# Patient Record
Sex: Female | Born: 1955
Health system: Southern US, Community
[De-identification: ages and names within clinical notes are randomized; demographics above are authoritative.]

## PROBLEM LIST (undated history)

## (undated) DIAGNOSIS — J40 Bronchitis, not specified as acute or chronic: Secondary | ICD-10-CM

## (undated) DIAGNOSIS — M1990 Unspecified osteoarthritis, unspecified site: Secondary | ICD-10-CM

## (undated) DIAGNOSIS — I10 Essential (primary) hypertension: Secondary | ICD-10-CM

## (undated) DIAGNOSIS — G4730 Sleep apnea, unspecified: Secondary | ICD-10-CM

## (undated) DIAGNOSIS — T7840XA Allergy, unspecified, initial encounter: Secondary | ICD-10-CM

## (undated) DIAGNOSIS — J45909 Unspecified asthma, uncomplicated: Secondary | ICD-10-CM

## (undated) DIAGNOSIS — F419 Anxiety disorder, unspecified: Secondary | ICD-10-CM

## (undated) DIAGNOSIS — K219 Gastro-esophageal reflux disease without esophagitis: Secondary | ICD-10-CM

## (undated) DIAGNOSIS — D649 Anemia, unspecified: Secondary | ICD-10-CM

## (undated) HISTORY — DX: Anemia, unspecified: D64.9

## (undated) HISTORY — PX: ABDOMINAL HYSTERECTOMY: SHX81

## (undated) HISTORY — PX: OTHER SURGICAL HISTORY: SHX169

## (undated) HISTORY — PX: BREAST SURGERY: SHX581

## (undated) HISTORY — PX: DILATION AND CURETTAGE OF UTERUS: SHX78

## (undated) HISTORY — PX: COLONOSCOPY: SHX174

## (undated) HISTORY — DX: Allergy, unspecified, initial encounter: T78.40XA

## (undated) HISTORY — PX: IUD REMOVAL: SHX5392

## (undated) HISTORY — PX: TONSILLECTOMY: SUR1361

## (undated) HISTORY — PX: CHOLECYSTECTOMY: SHX55

---

## 1996-02-26 HISTORY — PX: GASTRIC BYPASS: SHX52

## 2016-05-30 ENCOUNTER — Encounter (INDEPENDENT_AMBULATORY_CARE_PROVIDER_SITE_OTHER): Payer: Self-pay | Admitting: Physician Assistant

## 2016-05-30 ENCOUNTER — Ambulatory Visit (INDEPENDENT_AMBULATORY_CARE_PROVIDER_SITE_OTHER): Payer: Medicare Other | Admitting: Physician Assistant

## 2016-05-30 VITALS — BP 134/83 | HR 80 | Temp 98.0°F | Ht 60.24 in | Wt 194.2 lb

## 2016-05-30 DIAGNOSIS — F4310 Post-traumatic stress disorder, unspecified: Secondary | ICD-10-CM | POA: Insufficient documentation

## 2016-05-30 DIAGNOSIS — I10 Essential (primary) hypertension: Secondary | ICD-10-CM

## 2016-05-30 DIAGNOSIS — J3501 Chronic tonsillitis: Secondary | ICD-10-CM | POA: Insufficient documentation

## 2016-05-30 DIAGNOSIS — G8929 Other chronic pain: Secondary | ICD-10-CM

## 2016-05-30 DIAGNOSIS — Z9089 Acquired absence of other organs: Secondary | ICD-10-CM

## 2016-05-30 DIAGNOSIS — M542 Cervicalgia: Secondary | ICD-10-CM

## 2016-05-30 DIAGNOSIS — D563 Thalassemia minor: Secondary | ICD-10-CM | POA: Insufficient documentation

## 2016-05-30 DIAGNOSIS — G5600 Carpal tunnel syndrome, unspecified upper limb: Secondary | ICD-10-CM | POA: Insufficient documentation

## 2016-05-30 DIAGNOSIS — M25562 Pain in left knee: Secondary | ICD-10-CM | POA: Diagnosis not present

## 2016-05-30 DIAGNOSIS — E0500 Thyrotoxicosis with diffuse goiter without thyrotoxic crisis or storm: Secondary | ICD-10-CM

## 2016-05-30 DIAGNOSIS — R7303 Prediabetes: Secondary | ICD-10-CM | POA: Insufficient documentation

## 2016-05-30 DIAGNOSIS — M1712 Unilateral primary osteoarthritis, left knee: Secondary | ICD-10-CM | POA: Insufficient documentation

## 2016-05-30 DIAGNOSIS — M7582 Other shoulder lesions, left shoulder: Secondary | ICD-10-CM | POA: Insufficient documentation

## 2016-05-30 DIAGNOSIS — B351 Tinea unguium: Secondary | ICD-10-CM | POA: Insufficient documentation

## 2016-05-30 HISTORY — DX: Acquired absence of other organs: Z90.89

## 2016-05-30 HISTORY — DX: Thyrotoxicosis with diffuse goiter without thyrotoxic crisis or storm: E05.00

## 2016-05-30 MED ORDER — LAMOTRIGINE 25 MG PO TABS: 25.0000 mg | ORAL_TABLET | Freq: Every day | ORAL | 1 refills | Status: DC

## 2016-05-30 MED ORDER — GABAPENTIN 300 MG PO CAPS: 300.0000 mg | ORAL_CAPSULE | Freq: Three times a day (TID) | ORAL | 3 refills | Status: DC

## 2016-05-30 MED ORDER — LOSARTAN POTASSIUM-HCTZ 100-25 MG PO TABS: 1.0000 | ORAL_TABLET | Freq: Every day | ORAL | 1 refills | Status: DC

## 2016-05-30 MED ORDER — PROCHLORPERAZINE MALEATE 10 MG PO TABS: 10.0000 mg | ORAL_TABLET | Freq: Four times a day (QID) | ORAL | 3 refills | Status: DC | PRN

## 2016-05-30 NOTE — Progress Notes (Signed)
New patient presents with hypertension concerns and knee pain=3 Pt states she needs a shot for her neck pain, she can not take it anymore  Pt has taken her blood pressure medication today

## 2016-05-30 NOTE — Progress Notes (Signed)
Subjective:  Patient ID: Cheryl Prince, female    DOB: 09-30-1955  Age: 61 y.o. MRN: [id number]  CC: cervicalgia and medication refill.  HPI Cheryl Prince is a 61 y.o. female with presents with cervicalgia. She is new to our practice and complains of neck pain, left knee pain, and needs refill of medication for PTSD and HTN. In regards to neck pain, feels dull ache, sensitive to the touch, radiates to left and right shoulder. Feels as though something is "peeling" the back of her neck. Did physical therapy for the last 3.5 years with water aerobics which helps relieve her pain drastically. Tylenol helps, takes PRN. Has taken for the last 5 days. Previous imaging showed the following:  XR C spine 2014 mild straightneing of the normal cewrvical ordosisis may reflect muscle spasm, no fracture seen.  XR L spine 2014 DDD and hypertrophic degenerative changes. Spondylolisthesis at several levels   MRI 05/2013 Reversal of the normal cervical lordosis, mild dgernative changes, disc bulge c6-c7 with annular tear, disc bulge or herniation C4-C5; incompletely imaged.  In regards to PTSD, says her previous psychiatrist has prescribed many different medications but she has only responded to Lamictal. Would like a refill of Lamictal and her HTN medications. Denies CP, SOB, HA, abdominal pain, f/c/n/v, rash, GI/GU sxs, suicidal ideation/intent.   *Review of her previous notes shows Tdap 01/26/2014 Zoster 10/07/2014 Colonoscopy 08/2012 normal.   Review of Systems  Constitutional: Negative for chills, fever and malaise/fatigue.  Eyes: Negative for blurred vision.  Respiratory: Negative for shortness of breath.   Cardiovascular: Negative for chest pain and palpitations.  Gastrointestinal: Negative for abdominal pain and nausea.  Genitourinary: Negative for dysuria and hematuria.  Musculoskeletal: Positive for back pain, joint pain and neck pain. Negative for myalgias.  Skin: Negative for rash.   Neurological: Negative for tingling and headaches.  Psychiatric/Behavioral: Negative for depression. The patient is not nervous/anxious.        PTSD    Objective:  BP 134/83 (BP Location: Right Arm, Patient Position: Sitting, Cuff Size: Normal)   Pulse 80   Temp 98 F (36.7 C) (Oral)   Ht 5' 0.24" (1.53 m)   Wt 194 lb 3.2 oz (88.1 kg)   SpO2 100%   BMI 37.63 kg/m   BP/Weight 05/30/2016  Systolic BP 134  Diastolic BP 83  Wt. (Lbs) 194.2  BMI 37.63      Physical Exam  Constitutional: She is oriented to person, place, and time.  Well developed, overweight, NAD, polite  HENT:  Head: Normocephalic and atraumatic.  Eyes: No scleral icterus.  Neck: Normal range of motion. Neck supple. No thyromegaly present.  Cardiovascular: Normal rate, regular rhythm and normal heart sounds.   Pulmonary/Chest: Effort normal and breath sounds normal.  Musculoskeletal: She exhibits no edema.  Mild bony hypertrophic changes of the left knee. Left knee with full aROM.  Neurological: She is alert and oriented to person, place, and time. No cranial nerve deficit. Coordination normal.  Skin: Skin is warm and dry. No rash noted. No erythema. No pallor.  Psychiatric: She has a normal mood and affect. Her behavior is normal. Thought content normal.  Vitals reviewed.    Assessment & Plan:   1. Cervicalgia - Ambulatory referral to Orthopedic Surgery - gabapentin (NEURONTIN) 300 MG capsule; Take 1 capsule (300 mg total) by mouth 3 (three) times daily.  Dispense: 90 capsule; Refill: 3  2. Chronic pain of left knee - Ambulatory referral to Orthopedics  3. Hypertension, unspecified type -  losartan-hydrochlorothiazide (HYZAAR) 100-25 MG tablet; Take 1 tablet by mouth daily.  Dispense: 90 tablet; Refill: 1  4. PTSD (post-traumatic stress disorder) - prochlorperazine (COMPAZINE) 10 MG tablet; Take 1 tablet (10 mg total) by mouth every 6 (six) hours as needed for nausea or vomiting.  Dispense: 30  tablet; Refill: 3 - lamoTRIgine (LAMICTAL) 25 MG tablet; Take 1 tablet (25 mg total) by mouth daily.  Dispense: 90 tablet; Refill: 1 - Ambulatory referral to Psychiatry   Meds ordered this encounter  Medications  . prochlorperazine (COMPAZINE) 10 MG tablet    Sig: Take 1 tablet (10 mg total) by mouth every 6 (six) hours as needed for nausea or vomiting.    Dispense:  30 tablet    Refill:  3    Order Specific Question:   Supervising Provider    Answer:   Quentin Angst [id number]  . losartan-hydrochlorothiazide (HYZAAR) 100-25 MG tablet    Sig: Take 1 tablet by mouth daily.    Dispense:  90 tablet    Refill:  1    Order Specific Question:   Supervising Provider    Answer:   Quentin Angst [id number]  . lamoTRIgine (LAMICTAL) 25 MG tablet    Sig: Take 1 tablet (25 mg total) by mouth daily.    Dispense:  90 tablet    Refill:  1    Order Specific Question:   Supervising Provider    Answer:   Quentin Angst [id number]  . gabapentin (NEURONTIN) 300 MG capsule    Sig: Take 1 capsule (300 mg total) by mouth 3 (three) times daily.    Dispense:  90 capsule    Refill:  3    Order Specific Question:   Supervising Provider    Answer:   Quentin Angst [[id number]]    Follow-up: Return in about 4 weeks (around 06/27/2016) for full physical.   Loletta Specter PA

## 2016-05-30 NOTE — Patient Instructions (Signed)
Osteoarthritis  Osteoarthritis is a type of arthritis that affects tissue that covers the ends of bones in joints (cartilage). Cartilage acts as a cushion between the bones and helps them move smoothly. Osteoarthritis results when cartilage in the joints gets worn down. Osteoarthritis is sometimes called "wear and tear" arthritis.  Osteoarthritis is the most common form of arthritis. It often occurs in older people. It is a condition that gets worse over time (a progressive condition). Joints that are most often affected by this condition are in:  · Fingers.  · Toes.  · Hips.  · Knees.  · Spine, including neck and lower back.    What are the causes?  This condition is caused by age-related wearing down of cartilage that covers the ends of bones.  What increases the risk?  The following factors may make you more likely to develop this condition:  · Older age.  · Being overweight or obese.  · Overuse of joints, such as in athletes.  · Past injury of a joint.  · Past surgery on a joint.  · Family history of osteoarthritis.    What are the signs or symptoms?  The main symptoms of this condition are pain, swelling, and stiffness in the joint. The joint may lose its shape over time. Small pieces of bone or cartilage may break off and float inside of the joint, which may cause more pain and damage to the joint. Small deposits of bone (osteophytes) may grow on the edges of the joint. Other symptoms may include:  · A grating or scraping feeling inside the joint when you move it.  · Popping or creaking sounds when you move.    Symptoms may affect one or more joints. Osteoarthritis in a major joint, such as your knee or hip, can make it painful to walk or exercise. If you have osteoarthritis in your hands, you might not be able to grip items, twist your hand, or control small movements of your hands and fingers (fine motor skills).  How is this diagnosed?  This condition may be diagnosed based on:  · Your medical history.  · A  physical exam.  · Your symptoms.  · X-rays of the affected joint(s).  · Blood tests to rule out other types of arthritis.    How is this treated?  There is no cure for this condition, but treatment can help to control pain and improve joint function. Treatment plans may include:  · A prescribed exercise program that allows for rest and joint relief. You may work with a physical therapist.  · A weight control plan.  · Pain relief techniques, such as:  ? Applying heat and cold to the joint.  ? Electric pulses delivered to nerve endings under the skin (transcutaneous electrical nerve stimulation, or TENS).  ? Massage.  ? Certain nutritional supplements.  · NSAIDs or prescription medicines to help relieve pain.  · Medicine to help relieve pain and inflammation (corticosteroids). This can be given by mouth (orally) or as an injection.  · Assistive devices, such as a brace, wrap, splint, specialized glove, or cane.  · Surgery, such as:  ? An osteotomy. This is done to reposition the bones and relieve pain or to remove loose pieces of bone and cartilage.  ? Joint replacement surgery. You may need this surgery if you have very bad (advanced) osteoarthritis.    Follow these instructions at home:  Activity   · Rest your affected joints as directed by your   health care provider.  · Do not drive or use heavy machinery while taking prescription pain medicine.  · Exercise as directed. Your health care provider or physical therapist may recommend specific types of exercise, such as:  ? Strengthening exercises. These are done to strengthen the muscles that support joints that are affected by arthritis. They can be performed with weights or with exercise bands to add resistance.  ? Aerobic activities. These are exercises, such as brisk walking or water aerobics, that get your heart pumping.  ? Range-of-motion activities. These keep your joints easy to move.  ? Balance and agility exercises.  Managing pain, stiffness, and swelling    · If directed, apply heat to the affected area as often as told by your health care provider. Use the heat source that your health care provider recommends, such as a moist heat pack or a heating pad.  ? If you have a removable assistive device, remove it as told by your health care provider.  ? Place a towel between your skin and the heat source. If your health care provider tells you to keep the assistive device on while you apply heat, place a towel between the assistive device and the heat source.  ? Leave the heat on for 20-30 minutes.  ? Remove the heat if your skin turns bright red. This is especially important if you are unable to feel pain, heat, or cold. You may have a greater risk of getting burned.  · If directed, put ice on the affected joint:  ? If you have a removable assistive device, remove it as told by your health care provider.  ? Put ice in a plastic bag.  ? Place a towel between your skin and the bag. If your health care provider tells you to keep the assistive device on during icing, place a towel between the assistive device and the bag.  ? Leave the ice on for 20 minutes, 2-3 times a day.  General instructions   · Take over-the-counter and prescription medicines only as told by your health care provider.  · Maintain a healthy weight. Follow instructions from your health care provider for weight control. These may include dietary restrictions.  · Do not use any products that contain nicotine or tobacco, such as cigarettes and e-cigarettes. These can delay bone healing. If you need help quitting, ask your health care provider.  · Use assistive devices as directed by your health care provider.  · Keep all follow-up visits as told by your health care provider. This is important.  Where to find more information:  · National Institute of Arthritis and Musculoskeletal and Skin Diseases: www.niams.nih.gov  · National Institute on Aging: www.nia.nih.gov  · American College of Rheumatology:  www.rheumatology.org  Contact a health care provider if:  · Your skin turns red.  · You develop a rash.  · You have pain that gets worse.  · You have a fever along with joint or muscle aches.  Get help right away if:  · You lose a lot of weight.  · You suddenly lose your appetite.  · You have night sweats.  Summary  · Osteoarthritis is a type of arthritis that affects tissue covering the ends of bones in joints (cartilage).  · This condition is caused by age-related wearing down of cartilage that covers the ends of bones.  · The main symptom of this condition is pain, swelling, and stiffness in the joint.  · There is no cure for this   condition, but treatment can help to control pain and improve joint function.  This information is not intended to replace advice given to you by your health care provider. Make sure you discuss any questions you have with your health care provider.  Document Released: 02/11/2005 Document Revised: 10/16/2015 Document Reviewed: 10/16/2015  Elsevier Interactive Patient Education © 2017 [employer]

## 2016-06-07 ENCOUNTER — Ambulatory Visit (INDEPENDENT_AMBULATORY_CARE_PROVIDER_SITE_OTHER): Payer: Medicare Other | Admitting: Orthopedic Surgery

## 2016-06-07 ENCOUNTER — Encounter (INDEPENDENT_AMBULATORY_CARE_PROVIDER_SITE_OTHER): Payer: Self-pay | Admitting: Orthopedic Surgery

## 2016-06-07 DIAGNOSIS — M25512 Pain in left shoulder: Secondary | ICD-10-CM | POA: Diagnosis not present

## 2016-06-07 DIAGNOSIS — M1712 Unilateral primary osteoarthritis, left knee: Secondary | ICD-10-CM

## 2016-06-07 DIAGNOSIS — G8929 Other chronic pain: Secondary | ICD-10-CM

## 2016-06-07 DIAGNOSIS — M542 Cervicalgia: Secondary | ICD-10-CM | POA: Diagnosis not present
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
## 2016-06-18 ENCOUNTER — Telehealth (INDEPENDENT_AMBULATORY_CARE_PROVIDER_SITE_OTHER): Payer: Self-pay | Admitting: Physician Assistant

## 2016-06-18 NOTE — Telephone Encounter (Signed)
I have filled out the paperwork for the handicap placard. Please call patient to pick up form.

## 2016-06-18 NOTE — Telephone Encounter (Signed)
FWD to PCP. Tempestt S Roberts, CMA  

## 2016-06-18 NOTE — Telephone Encounter (Signed)
Patient dropped off DMV form to be filled out. Please call patient with any question or when ready

## 2016-06-19 NOTE — Telephone Encounter (Signed)
Patient informed. Adaleena Mooers S Hulda Reddix, CMA  

## 2016-06-24 ENCOUNTER — Encounter (INDEPENDENT_AMBULATORY_CARE_PROVIDER_SITE_OTHER): Payer: Medicare Other | Admitting: Physical Medicine and Rehabilitation

## 2016-06-26 ENCOUNTER — Ambulatory Visit (INDEPENDENT_AMBULATORY_CARE_PROVIDER_SITE_OTHER): Payer: Medicare Other

## 2016-06-26 ENCOUNTER — Ambulatory Visit (INDEPENDENT_AMBULATORY_CARE_PROVIDER_SITE_OTHER): Payer: Medicare Other | Admitting: Physical Medicine and Rehabilitation

## 2016-06-26 ENCOUNTER — Encounter (INDEPENDENT_AMBULATORY_CARE_PROVIDER_SITE_OTHER): Payer: Self-pay | Admitting: Physical Medicine and Rehabilitation

## 2016-06-26 VITALS — BP 119/78 | HR 72

## 2016-06-26 DIAGNOSIS — M5412 Radiculopathy, cervical region: Secondary | ICD-10-CM

## 2016-06-26 MED ORDER — LIDOCAINE HCL (PF) 1 % IJ SOLN
2.0000 mL | Freq: Once | INTRAMUSCULAR | Status: AC
  Administered 2016-06-26: 2 mL

## 2016-06-26 MED ORDER — IIOPAMIDOL (ISOVUE-250) INJECTION 51%
3.0000 mL | Freq: Once | INTRAVENOUS | Status: AC
  Administered 2016-06-26: 3 mL
  Filled 2016-06-26: qty 50

## 2016-06-26 MED ORDER — METHYLPREDNISOLONE ACETATE 80 MG/ML IJ SUSP
80.0000 mg | Freq: Once | INTRAMUSCULAR | Status: AC
  Administered 2016-06-26: 80 mg

## 2016-06-26 NOTE — Progress Notes (Signed)
Cheryl Prince - 60 y.o. female MRN [id number]  Date of birth: 07/01/1955  Office Visit Note: Visit Date: 06/26/2016 PCP: Loletta Specter, PA-C Referred by: Loletta Specter, PA-C  Subjective: Chief Complaint  Patient presents with  . Neck - Pain   HPI: Cheryl Prince is a very pleasant 60 year old female with multiple medical problems and multiple orthopedic complaints who is seeing Dr. August Saucer mainly for her knees but is been having some neck and shoulder pain bilaterally. This is a chronic problem for her. She had an MRI from 2015 which is reviewed below. She is status post bilateral carpal tunnel release. Electrodiagnostic studies did not reveal overt radiculopathy of the time.    ROS Otherwise per HPI.  Assessment & Plan: Visit Diagnoses:  1. Cervical radiculopathy     Plan: Findings:  Left C7-T1 intralaminar epidural steroid injection. The patient had mild anterior chest pain at about the midpoint of putting in the injectate. We did slow the injectate at that point and she did extremely well after that.    Meds & Orders:  Meds ordered this encounter  Medications  . lidocaine (PF) (XYLOCAINE) 1 % injection 2 mL  . iopamidol (ISOVUE-250) 51 % injection 3 mL  . methylPREDNISolone acetate (DEPO-MEDROL) injection 80 mg    Orders Placed This Encounter  Procedures  . XR C-ARM NO REPORT  . Epidural Steroid injection    Follow-up: Return if symptoms worsen or fail to improve, for Dr. August Saucer.   Procedures: No procedures performed  Cervical Epidural Steroid Injection - Interlaminar Approach with Fluoroscopic Guidance  Patient: Cheryl Prince      Date of Birth: May 26, 1955 MRN: [id number] PCP: Loletta Specter, PA-C      Visit Date: 06/26/2016   Universal Protocol:    Date/Time: 06/27/1810:59 PM  Consent Given By: the patient  Position: PRONE  Additional Comments: Vital signs were monitored before and after the procedure. Patient was prepped and draped in the  usual sterile fashion. The correct patient, procedure, and site was verified.   Injection Procedure Details:  Procedure Site One Meds Administered: No orders of the defined types were placed in this encounter.    Laterality: Left  Location/Site:  C7-T1  Needle size: 20 G  Needle type: Touhy  Needle Placement: Paramedian epidural space  Findings:  -Contrast Used: 1 mL iohexol 180 mg iodine/mL   -Comments: Excellent flow of contrast into the epidural space.  Procedure Details: Using a paramedian approach from the side mentioned above, the region overlying the inferior lamina was localized under fluoroscopic visualization and the soft tissues overlying this structure were infiltrated with 4 ml. of 1% Lidocaine without Epinephrine. A # 20 gauge, Tuohy needle was inserted into the epidural space using a paramedian approach.  The epidural space was localized using loss of resistance along with lateral and contralateral oblique bi-planar fluoroscopic views.  After negative aspirate for air, blood, and CSF, a 2 ml. volume of Isovue-250 was injected into the epidural space and the flow of contrast was observed. Radiographs were obtained for documentation purposes.   The injectate was administered into the level noted above.  Additional Comments:  The patient tolerated the procedure well Dressing: Band-Aid    Post-procedure details: Patient was observed during the procedure. Post-procedure instructions were reviewed.  Patient left the clinic in stable condition.      Clinical History: Cervical spine MRI dated 06/03/2013 completed in [place] By report this shows reversal of normal lordosis. It C2-3 there is right-sided  facet joint hypertrophy and arthropathy. At C 4-5 there is more left-sided facet joint hypertrophy. At C6-7 there is disc annular tear but no focal stenosis or nerve compression.  She reports that she has never smoked. She has never used smokeless tobacco. No  results for input(s): HGBA1C, LABURIC in the last 8760 hours.  Objective:  VS:  HT:    WT:   BMI:     BP:119/78  HR:72bpm  TEMP: ( )  RESP:99 % Physical Exam  Musculoskeletal:  Patient has painful range of motion with extension rotation with a negative Spurling's test bilaterally and negative Hoffmann's test bilaterally. She has good distal strength in the hands bilaterally.    Ortho Exam Imaging: Xr C-arm No Report  Result Date: 06/26/2016 Please see Notes or Procedures tab for imaging impression.   Past Medical/Family/Surgical/Social History: Medications & Allergies reviewed per EMR Patient Active Problem List   Diagnosis Date Noted  . PTSD (post-traumatic stress disorder) 05/30/2016  . Alpha thalassemia trait 05/30/2016  . Hypertension 05/30/2016  . Rotator cuff tendonitis, left 05/30/2016  . Osteoarthritis of left knee 05/30/2016  . Carpal tunnel syndrome 05/30/2016  . Prediabetes 05/30/2016  . Onychomycosis 05/30/2016  . S/P tonsillectomy 05/30/2016  . Goiter, toxic diffuse 05/30/2016  . Chronic tonsillitis 05/30/2016   No past medical history on file. No family history on file. No past surgical history on file. Social History   Occupational History  . Not on file.   Social History Main Topics  . Smoking status: Never Smoker  . Smokeless tobacco: Never Used  . Alcohol use Not on file  . Drug use: Unknown  . Sexual activity: Not on file

## 2016-06-26 NOTE — Progress Notes (Deleted)
Neck and left shoulder pain for several years. Constant pain. Spasms. Numbness in fingers. Wakes with pain. Feels like head is too heavy for her neck.

## 2016-06-26 NOTE — Procedures (Signed)
Cervical Epidural Steroid Injection - Interlaminar Approach with Fluoroscopic Guidance  Patient: Cheryl Prince      Date of Birth: 07-May-1955 MRN: [id number] PCP: Loletta Specter, PA-C      Visit Date: 06/26/2016   Universal Protocol:    Date/Time: 06/27/1810:59 PM  Consent Given By: the patient  Position: PRONE  Additional Comments: Vital signs were monitored before and after the procedure. Patient was prepped and draped in the usual sterile fashion. The correct patient, procedure, and site was verified.   Injection Procedure Details:  Procedure Site One Meds Administered: No orders of the defined types were placed in this encounter.    Laterality: Left  Location/Site:  C7-T1  Needle size: 20 G  Needle type: Touhy  Needle Placement: Paramedian epidural space  Findings:  -Contrast Used: 1 mL iohexol 180 mg iodine/mL   -Comments: Excellent flow of contrast into the epidural space.  Procedure Details: Using a paramedian approach from the side mentioned above, the region overlying the inferior lamina was localized under fluoroscopic visualization and the soft tissues overlying this structure were infiltrated with 4 ml. of 1% Lidocaine without Epinephrine. A # 20 gauge, Tuohy needle was inserted into the epidural space using a paramedian approach.  The epidural space was localized using loss of resistance along with lateral and contralateral oblique bi-planar fluoroscopic views.  After negative aspirate for air, blood, and CSF, a 2 ml. volume of Isovue-250 was injected into the epidural space and the flow of contrast was observed. Radiographs were obtained for documentation purposes.   The injectate was administered into the level noted above.  Additional Comments:  The patient tolerated the procedure well Dressing: Band-Aid    Post-procedure details: Patient was observed during the procedure. Post-procedure instructions were reviewed.  Patient left the clinic  in stable condition.

## 2016-06-26 NOTE — Patient Instructions (Signed)

## 2016-07-05 ENCOUNTER — Telehealth (INDEPENDENT_AMBULATORY_CARE_PROVIDER_SITE_OTHER): Payer: Self-pay | Admitting: Physician Assistant
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
## 2016-07-08 NOTE — Telephone Encounter (Signed)
Left message for patient with details that letter will not be given. Maryjean Mornempestt S Roberts, CMA

## 2016-07-08 NOTE — Telephone Encounter (Signed)
FWD to PCP. Cheryl Prince S Cheryl Prince, CMA  
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
## 2016-07-18 ENCOUNTER — Ambulatory Visit (HOSPITAL_COMMUNITY): Payer: Medicare Other | Admitting: Psychiatry

## 2016-08-05 ENCOUNTER — Ambulatory Visit (HOSPITAL_COMMUNITY): Payer: Medicare Other | Admitting: Psychiatry

## 2016-10-01 ENCOUNTER — Encounter (INDEPENDENT_AMBULATORY_CARE_PROVIDER_SITE_OTHER): Payer: Self-pay

## 2016-10-01 ENCOUNTER — Ambulatory Visit (INDEPENDENT_AMBULATORY_CARE_PROVIDER_SITE_OTHER): Payer: Medicare Other | Admitting: Psychiatry

## 2016-10-01 ENCOUNTER — Encounter (HOSPITAL_COMMUNITY): Payer: Self-pay | Admitting: Psychiatry

## 2016-10-01 VITALS — BP 137/76 | HR 72 | Ht 61.75 in | Wt 203.3 lb

## 2016-10-01 DIAGNOSIS — F4310 Post-traumatic stress disorder, unspecified: Secondary | ICD-10-CM

## 2016-10-01 DIAGNOSIS — F1010 Alcohol abuse, uncomplicated: Secondary | ICD-10-CM | POA: Diagnosis not present

## 2016-10-01 DIAGNOSIS — F319 Bipolar disorder, unspecified: Secondary | ICD-10-CM | POA: Diagnosis not present

## 2016-10-01 DIAGNOSIS — F1910 Other psychoactive substance abuse, uncomplicated: Secondary | ICD-10-CM

## 2016-10-01 DIAGNOSIS — Z818 Family history of other mental and behavioral disorders: Secondary | ICD-10-CM | POA: Diagnosis not present

## 2016-10-01 DIAGNOSIS — G4700 Insomnia, unspecified: Secondary | ICD-10-CM | POA: Diagnosis not present

## 2016-10-01 MED ORDER — HYDROXYZINE PAMOATE 25 MG PO CAPS: 25.0000 mg | ORAL_CAPSULE | Freq: Every evening | ORAL | 0 refills | Status: DC | PRN

## 2016-10-01 MED ORDER — NALTREXONE HCL 50 MG PO TABS: 50.0000 mg | ORAL_TABLET | ORAL | 0 refills | Status: DC

## 2016-10-01 MED ORDER — LAMOTRIGINE 25 MG PO TABS: ORAL_TABLET | ORAL | 0 refills | Status: DC
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
## 2016-10-09 ENCOUNTER — Ambulatory Visit (HOSPITAL_COMMUNITY)
Admission: EM | Admit: 2016-10-09 | Discharge: 2016-10-09 | Disposition: A | Discharge status: Home / Self Care | Payer: Medicare Other | Attending: Family Medicine | Admitting: Family Medicine

## 2016-10-09 ENCOUNTER — Encounter (HOSPITAL_COMMUNITY): Payer: Self-pay | Admitting: Emergency Medicine

## 2016-10-09 DIAGNOSIS — J0100 Acute maxillary sinusitis, unspecified: Secondary | ICD-10-CM

## 2016-10-09 DIAGNOSIS — R05 Cough: Secondary | ICD-10-CM | POA: Diagnosis not present

## 2016-10-09 HISTORY — DX: Bronchitis, not specified as acute or chronic: J40

## 2016-10-09 MED ORDER — FLUTICASONE PROPIONATE 50 MCG/ACT NA SUSP: 1.0000 | Freq: Every day | NASAL | 5 refills | Status: DC

## 2016-10-09 MED ORDER — DOXYCYCLINE HYCLATE 100 MG PO TABS: 100.0000 mg | ORAL_TABLET | Freq: Two times a day (BID) | ORAL | 0 refills | Status: DC

## 2016-10-09 NOTE — ED Provider Notes (Signed)
[facility]    CSN: [id number] Arrival date & time: 10/09/16  1049     History   Chief Complaint Chief Complaint  Patient presents with  . Cough    HPI Cheryl Prince is a 61 y.o. female.   HPI   URI  Has been sick for about 1 week. Started out with congestion and maxillary sinus tenderness. Had sore throat about 4 days. Patient now has cough and congestion, noted some phelgm. Patient states that her tmax was 101 on Saturday.  Patient was feeling pretty good yesterday. However feels like she had cough and congestion start last night. Patient states that right ear was hurting at one point earlier last week, now improved  Medications tried: Albuterol, cold medicine, tylenol, chest vicks, humdifier  Sick contacts: None  No hx of asthma, COPD, smoked till 1986   ROS see HPI Smoking Status noted    Past Medical History:  Diagnosis Date  . Bronchitis     Patient Active Problem List   Diagnosis Date Noted  . PTSD (post-traumatic stress disorder) 05/30/2016  . Alpha thalassemia trait 05/30/2016  . Hypertension 05/30/2016  . Rotator cuff tendonitis, left 05/30/2016  . Osteoarthritis of left knee 05/30/2016  . Carpal tunnel syndrome 05/30/2016  . Prediabetes 05/30/2016  . Onychomycosis 05/30/2016  . S/P tonsillectomy 05/30/2016  . Goiter, toxic diffuse 05/30/2016  . Chronic tonsillitis 05/30/2016    Past Surgical History:  Procedure Laterality Date  . ABDOMINAL HYSTERECTOMY    . BREAST SURGERY    . CHOLECYSTECTOMY      OB History    No data available       Home Medications    Prior to Admission medications   Medication Sig Start Date End Date Taking? Authorizing Provider  doxycycline (VIBRA-TABS) 100 MG tablet Take 1 tablet (100 mg total) by mouth 2 (two) times daily. 10/09/16   Saniya Tranchina, Antionette PolesAsiyah Zahra, MD  FLOVENT HFA 110 MCG/ACT inhaler INL 2 PUFFS PO BID 07/16/16   [provider]  fluticasone (FLONASE) 50 MCG/ACT nasal spray Place 1  spray into both nostrils daily. 1 spray in each nostril every day 10/09/16   Berton BonMikell, Shantasia Hunnell Zahra, MD  gabapentin (NEURONTIN) 300 MG capsule Take 1 capsule (300 mg total) by mouth 3 (three) times daily. 05/30/16   Loletta SpecterGomez, Roger David, PA-C  hydrOXYzine (VISTARIL) 25 MG capsule Take 1 capsule (25 mg total) by mouth at bedtime and may repeat dose one time if needed. 10/01/16   Arfeen, Phillips GroutSyed T, MD  lamoTRIgine (LAMICTAL) 25 MG tablet Take 2 tab daily for 2 weeks and than 3 tab daily 10/01/16   Arfeen, Phillips GroutSyed T, MD  losartan-hydrochlorothiazide (HYZAAR) 100-25 MG tablet Take 1 tablet by mouth daily. 05/30/16   Loletta SpecterGomez, Roger David, PA-C  naltrexone (DEPADE) 50 MG tablet Take 1 tablet (50 mg total) by mouth every other day. 10/01/16   Arfeen, Phillips GroutSyed T, MD  prochlorperazine (COMPAZINE) 10 MG tablet Take 1 tablet (10 mg total) by mouth every 6 (six) hours as needed for nausea or vomiting. 05/30/16   Loletta SpecterGomez, Roger David, PA-C    Family History No family history on file.  Social History Social History  Substance Use Topics  . Smoking status: Never Smoker  . Smokeless tobacco: Never Used  . Alcohol use Yes     Allergies   Morphine and related; Penicillins; Grapeseed extract [nutritional supplements]; and Sulfites   Review of Systems Review of Systems  Constitutional: Positive for chills and fever.  HENT:  Positive for congestion, ear pain, sinus pain and sore throat.   Eyes: Negative for discharge and itching.  Respiratory: Positive for cough. Negative for shortness of breath and wheezing.   Cardiovascular: Negative for chest pain and palpitations.  Gastrointestinal: Negative for abdominal pain.     Physical Exam Triage Vital Signs ED Triage Vitals  Enc Vitals Group     BP 10/09/16 1106 129/84     Pulse Rate 10/09/16 1106 62     Resp 10/09/16 1106 16     Temp 10/09/16 1106 98.7 F (37.1 C)     Temp Source 10/09/16 1106 Oral     SpO2 10/09/16 1106 99 %     Weight 10/09/16 1104 200 lb (90.7 kg)      Height 10/09/16 1104 5\' 1"  (1.549 m)     Head Circumference --      Peak Flow --      Pain Score 10/09/16 1104 3     Pain Loc --      Pain Edu? --      Excl. in GC? --    No data found.   Updated Vital Signs BP 129/84   Pulse 62   Temp 98.7 F (37.1 C) (Oral)   Resp 16   Ht 5\' 1"  (1.549 m)   Wt 200 lb (90.7 kg)   SpO2 99%   BMI 37.79 kg/m    Physical Exam  Constitutional: She is oriented to person, place, and time. She appears well-developed and well-nourished.  HENT:  Nasal congestion, maxillary tenderness on right side, postnasal drip, positive for right-sided lymphadenopathy,  Eyes: Pupils are equal, round, and reactive to light. Conjunctivae are normal.  Neck: Normal range of motion. Neck supple.  Cardiovascular: Normal rate, regular rhythm and normal heart sounds.   Pulmonary/Chest: Effort normal and breath sounds normal.  Abdominal: Soft. Bowel sounds are normal.  Musculoskeletal: Normal range of motion.  Neurological: She is alert and oriented to person, place, and time.  Skin: Skin is warm and dry.  Psychiatric: She has a normal mood and affect.     UC Treatments / Results  Labs (all labs ordered are listed, but only abnormal results are displayed) Labs Reviewed - No data to display  EKG  EKG Interpretation None       Radiology No results found.  Procedures Procedures (including critical care time)  Medications Ordered in UC Medications - No data to display   Initial Impression / Assessment and Plan / UC Course  I have reviewed the triage vital signs and the nursing notes.  Pertinent labs & imaging results that were available during my care of the patient were reviewed by me and considered in my medical decision making (see chart for details).   Acute sinusitis with history of MAXIMUM TEMPERATURE fever of 101, 2 weeks of symptoms, recently worsening. Will treat for bacterial sinusitis with doxycycline, as allergic to penicillins,. We'll also  provide Flonase for symptomatically treated  Final Clinical Impressions(s) / UC Diagnoses   Final diagnoses:  Acute maxillary sinusitis, recurrence not specified    New Prescriptions New Prescriptions   DOXYCYCLINE (VIBRA-TABS) 100 MG TABLET    Take 1 tablet (100 mg total) by mouth 2 (two) times daily.   FLUTICASONE (FLONASE) 50 MCG/ACT NASAL SPRAY    Place 1 spray into both nostrils daily. 1 spray in each nostril every day      Berton Bon, MD 10/09/16 1135

## 2016-10-09 NOTE — ED Triage Notes (Signed)
PT reports cough for 3 weeks that is now productive of dark yellow mucus. History of bronchitis. Headache for 4 days. Diarrhea for 2 days.

## 2016-10-09 NOTE — Discharge Instructions (Signed)
Please take doxycycline twice daily for acute sinusitis. Please also use Flonase to help with the congestion. Follow up as needed with PCP if you continue to have symptoms

## 2016-10-16 ENCOUNTER — Ambulatory Visit (INDEPENDENT_AMBULATORY_CARE_PROVIDER_SITE_OTHER): Payer: Medicare Other | Admitting: Orthopedic Surgery

## 2016-10-19 ENCOUNTER — Ambulatory Visit (INDEPENDENT_AMBULATORY_CARE_PROVIDER_SITE_OTHER): Payer: Medicare Other

## 2016-10-19 ENCOUNTER — Ambulatory Visit (HOSPITAL_COMMUNITY)
Admission: EM | Admit: 2016-10-19 | Discharge: 2016-10-19 | Disposition: A | Discharge status: Home / Self Care | Payer: Medicare Other | Attending: Family Medicine | Admitting: Family Medicine

## 2016-10-19 ENCOUNTER — Encounter (HOSPITAL_COMMUNITY): Payer: Self-pay | Admitting: Family Medicine

## 2016-10-19 DIAGNOSIS — S63502A Unspecified sprain of left wrist, initial encounter: Secondary | ICD-10-CM

## 2016-10-19 DIAGNOSIS — S5000XA Contusion of unspecified elbow, initial encounter: Secondary | ICD-10-CM

## 2016-10-19 DIAGNOSIS — S50312A Abrasion of left elbow, initial encounter: Secondary | ICD-10-CM | POA: Diagnosis not present

## 2016-10-19 MED ORDER — IBUPROFEN 800 MG PO TABS: 800.0000 mg | ORAL_TABLET | Freq: Three times a day (TID) | ORAL | 0 refills | Status: DC

## 2016-10-19 NOTE — ED Triage Notes (Signed)
Pt here for fall and injury to left arm.

## 2016-10-19 NOTE — ED Provider Notes (Signed)
  [facility]   [id number] 10/19/16 Arrival Time: 1733  ASSESSMENT & PLAN:  1. Abrasion of left elbow, initial encounter   2. Contusion of elbow, unspecified laterality, initial encounter   3. Sprain of left wrist, initial encounter     Meds ordered this encounter  Medications  . ibuprofen (ADVIL,MOTRIN) 800 MG tablet    Sig: Take 1 tablet (800 mg total) by mouth 3 (three) times daily.    Dispense:  21 tablet    Refill:  0    Order Specific Question:   Supervising Provider    Answer:   Mardella Layman [[id number]]    Reviewed expectations re: course of current medical issues. Questions answered. Outlined signs and symptoms indicating need for more acute intervention. Patient verbalized understanding. After Visit Summary given.   SUBJECTIVE:  Cheryl Prince is a 61 y.o. female who presents with complaint of falling and injuring left wrist and left elbow.  She has an abrasion.  ROS: As per HPI.   OBJECTIVE:  Vitals:   10/19/16 1750  BP: 119/68  Pulse: 90  Resp: 18  Temp: 98.3 F (36.8 C)  SpO2: 100%     General appearance: alert; no distress Eyes: PERRLA; EOMI; conjunctiva normal HENT: normocephalic; atraumatic; Lungs: clear to auscultation bilaterally Heart: regular rate and rhythm Extremities: Left elbow with abrasion and left hand with swelling and tenderness.  Left wrist w/o swelling but tender. Skin: warm and dry Neurologic: normal gait; normal symmetric reflexes Psychological: alert and cooperative; normal mood and affect  Past Medical History:  Diagnosis Date  . Bronchitis      has a past medical history of Bronchitis.  No results found for this or any previous visit.  Labs Reviewed - No data to display  Imaging: Dg Hand Complete Left  Result Date: 10/19/2016 CLINICAL DATA:  Fall wall playing basketball with left hand pain, initial encounter EXAM: LEFT HAND - COMPLETE 3+ VIEW COMPARISON:  None. FINDINGS: Considerable degenerative  changes are noted at the first [facility] joint. No acute fracture is noted. Dystrophic bony formation is noted about the joint. No acute fracture is seen. Scattered degenerative changes are noted throughout the interphalangeal joints. No gross soft tissue abnormality is noted. IMPRESSION: Degenerative change without acute abnormality. Electronically Signed   By: Alcide Clever M.D.   On: 10/19/2016 18:37    Allergies  Allergen Reactions  . Morphine And Related Swelling  . Penicillins Swelling  . Grapeseed Extract [Nutritional Supplements]   . Sulfites Rash    History reviewed. No pertinent family history. Past Surgical History:  Procedure Laterality Date  . ABDOMINAL HYSTERECTOMY    . BREAST SURGERY    . [address]           Deatra Canter, [place] 10/19/16 [place]

## 2016-10-30 ENCOUNTER — Encounter (INDEPENDENT_AMBULATORY_CARE_PROVIDER_SITE_OTHER): Payer: Self-pay | Admitting: Orthopedic Surgery

## 2016-10-30 ENCOUNTER — Ambulatory Visit (INDEPENDENT_AMBULATORY_CARE_PROVIDER_SITE_OTHER): Payer: Self-pay

## 2016-10-30 ENCOUNTER — Ambulatory Visit (INDEPENDENT_AMBULATORY_CARE_PROVIDER_SITE_OTHER): Payer: Medicare Other | Admitting: Orthopedic Surgery

## 2016-10-30 DIAGNOSIS — M25532 Pain in left wrist: Secondary | ICD-10-CM | POA: Diagnosis not present

## 2016-10-30 NOTE — Progress Notes (Signed)
Office Visit Note   Patient: Cheryl Prince           Date of Birth: [birth year]           MRN: [id number] Visit Date: 10/30/2016 Requested by: Loletta SpecterGomez, Roger David, PA-C [address] [place], [place] [id number] PCP: Denny LevyGomez, Roger David, PA-C  Subjective: Chief Complaint  Patient presents with  . Left Knee - Follow-up  . Neck - Follow-up    HPI: Cheryl Prince is a 61 year old patient with known left knee arthritis.  She states her knee is still very painful.  She reports swelling but no weakness or giving way.  It will wake her from sleep at night.  She does take Tylenol and ibuprofen as needed.  She did have C-spine injections with Dr. Kristeen MansKnutson which helped.  On August 25 she fell onto her left wrist.  She states the pronation and supination are painful.  She is going [place] to the [place] on [place] and would like for her knee to feel better before then.              ROS: All systems reviewed are negative as they relate to the chief complaint within the history of present illness.  Patient denies  fevers or chills.   Assessment & Plan: Visit Diagnoses:  1. Pain in left wrist     Plan: Impression is left knee pain with end-stage knee arthritis.  She does have a history of gel injections which have not helped.  Having she may benefit from a cortisone injection before she goes on her back, cruise.  We will set her up for a scheduled injection in late September.  In regards to the left wrist she does not have a wrist fracture or scaphoid fracture.  She does have significant CMC arthritis.  Her wait that out for now.  I'll see her back in September for clinical recheck and injection into the knee  Follow-Up Instructions: No Follow-up on file.   Orders:  Orders Placed This Encounter  Procedures  . XR Wrist Complete Left   No orders of the defined types were placed in this encounter.     Procedures: No procedures performed   Clinical Data: No additional  findings.  Objective: Vital Signs: There were no vitals taken for this visit.  Physical Exam:   Constitutional: Patient appears well-developed HEENT:  Head: Normocephalic Eyes:EOM are normal Neck: Normal range of motion Cardiovascular: Normal rate Pulmonary/chest: Effort normal Neurologic: Patient is alert Skin: Skin is warm Psychiatric: Patient has normal mood and affect    Ortho Exam: Orthopedic exam demonstrates left knee with no effusion medial joints line tenderness palpable pedal pulses stable collateral and cruciate ligaments intact extensor mechanism no other masses lymph adenopathy or skin changes noted in the knee region.  The wrist demonstrates good flexion and extension but definitely has tenderness around the [facility] joint with positive grind test.  Finger flexors and extensors are intact.  Wrist flexion-extension nontender or painful.  No subluxation of the EDC tendon or tenderness at the DRUJ noted with pronation supination.  Specialty Comments:  No specialty comments available.  Imaging: Xr Wrist Complete Left  Result Date: 10/30/2016 AP lateral scaphoid view left wrist reviewed.  Severe CMC arthritis is present.  No scaphoid fracture or wrist fracture visualized.  Mild degenerative changes present within the carpometacarpal articulation.    PMFS History: Patient Active Problem List   Diagnosis Date Noted  . PTSD (post-traumatic stress disorder) 05/30/2016  .  Alpha thalassemia trait 05/30/2016  . Hypertension 05/30/2016  . Rotator cuff tendonitis, left 05/30/2016  . Osteoarthritis of left knee 05/30/2016  . Carpal tunnel syndrome 05/30/2016  . Prediabetes 05/30/2016  . Onychomycosis 05/30/2016  . S/P tonsillectomy 05/30/2016  . Goiter, toxic diffuse 05/30/2016  . Chronic tonsillitis 05/30/2016   Past Medical History:  Diagnosis Date  . Bronchitis     No family history on file.  Past Surgical History:  Procedure Laterality Date  . ABDOMINAL HYSTERECTOMY     . BREAST SURGERY    . CHOLECYSTECTOMY     Social History   Occupational History  . Not on file.   Social History Main Topics  . Smoking status: Never Smoker  . Smokeless tobacco: Never Used  . Alcohol use Yes  . Drug use: Unknown  . Sexual activity: Not on file

## 2016-11-07 ENCOUNTER — Ambulatory Visit (HOSPITAL_COMMUNITY): Payer: Medicare Other | Admitting: Psychiatry

## 2016-11-12 ENCOUNTER — Telehealth (INDEPENDENT_AMBULATORY_CARE_PROVIDER_SITE_OTHER): Payer: Self-pay | Admitting: Physician Assistant

## 2016-11-12 NOTE — Telephone Encounter (Signed)
Pt came to the office to request a referral to a colonoscopy since her last one was more than 10 years also she want  A referral for mammogram as well, please follow up

## 2016-12-11 ENCOUNTER — Ambulatory Visit (INDEPENDENT_AMBULATORY_CARE_PROVIDER_SITE_OTHER): Payer: Medicare Other | Admitting: Physician Assistant

## 2016-12-11 ENCOUNTER — Encounter (INDEPENDENT_AMBULATORY_CARE_PROVIDER_SITE_OTHER): Payer: Self-pay | Admitting: Physician Assistant

## 2016-12-11 VITALS — BP 115/80 | HR 71 | Temp 97.9°F | Ht 61.42 in | Wt 199.0 lb

## 2016-12-11 DIAGNOSIS — Z1239 Encounter for other screening for malignant neoplasm of breast: Secondary | ICD-10-CM

## 2016-12-11 DIAGNOSIS — Z0000 Encounter for general adult medical examination without abnormal findings: Secondary | ICD-10-CM | POA: Diagnosis not present

## 2016-12-11 DIAGNOSIS — Z1231 Encounter for screening mammogram for malignant neoplasm of breast: Secondary | ICD-10-CM

## 2016-12-11 DIAGNOSIS — Z1211 Encounter for screening for malignant neoplasm of colon: Secondary | ICD-10-CM | POA: Diagnosis not present

## 2016-12-11 NOTE — Patient Instructions (Signed)
Mammogram A mammogram is an X-ray of the breasts that is done to check for abnormal changes. This procedure can screen for and detect any changes that may suggest breast cancer. A mammogram can also identify other changes and variations in the breast, such as:  Inflammation of the breast tissue (mastitis).  An infected area that contains a collection of pus (abscess).  A fluid-filled sac (cyst).  Fibrocystic changes. This is when breast tissue becomes denser, which can make the tissue feel rope-like or uneven under the skin.  Tumors that are not cancerous (benign).  Tell a health care provider about:  Any allergies you have.  If you have breast implants.  If you have had previous breast disease, biopsy, or surgery.  If you are breastfeeding.  Any possibility that you could be pregnant, if this applies.  If you are younger than age 25.  If you have a family history of breast cancer. What are the risks? Generally, this is a safe procedure. However, problems may occur, including:  Exposure to radiation. Radiation levels are very low with this test.  The results being misinterpreted.  The need for further tests.  The inability of the mammogram to detect certain cancers.  What happens before the procedure?  Schedule your test about 1-2 weeks after your menstrual period. This is usually when your breasts are the least tender.  If you have had a mammogram done at a different facility in the past, get the mammogram X-rays or have them sent to your current exam facility in order to compare them.  Wash your breasts and under your arms the day of the test.  Do not wear deodorants, perfumes, lotions, or powders anywhere on your body on the day of the test.  Remove any jewelry from your neck.  Wear clothes that you can change into and out of easily. What happens during the procedure?  You will undress from the waist up and put on a gown.  You will stand in front of the  X-ray machine.  Each breast will be placed between two plastic or glass plates. The plates will compress your breast for a few seconds. Try to stay as relaxed as possible during the procedure. This does not cause any harm to your breasts and any discomfort you feel will be very brief.  X-rays will be taken from different angles of each breast. The procedure may vary among health care providers and hospitals. What happens after the procedure?  The mammogram will be examined by a specialist (radiologist).  You may need to repeat certain parts of the test, depending on the quality of the images. This is commonly done if the radiologist needs a better view of the breast tissue.  Ask when your test results will be ready. Make sure you get your test results.  You may resume your normal activities. This information is not intended to replace advice given to you by your health care provider. Make sure you discuss any questions you have with your health care provider. Document Released: 02/09/2000 Document Revised: 07/17/2015 Document Reviewed: 04/22/2014 Elsevier Interactive Patient Education  2017 Elsevier Inc.  

## 2016-12-11 NOTE — Progress Notes (Signed)
Subjective:  Patient ID: Cheryl Prince, female    DOB: Mar 11, 1955  Age: 61 y.o. MRN: [id number]  CC: annual exam  HPI Cheryl Prince is a 61 y.o. female with a medical history of partial hysterectomy presents for an annual physical exam. Says she does not have a cervix and was told by her previous provider that she would not need to have PAP smears done. She reports being up to date on TDaP, Hep C screening, and HIV screening which were all done at an outside clinic. Declines Flu vaccine. Reports feeling well and exercises at the [facility] regularly. Does not endorse any other symptoms or complaints besides occasional back ache.        Outpatient Medications Prior to Visit  Medication Sig Dispense Refill  . FLOVENT HFA 110 MCG/ACT inhaler INL 2 PUFFS PO BID  1  . gabapentin (NEURONTIN) 300 MG capsule Take 1 capsule (300 mg total) by mouth 3 (three) times daily. 90 capsule 3  . ibuprofen (ADVIL,MOTRIN) 800 MG tablet Take 1 tablet (800 mg total) by mouth 3 (three) times daily. 21 tablet 0  . lamoTRIgine (LAMICTAL) 25 MG tablet Take 2 tab daily for 2 weeks and than 3 tab daily 90 tablet 0  . losartan-hydrochlorothiazide (HYZAAR) 100-25 MG tablet Take 1 tablet by mouth daily. 90 tablet 1  . doxycycline (VIBRA-TABS) 100 MG tablet Take 1 tablet (100 mg total) by mouth 2 (two) times daily. (Patient not taking: Reported on 12/11/2016) 20 tablet 0  . fluticasone (FLONASE) 50 MCG/ACT nasal spray Place 1 spray into both nostrils daily. 1 spray in each nostril every day (Patient not taking: Reported on 12/11/2016) 16 g 5  . hydrOXYzine (VISTARIL) 25 MG capsule Take 1 capsule (25 mg total) by mouth at bedtime and may repeat dose one time if needed. (Patient not taking: Reported on 12/11/2016) 30 capsule 0  . naltrexone (DEPADE) 50 MG tablet Take 1 tablet (50 mg total) by mouth every other day. (Patient not taking: Reported on 12/11/2016) 30 tablet 0  . prochlorperazine (COMPAZINE) 10 MG tablet Take 1  tablet (10 mg total) by mouth every 6 (six) hours as needed for nausea or vomiting. (Patient not taking: Reported on 12/11/2016) 30 tablet 3   No facility-administered medications prior to visit.      ROS Review of Systems  Constitutional: Negative for chills, fever and malaise/fatigue.  Eyes: Negative for blurred vision.  Respiratory: Negative for shortness of breath.   Cardiovascular: Negative for chest pain and palpitations.  Gastrointestinal: Negative for abdominal pain and nausea.  Genitourinary: Negative for dysuria and hematuria.  Musculoskeletal: Positive for back pain (occasional). Negative for joint pain and myalgias.  Skin: Negative for rash.  Neurological: Negative for tingling and headaches.  Psychiatric/Behavioral: Negative for depression. The patient is not nervous/anxious.     Objective:  BP 115/80 (BP Location: Right Arm, Patient Position: Sitting, Cuff Size: Normal)   Pulse 71   Temp 97.9 F (36.6 C) (Oral)   Ht 5' 1.42" (1.56 m)   Wt 199 lb (90.3 kg)   SpO2 99%   BMI 37.09 kg/m   BP/Weight 12/11/2016 10/19/2016 10/09/2016  Systolic BP 115 119 129  Diastolic BP 80 68 84  Wt. (Lbs) 199 - 200  BMI 37.09 - 37.79  Some encounter information is confidential and restricted. Go to Review Flowsheets activity to see all data.      Physical Exam  Constitutional: She is oriented to person, place, and time.  Well developed, well  nourished, NAD, polite  HENT:  Head: Normocephalic and atraumatic.  Eyes: No scleral icterus.  Neck: Normal range of motion. Neck supple. No thyromegaly present.  Cardiovascular: Normal rate, regular rhythm and normal heart sounds.   Pulmonary/Chest: Effort normal and breath sounds normal. No respiratory distress. She has no wheezes. She has no rales.  Abdominal: Soft. Bowel sounds are normal. She exhibits no distension. There is no tenderness. There is no rebound and no guarding.  Musculoskeletal: She exhibits no edema.  Full aROM of  Back, UEs, and LEs.  Lymphadenopathy:    She has no cervical adenopathy.  Neurological: She is alert and oriented to person, place, and time. She has normal reflexes. No cranial nerve deficit. Coordination normal.  Skin: Skin is warm and dry. No rash noted. No erythema. No pallor.  Psychiatric: She has a normal mood and affect. Her behavior is normal. Thought content normal.  Vitals reviewed.    Assessment & Plan:   1. Annual physical exam - CBC with Differential - Comprehensive metabolic panel - Lipid Panel - TSH  2. Screen for colon cancer - Fecal occult blood, imunochemical  3. Screening for breast cancer - MS digital screening mammogram bilateral    Follow-up: Return if symptoms worsen or fail to improve.   Loletta Specteroger David Haadi Santellan PA

## 2016-12-12 ENCOUNTER — Telehealth: Payer: Self-pay

## 2016-12-12 LAB — CBC WITH DIFFERENTIAL/PLATELET
BASOS: 2 %
Basophils Absolute: 0.1 10*3/uL (ref 0.0–0.2)
EOS (ABSOLUTE): 0 10*3/uL (ref 0.0–0.4)
EOS: 1 %
HEMATOCRIT: 34.8 % (ref 34.0–46.6)
HEMOGLOBIN: 10.8 g/dL — AB (ref 11.1–15.9)
IMMATURE GRANS (ABS): 0 10*3/uL (ref 0.0–0.1)
IMMATURE GRANULOCYTES: 0 %
Lymphocytes Absolute: 1.4 10*3/uL (ref 0.7–3.1)
Lymphs: 46 %
MCH: 23.6 pg — ABNORMAL LOW (ref 26.6–33.0)
MCHC: 31 g/dL — ABNORMAL LOW (ref 31.5–35.7)
MCV: 76 fL — ABNORMAL LOW (ref 79–97)
MONOS ABS: 0.2 10*3/uL (ref 0.1–0.9)
Monocytes: 8 %
Neutrophils Absolute: 1.2 10*3/uL — ABNORMAL LOW (ref 1.4–7.0)
Neutrophils: 43 %
Platelets: 349 10*3/uL (ref 150–379)
RBC: 4.58 x10E6/uL (ref 3.77–5.28)
RDW: 16.4 % — AB (ref 12.3–15.4)
WBC: 2.9 10*3/uL — ABNORMAL LOW (ref 3.4–10.8)

## 2016-12-12 LAB — COMPREHENSIVE METABOLIC PANEL
ALT: 28 IU/L (ref 0–32)
AST: 31 IU/L (ref 0–40)
Albumin/Globulin Ratio: 1.2 (ref 1.2–2.2)
Albumin: 3.9 g/dL (ref 3.6–4.8)
Alkaline Phosphatase: 124 IU/L — ABNORMAL HIGH (ref 39–117)
BUN/Creatinine Ratio: 11 — ABNORMAL LOW (ref 12–28)
BUN: 12 mg/dL (ref 8–27)
Bilirubin Total: 0.7 mg/dL (ref 0.0–1.2)
CALCIUM: 9.2 mg/dL (ref 8.7–10.3)
CO2: 25 mmol/L (ref 20–29)
CREATININE: 1.06 mg/dL — AB (ref 0.57–1.00)
Chloride: 104 mmol/L (ref 96–106)
GFR, EST AFRICAN AMERICAN: 65 mL/min/{1.73_m2} (ref >59)
GFR, EST NON AFRICAN AMERICAN: 56 mL/min/{1.73_m2} — AB (ref >59)
GLOBULIN, TOTAL: 3.3 g/dL (ref 1.5–4.5)
Glucose: 84 mg/dL (ref 65–99)
Potassium: 3.2 mmol/L — ABNORMAL LOW (ref 3.5–5.2)
SODIUM: 141 mmol/L (ref 134–144)
TOTAL PROTEIN: 7.2 g/dL (ref 6.0–8.5)

## 2016-12-12 LAB — TSH: TSH: 1.21 u[IU]/mL (ref 0.450–4.500)

## 2016-12-12 LAB — LIPID PANEL
CHOL/HDL RATIO: 2.6 ratio (ref 0.0–4.4)
Cholesterol, Total: 142 mg/dL (ref 100–199)
HDL: 55 mg/dL (ref >39)
LDL Calculated: 70 mg/dL (ref 0–99)
TRIGLYCERIDES: 87 mg/dL (ref 0–149)
VLDL Cholesterol Cal: 17 mg/dL (ref 5–40)

## 2016-12-12 NOTE — Telephone Encounter (Signed)
Left message for patient informing of normal thyroid and cholesterol, mild anemia, to get OTC gentle Iron for anemia and mildly impaired kidney that we will retest at next appointment. Maryjean Mornempestt S Roberts, CMA

## 2016-12-12 NOTE — Telephone Encounter (Signed)
-----   Message from Loletta Specteroger David Gomez, PA-C sent at 12/12/2016 12:30 PM EDT ----- Thyroid and cholesterol negative. Has mild anemia. Please use OTC Gentle Iron pills. Mild impairment with the kidneys. Will retest at next appointment.

## 2016-12-17 ENCOUNTER — Encounter (HOSPITAL_COMMUNITY): Payer: Self-pay | Admitting: Psychiatry

## 2016-12-17 ENCOUNTER — Ambulatory Visit (INDEPENDENT_AMBULATORY_CARE_PROVIDER_SITE_OTHER): Payer: Medicare Other | Admitting: Psychiatry

## 2016-12-17 DIAGNOSIS — F319 Bipolar disorder, unspecified: Secondary | ICD-10-CM

## 2016-12-17 DIAGNOSIS — M549 Dorsalgia, unspecified: Secondary | ICD-10-CM | POA: Diagnosis not present

## 2016-12-17 DIAGNOSIS — R4586 Emotional lability: Secondary | ICD-10-CM | POA: Diagnosis not present

## 2016-12-17 DIAGNOSIS — F39 Unspecified mood [affective] disorder: Secondary | ICD-10-CM | POA: Diagnosis not present

## 2016-12-17 DIAGNOSIS — M2550 Pain in unspecified joint: Secondary | ICD-10-CM | POA: Diagnosis not present

## 2016-12-17 DIAGNOSIS — F1010 Alcohol abuse, uncomplicated: Secondary | ICD-10-CM

## 2016-12-17 DIAGNOSIS — R454 Irritability and anger: Secondary | ICD-10-CM

## 2016-12-17 DIAGNOSIS — R443 Hallucinations, unspecified: Secondary | ICD-10-CM

## 2016-12-17 MED ORDER — LAMOTRIGINE 25 MG PO TABS: ORAL_TABLET | ORAL | 2 refills | Status: DC
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
## 2016-12-23 ENCOUNTER — Telehealth (INDEPENDENT_AMBULATORY_CARE_PROVIDER_SITE_OTHER): Payer: Self-pay

## 2016-12-23 LAB — FECAL OCCULT BLOOD, IMMUNOCHEMICAL: Fecal Occult Bld: NEGATIVE

## 2016-12-23 NOTE — Telephone Encounter (Signed)
-----   Message from Loletta Specteroger David Gomez, PA-C sent at 12/23/2016  1:36 PM EDT ----- Negative fecal occult blood. Repeat next year.

## 2016-12-23 NOTE — Telephone Encounter (Signed)
Patient aware of negative fecal occult blood. Maryjean Mornempestt S Roberts, CMA

## 2017-01-27 ENCOUNTER — Other Ambulatory Visit (HOSPITAL_COMMUNITY): Payer: Self-pay

## 2017-01-27 DIAGNOSIS — F319 Bipolar disorder, unspecified: Secondary | ICD-10-CM

## 2017-01-27 MED ORDER — LAMOTRIGINE 25 MG PO TABS: ORAL_TABLET | ORAL | 0 refills | Status: DC

## 2017-01-31 ENCOUNTER — Other Ambulatory Visit (INDEPENDENT_AMBULATORY_CARE_PROVIDER_SITE_OTHER): Payer: Self-pay | Admitting: Physician Assistant

## 2017-01-31 MED ORDER — HYDROCHLOROTHIAZIDE 25 MG PO TABS: 25.0000 mg | ORAL_TABLET | Freq: Every day | ORAL | 3 refills | Status: DC

## 2017-01-31 MED ORDER — LOSARTAN POTASSIUM 100 MG PO TABS: 100.0000 mg | ORAL_TABLET | Freq: Every day | ORAL | 3 refills | Status: DC

## 2017-03-17 ENCOUNTER — Other Ambulatory Visit (HOSPITAL_COMMUNITY): Payer: Self-pay | Admitting: Psychiatry

## 2017-03-17 DIAGNOSIS — F319 Bipolar disorder, unspecified: Secondary | ICD-10-CM

## 2017-03-20 ENCOUNTER — Encounter (HOSPITAL_COMMUNITY): Payer: Self-pay | Admitting: Psychiatry

## 2017-03-20 ENCOUNTER — Ambulatory Visit (INDEPENDENT_AMBULATORY_CARE_PROVIDER_SITE_OTHER): Payer: Medicare Other | Admitting: Psychiatry

## 2017-03-20 DIAGNOSIS — F1099 Alcohol use, unspecified with unspecified alcohol-induced disorder: Secondary | ICD-10-CM | POA: Diagnosis not present

## 2017-03-20 DIAGNOSIS — F319 Bipolar disorder, unspecified: Secondary | ICD-10-CM

## 2017-03-20 DIAGNOSIS — Z62811 Personal history of psychological abuse in childhood: Secondary | ICD-10-CM | POA: Diagnosis not present

## 2017-03-20 MED ORDER — LAMOTRIGINE 25 MG PO TABS: ORAL_TABLET | ORAL | 0 refills | Status: DC
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
## 2017-04-09 ENCOUNTER — Emergency Department (HOSPITAL_COMMUNITY): Payer: Medicare Other

## 2017-04-09 ENCOUNTER — Ambulatory Visit (INDEPENDENT_AMBULATORY_CARE_PROVIDER_SITE_OTHER): Payer: Medicare Other | Admitting: Physician Assistant

## 2017-04-09 ENCOUNTER — Encounter (INDEPENDENT_AMBULATORY_CARE_PROVIDER_SITE_OTHER): Payer: Self-pay | Admitting: Physician Assistant

## 2017-04-09 ENCOUNTER — Encounter (HOSPITAL_COMMUNITY): Payer: Self-pay

## 2017-04-09 ENCOUNTER — Other Ambulatory Visit: Payer: Self-pay

## 2017-04-09 ENCOUNTER — Emergency Department (HOSPITAL_COMMUNITY)
Admission: EM | Admit: 2017-04-09 | Discharge: 2017-04-09 | Disposition: A | Discharge status: Home / Self Care | Payer: Medicare Other | Attending: Emergency Medicine | Admitting: Emergency Medicine

## 2017-04-09 VITALS — BP 124/76 | HR 73 | Temp 98.5°F | Resp 18 | Ht 63.0 in | Wt 206.0 lb

## 2017-04-09 DIAGNOSIS — J0100 Acute maxillary sinusitis, unspecified: Secondary | ICD-10-CM

## 2017-04-09 DIAGNOSIS — R0981 Nasal congestion: Secondary | ICD-10-CM | POA: Diagnosis not present

## 2017-04-09 DIAGNOSIS — J3489 Other specified disorders of nose and nasal sinuses: Secondary | ICD-10-CM | POA: Insufficient documentation

## 2017-04-09 DIAGNOSIS — H532 Diplopia: Secondary | ICD-10-CM | POA: Diagnosis not present

## 2017-04-09 DIAGNOSIS — I10 Essential (primary) hypertension: Secondary | ICD-10-CM | POA: Diagnosis not present

## 2017-04-09 DIAGNOSIS — Z880 Allergy status to penicillin: Secondary | ICD-10-CM | POA: Diagnosis not present

## 2017-04-09 DIAGNOSIS — Z79899 Other long term (current) drug therapy: Secondary | ICD-10-CM | POA: Diagnosis not present

## 2017-04-09 DIAGNOSIS — Z885 Allergy status to narcotic agent status: Secondary | ICD-10-CM | POA: Insufficient documentation

## 2017-04-09 DIAGNOSIS — H538 Other visual disturbances: Secondary | ICD-10-CM | POA: Diagnosis present

## 2017-04-09 DIAGNOSIS — J018 Other acute sinusitis: Secondary | ICD-10-CM

## 2017-04-09 MED ORDER — DOXYCYCLINE HYCLATE 100 MG PO CAPS: 100.0000 mg | ORAL_CAPSULE | Freq: Two times a day (BID) | ORAL | 0 refills | Status: DC

## 2017-04-09 NOTE — ED Provider Notes (Addendum)
[facility] EMERGENCY DEPARTMENT Provider Note   CSN: [id number] Arrival date & time: 04/09/17  1130     History   Chief Complaint Chief Complaint  Patient presents with  . Blurred Vision    HPI Cheryl Prince is a 62 y.o. female.  Patient c/o sinus congestion and sinus pain for the past few days, stating she wants treatment for sinus infection.  Saw pcp for same who wanted pt to get CT head as pt had also noted some transient blurry vision. Pt currently denies eye pain, double vision or change in vision. No change in speech. No numbness/weakness, or change in normal functional ability. No cough or sob. No fever.    The history is provided by the patient.    Past Medical History:  Diagnosis Date  . Bronchitis     Patient Active Problem List   Diagnosis Date Noted  . PTSD (post-traumatic stress disorder) 05/30/2016  . Alpha thalassemia trait 05/30/2016  . Hypertension 05/30/2016  . Rotator cuff tendonitis, left 05/30/2016  . Osteoarthritis of left knee 05/30/2016  . Carpal tunnel syndrome 05/30/2016  . Prediabetes 05/30/2016  . Onychomycosis 05/30/2016  . S/P tonsillectomy 05/30/2016  . Goiter, toxic diffuse 05/30/2016  . Chronic tonsillitis 05/30/2016    Past Surgical History:  Procedure Laterality Date  . ABDOMINAL HYSTERECTOMY    . BREAST SURGERY    . CHOLECYSTECTOMY      OB History    No data available       Home Medications    Prior to Admission medications   Medication Sig Start Date End Date Taking? Authorizing Provider  FLOVENT HFA 110 MCG/ACT inhaler INL 2 PUFFS PO BID 07/16/16   [provider]  gabapentin (NEURONTIN) 300 MG capsule Take 1 capsule (300 mg total) by mouth 3 (three) times daily. 05/30/16   Loletta Specter, PA-C  hydrochlorothiazide (HYDRODIURIL) 25 MG tablet Take 1 tablet (25 mg total) by mouth daily. Take on tablet in the morning. 01/31/17   Loletta Specter, PA-C  ibuprofen (ADVIL,MOTRIN) 800 MG  tablet Take 1 tablet (800 mg total) by mouth 3 (three) times daily. 10/19/16   Deatra Canter, FNP  lamoTRIgine (LAMICTAL) 25 MG tablet Take 2 tab daily 03/20/17   Arfeen, Phillips Grout, MD  losartan (COZAAR) 100 MG tablet Take 1 tablet (100 mg total) by mouth daily. 01/31/17   Loletta Specter, PA-C    Family History No family history on file.  Social History Social History   Tobacco Use  . Smoking status: Never Smoker  . Smokeless tobacco: Never Used  Substance Use Topics  . Alcohol use: Yes  . Drug use: No     Allergies   Morphine and related; Penicillins; Grapeseed extract [nutritional supplements]; and Sulfites   Review of Systems Review of Systems  Constitutional: Negative for fever.  HENT: Positive for congestion, rhinorrhea and sinus pain.   Eyes: Negative for pain and redness.  Respiratory: Negative for cough and shortness of breath.   Cardiovascular: Negative for chest pain.  Gastrointestinal: Negative for abdominal pain.  Genitourinary: Negative for flank pain.  Musculoskeletal: Negative for neck pain and neck stiffness.  Skin: Negative for rash.  Neurological: Negative for weakness and numbness.  Hematological: Does not bruise/bleed easily.  Psychiatric/Behavioral: Negative for confusion.     Physical Exam Updated Vital Signs BP (!) 143/105   Pulse 72   Temp 98.2 F (36.8 C)   Resp 18   Wt 93.4 kg (206 lb)  SpO2 100%   BMI 36.49 kg/m   Physical Exam  Constitutional: She is oriented to person, place, and time. She appears well-developed and well-nourished. No distress.  HENT:  Head: Atraumatic.  No sinus or temporal tenderness.   Eyes: Conjunctivae and EOM are normal. Pupils are equal, round, and reactive to light. No scleral icterus.  Neck: Neck supple. No tracheal deviation present.  No bruits.   Cardiovascular: Normal rate, regular rhythm, normal heart sounds and intact distal pulses.  Pulmonary/Chest: Effort normal and breath sounds normal. No  respiratory distress.  Abdominal: Normal appearance. She exhibits no distension. There is no tenderness.  Musculoskeletal: She exhibits no edema.  Neurological: She is alert and oriented to person, place, and time.  Speech clear/fluent. No facial droop.   Skin: Skin is warm and dry. No rash noted. She is not diaphoretic.  Psychiatric: She has a normal mood and affect.  Nursing note and vitals reviewed.    ED Treatments / Results  Labs (all labs ordered are listed, but only abnormal results are displayed) Labs Reviewed - No data to display  EKG  EKG Interpretation None       Radiology Ct Head Wo Contrast  Result Date: 04/09/2017 CLINICAL DATA:  Visual loss. EXAM: CT HEAD WITHOUT CONTRAST TECHNIQUE: Contiguous axial images were obtained from the base of the skull through the vertex without intravenous contrast. COMPARISON:  None. FINDINGS: Brain: No acute intracranial abnormality. Specifically, no hemorrhage, hydrocephalus, mass lesion, acute infarction, or significant intracranial injury. Vascular: No hyperdense vessel or unexpected calcification. Skull: No acute calvarial abnormality. Sinuses/Orbits: Visualized paranasal sinuses and mastoids clear. Orbital soft tissues unremarkable. Other: None IMPRESSION: No acute intracranial abnormality. Electronically Signed   By: Charlett NoseKevin  Dover M.D.   On: 04/09/2017 13:37    Procedures Procedures (including critical care time)  Medications Ordered in ED Medications - No data to display   Initial Impression / Assessment and Plan / ED Course  I have reviewed the triage vital signs and the nursing notes.  Pertinent labs & imaging results that were available during my care of the patient were reviewed by me and considered in my medical decision making (see chart for details).  Pts pcp had requested ct . Ct ordered.   Pt with sinus congestion/sinus pain, c/w sinusitis.   rx for home.  Reviewed nursing notes and prior charts for additional  history.   Reviewed CT - neg acute.   Patient appears stable for d/c.     Final Clinical Impressions(s) / ED Diagnoses   Final diagnoses:  None    ED Discharge Orders    None           Cathren LaineSteinl, [place] Wachter, MD 04/09/17 1347

## 2017-04-09 NOTE — Progress Notes (Signed)
Subjective:  Patient ID: Cheryl Prince, female    DOB: 01-26-1956  Age: 62 y.o. MRN: [id number]  CC: sinusitis  HPI  Cheryl Prince is a 62 y.o. female with a medical history of partial hysterectomy and cervicalgia presents with sinus pressure x 1 month. Says she was exposed to someone with sinusitis. Has taken OTC products with some relief and thought her illness had almost resolved. However, pt reports having expelled a large bloody mass through the nose and is especially concerned with the double vision she is experiencing. Says that she sees "two of you", making reference to me. Says she has had blurry vision with sinusitis before but not double vision. Feels pressure in the eye bilaterally and pressure L > R in the maxillary sinus. Does not endorse any other symptoms.       Outpatient Medications Prior to Visit  Medication Sig Dispense Refill  . FLOVENT HFA 110 MCG/ACT inhaler INL 2 PUFFS PO BID  1  . gabapentin (NEURONTIN) 300 MG capsule Take 1 capsule (300 mg total) by mouth 3 (three) times daily. 90 capsule 3  . hydrochlorothiazide (HYDRODIURIL) 25 MG tablet Take 1 tablet (25 mg total) by mouth daily. Take on tablet in the morning. 90 tablet 3  . ibuprofen (ADVIL,MOTRIN) 800 MG tablet Take 1 tablet (800 mg total) by mouth 3 (three) times daily. 21 tablet 0  . lamoTRIgine (LAMICTAL) 25 MG tablet Take 2 tab daily 180 tablet 0  . losartan (COZAAR) 100 MG tablet Take 1 tablet (100 mg total) by mouth daily. 90 tablet 3   No facility-administered medications prior to visit.      ROS Review of Systems  Constitutional: Negative for chills, fever and malaise/fatigue.  HENT: Positive for congestion.   Eyes: Positive for blurred vision, double vision and pain.  Respiratory: Negative for shortness of breath.   Cardiovascular: Negative for chest pain and palpitations.  Gastrointestinal: Negative for abdominal pain and nausea.  Genitourinary: Negative for dysuria and hematuria.   Musculoskeletal: Negative for joint pain and myalgias.  Skin: Negative for rash.  Neurological: Negative for tingling and headaches.  Psychiatric/Behavioral: Negative for depression. The patient is not nervous/anxious.     Objective:  BP 124/76 (BP Location: Left Arm, Patient Position: Sitting, Cuff Size: Large)   Pulse 73   Temp 98.5 F (36.9 C) (Oral)   Resp 18   Ht 5\' 3"  (1.6 m)   Wt 206 lb (93.4 kg)   SpO2 99%   BMI 36.49 kg/m   BP/Weight 04/09/2017 12/11/2016 10/19/2016  Systolic BP 124 115 119  Diastolic BP 76 80 68  Wt. (Lbs) 206 199 -  BMI 36.49 37.09 -  Some encounter information is confidential and restricted. Go to Review Flowsheets activity to see all data.      Physical Exam  Constitutional: She is oriented to person, place, and time.  Well developed, well nourished, NAD, polite  HENT:  Head: Normocephalic and atraumatic.  Maxillary sinus tenderness L > R. No frontal sinus tenderness  Eyes: Conjunctivae and EOM are normal. Pupils are equal, round, and reactive to light. Right eye exhibits no discharge. Left eye exhibits no discharge. No scleral icterus.  Papilledema of left eye noted  Neck: Normal range of motion. Neck supple. No thyromegaly present.  Cardiovascular: Normal rate, regular rhythm and normal heart sounds.  No murmur heard. Pulmonary/Chest: Effort normal and breath sounds normal. No respiratory distress. She has no wheezes.  Abdominal: Bowel sounds are normal.  Musculoskeletal: She  exhibits no edema.  Lymphadenopathy:    She has no cervical adenopathy.  Neurological: She is alert and oriented to person, place, and time. No cranial nerve deficit. Coordination normal.  Skin: Skin is warm and dry. No rash noted. No erythema. No pallor.  Psychiatric: She has a normal mood and affect. Her behavior is normal. Thought content normal.  Vitals reviewed.    Assessment & Plan:    1. Diplopia - Pt strongly advised to go to ED for emergent CT brain  evaluation  2. Chronic maxillary sinusitis - Will await ED evaluation before treatment.    Follow-up:   Loletta Specteroger David Gomez PA

## 2017-04-09 NOTE — ED Triage Notes (Signed)
Pt presents to the ed with complaints of feeling like she has a sinus infection for the past month with off and on blurry vision and pain in her left eye. Pt is alert and oriented. No focal neuro deficits. Dr. Denton Lanksteinl at bedside

## 2017-04-09 NOTE — Discharge Instructions (Signed)
It was our pleasure to provide your ER care today - we hope that you feel better.  Take antibiotic as prescribed.  Try claritin-d or zyrtec-d as need for sinus pain/congestion.   Follow up with primary care doctor in 1 week if symptoms fail to improve/resolve.  If recurrent eye symptoms, follow up closely with eye specialist- see referral.   Return to ER if worse, new symptoms, new or severe pain, change in vision, other concern.

## 2017-04-09 NOTE — Patient Instructions (Signed)
I am strongly recommending you go to the Emergency Department for evaluation of your double vision. Double vision may have a serious cause that can lead to blindness, disability, or death.   Diplopia Diplopia is the condition of having double vision or seeing two of a single object. There are many causes of diplopia. Some are not dangerous and can be easily corrected. Diplopia may also be a symptom of a serious medical problem. There are two types of diplopia.  Monocular diplopia. This is double vision that affects only one eye. Monocular diplopia is often caused by a clouding of the lens in your eye (cataract) or by disruptions in the way that your eye focuses light.  Binocular diplopia. This is double vision that affects both eyes. However, when you shut one eye, the double vision will go away. Binocular diplopia may be more serious. It can be caused by: ? Problems with the nerves or muscles that are responsible for eye movement. ? Neurologic diseases. ? Thyroid problems. ? Tumors. ? An infection near your eyes. ? A stroke.  You may need to see a health care provider who specializes in eye conditions (ophthalmologist) or a nerve specialist (neurologist) to find the cause. Follow these instructions at home:  Tell your health care provider about any changes in your vision.  Do not drive or operate heavy machinery if diplopia interferes with your vision.  Keep all follow-up visits as directed by your health care provider. This is important. Contact a health care provider if:  Your diplopia gets worse.  You develop any other symptoms along with your diplopia, such as: ? Weakness. ? Numbness. ? Headache. ? Eye pain. ? Clumsiness. ? Nausea. ? Drooping eyelids. ? Abnormal movement of one of your eyes. Get help right away if:  You have sudden vision loss.  You suddenly get a very bad headache.  You have sudden weakness or numbness.  You suddenly lose the ability to speak,  understand speech, or both. This information is not intended to replace advice given to you by your health care provider. Make sure you discuss any questions you have with your health care provider. Document Released: 12/14/2003 Document Revised: 07/20/2015 Document Reviewed: 01/05/2014 Elsevier Interactive Patient Education  [employer]2018 [employer]vier Inc[employer]

## 2017-05-12 ENCOUNTER — Telehealth (INDEPENDENT_AMBULATORY_CARE_PROVIDER_SITE_OTHER): Payer: Self-pay | Admitting: Physician Assistant

## 2017-05-12 NOTE — Telephone Encounter (Signed)
Patient called she switch pharmacies and she is confused with the medications that she is taking losartan and hydrochlorothiazide  She will be here tomorrow with  Sherian Reinmaure for her  appointment

## 2017-05-13 ENCOUNTER — Telehealth (INDEPENDENT_AMBULATORY_CARE_PROVIDER_SITE_OTHER): Payer: Self-pay | Admitting: Physician Assistant

## 2017-05-13 ENCOUNTER — Other Ambulatory Visit (INDEPENDENT_AMBULATORY_CARE_PROVIDER_SITE_OTHER): Payer: Self-pay | Admitting: Physician Assistant

## 2017-05-13 DIAGNOSIS — M542 Cervicalgia: Secondary | ICD-10-CM

## 2017-05-13 MED ORDER — GABAPENTIN 300 MG PO CAPS: 300.0000 mg | ORAL_CAPSULE | Freq: Three times a day (TID) | ORAL | 3 refills | Status: DC

## 2017-05-13 NOTE — Telephone Encounter (Signed)
Sent!

## 2017-05-13 NOTE — Telephone Encounter (Signed)
Patient wants her medication  Gabapentin 300 mg  To be refer [facility]   Thank You

## 2017-05-13 NOTE — Telephone Encounter (Signed)
FWD to PCP. Aveon Colquhoun S Joia Doyle, CMA  

## 2017-05-25 ENCOUNTER — Ambulatory Visit (HOSPITAL_COMMUNITY)
Admission: EM | Admit: 2017-05-25 | Discharge: 2017-05-25 | Disposition: A | Discharge status: Home / Self Care | Payer: Medicare Other | Attending: Urgent Care | Admitting: Urgent Care

## 2017-05-25 ENCOUNTER — Encounter (HOSPITAL_COMMUNITY): Payer: Self-pay | Admitting: Family Medicine

## 2017-05-25 DIAGNOSIS — T7840XA Allergy, unspecified, initial encounter: Secondary | ICD-10-CM

## 2017-05-25 DIAGNOSIS — R05 Cough: Secondary | ICD-10-CM | POA: Diagnosis not present

## 2017-05-25 DIAGNOSIS — J9801 Acute bronchospasm: Secondary | ICD-10-CM

## 2017-05-25 DIAGNOSIS — R059 Cough, unspecified: Secondary | ICD-10-CM

## 2017-05-25 DIAGNOSIS — J40 Bronchitis, not specified as acute or chronic: Secondary | ICD-10-CM | POA: Diagnosis not present

## 2017-05-25 DIAGNOSIS — Z9109 Other allergy status, other than to drugs and biological substances: Secondary | ICD-10-CM

## 2017-05-25 HISTORY — DX: Essential (primary) hypertension: I10

## 2017-05-25 MED ORDER — PREDNISONE 20 MG PO TABS: ORAL_TABLET | ORAL | 0 refills | Status: DC

## 2017-05-25 MED ORDER — MONTELUKAST SODIUM 10 MG PO TABS: 10.0000 mg | ORAL_TABLET | Freq: Every day | ORAL | 0 refills | Status: DC

## 2017-05-25 MED ORDER — CETIRIZINE HCL 10 MG PO TABS: 10.0000 mg | ORAL_TABLET | Freq: Every day | ORAL | 1 refills | Status: DC

## 2017-05-25 NOTE — Discharge Instructions (Signed)
Hydrate well with at least 2 liters (1 gallon) of water daily.  °

## 2017-05-25 NOTE — ED Provider Notes (Addendum)
  MRN: [id number] DOB: [birth year]  Subjective:   Cheryl Prince is a 62 y.o. female presenting for 1 month history of intermittent chest congestion, wheezing, productive cough, sore throat worse in the past 2 weeks. Cough elicits chest pain. Has had subjective fever. Has tried APAP, Coricidin. Denies sinus congestion, sinus pain, ear pain, n/v, abdominal pain. Denies smoking cigarettes. Has a history of bronchitis, was told that she may have asthma. Patient took doxycycline in 03/2017 for sinus infection. Uses albuterol twice daily.   No current facility-administered medications for this encounter.   Current Outpatient Medications:  .  doxycycline (VIBRAMYCIN) 100 MG capsule, Take 1 capsule (100 mg total) by mouth 2 (two) times daily., Disp: 14 capsule, Rfl: 0 .  FLOVENT HFA 110 MCG/ACT inhaler, INL 2 PUFFS PO BID, Disp: , Rfl: 1 .  gabapentin (NEURONTIN) 300 MG capsule, Take 1 capsule (300 mg total) by mouth 3 (three) times daily., Disp: 90 capsule, Rfl: 3 .  hydrochlorothiazide (HYDRODIURIL) 25 MG tablet, Take 1 tablet (25 mg total) by mouth daily. Take on tablet in the morning., Disp: 90 tablet, Rfl: 3 .  ibuprofen (ADVIL,MOTRIN) 800 MG tablet, Take 1 tablet (800 mg total) by mouth 3 (three) times daily., Disp: 21 tablet, Rfl: 0 .  lamoTRIgine (LAMICTAL) 25 MG tablet, Take 2 tab daily, Disp: 180 tablet, Rfl: 0 .  losartan (COZAAR) 100 MG tablet, Take 1 tablet (100 mg total) by mouth daily., Disp: 90 tablet, Rfl: 3   Allergies  Allergen Reactions  . Morphine And Related Swelling  . Penicillins Swelling  . Grapeseed Extract [Nutritional Supplements]   . Sulfites Rash    Past Medical History:  Diagnosis Date  . Bronchitis   . Hypertension      Past Surgical History:  Procedure Laterality Date  . ABDOMINAL HYSTERECTOMY    . BREAST SURGERY    . CHOLECYSTECTOMY      Objective:   Vitals: BP 118/62   Pulse 84   Temp 98.5 F (36.9 C)   Resp 18   SpO2 100%   Physical Exam   Constitutional: She is oriented to person, place, and time. She appears well-developed and well-nourished.  HENT:  Right Ear: Tympanic membrane normal.  Left Ear: Tympanic membrane normal.  Oropharynx with significant post-nasal drainage.  Eyes: Right eye exhibits no discharge. Left eye exhibits no discharge. No scleral icterus.  Cardiovascular: Normal rate, regular rhythm and intact distal pulses. Exam reveals no gallop and no friction rub.  No murmur heard. Pulmonary/Chest: No respiratory distress. She has no wheezes. She has no rales.  Neurological: She is alert and oriented to person, place, and time.  Skin: Skin is warm and dry.  Psychiatric: She has a normal mood and affect.    Assessment and Plan :   Cough  Bronchospasm  Bronchitis  Environmental allergies  Will start patient on Singulair, Zyrtec. Recommended patient maintain her Flovent and albuterol prn. Start short steroid course. Counseled patient on potential for adverse effects with medications prescribed today, patient verbalized understanding. Return-to-clinic precautions discussed, patient verbalized understanding.   Wallis BambergMani, Alphons Burgert, [place] JerseyPA-C 05/25/17 1757

## 2017-05-25 NOTE — ED Triage Notes (Addendum)
Pt here for congestion, wheezing, coughing and sore throat x 2 weeks. Taking tylenol and coricidin.

## 2017-06-18 ENCOUNTER — Other Ambulatory Visit: Payer: Self-pay

## 2017-06-18 ENCOUNTER — Ambulatory Visit (INDEPENDENT_AMBULATORY_CARE_PROVIDER_SITE_OTHER): Payer: Medicare Other | Admitting: Physician Assistant

## 2017-06-18 ENCOUNTER — Encounter (HOSPITAL_COMMUNITY): Payer: Self-pay | Admitting: Psychiatry

## 2017-06-18 ENCOUNTER — Encounter (INDEPENDENT_AMBULATORY_CARE_PROVIDER_SITE_OTHER): Payer: Self-pay | Admitting: Physician Assistant

## 2017-06-18 ENCOUNTER — Ambulatory Visit (INDEPENDENT_AMBULATORY_CARE_PROVIDER_SITE_OTHER): Payer: Medicare Other | Admitting: Psychiatry

## 2017-06-18 VITALS — BP 137/87 | HR 80 | Temp 98.3°F | Ht 63.0 in | Wt 211.0 lb

## 2017-06-18 DIAGNOSIS — I10 Essential (primary) hypertension: Secondary | ICD-10-CM

## 2017-06-18 DIAGNOSIS — F1010 Alcohol abuse, uncomplicated: Secondary | ICD-10-CM | POA: Diagnosis not present

## 2017-06-18 DIAGNOSIS — Z111 Encounter for screening for respiratory tuberculosis: Secondary | ICD-10-CM | POA: Diagnosis not present

## 2017-06-18 DIAGNOSIS — F319 Bipolar disorder, unspecified: Secondary | ICD-10-CM

## 2017-06-18 MED ORDER — LAMOTRIGINE 25 MG PO TABS: ORAL_TABLET | ORAL | 0 refills | Status: DC
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
## 2017-06-18 NOTE — Patient Instructions (Addendum)
Tuberculin Skin Test Why am I having this test? Tuberculosis (TB) is a bacterial infection caused by Mycobacterium tuberculosis. Most people who are exposed to these bacteria have a strong enough defense (immune) system to prevent the bacteria from causing TB and developing symptoms. Their bodies prevent the germs from being active and making them sick (latent TB infection). However, if you have TB germs in your body and your immune system is weak, you can develop a TB infection. This can cause symptoms such as:  Night sweats.  Fever.  Weakness.  Weight loss.  A latent TB infection can also become active later in life if your immune system becomes weakened or compromised. You may have this test if your health care provider suspects that you have TB. You may also have this test to screen for TB if you are at risk for getting the disease. Those at increased risk include:  People who inject illegal drugs or share needles.  People with HIV or other diseases that affect immunity.  Health care workers.  People who live in high-risk communities, such as homeless shelters, nursing homes, and correctional facilities.  People who have been in contact with someone with TB.  People from countries where TB is more common.  If you are in a high-risk group, your health care provider may wish to screen for TB more often. This can help prevent the spread of the disease. Sometimes TB screening is required when starting a new job, such as becoming a Public house manager or a Pharmacist, hospital. Colleges or universities may require it of new students. What is being tested? A tuberculin skin test is the main test used to check for exposure to the bacteria that can cause TB. The test checks for antibodies to the bacteria. Antibodies are proteins that your body produces to protect you from germs and other things that can make you sick. Your health care provider will inject a solution known as PPD (purified protein  derivative) under the first layer of skin on your arm. This causes a blister-like bubble to form at the site. Your health care provider will then examine the site after a number of hours have passed to see if a reaction has occurred. How do I prepare for this test? There is no preparation required for this test. What do the results mean? Your test results will be reported as either negative or positive. If the tuberculin skin test produces a negative result, it is likely that you do not have TB and have not been exposed to the TB bacteria. If you or your health care provider suspects exposure, however, you may want to repeat the test a few weeks later. A blood test may also be used to check for TB. This is because you will not react to the tuberculin skin test until several weeks after exposure to TB bacteria. If you test positive to the tuberculin skin test, it is likely that you have been exposed to TB bacteria. The test does not distinguish between an active and a latent TB infection. A false-positive result can occur. A false-positive result for TB bacteria is incorrect because it indicates a condition or finding is present when it is not. Talk to your health care provider to discuss your results, treatment options, and if necessary, the need for more tests. It is your responsibility to obtain your test results. Ask the lab or department performing the test when and how you will get your results. Talk with your health care  provider if you have any questions about your results. Talk with your health care provider to discuss your results, treatment options, and if necessary, the need for more tests. Talk with your health care provider if you have any questions about your results. This information is not intended to replace advice given to you by your health care provider. Make sure you discuss any questions you have with your health care provider. Document Released: 11/21/2004 Document Revised:  10/15/2015 Document Reviewed: 06/07/2013 Elsevier Interactive Patient Education  2018 [employer]     DASH Eating Plan DASH stands for "Dietary Approaches to Stop Hypertension." The DASH eating plan is a healthy eating plan that has been shown to reduce high blood pressure (hypertension). It may also reduce your risk for type 2 diabetes, heart disease, and stroke. The DASH eating plan may also help with weight loss. What are tips for following this plan? General guidelines  Avoid eating more than 2,300 mg (milligrams) of salt (sodium) a day. If you have hypertension, you may need to reduce your sodium intake to 1,500 mg a day.  Limit alcohol intake to no more than 1 drink a day for nonpregnant women and 2 drinks a day for men. One drink equals 12 oz of beer, 5 oz of wine, or 1 oz of hard liquor.  Work with your health care provider to maintain a healthy body weight or to lose weight. Ask what an ideal weight is for you.  Get at least 30 minutes of exercise that causes your heart to beat faster (aerobic exercise) most days of the week. Activities may include walking, swimming, or biking.  Work with your health care provider or diet and nutrition specialist (dietitian) to adjust your eating plan to your individual calorie needs. Reading food labels  Check food labels for the amount of sodium per serving. Choose foods with less than 5 percent of the Daily Value of sodium. Generally, foods with less than 300 mg of sodium per serving fit into this eating plan.  To find whole grains, look for the word "whole" as the first word in the ingredient list. Shopping  Buy products labeled as "low-sodium" or "no salt added."  Buy fresh foods. Avoid canned foods and premade or frozen meals. Cooking  Avoid adding salt when cooking. Use salt-free seasonings or herbs instead of table salt or sea salt. Check with your health care provider or pharmacist before using salt substitutes.  Do not fry  foods. Cook foods using healthy methods such as baking, boiling, grilling, and broiling instead.  Cook with heart-healthy oils, such as olive, canola, soybean, or sunflower oil. Meal planning   Eat a balanced diet that includes: ? 5 or more servings of fruits and vegetables each day. At each meal, try to fill half of your plate with fruits and vegetables. ? Up to 6-8 servings of whole grains each day. ? Less than 6 oz of lean meat, poultry, or fish each day. A 3-oz serving of meat is about the same size as a deck of cards. One egg equals 1 oz. ? 2 servings of low-fat dairy each day. ? A serving of nuts, seeds, or beans 5 times each week. ? Heart-healthy fats. Healthy fats called Omega-3 fatty acids are found in foods such as flaxseeds and coldwater fish, like sardines, salmon, and mackerel.  Limit how much you eat of the following: ? Canned or prepackaged foods. ? Food that is high in trans fat, such as fried foods. ? Food  that is high in saturated fat, such as fatty meat. ? Sweets, desserts, sugary drinks, and other foods with added sugar. ? Full-fat dairy products.  Do not salt foods before eating.  Try to eat at least 2 vegetarian meals each week.  Eat more home-cooked food and less restaurant, buffet, and fast food.  When eating at a restaurant, ask that your food be prepared with less salt or no salt, if possible. What foods are recommended? The items listed may not be a complete list. Talk with your dietitian about what dietary choices are best for you. Grains Whole-grain or whole-wheat bread. Whole-grain or whole-wheat pasta. Brown rice. Orpah Cobb. Bulgur. Whole-grain and low-sodium cereals. Pita bread. Low-fat, low-sodium crackers. Whole-wheat flour tortillas. Vegetables Fresh or frozen vegetables (raw, steamed, roasted, or grilled). Low-sodium or reduced-sodium tomato and vegetable juice. Low-sodium or reduced-sodium tomato sauce and tomato paste. Low-sodium or  reduced-sodium canned vegetables. Fruits All fresh, dried, or frozen fruit. Canned fruit in natural juice (without added sugar). Meat and other protein foods Skinless chicken or [place]. Ground chicken or [place]. Pork with fat trimmed off. Fish and seafood. Egg whites. Dried beans, peas, or lentils. Unsalted nuts, nut butters, and seeds. Unsalted canned beans. Lean cuts of beef with fat trimmed off. Low-sodium, lean deli meat. Dairy Low-fat (1%) or fat-free (skim) milk. Fat-free, low-fat, or reduced-fat cheeses. Nonfat, low-sodium ricotta or cottage cheese. Low-fat or nonfat yogurt. Low-fat, low-sodium cheese. Fats and oils Soft margarine without trans fats. Vegetable oil. Low-fat, reduced-fat, or light mayonnaise and salad dressings (reduced-sodium). Canola, safflower, olive, soybean, and sunflower oils. Avocado. Seasoning and other foods Herbs. Spices. Seasoning mixes without salt. Unsalted popcorn and pretzels. Fat-free sweets. What foods are not recommended? The items listed may not be a complete list. Talk with your dietitian about what dietary choices are best for you. Grains Baked goods made with fat, such as croissants, muffins, or some breads. Dry pasta or rice meal packs. Vegetables Creamed or fried vegetables. Vegetables in a cheese sauce. Regular canned vegetables (not low-sodium or reduced-sodium). Regular canned tomato sauce and paste (not low-sodium or reduced-sodium). Regular tomato and vegetable juice (not low-sodium or reduced-sodium). Rosita Fire. Olives. Fruits Canned fruit in a light or heavy syrup. Fried fruit. Fruit in cream or butter sauce. Meat and other protein foods Fatty cuts of meat. Ribs. Fried meat. Tomasa Blase. Sausage. Bologna and other processed lunch meats. Salami. Fatback. Hotdogs. Bratwurst. Salted nuts and seeds. Canned beans with added salt. Canned or smoked fish. Whole eggs or egg yolks. Chicken or [place] with skin. Dairy Whole or 2% milk, cream, and half-and-half.  Whole or full-fat cream cheese. Whole-fat or sweetened yogurt. Full-fat cheese. Nondairy creamers. Whipped toppings. Processed cheese and cheese spreads. Fats and oils Butter. Stick margarine. Lard. Shortening. Ghee. Bacon fat. Tropical oils, such as coconut, palm kernel, or palm oil. Seasoning and other foods Salted popcorn and pretzels. Onion salt, garlic salt, seasoned salt, table salt, and sea salt. Worcestershire sauce. Tartar sauce. Barbecue sauce. Teriyaki sauce. Soy sauce, including reduced-sodium. Steak sauce. Canned and packaged gravies. Fish sauce. Oyster sauce. Cocktail sauce. Horseradish that you find on the shelf. Ketchup. Mustard. Meat flavorings and tenderizers. Bouillon cubes. Hot sauce and Tabasco sauce. Premade or packaged marinades. Premade or packaged taco seasonings. Relishes. Regular salad dressings. Where to find more information:  National Heart, Lung, and Blood Institute: PopSteam.is  American Heart Association: www.heart.org Summary  The DASH eating plan is a healthy eating plan that has been shown to reduce high blood pressure (  hypertension). It may also reduce your risk for type 2 diabetes, heart disease, and stroke.  With the DASH eating plan, you should limit salt (sodium) intake to 2,300 mg a day. If you have hypertension, you may need to reduce your sodium intake to 1,500 mg a day.  When on the DASH eating plan, aim to eat more fresh fruits and vegetables, whole grains, lean proteins, low-fat dairy, and heart-healthy fats.  Work with your health care provider or diet and nutrition specialist (dietitian) to adjust your eating plan to your individual calorie needs. This information is not intended to replace advice given to you by your health care provider. Make sure you discuss any questions you have with your health care provider. Document Released: 01/31/2011 Document Revised: 02/05/2016 Document Reviewed: 02/05/2016 Elsevier Interactive Patient Education   [employer].

## 2017-06-18 NOTE — Progress Notes (Signed)
Subjective:  Patient ID: Cheryl Prince, female    DOB: 02/15/1956  Age: 62 y.o. 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 Has never had a positive TST.    BP is noted to be higher than previous visit. Patient takes medications as directed and thinks stress from her family over the past four days is contributing to increased blood pressure. Pt began to exercise at the [facility] yesterday. Does not endorse CP, palpitations, SOB, HA, abdominal pain, LE swelling, rash, or GI/GU sxs.    Outpatient Medications Prior to Visit  Medication Sig Dispense Refill  . cetirizine (ZYRTEC ALLERGY) 10 MG tablet Take 1 tablet (10 mg total) by mouth daily. 90 tablet 1  . FLOVENT HFA 110 MCG/ACT inhaler INL 2 PUFFS PO BID  1  . gabapentin (NEURONTIN) 300 MG capsule Take 1 capsule (300 mg total) by mouth 3 (three) times daily. 90 capsule 3  . hydrochlorothiazide (HYDRODIURIL) 25 MG tablet Take 1 tablet (25 mg total) by mouth daily. Take on tablet in the morning. 90 tablet 3  . lamoTRIgine (LAMICTAL) 25 MG tablet Take 2 tab daily 180 tablet 0  . losartan (COZAAR) 100 MG tablet Take 1 tablet (100 mg total) by mouth daily. 90 tablet 3  . montelukast (SINGULAIR) 10 MG tablet Take 1 tablet (10 mg total) by mouth at bedtime. 90 tablet 0  . ibuprofen (ADVIL,MOTRIN) 800 MG tablet Take 1 tablet (800 mg total) by mouth 3 (three) times daily. 21 tablet 0  . predniSONE (DELTASONE) 20 MG tablet Take 2 tablets daily with breakfast. 10 tablet 0   No facility-administered medications prior to visit.      ROS Review of Systems  Constitutional: Negative for chills, fever and malaise/fatigue.  Eyes: Negative for blurred vision.  Respiratory: Negative for shortness of breath.    Cardiovascular: Negative for chest pain and palpitations.  Gastrointestinal: Negative for abdominal pain and nausea.  Genitourinary: Negative for dysuria and hematuria.  Musculoskeletal: Negative for joint pain and myalgias.  Skin: Negative for rash.  Neurological: Negative for tingling and headaches.  Psychiatric/Behavioral: Negative for depression. The patient is not nervous/anxious.     Objective:  BP 137/87 (BP Location: Left Arm, Patient Position: Sitting, Cuff Size: Normal)   Pulse 80   Temp 98.3 F (36.8 C) (Oral)   Ht 5\' 3"  (1.6 m)   Wt 211 lb (95.7 kg)   SpO2 99%   BMI 37.38 kg/m   BP/Weight 06/18/2017 05/25/2017 04/09/2017  Systolic BP 137 118 143  Diastolic BP 87 62 105  Wt. (Lbs) 211 - 206  BMI 37.38 - 36.49  Some encounter information is confidential and restricted. Go to Review Flowsheets activity to see all data.      Physical Exam  Constitutional: She is oriented to person, place, and time.  Well developed, obese, NAD, polite  HENT:  Head: Normocephalic and atraumatic.  Cardiovascular: Normal rate, regular rhythm and normal heart sounds.  Pulmonary/Chest: Effort normal and breath sounds normal.  Musculoskeletal: She exhibits no edema.  Neurological: She is alert and oriented to person, place, and time.  Skin: Skin is warm and dry. No rash noted. No erythema. No pallor.  Psychiatric: She has a normal mood and affect. Her behavior is normal. Thought content normal.  Vitals reviewed.  Assessment & Plan:    1. Screening for tuberculosis - PPD  2. Hypertension, unspecified type - Worse than at previous visit (see vitals section) Will retake BP at her PPD reading in two days.    Follow-up: PRN  Loletta Specteroger David Antonin Meininger PA

## 2017-09-16 ENCOUNTER — Other Ambulatory Visit (HOSPITAL_COMMUNITY): Payer: Self-pay | Admitting: Psychiatry

## 2017-09-16 DIAGNOSIS — F319 Bipolar disorder, unspecified: Secondary | ICD-10-CM

## 2017-09-17 ENCOUNTER — Telehealth (INDEPENDENT_AMBULATORY_CARE_PROVIDER_SITE_OTHER): Payer: Self-pay | Admitting: Physician Assistant

## 2017-09-17 NOTE — Telephone Encounter (Signed)
Pt needs an evaluation before further refills.

## 2017-09-17 NOTE — Telephone Encounter (Signed)
Patient called requesting a medication for   prochlorperazine (COMPAZINE) 10 MG tablet   Patient states she still has stomach problems and was taking the medication 3 times daily and that it would help her. Patient also stated that she has 3 pills left.  Please advice   Thank You Cheryl Prince

## 2017-09-18 NOTE — Telephone Encounter (Signed)
Left message informing patient of need for evaluation before refill. Call office to schedule appointment. Maryjean Mornempestt S Nachman Sundt, CMA

## 2017-09-22 ENCOUNTER — Ambulatory Visit (HOSPITAL_COMMUNITY): Payer: Self-pay | Admitting: Psychiatry

## 2017-11-17 ENCOUNTER — Encounter (INDEPENDENT_AMBULATORY_CARE_PROVIDER_SITE_OTHER): Payer: Self-pay

## 2017-11-17 ENCOUNTER — Ambulatory Visit (INDEPENDENT_AMBULATORY_CARE_PROVIDER_SITE_OTHER): Payer: Medicare Other | Admitting: Psychiatry

## 2017-11-17 ENCOUNTER — Other Ambulatory Visit: Payer: Self-pay

## 2017-11-17 ENCOUNTER — Encounter (HOSPITAL_COMMUNITY): Payer: Self-pay | Admitting: Psychiatry

## 2017-11-17 DIAGNOSIS — F419 Anxiety disorder, unspecified: Secondary | ICD-10-CM | POA: Diagnosis not present

## 2017-11-17 DIAGNOSIS — F1099 Alcohol use, unspecified with unspecified alcohol-induced disorder: Secondary | ICD-10-CM

## 2017-11-17 DIAGNOSIS — F319 Bipolar disorder, unspecified: Secondary | ICD-10-CM

## 2017-11-17 MED ORDER — LAMOTRIGINE 25 MG PO TABS: ORAL_TABLET | ORAL | 0 refills | Status: DC
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
## 2017-12-04 ENCOUNTER — Other Ambulatory Visit (INDEPENDENT_AMBULATORY_CARE_PROVIDER_SITE_OTHER): Payer: Self-pay | Admitting: Physician Assistant

## 2017-12-04 NOTE — Telephone Encounter (Signed)
FWD to PCP. Tempestt S Roberts, CMA  

## 2017-12-30 ENCOUNTER — Ambulatory Visit (INDEPENDENT_AMBULATORY_CARE_PROVIDER_SITE_OTHER): Payer: Medicare Other | Admitting: Physician Assistant

## 2017-12-30 ENCOUNTER — Other Ambulatory Visit: Payer: Self-pay

## 2017-12-30 ENCOUNTER — Encounter (INDEPENDENT_AMBULATORY_CARE_PROVIDER_SITE_OTHER): Payer: Self-pay | Admitting: Physician Assistant

## 2017-12-30 VITALS — BP 145/87 | HR 92 | Temp 98.2°F | Ht 63.0 in | Wt 213.4 lb

## 2017-12-30 DIAGNOSIS — M545 Low back pain, unspecified: Secondary | ICD-10-CM

## 2017-12-30 DIAGNOSIS — R112 Nausea with vomiting, unspecified: Secondary | ICD-10-CM

## 2017-12-30 DIAGNOSIS — I10 Essential (primary) hypertension: Secondary | ICD-10-CM

## 2017-12-30 DIAGNOSIS — R300 Dysuria: Secondary | ICD-10-CM

## 2017-12-30 DIAGNOSIS — R1013 Epigastric pain: Secondary | ICD-10-CM

## 2017-12-30 DIAGNOSIS — H532 Diplopia: Secondary | ICD-10-CM

## 2017-12-30 DIAGNOSIS — Z760 Encounter for issue of repeat prescription: Secondary | ICD-10-CM

## 2017-12-30 DIAGNOSIS — G8929 Other chronic pain: Secondary | ICD-10-CM

## 2017-12-30 DIAGNOSIS — R8290 Unspecified abnormal findings in urine: Secondary | ICD-10-CM

## 2017-12-30 LAB — POCT URINALYSIS DIPSTICK
BILIRUBIN UA: NEGATIVE
GLUCOSE UA: NEGATIVE
Ketones, UA: NEGATIVE
Leukocytes, UA: NEGATIVE
Nitrite, UA: NEGATIVE
Protein, UA: POSITIVE — AB
RBC UA: NEGATIVE
SPEC GRAV UA: 1.02 (ref 1.010–1.025)
Urobilinogen, UA: 2 E.U./dL — AB
pH, UA: 6.5 (ref 5.0–8.0)

## 2017-12-30 MED ORDER — GABAPENTIN 300 MG PO CAPS
300.0000 mg | ORAL_CAPSULE | Freq: Three times a day (TID) | ORAL | 3 refills | Status: DC
Start: 2017-12-30 — End: 2018-07-06

## 2017-12-30 MED ORDER — LOSARTAN POTASSIUM 100 MG PO TABS: 100.0000 mg | ORAL_TABLET | Freq: Every day | ORAL | 1 refills | Status: DC

## 2017-12-30 MED ORDER — NAPROXEN 500 MG PO TABS: 500.0000 mg | ORAL_TABLET | Freq: Two times a day (BID) | ORAL | 0 refills | Status: DC

## 2017-12-30 MED ORDER — HYDROCHLOROTHIAZIDE 25 MG PO TABS: ORAL_TABLET | ORAL | 1 refills | Status: DC

## 2017-12-30 MED ORDER — PROCHLORPERAZINE MALEATE 5 MG PO TABS
5.0000 mg | ORAL_TABLET | Freq: Four times a day (QID) | ORAL | 0 refills | Status: DC | PRN
Start: 2017-12-30 — End: 2018-05-13

## 2017-12-30 MED ORDER — CYCLOBENZAPRINE HCL 10 MG PO TABS
10.0000 mg | ORAL_TABLET | Freq: Three times a day (TID) | ORAL | 0 refills | Status: DC | PRN
Start: 2017-12-30 — End: 2018-05-13

## 2017-12-30 NOTE — Patient Instructions (Signed)
Nausea, Adult Feeling sick to your stomach (nausea) means that your stomach is upset or you feel like you have to throw up (vomit). Feeling sick to your stomach is usually not serious, but it may be an early sign of a more serious medical problem. As you feel sicker to your stomach, it can lead to throwing up (vomiting). If you throw up, or if you are not able to drink enough fluids, there is a risk of dehydration. Dehydration can make you feel tired and thirsty, have a dry mouth, and pee (urinate) less often. Older adults and people who have other diseases or a weak defense (immune) system have a higher risk of dehydration. The main goal of treating this condition is to:  Limit how often you feel sick to your stomach.  Prevent throwing up and dehydration.  Follow these instructions at home: Follow instructions from your doctor about how to care for yourself at home. Eating and drinking Follow these recommendations as told by your doctor:  Take an oral rehydration solution (ORS). This is a drink that is sold at pharmacies and stores.  Drink clear fluids in small amounts as you are able, such as: ? Water. ? Ice chips. ? Fruit juice that has water added (diluted fruit juice). ? Low-calorie sports drinks.  Eat bland, easy to digest foods in small amounts as you are able, such as: ? Bananas. ? Applesauce. ? Rice. ? Lean meats. ? Toast. ? Crackers.  Avoid drinking fluids that contain a lot of sugar or caffeine.  Avoid alcohol.  Avoid spicy or fatty foods.  General instructions  Drink enough fluid to keep your pee (urine) clear or pale yellow.  Wash your hands often. If you cannot use soap and water, use hand sanitizer.  Make sure that all people in your household wash their hands well and often.  Rest at home while you get better.  Take over-the-counter and prescription medicines only as told by your doctor.  Breathe slowly and deeply when you feel sick to your  stomach.  Watch your condition for any changes.  Keep all follow-up visits as told by your doctor. This is important. Contact a doctor if:  You have a headache.  You have new symptoms.  You feel sicker to your stomach.  You have a fever.  You feel light-headed or dizzy.  You throw up.  You are not able to keep fluids down. Get help right away if:  You have pain in your chest, neck, arm, or jaw.  You feel very weak or you pass out (faint).  You have throw up that is bright red or looks like coffee grounds.  You have bloody or black poop (stools), or poop that looks like tar.  You have a very bad headache, a stiff neck, or both.  You have very bad pain, cramping, or bloating in your belly.  You have a rash.  You have trouble breathing or you are breathing very quickly.  Your heart is beating very quickly.  Your skin feels cold and clammy.  You feel confused.  You have pain while peeing.  You have signs of dehydration, such as: ? Dark pee, or very little or no pee. ? Cracked lips. ? Dry mouth. ? Sunken eyes. ? Sleepiness. ? Weakness. These symptoms may be an emergency. Do not wait to see if the symptoms will go away. Get medical help right away. Call your local emergency services (911 in the [place] Do not drive yourself to   the hospital. This information is not intended to replace advice given to you by your health care provider. Make sure you discuss any questions you have with your health care provider. Document Released: 01/31/2011 Document Revised: 07/20/2015 Document Reviewed: 10/18/2014 Elsevier Interactive Patient Education  2018 Elsevier Inc.  

## 2017-12-30 NOTE — Progress Notes (Signed)
Subjective:  Patient ID: Cheryl Prince, female    DOB: Sep 24, 1955  Age: 62 y.o. MRN: [id number]  CC: HTN  HPI    Cheryl Prince a 62 y.o.femalewith a medical history of partial hysterectomy, paresthesia, cervicalgia, alcohol abuse, and bipolar 1 disorder presents to f/u on HTN. Last BP 137/87 mmHg five months ago. Prescribed HCTZ 25 mg and Losartan 100 mg which she is reportedly taking as directed. BP 145/87 today.     Patient complains of malaise and diplopia since this morning. Had a similar episode two years ago that self resolved. Does not want to go to ED. Says she is seeing better already and will likely resolve by tomorrow morning.     Right lower back pain since two weeks ago. Attributed to prolonged standing at work. Would like a note written stating she is allowed to sit at work.      Request refill for compazine for stomach upset. Has been having occasional nausea since her reported Roux-en-Y procedure done some time ago.      Review of notes shows Dr. Lolly Prince has diagnosed Bipolar 1 disorder. Pt states she does not have Bipolar 1 disorder and Dr. Lolly Prince is stupid for having diagnosed her with Bipolar 1 disorder. Taking Lamotrigine 25 mg, two tabs BID.      Pt states she is drinking approximately a pint of liquor four times per week. Says she has begun to party again and is sexually active again.       Outpatient Medications Prior to Visit  Medication Sig Dispense Refill  . FLOVENT HFA 110 MCG/ACT inhaler INL 2 PUFFS PO BID  1  . gabapentin (NEURONTIN) 300 MG capsule Take 1 capsule (300 mg total) by mouth 3 (three) times daily. 90 capsule 3  . hydrochlorothiazide (HYDRODIURIL) 25 MG tablet TAKE 1 TABLET BY MOUTH  DAILY IN THE MORNING. 30 tablet 0  . lamoTRIgine (LAMICTAL) 25 MG tablet TAKE 2 TABLETS BY MOUTH  DAILY 180 tablet 0  . losartan (COZAAR) 100 MG tablet Take 1 tablet (100 mg total) by mouth daily. 90 tablet 3  . cetirizine (ZYRTEC ALLERGY) 10 MG tablet Take 1  tablet (10 mg total) by mouth daily. 90 tablet 1  . montelukast (SINGULAIR) 10 MG tablet Take 1 tablet (10 mg total) by mouth at bedtime. 90 tablet 0   No facility-administered medications prior to visit.      ROS Review of Systems  Constitutional: Positive for malaise/fatigue. Negative for chills and fever.  Eyes: Positive for double vision. Negative for blurred vision.  Respiratory: Negative for shortness of breath.   Cardiovascular: Negative for chest pain and palpitations.  Gastrointestinal: Positive for nausea (occasionally). Negative for abdominal pain.  Genitourinary: Negative for dysuria and hematuria.  Musculoskeletal: Positive for back pain. Negative for joint pain and myalgias.  Skin: Negative for rash.  Neurological: Negative for tingling and headaches.  Psychiatric/Behavioral: Negative for depression. The patient is not nervous/anxious.     Objective:  BP (!) 145/87 (BP Location: Right Arm, Patient Position: Sitting, Cuff Size: Normal)   Pulse 92   Temp 98.2 F (36.8 C) (Oral)   Ht 5\' 3"  (1.6 m)   Wt 213 lb 6.4 oz (96.8 kg)   SpO2 98%   BMI 37.80 kg/m   BP/Weight 12/30/2017 06/18/2017 05/25/2017  Systolic BP 145 137 118  Diastolic BP 87 87 62  Wt. (Lbs) 213.4 211 -  BMI 37.8 37.38 -  Some encounter information is confidential and restricted. Go to  Review Flowsheets activity to see all data.      Physical Exam  Constitutional: She is oriented to person, place, and time.  Well developed, obese, NAD, polite  HENT:  Head: Normocephalic and atraumatic.  Eyes: Pupils are equal, round, and reactive to light. Conjunctivae and EOM are normal. No scleral icterus.  Neck: Normal range of motion. Neck supple. No thyromegaly present.  Cardiovascular: Normal rate, regular rhythm and normal heart sounds.  Pulmonary/Chest: Effort normal and breath sounds normal.  Abdominal: Soft. Bowel sounds are normal. There is no tenderness.  Musculoskeletal: She exhibits no edema.   Neurological: She is alert and oriented to person, place, and time.  Skin: Skin is warm and dry. No rash noted. No erythema. No pallor.  Psychiatric: She has a normal mood and affect. Her behavior is normal. Thought content normal.  Vitals reviewed.    Assessment & Plan:   1. Hypertension, unspecified type - Refill losartan (COZAAR) 100 MG tablet; Take 1 tablet (100 mg total) by mouth daily.  Dispense: 90 tablet; Refill: 1 - Refill hydrochlorothiazide (HYDRODIURIL) 25 MG tablet; TAKE 1 TABLET BY MOUTH  DAILY IN THE MORNING.  Dispense: 90 tablet; Refill: 1 - Comprehensive metabolic panel; Future - Lipid panel; Future  2. Acute right-sided low back pain without sciatica - DG Lumbar Spine Complete; Future - Refill cyclobenzaprine (FLEXERIL) 10 MG tablet; Take 1 tablet (10 mg total) by mouth 3 (three) times daily as needed for muscle spasms.  Dispense: 30 tablet; Refill: 0 - Refill naproxen (NAPROSYN) 500 MG tablet; Take 1 tablet (500 mg total) by mouth 2 (two) times daily with a meal.  Dispense: 30 tablet; Refill: 0  3. Abdominal pain, chronic, epigastric - Lipase; Future  4. Dysuria - Urinalysis Dipstick with urobilinogen of 2.0. No sign of blood or infection.  5. Abnormal urinalysis - CBC with Differential; Future - Hepatitis panel, acute; Future - Lactate Dehydrogenase; Future  6. Nausea and vomiting, intractability of vomiting not specified, unspecified vomiting type - prochlorperazine (COMPAZINE) 5 MG tablet; Take 1 tablet (5 mg total) by mouth every 6 (six) hours as needed for nausea or vomiting.  Dispense: 30 tablet; Refill: 0  7. Diplopia - Pt refuses to go to ED for evaluation. - Taking Lamotrigine 25 mg, two tabs BID which can cause visual disturbance in up to 5% of patients.   8. Medication refill - gabapentin (NEURONTIN) 300 MG capsule; Take 1 capsule (300 mg total) by mouth 3 (three) times daily.  Dispense: 90 capsule; Refill: 3  * Cheryl Prince usually presents  with a multitude of vague symptoms which are difficult to manage in the timeframe given for an encounter. Difficult to assess what she truly feels or would like evaluated. She seems to be an uncontrolled Bipolar pt and I have asked for her to follow up with her psychiatrist. Unfortunately, she thinks her psychiatrist is "stupid" which leads me to believe patient has poor insight. Also seems to have some intellectual challenges, e.g. "What is nausea".  Pt asked for a note to be written to allow her to sit at work. I wrote temporary restrictions recommending for her to be allowed to sit. I gave the note to pt and she said she sits anyways at work. Total time for this encounter is greater than one hour.      Meds ordered this encounter  Medications  . losartan (COZAAR) 100 MG tablet    Sig: Take 1 tablet (100 mg total) by mouth daily.  Dispense:  90 tablet    Refill:  1    Order Specific Question:   Supervising Provider    Answer:   Hoy Register [[id number]]  . hydrochlorothiazide (HYDRODIURIL) 25 MG tablet    Sig: TAKE 1 TABLET BY MOUTH  DAILY IN THE MORNING.    Dispense:  90 tablet    Refill:  1    Order Specific Question:   Supervising Provider    Answer:   Hoy Register [[id number]]  . gabapentin (NEURONTIN) 300 MG capsule    Sig: Take 1 capsule (300 mg total) by mouth 3 (three) times daily.    Dispense:  90 capsule    Refill:  3    Order Specific Question:   Supervising Provider    Answer:   Hoy Register [[id number]]  . prochlorperazine (COMPAZINE) 5 MG tablet    Sig: Take 1 tablet (5 mg total) by mouth every 6 (six) hours as needed for nausea or vomiting.    Dispense:  30 tablet    Refill:  0    Order Specific Question:   Supervising Provider    Answer:   Hoy Register [[id number]]  . cyclobenzaprine (FLEXERIL) 10 MG tablet    Sig: Take 1 tablet (10 mg total) by mouth 3 (three) times daily as needed for muscle spasms.    Dispense:  30 tablet    Refill:  0    Order Specific Question:    Supervising Provider    Answer:   Hoy Register [[id number]]  . naproxen (NAPROSYN) 500 MG tablet    Sig: Take 1 tablet (500 mg total) by mouth 2 (two) times daily with a meal.    Dispense:  30 tablet    Refill:  0    Order Specific Question:   Supervising Provider    Answer:   Hoy Register [[id number]]    Follow-up: Return in about 8 weeks (around 02/24/2018).   Loletta Specter PA

## 2017-12-31 ENCOUNTER — Ambulatory Visit (HOSPITAL_COMMUNITY)
Admission: RE | Admit: 2017-12-31 | Discharge: 2017-12-31 | Disposition: A | Discharge status: Home / Self Care | Payer: Medicare Other | Source: Ambulatory Visit | Attending: Physician Assistant | Admitting: Physician Assistant

## 2017-12-31 ENCOUNTER — Telehealth (INDEPENDENT_AMBULATORY_CARE_PROVIDER_SITE_OTHER): Payer: Self-pay

## 2017-12-31 ENCOUNTER — Other Ambulatory Visit (INDEPENDENT_AMBULATORY_CARE_PROVIDER_SITE_OTHER): Payer: Medicare Other

## 2017-12-31 ENCOUNTER — Other Ambulatory Visit (INDEPENDENT_AMBULATORY_CARE_PROVIDER_SITE_OTHER): Payer: Self-pay | Admitting: Physician Assistant

## 2017-12-31 DIAGNOSIS — R1013 Epigastric pain: Secondary | ICD-10-CM

## 2017-12-31 DIAGNOSIS — M5137 Other intervertebral disc degeneration, lumbosacral region: Secondary | ICD-10-CM | POA: Insufficient documentation

## 2017-12-31 DIAGNOSIS — I10 Essential (primary) hypertension: Secondary | ICD-10-CM

## 2017-12-31 DIAGNOSIS — R8290 Unspecified abnormal findings in urine: Secondary | ICD-10-CM

## 2017-12-31 DIAGNOSIS — M545 Low back pain, unspecified: Secondary | ICD-10-CM

## 2017-12-31 DIAGNOSIS — G8929 Other chronic pain: Secondary | ICD-10-CM

## 2017-12-31 DIAGNOSIS — M5441 Lumbago with sciatica, right side: Secondary | ICD-10-CM | POA: Insufficient documentation

## 2017-12-31 DIAGNOSIS — M4310 Spondylolisthesis, site unspecified: Secondary | ICD-10-CM

## 2017-12-31 NOTE — Telephone Encounter (Signed)
Patient is aware that XR revealed advanced appearing degenerative disease from L5-S1. Referral placed to ortho for treatment. Maryjean Morn, CMA

## 2017-12-31 NOTE — Telephone Encounter (Signed)
-----   Message from Loletta Specter, PA-C sent at 12/31/2017  2:54 PM EST ----- Advanced appearing degenerative disease L5-S1. I have referred her to orthopedics for treatment.

## 2017-12-31 NOTE — Progress Notes (Signed)
Labs collected by onsite [facility] phlebotomist. Devonte Migues S Jaycee Mckellips, CMA  

## 2018-01-01 ENCOUNTER — Telehealth (INDEPENDENT_AMBULATORY_CARE_PROVIDER_SITE_OTHER): Payer: Self-pay

## 2018-01-01 LAB — COMPREHENSIVE METABOLIC PANEL
ALBUMIN: 3.9 g/dL (ref 3.6–4.8)
ALK PHOS: 110 IU/L (ref 39–117)
ALT: 15 IU/L (ref 0–32)
AST: 23 IU/L (ref 0–40)
Albumin/Globulin Ratio: 1.2 (ref 1.2–2.2)
BILIRUBIN TOTAL: 0.6 mg/dL (ref 0.0–1.2)
BUN / CREAT RATIO: 24 (ref 12–28)
BUN: 17 mg/dL (ref 8–27)
CHLORIDE: 100 mmol/L (ref 96–106)
CO2: 25 mmol/L (ref 20–29)
CREATININE: 0.72 mg/dL (ref 0.57–1.00)
Calcium: 8.9 mg/dL (ref 8.7–10.3)
GFR calc Af Amer: 102 mL/min/{1.73_m2} (ref >59)
GFR calc non Af Amer: 89 mL/min/{1.73_m2} (ref >59)
GLUCOSE: 96 mg/dL (ref 65–99)
Globulin, Total: 3.3 g/dL (ref 1.5–4.5)
Potassium: 3.4 mmol/L — ABNORMAL LOW (ref 3.5–5.2)
Sodium: 138 mmol/L (ref 134–144)
Total Protein: 7.2 g/dL (ref 6.0–8.5)

## 2018-01-01 LAB — LIPID PANEL
Chol/HDL Ratio: 2.2 ratio (ref 0.0–4.4)
Cholesterol, Total: 158 mg/dL (ref 100–199)
HDL: 71 mg/dL (ref >39)
LDL Calculated: 66 mg/dL (ref 0–99)
TRIGLYCERIDES: 103 mg/dL (ref 0–149)
VLDL Cholesterol Cal: 21 mg/dL (ref 5–40)

## 2018-01-01 LAB — CBC WITH DIFFERENTIAL/PLATELET
BASOS ABS: 0 10*3/uL (ref 0.0–0.2)
Basos: 1 %
EOS (ABSOLUTE): 0 10*3/uL (ref 0.0–0.4)
EOS: 1 %
HEMATOCRIT: 34.2 % (ref 34.0–46.6)
HEMOGLOBIN: 10.6 g/dL — AB (ref 11.1–15.9)
IMMATURE GRANS (ABS): 0 10*3/uL (ref 0.0–0.1)
IMMATURE GRANULOCYTES: 0 %
LYMPHS ABS: 1.4 10*3/uL (ref 0.7–3.1)
LYMPHS: 38 %
MCH: 24.1 pg — ABNORMAL LOW (ref 26.6–33.0)
MCHC: 31 g/dL — AB (ref 31.5–35.7)
MCV: 78 fL — ABNORMAL LOW (ref 79–97)
Monocytes Absolute: 0.3 10*3/uL (ref 0.1–0.9)
Monocytes: 8 %
NEUTROS PCT: 52 %
Neutrophils Absolute: 1.9 10*3/uL (ref 1.4–7.0)
Platelets: 254 10*3/uL (ref 150–450)
RBC: 4.4 x10E6/uL (ref 3.77–5.28)
RDW: 15.6 % — ABNORMAL HIGH (ref 12.3–15.4)
WBC: 3.6 10*3/uL (ref 3.4–10.8)

## 2018-01-01 LAB — HEPATITIS PANEL, ACUTE
HEP A IGM: NEGATIVE
HEP B S AG: NEGATIVE
Hep B C IgM: NEGATIVE

## 2018-01-01 LAB — LACTATE DEHYDROGENASE: LDH: 170 IU/L (ref 119–226)

## 2018-01-01 LAB — LIPASE: Lipase: 23 U/L (ref 14–72)

## 2018-01-01 NOTE — Telephone Encounter (Signed)
-----   Message from Loletta Specter, PA-C sent at 01/01/2018  1:02 PM EST ----- Good news labs do not support hemolysis or liver disease. Keep BP controlled and we will monitor over time.

## 2018-01-01 NOTE — Telephone Encounter (Signed)
Patient is aware that labs do not support hemolysis or liver disease. Keep blood pressure controlled and we will monitor overtime. Maryjean Morn, CMA

## 2018-01-14 ENCOUNTER — Ambulatory Visit (INDEPENDENT_AMBULATORY_CARE_PROVIDER_SITE_OTHER): Payer: Medicare Other | Admitting: Orthopedic Surgery

## 2018-01-28 ENCOUNTER — Encounter (INDEPENDENT_AMBULATORY_CARE_PROVIDER_SITE_OTHER): Payer: Self-pay | Admitting: Orthopedic Surgery

## 2018-01-28 ENCOUNTER — Ambulatory Visit (INDEPENDENT_AMBULATORY_CARE_PROVIDER_SITE_OTHER): Payer: Medicare Other | Admitting: Orthopedic Surgery

## 2018-01-28 DIAGNOSIS — M545 Low back pain, unspecified: Secondary | ICD-10-CM

## 2018-01-28 NOTE — Progress Notes (Signed)
Office Visit Note   Patient: Cheryl Prince           Date of Birth: 02-14-1956           MRN: [id number] Visit Date: 01/28/2018 Requested by: Loletta Specter, PA-C [address] [place], [place] [address] PCP: Denny Levy  Subjective: Chief Complaint  Patient presents with  . Lower Back - Pain    HPI: [name] is a patient with low back pain.  She had radiographs done 12/31/2017 which are reviewed.  They show significant L5-S1 degenerative disc disease and grade 1 spondylolisthesis.  She has had pain for years.  Lifting some boxes recently made it worse.  Gabapentin helps.  Pain level is 6-7 out of 10.  Denies any leg symptoms but it is worse on the left side compared to the right.  Denies any groin symptoms.  She works part-time as a Engineer, maintenance.  She has a known history of left knee arthritis which is also better at night.              ROS: All systems reviewed are negative as they relate to the chief complaint within the history of present illness.  Patient denies  fevers or chills.   Assessment & Plan: Visit Diagnoses:  1. Low back pain, unspecified back pain laterality, unspecified chronicity, unspecified whether sciatica present     Plan: Impression is left knee pain with known history of arthritis.  She also has low back pain of long duration with fairly significant radiographic findings.  I will start her in physical therapy and also get open MRI of lumbar spine as a preamble to potentially doing epidural steroid injections.  Follow-up with me as needed.  I like to see her back after that MRI scan.  Follow-Up Instructions: Return for after MRI.   Orders:  Orders Placed This Encounter  Procedures  . MR Lumbar Spine w/o contrast   No orders of the defined types were placed in this encounter.     Procedures: No procedures performed   Clinical Data: No additional findings.  Objective: Vital Signs: There were no vitals taken for this  visit.  Physical Exam:   Constitutional: Patient appears well-developed HEENT:  Head: Normocephalic Eyes:EOM are normal Neck: Normal range of motion Cardiovascular: Normal rate Pulmonary/chest: Effort normal Neurologic: Patient is alert Skin: Skin is warm Psychiatric: Patient has normal mood and affect    Ortho Exam: Ortho exam demonstrates good ankle dorsiflexion plantarflexion strength with a slight flexion contracture in the left knee.  Pedal pulses palpable.  Collateral crucial ligaments are stable.  No groin pain with internal X rotation of the leg.  No other masses lymphadenopathy or skin changes noted in the leg region.  Reflexes symmetric 1+ out of 4 bilateral patella and Achilles.  No other nerve root tension signs on the left and right-hand side.  Some pain with forward and lateral bending is noted.  Specialty Comments:  No specialty comments available.  Imaging: No results found.   PMFS History: Patient Active Problem List   Diagnosis Date Noted  . PTSD (post-traumatic stress disorder) 05/30/2016  . Alpha thalassemia trait 05/30/2016  . Hypertension 05/30/2016  . Rotator cuff tendonitis, left 05/30/2016  . Osteoarthritis of left knee 05/30/2016  . Carpal tunnel syndrome 05/30/2016  . Prediabetes 05/30/2016  . Onychomycosis 05/30/2016  . S/P tonsillectomy 05/30/2016  . Goiter, toxic diffuse 05/30/2016  . Chronic tonsillitis 05/30/2016   Past Medical History:  Diagnosis Date  .  Bronchitis   . Hypertension     History reviewed. No pertinent family history.  Past Surgical History:  Procedure Laterality Date  . ABDOMINAL HYSTERECTOMY    . BREAST SURGERY    . CHOLECYSTECTOMY     Social History   Occupational History  . Not on file  Tobacco Use  . Smoking status: Never Smoker  . Smokeless tobacco: Never Used  Substance and Sexual Activity  . Alcohol use: Yes  . Drug use: No  . Sexual activity: Not on file

## 2018-02-17 ENCOUNTER — Other Ambulatory Visit (INDEPENDENT_AMBULATORY_CARE_PROVIDER_SITE_OTHER): Payer: Self-pay | Admitting: Physician Assistant

## 2018-02-17 DIAGNOSIS — I10 Essential (primary) hypertension: Secondary | ICD-10-CM

## 2018-02-19 NOTE — Telephone Encounter (Signed)
FWD to PCP. Tempestt S Roberts, CMA  

## 2018-03-09 ENCOUNTER — Ambulatory Visit (INDEPENDENT_AMBULATORY_CARE_PROVIDER_SITE_OTHER): Payer: Medicare Other | Admitting: Psychiatry

## 2018-03-09 ENCOUNTER — Telehealth (INDEPENDENT_AMBULATORY_CARE_PROVIDER_SITE_OTHER): Payer: Self-pay

## 2018-03-09 ENCOUNTER — Encounter (HOSPITAL_COMMUNITY): Payer: Self-pay | Admitting: Psychiatry

## 2018-03-09 VITALS — BP 130/76 | HR 70 | Ht 64.0 in | Wt 213.0 lb

## 2018-03-09 DIAGNOSIS — F1010 Alcohol abuse, uncomplicated: Secondary | ICD-10-CM | POA: Diagnosis not present

## 2018-03-09 DIAGNOSIS — M545 Low back pain, unspecified: Secondary | ICD-10-CM

## 2018-03-09 DIAGNOSIS — F4310 Post-traumatic stress disorder, unspecified: Secondary | ICD-10-CM | POA: Diagnosis not present

## 2018-03-09 DIAGNOSIS — F319 Bipolar disorder, unspecified: Secondary | ICD-10-CM

## 2018-03-09 MED ORDER — LAMOTRIGINE 25 MG PO TABS: ORAL_TABLET | ORAL | 0 refills | Status: DC
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
## 2018-03-09 NOTE — Telephone Encounter (Signed)
Patient PT orders?   I did not see orders in chart

## 2018-03-09 NOTE — Addendum Note (Signed)
Addended by: Cherre HugerMAY, Henli Hey E on: 03/09/2018 05:24 PM   Modules accepted: Orders

## 2018-03-09 NOTE — Telephone Encounter (Signed)
Faxed

## 2018-03-09 NOTE — Telephone Encounter (Signed)
Okay for physical therapy for back for hamstring stretching core strengthening 1-2 times a week for 6 weeks thanks

## 2018-03-09 NOTE — Telephone Encounter (Signed)
Regan with [facility] called stating that patient has lost her Rx for PT.  Would like a PT Rx faxed to [phone].  Please advise.  Thank you.

## 2018-03-10 ENCOUNTER — Telehealth (INDEPENDENT_AMBULATORY_CARE_PROVIDER_SITE_OTHER): Payer: Self-pay | Admitting: Orthopedic Surgery

## 2018-03-10 NOTE — Telephone Encounter (Signed)
Patient call needing to know the phone number and place she is having her (PT)  The number to contact patient is [phone]

## 2018-03-10 NOTE — Telephone Encounter (Signed)
Can you please call her and tell her to call the [facility] on [address]  [phone].  They will be able to see the order in the system, and they can schedule her.  Thanks!

## 2018-03-11 ENCOUNTER — Ambulatory Visit: Payer: Medicare Other | Admitting: Physical Therapy

## 2018-03-14 ENCOUNTER — Ambulatory Visit
Admission: RE | Admit: 2018-03-14 | Discharge: 2018-03-14 | Disposition: A | Discharge status: Home / Self Care | Payer: Medicare Other | Source: Ambulatory Visit | Attending: Orthopedic Surgery | Admitting: Orthopedic Surgery

## 2018-03-14 DIAGNOSIS — M545 Low back pain, unspecified: Secondary | ICD-10-CM

## 2018-03-15 ENCOUNTER — Encounter (HOSPITAL_COMMUNITY): Payer: Self-pay | Admitting: Emergency Medicine

## 2018-03-15 ENCOUNTER — Other Ambulatory Visit: Payer: Self-pay

## 2018-03-15 ENCOUNTER — Ambulatory Visit (HOSPITAL_COMMUNITY)
Admission: EM | Admit: 2018-03-15 | Discharge: 2018-03-15 | Disposition: A | Discharge status: Home / Self Care | Payer: Medicare Other | Attending: Internal Medicine | Admitting: Internal Medicine

## 2018-03-15 DIAGNOSIS — J0100 Acute maxillary sinusitis, unspecified: Secondary | ICD-10-CM | POA: Diagnosis not present

## 2018-03-15 MED ORDER — DOXYCYCLINE HYCLATE 100 MG PO CAPS: 100.0000 mg | ORAL_CAPSULE | Freq: Two times a day (BID) | ORAL | 0 refills | Status: DC

## 2018-03-15 NOTE — Discharge Instructions (Signed)
Rest and push fluids Use OTC flonase and/or zyrtec as needed for symptomatic relief Doxycycline prescribed.  Take as directed and to completion Continue with OTC ibuprofen/tylenol as needed for pain Follow up with PCP if symptoms persists Return or go to the ED if you have any new or worsening symptoms such as fever, chills, worsening sinus pain/pressure, cough, sore throat, chest pain, shortness of breath, abdominal pain, changes in bowel or bladder habits, etc..[name]

## 2018-03-15 NOTE — ED Triage Notes (Signed)
Patient complains of diarrhea.  Patient has had sinus congestion, left ear discomfort and fever.  Patient thought she was getting better with home remedies.  Now getting worse.  Chest congestion, nose stuffy, and voice sounding different

## 2018-03-15 NOTE — ED Provider Notes (Signed)
[facility]   [id number] 03/15/18 Arrival Time: 1002   CC: URI symptoms   SUBJECTIVE: History from: patient.  Cheryl Prince is a 63 y.o. female who presents with nasal congestion, sinus pain/ pressure, PND, and cough x 3 weeks.  Denies known sick exposure or precipitating event.  Has tried OTC medications without relief.  Symptoms are made worse at night.  Reports previous symptoms in the past.   Denies fever, chills, fatigue,  sore throat, SOB, wheezing, chest pain, nausea, changes in bowel or bladder habits.    Received flu shot this year: no.  ROS: As per HPI.  Past Medical History:  Diagnosis Date  . Bronchitis   . Hypertension    Past Surgical History:  Procedure Laterality Date  . ABDOMINAL HYSTERECTOMY    . BREAST SURGERY    . CHOLECYSTECTOMY     Allergies  Allergen Reactions  . Morphine And Related Swelling  . Penicillins Swelling  . Pine Shortness Of Breath  . Grapeseed Extract [Nutritional Supplements]   . Sulfites Rash   No current facility-administered medications on file prior to encounter.    Current Outpatient Medications on File Prior to Encounter  Medication Sig Dispense Refill  . cetirizine (ZYRTEC ALLERGY) 10 MG tablet Take 1 tablet (10 mg total) by mouth daily. 90 tablet 1  . cyclobenzaprine (FLEXERIL) 10 MG tablet Take 1 tablet (10 mg total) by mouth 3 (three) times daily as needed for muscle spasms. 30 tablet 0  . gabapentin (NEURONTIN) 300 MG capsule Take 1 capsule (300 mg total) by mouth 3 (three) times daily. 90 capsule 3  . hydrochlorothiazide (HYDRODIURIL) 25 MG tablet TAKE 1 TABLET BY MOUTH  DAILY IN THE MORNING. 90 tablet 1  . lamoTRIgine (LAMICTAL) 25 MG tablet TAKE 2 TABLETS BY MOUTH  DAILY 180 tablet 0  . losartan (COZAAR) 100 MG tablet Take 1 tablet (100 mg total) by mouth daily. 90 tablet 1  . NON FORMULARY     . prochlorperazine (COMPAZINE) 5 MG tablet Take 1 tablet (5 mg total) by mouth every 6 (six) hours as needed for  nausea or vomiting. 30 tablet 0  . FLOVENT HFA 110 MCG/ACT inhaler INL 2 PUFFS PO BID  1   Social History   Socioeconomic History  . Marital status: Single    Spouse name: Not on file  . Number of children: Not on file  . Years of education: Not on file  . Highest education level: Not on file  Occupational History  . Not on file  Social Needs  . Financial resource strain: Not on file  . Food insecurity:    Worry: Not on file    Inability: Not on file  . Transportation needs:    Medical: Not on file    Non-medical: Not on file  Tobacco Use  . Smoking status: Former Games developer  . Smokeless tobacco: Never Used  . Tobacco comment: quit 1986  Substance and Sexual Activity  . Alcohol use: Yes  . Drug use: No  . Sexual activity: Not on file  Lifestyle  . Physical activity:    Days per week: Not on file    Minutes per session: Not on file  . Stress: Not on file  Relationships  . Social connections:    Talks on phone: Not on file    Gets together: Not on file    Attends religious service: Not on file    Active member of club or organization: Not on file  Attends meetings of clubs or organizations: Not on file    Relationship status: Not on file  . Intimate partner violence:    Fear of current or ex partner: Not on file    Emotionally abused: Not on file    Physically abused: Not on file    Forced sexual activity: Not on file  Other Topics Concern  . Not on file  Social History Narrative  . Not on file   Family History  Problem Relation Age of Onset  . Hypertension Father   . Diabetes Father     OBJECTIVE:  Vitals:   03/15/18 1030  BP: 121/77  Pulse: 92  Resp: 18  Temp: 97.9 F (36.6 C)  TempSrc: Oral  SpO2: 99%     General appearance: alert; appears mildly fatigued, but nontoxic; speaking in full sentences and tolerating own secretions HEENT: NCAT; Ears: EACs clear, TMs pearly gray; Eyes: PERRL.  EOM grossly intact. Sinuses: TTP over RT maxillary sinus;  Nose: nares patent without rhinorrhea, turbinates erythematous and swollen, Throat: oropharynx clear, tonsils non erythematous or enlarged, uvula midline  Neck: supple without LAD; left sided pre-auricular LAD Lungs: unlabored respirations, symmetrical air entry; cough: mild; no respiratory distress; CTAB Heart: regular rate and rhythm.  Radial pulses 2+ symmetrical bilaterally Skin: warm and dry Psychological: alert and cooperative; normal mood and affect  ASSESSMENT & PLAN:  1. Acute non-recurrent maxillary sinusitis     Meds ordered this encounter  Medications  . doxycycline (VIBRAMYCIN) 100 MG capsule    Sig: Take 1 capsule (100 mg total) by mouth 2 (two) times daily.    Dispense:  20 capsule    Refill:  0    Order Specific Question:   Supervising Provider    Answer:   Eustace Moore [[id number]]   Rest and push fluids Use OTC flonase and/or zyrtec as needed for symptomatic relief Doxycycline prescribed.  Take as directed and to completion Continue with OTC ibuprofen/tylenol as needed for pain Follow up with PCP if symptoms persists Return or go to the ED if you have any new or worsening symptoms such as fever, chills, worsening sinus pain/pressure, cough, sore throat, chest pain, shortness of breath, abdominal pain, changes in bowel or bladder habits, etc...   Reviewed expectations re: course of current medical issues. Questions answered. Outlined signs and symptoms indicating need for more acute intervention. Patient verbalized understanding. After Visit Summary given.         Rennis Harding, PA-C 03/15/18 1139

## 2018-03-16 NOTE — Progress Notes (Signed)
Please call patient with results. Thankspls order cta abdomen pelvis thx for renal cyst

## 2018-03-17 ENCOUNTER — Other Ambulatory Visit (INDEPENDENT_AMBULATORY_CARE_PROVIDER_SITE_OTHER): Payer: Self-pay | Admitting: Radiology

## 2018-03-17 DIAGNOSIS — N2889 Other specified disorders of kidney and ureter: Secondary | ICD-10-CM

## 2018-03-18 ENCOUNTER — Ambulatory Visit: Payer: Medicare Other | Admitting: Physical Therapy

## 2018-03-20 ENCOUNTER — Ambulatory Visit (INDEPENDENT_AMBULATORY_CARE_PROVIDER_SITE_OTHER): Payer: Medicare Other | Admitting: Orthopedic Surgery

## 2018-03-20 NOTE — Progress Notes (Signed)
I have tried calling pt numerous times and vm not set up to inform her of the test results and to advise that provider is wanting to do a CT abdomen.

## 2018-03-24 ENCOUNTER — Ambulatory Visit
Admission: RE | Admit: 2018-03-24 | Discharge: 2018-03-24 | Disposition: A | Discharge status: Home / Self Care | Payer: Medicare Other | Source: Ambulatory Visit | Attending: Orthopedic Surgery | Admitting: Orthopedic Surgery

## 2018-03-24 DIAGNOSIS — N2889 Other specified disorders of kidney and ureter: Secondary | ICD-10-CM

## 2018-03-24 MED ORDER — IOPAMIDOL (ISOVUE-370) INJECTION 76%
75.0000 mL | Freq: Once | INTRAVENOUS | Status: AC | PRN
  Administered 2018-03-24: 75 mL via INTRAVENOUS

## 2018-05-08 ENCOUNTER — Ambulatory Visit (INDEPENDENT_AMBULATORY_CARE_PROVIDER_SITE_OTHER): Payer: Self-pay | Admitting: Primary Care

## 2018-05-12 ENCOUNTER — Ambulatory Visit (INDEPENDENT_AMBULATORY_CARE_PROVIDER_SITE_OTHER): Payer: Medicare Other | Admitting: Primary Care

## 2018-05-12 ENCOUNTER — Other Ambulatory Visit: Payer: Self-pay

## 2018-05-12 ENCOUNTER — Encounter (INDEPENDENT_AMBULATORY_CARE_PROVIDER_SITE_OTHER): Payer: Self-pay | Admitting: Primary Care

## 2018-05-12 VITALS — BP 145/91 | HR 77 | Temp 98.5°F | Resp 16 | Wt 219.0 lb

## 2018-05-12 DIAGNOSIS — R7303 Prediabetes: Secondary | ICD-10-CM

## 2018-05-12 DIAGNOSIS — R0602 Shortness of breath: Secondary | ICD-10-CM

## 2018-05-12 DIAGNOSIS — I10 Essential (primary) hypertension: Secondary | ICD-10-CM

## 2018-05-12 DIAGNOSIS — F4310 Post-traumatic stress disorder, unspecified: Secondary | ICD-10-CM

## 2018-05-12 DIAGNOSIS — M545 Low back pain, unspecified: Secondary | ICD-10-CM

## 2018-05-12 DIAGNOSIS — R112 Nausea with vomiting, unspecified: Secondary | ICD-10-CM

## 2018-05-12 NOTE — Progress Notes (Signed)
Needs RF:  Flexeril flovent Compazine  Taking blood pressure at home Has not taken Losartan- 3 mos.  SBP- 120's DBP: 80

## 2018-05-13 MED ORDER — CYCLOBENZAPRINE HCL 10 MG PO TABS: 10.0000 mg | ORAL_TABLET | Freq: Three times a day (TID) | ORAL | 0 refills | Status: DC | PRN

## 2018-05-13 MED ORDER — FLOVENT HFA 110 MCG/ACT IN AERO: 2.0000 | INHALATION_SPRAY | Freq: Two times a day (BID) | RESPIRATORY_TRACT | 1 refills | Status: DC | PRN

## 2018-05-13 MED ORDER — PROCHLORPERAZINE MALEATE 5 MG PO TABS: 5.0000 mg | ORAL_TABLET | Freq: Four times a day (QID) | ORAL | 0 refills | Status: DC | PRN

## 2018-05-27 ENCOUNTER — Encounter: Payer: Self-pay | Admitting: Primary Care

## 2018-05-27 ENCOUNTER — Other Ambulatory Visit: Payer: Self-pay

## 2018-05-27 ENCOUNTER — Ambulatory Visit: Payer: Medicare Other | Attending: Primary Care | Admitting: Primary Care

## 2018-05-27 ENCOUNTER — Ambulatory Visit (INDEPENDENT_AMBULATORY_CARE_PROVIDER_SITE_OTHER): Payer: Self-pay

## 2018-05-27 VITALS — BP 145/97 | HR 74 | Temp 99.1°F | Resp 16 | Ht 64.0 in | Wt 223.0 lb

## 2018-05-27 DIAGNOSIS — Z23 Encounter for immunization: Secondary | ICD-10-CM | POA: Diagnosis not present

## 2018-05-27 DIAGNOSIS — F1099 Alcohol use, unspecified with unspecified alcohol-induced disorder: Secondary | ICD-10-CM | POA: Insufficient documentation

## 2018-05-27 DIAGNOSIS — I10 Essential (primary) hypertension: Secondary | ICD-10-CM

## 2018-05-27 DIAGNOSIS — M1712 Unilateral primary osteoarthritis, left knee: Secondary | ICD-10-CM

## 2018-05-27 DIAGNOSIS — Z8659 Personal history of other mental and behavioral disorders: Secondary | ICD-10-CM

## 2018-05-27 DIAGNOSIS — Z1239 Encounter for other screening for malignant neoplasm of breast: Secondary | ICD-10-CM

## 2018-05-27 MED ORDER — AMLODIPINE-VALSARTAN-HCTZ 10-160-12.5 MG PO TABS: 30.0000 mg | ORAL_TABLET | Freq: Every morning | ORAL | 3 refills | Status: DC

## 2018-05-27 NOTE — Patient Instructions (Signed)

## 2018-05-27 NOTE — Progress Notes (Signed)
Established Patient Office Visit  Subjective:  Patient ID: Cheryl Prince, female    DOB: 12-Dec-1955  Age: 63 y.o. MRN: [id number]  CC:  Chief Complaint  Patient presents with  . Blood Pressure Check    HPI Cheryl Prince presents for Bp check remains elevated she states she was upset her father is in [place]. Unable to physically check on him. She demonstrates high anxiety we  discussed the need for to follow up with Dr. Lolly Mustache psychiatrist.  Past Medical History:  Diagnosis Date  . Bronchitis   . Hypertension     Past Surgical History:  Procedure Laterality Date  . ABDOMINAL HYSTERECTOMY    . BREAST SURGERY    . CHOLECYSTECTOMY      Family History  Problem Relation Age of Onset  . Hypertension Father   . Diabetes Father     Social History   Socioeconomic History  . Marital status: Single    Spouse name: Not on file  . Number of children: Not on file  . Years of education: Not on file  . Highest education level: Not on file  Occupational History  . Not on file  Social Needs  . Financial resource strain: Not on file  . Food insecurity:    Worry: Not on file    Inability: Not on file  . Transportation needs:    Medical: Not on file    Non-medical: Not on file  Tobacco Use  . Smoking status: Former Games developer  . Smokeless tobacco: Never Used  . Tobacco comment: quit 1986  Substance and Sexual Activity  . Alcohol use: Yes  . Drug use: No  . Sexual activity: Not on file  Lifestyle  . Physical activity:    Days per week: Not on file    Minutes per session: Not on file  . Stress: Not on file  Relationships  . Social connections:    Talks on phone: Not on file    Gets together: Not on file    Attends religious service: Not on file    Active member of club or organization: Not on file    Attends meetings of clubs or organizations: Not on file    Relationship status: Not on file  . Intimate partner violence:    Fear of current or ex partner: Not on file     Emotionally abused: Not on file    Physically abused: Not on file    Forced sexual activity: Not on file  Other Topics Concern  . Not on file  Social History Narrative  . Not on file    Outpatient Medications Prior to Visit  Medication Sig Dispense Refill  . cetirizine (ZYRTEC ALLERGY) 10 MG tablet Take 1 tablet (10 mg total) by mouth daily. 90 tablet 1  . cyclobenzaprine (FLEXERIL) 10 MG tablet Take 1 tablet (10 mg total) by mouth 3 (three) times daily as needed for muscle spasms. 30 tablet 0  . FLOVENT HFA 110 MCG/ACT inhaler Inhale 2 puffs into the lungs 2 (two) times daily as needed for up to 30 days. 1 Inhaler 1  . gabapentin (NEURONTIN) 300 MG capsule Take 1 capsule (300 mg total) by mouth 3 (three) times daily. 90 capsule 3  . lamoTRIgine (LAMICTAL) 25 MG tablet TAKE 2 TABLETS BY MOUTH  DAILY 180 tablet 0  . NON FORMULARY     . prochlorperazine (COMPAZINE) 5 MG tablet Take 1 tablet (5 mg total) by mouth every 6 (six) hours as needed for  nausea or vomiting. 30 tablet 0  . hydrochlorothiazide (HYDRODIURIL) 25 MG tablet TAKE 1 TABLET BY MOUTH  DAILY IN THE MORNING. 90 tablet 1  . losartan (COZAAR) 100 MG tablet Take 1 tablet (100 mg total) by mouth daily. 90 tablet 1   No facility-administered medications prior to visit.     Allergies  Allergen Reactions  . Morphine And Related Swelling  . Penicillins Swelling  . Pine Shortness Of Breath  . Grapeseed Extract [Nutritional Supplements]   . Sulfites Rash    ROS Review of Systems  Constitutional: Positive for fatigue.  HENT: Negative.   Eyes: Negative.   Respiratory: Positive for shortness of breath.   Cardiovascular: Negative.   Gastrointestinal: Negative.   Endocrine: Negative.   Genitourinary: Negative.   Musculoskeletal: Positive for back pain and joint swelling.  Skin: Negative.   Allergic/Immunologic: Negative.   Neurological: Positive for headaches.  Hematological: Negative.   Psychiatric/Behavioral: Positive  for agitation and sleep disturbance. The patient is nervous/anxious.       Objective:    Physical Exam  Constitutional: She is oriented to person, place, and time. She appears well-developed and well-nourished.  HENT:  Head: Normocephalic and atraumatic.  Cardiovascular: Normal rate and regular rhythm.  Pulmonary/Chest: Effort normal and breath sounds normal.  Abdominal: Bowel sounds are normal.  Musculoskeletal:     Comments: Bilateral arthritis left knee needs a TKR  Neurological: She is alert and oriented to person, place, and time.  Psychiatric:  Increase anxiety with COVID 19 unable to ck on her father. Her disable daughter is driving her crazy.    BP (!) 145/97 (BP Location: Right Arm, Patient Position: Sitting, Cuff Size: Normal)   Pulse 74   Temp 99.1 F (37.3 C) (Oral)   Resp 16   Ht  (1.626 m)   Wt 223 lb (101.2 kg)   SpO2 98%   BMI 38.28 kg/m  Wt Readings from Last 3 Encounters:  05/27/18 223 lb (101.2 kg)  05/12/18 219 lb (99.3 kg)  12/30/17 213 lb 6.4 oz (96.8 kg)     Health Maintenance Due  Topic Date Due  . MAMMOGRAM  03/25/2018    There are no preventive care reminders to display for this patient.  Lab Results  Component Value Date   TSH 1.210 12/11/2016   Lab Results  Component Value Date   WBC 3.6 12/31/2017   HGB 10.6 (L) 12/31/2017   HCT 34.2 12/31/2017   MCV 78 (L) 12/31/2017   PLT 254 12/31/2017   Lab Results  Component Value Date   NA 138 12/31/2017   K 3.4 (L) 12/31/2017   CO2 25 12/31/2017   GLUCOSE 96 12/31/2017   BUN 17 12/31/2017   CREATININE 0.72 12/31/2017   BILITOT 0.6 12/31/2017   ALKPHOS 110 12/31/2017   AST 23 12/31/2017   ALT 15 12/31/2017   PROT 7.2 12/31/2017   ALBUMIN 3.9 12/31/2017   CALCIUM 8.9 12/31/2017   Lab Results  Component Value Date   CHOL 158 12/31/2017   Lab Results  Component Value Date   HDL 71 12/31/2017   Lab Results  Component Value Date   LDLCALC 66 12/31/2017   Lab Results   Component Value Date   TRIG 103 12/31/2017   Lab Results  Component Value Date   CHOLHDL 2.2 12/31/2017   No results found for: HGBA1C    Assessment & Plan:   Problem List Items Addressed This Visit    Hypertension   Relevant  Medications   amLODIPine-Valsartan-HCTZ 10-160-12.5 MG TABS   Osteoarthritis of left knee - Primary   Alcohol use, unspecified with unspecified alcohol-induced disorder (HCC)      Meds ordered this encounter  Medications  . amLODIPine-Valsartan-HCTZ 10-160-12.5 MG TABS    Sig: Take 30 mg by mouth every morning for 30 days.    Dispense:  30 tablet    Refill:  3    Follow-up: No follow-ups on file.    Grayce Sessions, NP

## 2018-05-29 ENCOUNTER — Other Ambulatory Visit: Payer: Self-pay | Admitting: Pharmacist

## 2018-05-29 MED ORDER — AMLODIPINE-VALSARTAN-HCTZ 10-160-12.5 MG PO TABS: ORAL_TABLET | ORAL | 3 refills | Status: DC

## 2018-06-08 ENCOUNTER — Ambulatory Visit (INDEPENDENT_AMBULATORY_CARE_PROVIDER_SITE_OTHER): Payer: Medicare Other | Admitting: Psychiatry

## 2018-06-08 ENCOUNTER — Other Ambulatory Visit: Payer: Self-pay

## 2018-06-08 DIAGNOSIS — F4310 Post-traumatic stress disorder, unspecified: Secondary | ICD-10-CM | POA: Diagnosis not present

## 2018-06-08 DIAGNOSIS — F319 Bipolar disorder, unspecified: Secondary | ICD-10-CM

## 2018-06-08 MED ORDER — LAMOTRIGINE 25 MG PO TABS: ORAL_TABLET | ORAL | 0 refills | Status: DC
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
## 2018-06-30 ENCOUNTER — Other Ambulatory Visit: Payer: Self-pay

## 2018-06-30 ENCOUNTER — Ambulatory Visit: Payer: Medicare Other | Attending: Primary Care

## 2018-06-30 DIAGNOSIS — I10 Essential (primary) hypertension: Secondary | ICD-10-CM

## 2018-06-30 DIAGNOSIS — F1099 Alcohol use, unspecified with unspecified alcohol-induced disorder: Secondary | ICD-10-CM

## 2018-07-01 LAB — COMPREHENSIVE METABOLIC PANEL
ALT: 18 IU/L (ref 0–32)
AST: 25 IU/L (ref 0–40)
Albumin/Globulin Ratio: 1.3 (ref 1.2–2.2)
Albumin: 4 g/dL (ref 3.8–4.8)
Alkaline Phosphatase: 143 IU/L — ABNORMAL HIGH (ref 39–117)
BUN/Creatinine Ratio: 22 (ref 12–28)
BUN: 17 mg/dL (ref 8–27)
Bilirubin Total: 0.4 mg/dL (ref 0.0–1.2)
CO2: 22 mmol/L (ref 20–29)
Calcium: 8.9 mg/dL (ref 8.7–10.3)
Chloride: 102 mmol/L (ref 96–106)
Creatinine, Ser: 0.78 mg/dL (ref 0.57–1.00)
GFR calc Af Amer: 93 mL/min/{1.73_m2} (ref >59)
GFR calc non Af Amer: 81 mL/min/{1.73_m2} (ref >59)
Globulin, Total: 3.2 g/dL (ref 1.5–4.5)
Glucose: 90 mg/dL (ref 65–99)
Potassium: 3.7 mmol/L (ref 3.5–5.2)
Sodium: 137 mmol/L (ref 134–144)
Total Protein: 7.2 g/dL (ref 6.0–8.5)

## 2018-07-01 LAB — CBC WITH DIFFERENTIAL/PLATELET
Basophils Absolute: 0 10*3/uL (ref 0.0–0.2)
Basos: 1 %
EOS (ABSOLUTE): 0.1 10*3/uL (ref 0.0–0.4)
Eos: 1 %
Hematocrit: 33.4 % — ABNORMAL LOW (ref 34.0–46.6)
Hemoglobin: 10.7 g/dL — ABNORMAL LOW (ref 11.1–15.9)
Immature Grans (Abs): 0 10*3/uL (ref 0.0–0.1)
Immature Granulocytes: 0 %
Lymphocytes Absolute: 1.7 10*3/uL (ref 0.7–3.1)
Lymphs: 38 %
MCH: 24 pg — ABNORMAL LOW (ref 26.6–33.0)
MCHC: 32 g/dL (ref 31.5–35.7)
MCV: 75 fL — ABNORMAL LOW (ref 79–97)
Monocytes Absolute: 0.4 10*3/uL (ref 0.1–0.9)
Monocytes: 9 %
Neutrophils Absolute: 2.4 10*3/uL (ref 1.4–7.0)
Neutrophils: 51 %
Platelets: 232 10*3/uL (ref 150–450)
RBC: 4.45 x10E6/uL (ref 3.77–5.28)
RDW: 15.8 % — ABNORMAL HIGH (ref 11.7–15.4)
WBC: 4.6 10*3/uL (ref 3.4–10.8)

## 2018-07-01 LAB — LIPID PANEL
Chol/HDL Ratio: 2.2 ratio (ref 0.0–4.4)
Cholesterol, Total: 146 mg/dL (ref 100–199)
HDL: 67 mg/dL (ref >39)
LDL Calculated: 65 mg/dL (ref 0–99)
Triglycerides: 70 mg/dL (ref 0–149)
VLDL Cholesterol Cal: 14 mg/dL (ref 5–40)

## 2018-07-02 ENCOUNTER — Other Ambulatory Visit: Payer: Self-pay | Admitting: Primary Care

## 2018-07-02 ENCOUNTER — Telehealth: Payer: Self-pay | Admitting: *Deleted

## 2018-07-02 MED ORDER — SENNOSIDES-DOCUSATE SODIUM 8.6-50 MG PO TABS: 1.0000 | ORAL_TABLET | Freq: Every evening | ORAL | 3 refills | Status: AC | PRN

## 2018-07-02 MED ORDER — FERROUS SULFATE 325 (65 FE) MG PO TABS: 325.0000 mg | ORAL_TABLET | Freq: Every day | ORAL | 3 refills | Status: DC

## 2018-07-02 NOTE — Telephone Encounter (Signed)
-----   Message from Grayce Sessions, NP sent at 07/02/2018  1:07 PM EDT ----- Iron def anemia sent in meds and constipation if needed

## 2018-07-02 NOTE — Telephone Encounter (Signed)
Medical Assistant left message on patient's home and cell voicemail. Voicemail states to give a call back to [place] with [facility] at [phone]. Patient is aware of anemia being present and iron supplement being sent in to the pharmacy. Patient is also aware of constipation medication being sent in case this is a concern after taking the supplement daily

## 2018-07-06 ENCOUNTER — Other Ambulatory Visit: Payer: Self-pay

## 2018-07-06 ENCOUNTER — Encounter: Payer: Self-pay | Admitting: Primary Care

## 2018-07-06 ENCOUNTER — Ambulatory Visit: Payer: Medicare Other | Attending: Primary Care | Admitting: Primary Care

## 2018-07-06 VITALS — BP 128/79 | HR 79 | Temp 98.5°F | Ht 64.0 in | Wt 228.4 lb

## 2018-07-06 DIAGNOSIS — I10 Essential (primary) hypertension: Secondary | ICD-10-CM | POA: Diagnosis not present

## 2018-07-06 DIAGNOSIS — Z760 Encounter for issue of repeat prescription: Secondary | ICD-10-CM

## 2018-07-06 DIAGNOSIS — M545 Low back pain, unspecified: Secondary | ICD-10-CM

## 2018-07-06 DIAGNOSIS — R0602 Shortness of breath: Secondary | ICD-10-CM

## 2018-07-06 DIAGNOSIS — R112 Nausea with vomiting, unspecified: Secondary | ICD-10-CM

## 2018-07-06 MED ORDER — CYCLOBENZAPRINE HCL 10 MG PO TABS: 10.0000 mg | ORAL_TABLET | Freq: Three times a day (TID) | ORAL | 1 refills | Status: DC | PRN

## 2018-07-06 MED ORDER — FAMOTIDINE 20 MG PO TABS: 20.0000 mg | ORAL_TABLET | Freq: Two times a day (BID) | ORAL | 3 refills | Status: DC

## 2018-07-06 MED ORDER — PROCHLORPERAZINE MALEATE 5 MG PO TABS: 5.0000 mg | ORAL_TABLET | Freq: Four times a day (QID) | ORAL | 0 refills | Status: DC | PRN

## 2018-07-06 MED ORDER — GABAPENTIN 300 MG PO CAPS: 300.0000 mg | ORAL_CAPSULE | Freq: Three times a day (TID) | ORAL | 3 refills | Status: DC

## 2018-07-06 MED ORDER — ALBUTEROL SULFATE HFA 108 (90 BASE) MCG/ACT IN AERS: 2.0000 | INHALATION_SPRAY | Freq: Four times a day (QID) | RESPIRATORY_TRACT | 0 refills | Status: DC | PRN

## 2018-07-06 MED ORDER — FLOVENT HFA 110 MCG/ACT IN AERO: 2.0000 | INHALATION_SPRAY | Freq: Two times a day (BID) | RESPIRATORY_TRACT | 3 refills | Status: DC | PRN

## 2018-07-06 MED ORDER — HYDROXYZINE HCL 10 MG PO TABS: 10.0000 mg | ORAL_TABLET | Freq: Every day | ORAL | 3 refills | Status: DC | PRN

## 2018-07-06 NOTE — Progress Notes (Signed)
Established Patient Office Visit  Subjective:  Patient ID: Cheryl Prince, female    DOB: Jul 03, 1955  Age: 63 y.o. MRN: [id number]  CC: Concern with weight gain  HPI Cheryl Prince presents for HTN f/u.  She admits to increased anxiety due to pandemic coronavirus.  She also trying to exercise by using her treadmill.  She is very excited that her daughter has her own apartment and moving out this week. Patient informs me that she had fallen and her wrist remains swollen and painful. She has purchased a wrist brace . She can move her fingers and move her wrist.   Past Medical History:  Diagnosis Date  . Bronchitis   . Hypertension     Past Surgical History:  Procedure Laterality Date  . ABDOMINAL HYSTERECTOMY    . BREAST SURGERY    . CHOLECYSTECTOMY      Family History  Problem Relation Age of Onset  . Hypertension Father   . Diabetes Father     Social History   Socioeconomic History  . Marital status: Single    Spouse name: Not on file  . Number of children: Not on file  . Years of education: Not on file  . Highest education level: Not on file  Occupational History  . Not on file  Social Needs  . Financial resource strain: Not on file  . Food insecurity:    Worry: Not on file    Inability: Not on file  . Transportation needs:    Medical: Not on file    Non-medical: Not on file  Tobacco Use  . Smoking status: Former Games developermoker  . Smokeless tobacco: Never Used  . Tobacco comment: quit 1986  Substance and Sexual Activity  . Alcohol use: Yes  . Drug use: No  . Sexual activity: Not on file  Lifestyle  . Physical activity:    Days per week: Not on file    Minutes per session: Not on file  . Stress: Not on file  Relationships  . Social connections:    Talks on phone: Not on file    Gets together: Not on file    Attends religious service: Not on file    Active member of club or organization: Not on file    Attends meetings of clubs or organizations: Not on  file    Relationship status: Not on file  . Intimate partner violence:    Fear of current or ex partner: Not on file    Emotionally abused: Not on file    Physically abused: Not on file    Forced sexual activity: Not on file  Other Topics Concern  . Not on file  Social History Narrative  . Not on file    Outpatient Medications Prior to Visit  Medication Sig Dispense Refill  . amLODIPine-Valsartan-HCTZ 10-160-12.5 MG TABS Take 1 tablet by mouth daily. 30 tablet 3  . cetirizine (ZYRTEC ALLERGY) 10 MG tablet Take 1 tablet (10 mg total) by mouth daily. 90 tablet 1  . cyclobenzaprine (FLEXERIL) 10 MG tablet Take 1 tablet (10 mg total) by mouth 3 (three) times daily as needed for muscle spasms. 30 tablet 0  . ferrous sulfate 325 (65 FE) MG tablet Take 1 tablet (325 mg total) by mouth daily with breakfast for 30 days. 30 tablet 3  . FLOVENT HFA 110 MCG/ACT inhaler Inhale 2 puffs into the lungs 2 (two) times daily as needed for up to 30 days. 1 Inhaler 1  . gabapentin (NEURONTIN)  300 MG capsule Take 1 capsule (300 mg total) by mouth 3 (three) times daily. 90 capsule 3  . lamoTRIgine (LAMICTAL) 25 MG tablet TAKE 2 TABLETS BY MOUTH  DAILY 180 tablet 0  . NON FORMULARY     . prochlorperazine (COMPAZINE) 5 MG tablet Take 1 tablet (5 mg total) by mouth every 6 (six) hours as needed for nausea or vomiting. 30 tablet 0  . senna-docusate (SENOKOT-S) 8.6-50 MG tablet Take 1 tablet by mouth at bedtime as needed for up to 30 days for mild constipation. 30 tablet 3   No facility-administered medications prior to visit.     Allergies  Allergen Reactions  . Morphine And Related Swelling  . Penicillins Swelling  . Pine Shortness Of Breath  . Grapeseed Extract [Nutritional Supplements]   . Sulfites Rash    ROS Review of Systems  Constitutional: Positive for activity change.       Started back working  HENT: Negative.   Eyes: Negative.   Respiratory: Positive for shortness of breath.    Cardiovascular: Negative.   Gastrointestinal: Positive for abdominal distention.  Endocrine: Positive for polyphagia.  Genitourinary: Negative.   Musculoskeletal: Positive for joint swelling.  Skin: Negative.   Allergic/Immunologic: Negative.   Neurological: Negative.   Hematological: Negative.   Psychiatric/Behavioral: Positive for agitation. The patient is nervous/anxious.       Objective:    Physical Exam  Constitutional: She is oriented to person, place, and time. She appears well-developed and well-nourished.  HENT:  Head: Normocephalic and atraumatic.  Eyes: EOM are normal.  Neck: Normal range of motion. Neck supple.  Pulmonary/Chest: Breath sounds normal.  Abdominal: Bowel sounds are normal. She exhibits distension.  Musculoskeletal: Normal range of motion.  Neurological: She is oriented to person, place, and time.  Skin: Skin is dry.  Psychiatric: She has a normal mood and affect.    There were no vitals taken for this visit. Wt Readings from Last 3 Encounters:  05/27/18 223 lb (101.2 kg)  05/12/18 219 lb (99.3 kg)  12/30/17 213 lb 6.4 oz (96.8 kg)     Health Maintenance Due  Topic Date Due  . MAMMOGRAM  03/25/2018    There are no preventive care reminders to display for this patient.  Lab Results  Component Value Date   TSH 1.210 12/11/2016   Lab Results  Component Value Date   WBC 4.6 06/30/2018   HGB 10.7 (L) 06/30/2018   HCT 33.4 (L) 06/30/2018   MCV 75 (L) 06/30/2018   PLT 232 06/30/2018   Lab Results  Component Value Date   NA 137 06/30/2018   K 3.7 06/30/2018   CO2 22 06/30/2018   GLUCOSE 90 06/30/2018   BUN 17 06/30/2018   CREATININE 0.78 06/30/2018   BILITOT 0.4 06/30/2018   ALKPHOS 143 (H) 06/30/2018   AST 25 06/30/2018   ALT 18 06/30/2018   PROT 7.2 06/30/2018   ALBUMIN 4.0 06/30/2018   CALCIUM 8.9 06/30/2018   Lab Results  Component Value Date   CHOL 146 06/30/2018   Lab Results  Component Value Date   HDL 67  06/30/2018   Lab Results  Component Value Date   LDLCALC 65 06/30/2018   Lab Results  Component Value Date   TRIG 70 06/30/2018   Lab Results  Component Value Date   CHOLHDL 2.2 06/30/2018   No results found for: HGBA1C    Assessment & Plan:   Problem List Items Addressed This Visit    None  Diagnoses and all orders for this visit:  Essential hypertension  Acute right-sided low back pain without sciatica -     cyclobenzaprine (FLEXERIL) 10 MG tablet; Take 1 tablet (10 mg total) by mouth 3 (three) times daily as needed for muscle spasms.  Shortness of breath -     FLOVENT HFA 110 MCG/ACT inhaler; Inhale 2 puffs into the lungs 2 (two) times daily as needed for up to 30 days.  Medication refill -     gabapentin (NEURONTIN) 300 MG capsule; Take 1 capsule (300 mg total) by mouth 3 (three) times daily.  Nausea and vomiting, intractability of vomiting not specified, unspecified vomiting type -     prochlorperazine (COMPAZINE) 5 MG tablet; Take 1 tablet (5 mg total) by mouth every 6 (six) hours as needed for nausea or vomiting.  Other orders -     albuterol (VENTOLIN HFA) 108 (90 Base) MCG/ACT inhaler; Inhale 2 puffs into the lungs every 6 (six) hours as needed for wheezing or shortness of breath. -     famotidine (PEPCID) 20 MG tablet; Take 1 tablet (20 mg total) by mouth 2 (two) times daily. -     hydrOXYzine (ATARAX/VISTARIL) 10 MG tablet; Take 1 tablet (10 mg total) by mouth daily as needed for up to 30 days for itching or anxiety.    No orders of the defined types were placed in this encounter.   Follow-up: No follow-ups on file.    Grayce Sessions, NP

## 2018-07-08 ENCOUNTER — Other Ambulatory Visit: Payer: Self-pay | Admitting: Pharmacist

## 2018-07-08 MED ORDER — ALBUTEROL SULFATE HFA 108 (90 BASE) MCG/ACT IN AERS: 2.0000 | INHALATION_SPRAY | Freq: Four times a day (QID) | RESPIRATORY_TRACT | 2 refills | Status: DC | PRN

## 2018-07-10 ENCOUNTER — Other Ambulatory Visit: Payer: Self-pay | Admitting: Primary Care

## 2018-07-10 ENCOUNTER — Telehealth: Payer: Self-pay | Admitting: Primary Care

## 2018-07-10 DIAGNOSIS — M1712 Unilateral primary osteoarthritis, left knee: Secondary | ICD-10-CM

## 2018-07-10 NOTE — Telephone Encounter (Signed)
Patient called stating she would Like to be referred a nurse to help her to take a bath and due to her bad knee and ankle and states that when she stands too long it hurts and causes her issues. Please follow up.

## 2018-07-10 NOTE — Telephone Encounter (Signed)
Needs to evaluated by PT/OT for needs assessment orders placed

## 2018-07-10 NOTE — Telephone Encounter (Signed)
Patients call returned.  Patient identified by name and date of birth.  Patient told about PT/OT order for an          Assessment.  Patient acknowledged understanding of advice.

## 2018-07-28 ENCOUNTER — Ambulatory Visit
Admission: RE | Admit: 2018-07-28 | Discharge: 2018-07-28 | Disposition: A | Discharge status: Home / Self Care | Payer: Medicare Other | Source: Ambulatory Visit | Attending: Primary Care | Admitting: Primary Care

## 2018-07-28 ENCOUNTER — Other Ambulatory Visit: Payer: Self-pay

## 2018-07-28 DIAGNOSIS — Z1239 Encounter for other screening for malignant neoplasm of breast: Secondary | ICD-10-CM

## 2018-08-04 ENCOUNTER — Encounter: Payer: Self-pay | Admitting: Physical Therapy

## 2018-08-04 ENCOUNTER — Ambulatory Visit: Payer: Medicare Other | Attending: Primary Care | Admitting: Physical Therapy

## 2018-08-04 ENCOUNTER — Other Ambulatory Visit: Payer: Self-pay

## 2018-08-04 DIAGNOSIS — R2689 Other abnormalities of gait and mobility: Secondary | ICD-10-CM | POA: Diagnosis present

## 2018-08-04 DIAGNOSIS — G8929 Other chronic pain: Secondary | ICD-10-CM | POA: Diagnosis present

## 2018-08-04 DIAGNOSIS — M25562 Pain in left knee: Secondary | ICD-10-CM | POA: Insufficient documentation

## 2018-08-04 DIAGNOSIS — M6281 Muscle weakness (generalized): Secondary | ICD-10-CM | POA: Insufficient documentation

## 2018-08-05 ENCOUNTER — Ambulatory Visit: Payer: Medicare Other | Admitting: Physical Therapy

## 2018-08-05 ENCOUNTER — Encounter: Payer: Self-pay | Admitting: Physical Therapy

## 2018-08-05 DIAGNOSIS — M25562 Pain in left knee: Secondary | ICD-10-CM | POA: Diagnosis not present

## 2018-08-05 DIAGNOSIS — R2689 Other abnormalities of gait and mobility: Secondary | ICD-10-CM

## 2018-08-05 DIAGNOSIS — M6281 Muscle weakness (generalized): Secondary | ICD-10-CM

## 2018-08-05 DIAGNOSIS — G8929 Other chronic pain: Secondary | ICD-10-CM

## 2018-08-05 NOTE — Therapy (Signed)
[facility] [address] [place], [place], [address] Phone: [phone]   Fax:  [phone]  Physical Therapy Evaluation  Patient Details  Name: Cheryl Prince MRN: [id number] Date of Birth: 25-May-1955 Referring Provider (PT): Gwinda PasseMichelle Edwards, NP  CLINIC OPERATION CHANGES: Outpatient Neuro Rehab is open at lower capacity following universal masking, social distancing, and patient screening.  The patient's COVID risk of  complications score is 3.   Encounter Date: 08/04/2018  PT End of Session - 08/05/18 1920    Visit Number  1    Number of Visits  5    Date for PT Re-Evaluation  09/03/18    Authorization Type  UHC Medicare    Authorization Time Period  08-04-18 - 10-26-18    PT Start Time  1102    PT Stop Time  1153    PT Time Calculation (min)  51 min    Activity Tolerance  Patient limited by pain    Behavior During Therapy  [facility] assessed/performed       Past Medical History:  Diagnosis Date  . Bronchitis   . Hypertension     Past Surgical History:  Procedure Laterality Date  . ABDOMINAL HYSTERECTOMY    . BREAST SURGERY    . CHOLECYSTECTOMY      There were no vitals filed for this visit.   Subjective Assessment - 08/05/18 1858    Subjective  Pt reports she has been referred to PT because her MD said she needed the eval in order to get a PCA:  has OA in both knees with Lt knee worse than Rt knee; Lt knee is swollen - says she ices it at times when the swelling is really bad    Pertinent History  Lt rotator cuff tendonitis:  carpal tunnel syndrome;  Lt knee OA;  HTN    How long can you sit comfortably?  sitting does not bother her so much - extends knees when she sits    How long can you stand comfortably?  approx. 5" with use of knee sleeve on LLE - unable to stand in a long line    How long can you walk comfortably?  if she takes medication approx. 3"-5"    Diagnostic tests  X-rays done  in March    Patient Stated Goals  to assist with the stiffening in her knees    Currently in Pain?  Other (Comment)    Pain Score  --   pt reports pain varies in intensity - states it will increase to 9-10/10 without pain medication and with prolonged standing or walking   Pain Location  Knee    Pain Orientation  Left    Pain Descriptors / Indicators  Aching;Discomfort;Dull;Grimacing    Pain Type  Chronic pain    Pain Onset  More than a month ago    Pain Frequency  Intermittent    Aggravating Factors   standing or walking    Pain Relieving Factors  Tylenol and other medications help    Effect of Pain on Daily Activities  effects ability to perform ADL's in standing - such as cooking, showering, etc.    Multiple Pain Sites  Yes    Pain Score  6    Pain Location  Wrist    Pain Orientation  Right    Pain Descriptors / Indicators  Aching;Dull    Pain Type  Acute pain   since fall on 06-09-18   Pain  Onset  More than a month ago    Pain Frequency  Intermittent         [facility] PT Assessment - 08/05/18 0001      Assessment   Medical Diagnosis  OA in bil. knees    Referring Provider (PT)  Gwinda PasseMichelle Edwards, NP    Onset Date/Surgical Date  06/09/18    Hand Dominance  Left      Precautions   Precautions  Fall    Required Braces or Orthoses  Other Brace/Splint   knee sleeze worn on LLE     Restrictions   Weight Bearing Restrictions  No      Balance Screen   Has the patient fallen in the past 6 months  Yes    How many times?  1   on 06-09-18 - Lt knee gave way and she sprained her Rt wrist   Has the patient had a decrease in activity level because of a fear of falling?   No    Is the patient reluctant to leave their home because of a fear of falling?   Yes      Home Environment   Living Environment  Private residence    Living Arrangements  Alone    Type of Home  House    Home Access  Level entry    Home Layout  Two level    Alternate Level Stairs-Number of Steps  12    Home  Equipment  [place] - single point      Prior Function   Level of Independence  Needs assistance with homemaking;Independent with basic ADLs;Independent with household mobility without device;Independent with household mobility with device;Independent with community mobility with device    Vocation  Retired      ROM / Strength   AROM / PROM / Strength  Strength      Strength   Overall Strength  Deficits;Due to pain    Strength Assessment Site  Knee    Right/Left Knee  Right;Left    Right Knee Flexion  4/5    Right Knee Extension  5/5    Left Knee Flexion  --   unable to test due to c/o pain   Left Knee Extension  4-/5   c/o pain with resistance     Transfers   Transfers  Sit to Stand    Number of Reps  Other reps (comment)   1   Comments  able to stand without UE support      Ambulation/Gait   Ambulation/Gait  Yes    Ambulation/Gait Assistance  6: Modified independent (Device/Increase time)    Ambulation Distance (Feet)  230 Feet   in 1"42.75 secs - no device used   Assistive device  None    Gait Pattern  Antalgic;Step-through pattern   due to Lt knee pain   Ambulation Surface  Level;Indoor    Gait velocity  14.40 = 2.28 ft/sec    no device used    Stairs  Yes    Stairs Assistance  5: Supervision    Stair Management Technique  Forwards;Backwards;Two rails;Step to pattern   forwards up; backwards down   Number of Stairs  4    Height of Stairs  6    Ramp  5: Supervision    Curb  5: Supervision   pt turns sideways to ascend/descend curb     Standardized Balance Assessment   Standardized Balance Assessment  Timed Up and Go Test      Timed  Up and Go Test   Normal TUG (seconds)  13.78   no device               Objective measurements completed on examination: See above findings.              PT Education - 08/05/18 1919    Education Details  eval results; aquatic therapy recommended due to c/o Lt knee pain with weight bearing on land    Person(s)  Educated  Patient    Methods  Explanation    Comprehension  Verbalized understanding          PT Long Term Goals - 08/05/18 1927      PT LONG TERM GOAL #1   Title  Pt will report at least 50% pain reduction in Lt knee during 45" aquatic therapy treatment session    Time  4    Period  Weeks    Status  New    Target Date  09/02/08      PT LONG TERM GOAL #2   Title  Pt will be independent in HEP for aquatic exercises for continuation upon D/C from PT.    Time  4    Period  Weeks    Status  New    Target Date  09/03/18             Plan - 08/05/18 1921    Clinical Impression Statement  Pt presents with gait and balance deficits with antalgic gait pattern due to Lt knee OA; pt reports severe Lt knee pain with weight bearing positions; pt unable to tolerate complete manual muscle testing due to c/o pain with resistance.  Pt states she has SPC and RW at home but is not using assistive device at initial evaluation.  Pt would benefit from aquatic therapy for unloading of joint and for decreased pain with weight bearing in water; pt is interested in participating in aquatic therapy.    Personal Factors and Comorbidities  Comorbidity 1;Time since onset of injury/illness/exacerbation;Fitness    Examination-Activity Limitations  Stand;Stairs;Squat;Locomotion Level    Examination-Participation Restrictions  Meal Prep;Community Activity;Cleaning;Laundry;Shop    Stability/Clinical Decision Making  Stable/Uncomplicated    Clinical Decision Making  Low    Rehab Potential  Good    PT Frequency  1x / week    PT Duration  4 weeks    PT Treatment/Interventions  Aquatic Therapy;ADLs/Self Care Home Management;Neuromuscular re-education;Therapeutic activities;Therapeutic exercise;Balance training;Patient/family education;Gait training    PT Next Visit Plan  aquatic therapy    PT Home Exercise Plan  exercises in water for cont. aquatic ex. program upon D/C from PT    Recommended Other Services   aquatic therapy at [facility] and Agree with Plan of Care  Patient       Patient will benefit from skilled therapeutic intervention in order to improve the following deficits and impairments:  Difficulty walking, Pain, Decreased strength, Decreased balance, Decreased activity tolerance, Impaired flexibility  Visit Diagnosis: Chronic pain of left knee - Plan: PT plan of care cert/re-cert  Other abnormalities of gait and mobility - Plan: PT plan of care cert/re-cert  Muscle weakness (generalized) - Plan: PT plan of care cert/re-cert     Problem List Patient Active Problem List   Diagnosis Date Noted  . Alcohol use, unspecified with unspecified alcohol-induced disorder ([place]) 05/27/2018  . PTSD (post-traumatic stress disorder) 05/30/2016  . Alpha thalassemia trait 05/30/2016  . Hypertension 05/30/2016  . Rotator cuff tendonitis, left  05/30/2016  . Osteoarthritis of left knee 05/30/2016  . Carpal tunnel syndrome 05/30/2016  . Prediabetes 05/30/2016  . Onychomycosis 05/30/2016  . S/P tonsillectomy 05/30/2016  . Goiter, toxic diffuse 05/30/2016  . Chronic tonsillitis 05/30/2016    DildayDonavan Burnet, Haywood Meinders Suzanne, PT 08/05/2018, 7:33 PM  [facility] [address] [place], [place], [address] Phone: [phone]   Fax:  [phone]  Name: Cheryl Prince MRN: [id number] Date of Birth: [birth year]

## 2018-08-06 NOTE — Therapy (Signed)
[place] [address] [place], [place], [address] Phone: [phone]   Fax:  [phone]  Physical Therapy Treatment  Patient Details  Name: Cheryl Prince MRN: [id number] Date of Birth: 08-Jun-1955 Referring Provider (PT): Juluis Mire, NP    Encounter Date: 08/05/2018  PT End of Session - 08/06/18 2032    Visit Number  2    Number of Visits  4    Date for PT Re-Evaluation  09/03/18    Authorization Type  UHC Medicare    Authorization Time Period  08-04-18 - 10-26-18    PT Start Time  1100    PT Stop Time  1150    PT Time Calculation (min)  50 min    Activity Tolerance  Patient tolerated treatment well    Behavior During Therapy  [facility] for tasks assessed/performed       Past Medical History:  Diagnosis Date  . Bronchitis   . Hypertension     Past Surgical History:  Procedure Laterality Date  . ABDOMINAL HYSTERECTOMY    . BREAST SURGERY    . CHOLECYSTECTOMY      There were no vitals filed for this visit.  Subjective Assessment - 08/06/18 2026    Subjective  Pt presents for aquatic therapy at [facility] - states she is excited to be able to get in the pool    Pertinent History  Lt rotator cuff tendonitis:  carpal tunnel syndrome;  Lt knee OA;  HTN    How long can you sit comfortably?  sitting does not bother her so much - extends knees when she sits    How long can you stand comfortably?  approx. 5" with use of knee sleeve on LLE - unable to stand in a long line    How long can you walk comfortably?  if she takes medication approx. 3"-5"    Diagnostic tests  X-rays done in March    Patient Stated Goals  to assist with the stiffening in her knees    Currently in Pain?  Yes    Pain Score  2     Pain Location  Knee    Pain Orientation  Left    Pain Descriptors / Indicators  Aching;Dull;Grimacing    Pain Type  Chronic pain    Pain Onset  More than a month ago    Pain Frequency  Constant    Aggravating Factors   weight bearing aggravates    Pain Relieving Factors  non-weight bearing positions    Pain Onset  More than a month ago        Aquatic therapy - at [facility] - pool 87.0 degrees  Patient seen for aquatic therapy today.  Treatment took place in water 3.5-4 feet deep depending upon activity.  Pt entered and  exited the pool via step negotiation with use of 2 hand rails using a step by step sequence.  Pt performed water walking for warm up - forwards, backwards, sideways 36m across pool x 2 reps each direction; Sidestepping with small squats 68m x 2 reps Marching forwards and backwards 79m x 2 reps with pt flexing Lt knee to tolerance  Pt performed standing hip abduction, extension and flexion on each leg 10 reps each direction; marching in place 10 reps each Heel raises bil. LE's 10 reps Partial squats (small ROM due to Lt knee limitations) x 10 reps with pt holding at pool edge prn  Pt performed water walking again  at end of session with UE reciprocal arm swing in water for increased resistance  Pt requires buoyancy of water for support and for the ability to off load the body while in water;  Weight of LLE is reduced which allows More freedom of movement to facilitate increased AROM of Lt knee; pt requires viscosity of water for resistance for strengthening                            PT Long Term Goals - 08/05/18 1927      PT LONG TERM GOAL #1   Title  Pt will report at least 50% pain reduction in Lt knee during 45" aquatic therapy treatment session    Time  4    Period  Weeks    Status  New    Target Date  09/02/08      PT LONG TERM GOAL #2   Title  Pt will be independent in HEP for aquatic exercises for continuation upon D/C from PT.    Time  4    Period  Weeks    Status  New    Target Date  09/03/18            Plan - 08/06/18 2034    Clinical Impression Statement  Pt tolerated aquatic therapy session  well with minimal c/o left knee pain with exercises in the water;  pt had limited left knee active flexion but stated she felt left knee loosened up some with active ROM exs. and with water walking.  Pt requires buoyancy of water for support and for off loading joint in weight bearing & for allowing increased freedom of movement with less pain in water than on land.    Rehab Potential  Good    PT Frequency  1x / week   2x/week for first week then 1x/week for 4 weeks for aquatic therapy   PT Duration  4 weeks    PT Treatment/Interventions  ADLs/Self Care Home Management;Aquatic Therapy;Balance training;Therapeutic exercise;Therapeutic activities;Gait training;Neuromuscular re-education;Patient/family education    PT Next Visit Plan  aquatic therapy - pt wishes to cancel land appts due to benefits of aquatic therapy and also due to concerns with co-pay    Consulted and Agree with Plan of Care  Patient       Patient will benefit from skilled therapeutic intervention in order to improve the following deficits and impairments:     Visit Diagnosis: Chronic pain of left knee  Other abnormalities of gait and mobility  Muscle weakness (generalized)     Problem List Patient Active Problem List   Diagnosis Date Noted  . Alcohol use, unspecified with unspecified alcohol-induced disorder ([facility]) 05/27/2018  . PTSD (post-traumatic stress disorder) 05/30/2016  . Alpha thalassemia trait 05/30/2016  . Hypertension 05/30/2016  . Rotator cuff tendonitis, left 05/30/2016  . Osteoarthritis of left knee 05/30/2016  . Carpal tunnel syndrome 05/30/2016  . Prediabetes 05/30/2016  . Onychomycosis 05/30/2016  . S/P tonsillectomy 05/30/2016  . Goiter, toxic diffuse 05/30/2016  . Chronic tonsillitis 05/30/2016    Kary KosDilday, Travontae Freiberger Suzanne, PT 08/06/2018, 8:43 PM  [facility] [address] [place], [place], [address] Phone: [phone]   Fax:   [phone]  Name: Cheryl Prince MRN: [id number] Date of Birth: [birth year]

## 2018-08-09 ENCOUNTER — Other Ambulatory Visit (HOSPITAL_COMMUNITY): Payer: Self-pay | Admitting: Psychiatry

## 2018-08-09 DIAGNOSIS — F4310 Post-traumatic stress disorder, unspecified: Secondary | ICD-10-CM

## 2018-08-09 DIAGNOSIS — F319 Bipolar disorder, unspecified: Secondary | ICD-10-CM

## 2018-08-17 ENCOUNTER — Ambulatory Visit: Payer: Medicare Other | Admitting: Physical Therapy

## 2018-08-17 ENCOUNTER — Other Ambulatory Visit: Payer: Self-pay

## 2018-08-17 DIAGNOSIS — M25562 Pain in left knee: Secondary | ICD-10-CM | POA: Diagnosis not present

## 2018-08-17 DIAGNOSIS — R2689 Other abnormalities of gait and mobility: Secondary | ICD-10-CM

## 2018-08-17 DIAGNOSIS — M6281 Muscle weakness (generalized): Secondary | ICD-10-CM

## 2018-08-18 NOTE — Therapy (Signed)
[facility] [address] [place], [place], [address] Phone: [phone]   Fax:  [phone]  Physical Therapy Treatment  Patient Details  Name: Cheryl Prince MRN: [id number] Date of Birth: 12/29/1955 Referring Provider (PT): Gwinda PasseMichelle Edwards, NP   Encounter Date: 08/17/2018  PT End of Session - 08/18/18 1932    Visit Number  3    Number of Visits  4    Date for PT Re-Evaluation  09/03/18    Authorization Type  UHC Medicare    Authorization Time Period  08-04-18 - 10-26-18    PT Start Time  1200    PT Stop Time  1250    PT Time Calculation (min)  50 min    Activity Tolerance  Patient tolerated treatment well    Behavior During Therapy  [facility] for tasks assessed/performed       Past Medical History:  Diagnosis Date  . Bronchitis   . Hypertension     Past Surgical History:  Procedure Laterality Date  . ABDOMINAL HYSTERECTOMY    . BREAST SURGERY    . CHOLECYSTECTOMY      There were no vitals filed for this visit.  Subjective Assessment - 08/18/18 1931    Subjective  [facility] - states her left knee is hurting more today - that is why she is using cane today    Pertinent History  Lt rotator cuff tendonitis:  carpal tunnel syndrome;  Lt knee OA;  HTN    How long can you sit comfortably?  sitting does not bother her so much - extends knees when she sits    How long can you stand comfortably?  approx. 5" with use of knee sleeve on LLE - unable to stand in a long line    How long can you walk comfortably?  if she takes medication approx. 3"-5"    Diagnostic tests  X-rays done in March    Patient Stated Goals  to assist with the stiffening in her knees    Currently in Pain?  Yes    Pain Score  4     Pain Location  Knee    Pain Orientation  Left    Pain Descriptors / Indicators  Aching;Dull    Pain Type  Chronic pain    Pain Onset  More than a month ago    Pain Frequency   Intermittent    Pain Onset  More than a month ago      [facility] temp. 87.2 degrees  Patient seen for aquatic therapy today.  Treatment took place in water 3.5-4 feet deep depending upon activity.  Pt entered  And exited the pool via step negotiation with use of hand rails modified independently.   Pt performed water walking for warm up - forwards, backwards, sideways 815m across pool x 2 reps each direction; Sidestepping with small squats 75m x 2 reps Marching forwards and backwards 245m x 2 reps with pt flexing Lt knee to tolerance  Pt performed standing hip abduction, extension and flexion on each leg 10 reps each direction with use of buoyant ankle cuff weights  Marching in place 10 reps each: Heel raises bil. LE's 10 reps Partial squats (small ROM due to Lt knee limitations) x 10 reps with pt holding at pool edge prn  Braiding 7650m across length of pool with cues for sequence  Pt performed water walking again at end of session  with UE reciprocal arm swing in water for increased resistance  Pt requires buoyancy of water for support and for the ability to off load the body while in water;  Weight of LLE is reduced which allows More freedom of movement to facilitate increased AROM of Lt knee; pt requires viscosity of water for resistance for strengthening                              PT Long Term Goals - 08/18/18 1935      PT LONG TERM GOAL #1   Title  Pt will report at least 50% pain reduction in Lt knee during 45" aquatic therapy treatment session    Time  4    Period  Weeks    Status  New      PT LONG TERM GOAL #2   Title  Pt will be independent in HEP for aquatic exercises for continuation upon D/C from PT.    Time  4    Period  Weeks    Status  New            Plan - 08/18/18 1932    Clinical Impression Statement  Pt reported increased Lt knee pain during aquatic exercise today compared to that in previous session,  however, reported increased pain at start of session with pt using SPC for assistance with ambulation; pt used buoyant ankle cuffs for first time today, providing resistance with eccentric contractions of hip flexors, extensors and abductors    Rehab Potential  Good    PT Frequency  1x / week   2x/week for first week then 1x/week for 4 weeks for aquatic therapy   PT Duration  4 weeks    PT Treatment/Interventions  ADLs/Self Care Home Management;Aquatic Therapy;Balance training;Therapeutic exercise;Therapeutic activities;Gait training;Neuromuscular re-education;Patient/family education    PT Next Visit Plan  aquatic therapy - pt wishes to cancel land appts due to benefits of aquatic therapy and also due to concerns with co-pay    Consulted and Agree with Plan of Care  Patient       Patient will benefit from skilled therapeutic intervention in order to improve the following deficits and impairments:  Difficulty walking, Pain, Decreased strength, Decreased balance, Decreased activity tolerance, Impaired flexibility  Visit Diagnosis: 1. Other abnormalities of gait and mobility   2. Muscle weakness (generalized)        Problem List Patient Active Problem List   Diagnosis Date Noted  . Alcohol use, unspecified with unspecified alcohol-induced disorder ([place]) 05/27/2018  . PTSD (post-traumatic stress disorder) 05/30/2016  . Alpha thalassemia trait 05/30/2016  . Hypertension 05/30/2016  . Rotator cuff tendonitis, left 05/30/2016  . Osteoarthritis of left knee 05/30/2016  . Carpal tunnel syndrome 05/30/2016  . Prediabetes 05/30/2016  . Onychomycosis 05/30/2016  . S/P tonsillectomy 05/30/2016  . Goiter, toxic diffuse 05/30/2016  . Chronic tonsillitis 05/30/2016    Alda Lea, PT 08/18/2018, 7:37 PM  [place] [address] [place], [place], [address] Phone: [phone]   Fax:  [phone]  Name: Cheryl Prince MRN: [id number] Date of Birth: 1955/09/14

## 2018-08-24 ENCOUNTER — Other Ambulatory Visit: Payer: Self-pay | Admitting: Pharmacist

## 2018-08-24 MED ORDER — AMLODIPINE-VALSARTAN-HCTZ 10-160-12.5 MG PO TABS: ORAL_TABLET | ORAL | 0 refills | Status: DC

## 2018-08-31 ENCOUNTER — Ambulatory Visit: Payer: Medicare Other | Attending: Primary Care | Admitting: Physical Therapy

## 2018-08-31 ENCOUNTER — Other Ambulatory Visit: Payer: Self-pay

## 2018-08-31 DIAGNOSIS — M6281 Muscle weakness (generalized): Secondary | ICD-10-CM | POA: Insufficient documentation

## 2018-08-31 DIAGNOSIS — R2689 Other abnormalities of gait and mobility: Secondary | ICD-10-CM | POA: Diagnosis not present

## 2018-09-01 NOTE — Therapy (Signed)
[facility] [address] [place], [place], [address] Phone: [phone]   Fax:  [phone]  Physical Therapy Treatment  Patient Details  Name: Cheryl Prince MRN: [id number] Date of Birth: 23-Dec-1955 Referring Provider (PT): Gwinda PasseMichelle Edwards, NP   Encounter Date: 08/31/2018  PT End of Session - 09/01/18 2205    Visit Number  4    Number of Visits  4    Date for PT Re-Evaluation  09/03/18    Authorization Type  [employer] Medicare    Authorization Time Period  08-04-18 - 10-26-18    PT Start Time  1200    PT Stop Time  1250    PT Time Calculation (min)  50 min    Activity Tolerance  Patient tolerated treatment well    Behavior During Therapy  [facility] for tasks assessed/performed       Past Medical History:  Diagnosis Date  . Bronchitis   . Hypertension     Past Surgical History:  Procedure Laterality Date  . ABDOMINAL HYSTERECTOMY    . BREAST SURGERY    . CHOLECYSTECTOMY      There were no vitals filed for this visit.  Subjective Assessment - 09/01/18 2204    Subjective  [facility] - states her left knee is hurting some today    Pertinent History  Lt rotator cuff tendonitis:  carpal tunnel syndrome;  Lt knee OA;  HTN    How long can you sit comfortably?  sitting does not bother her so much - extends knees when she sits    How long can you stand comfortably?  approx. 5" with use of knee sleeve on LLE - unable to stand in a long line    How long can you walk comfortably?  if she takes medication approx. 3"-5"    Diagnostic tests  X-rays done in March    Patient Stated Goals  to assist with the stiffening in her knees    Currently in Pain?  Yes    Pain Score  3     Pain Location  Knee    Pain Orientation  Left    Pain Descriptors / Indicators  Aching;Dull    Pain Type  Chronic pain    Pain Onset  More than a month ago    Pain Onset  More than a month ago                                     PT Long Term Goals - 09/01/18 2206      PT LONG TERM GOAL #1   Title  Pt will report at least 50% pain reduction in Lt knee during 45" aquatic therapy treatment session    Time  4    Period  Weeks    Status  New      PT LONG TERM GOAL #2   Title  Pt will be independent in HEP for aquatic exercises for continuation upon D/C from PT.    Time  4    Period  Weeks    Status  New            Plan - 09/01/18 2206    Rehab Potential  Good    PT Frequency  1x / week   2x/week for first week then 1x/week for 4 weeks for aquatic therapy   PT Duration  4 weeks    PT Treatment/Interventions  ADLs/Self Care Home Management;Aquatic Therapy;Balance training;Therapeutic exercise;Therapeutic activities;Gait training;Neuromuscular re-education;Patient/family education    PT Next Visit Plan  aquatic therapy - pt wishes to cancel land appts due to benefits of aquatic therapy and also due to concerns with co-pay    Consulted and Agree with Plan of Care  Patient       Patient will benefit from skilled therapeutic intervention in order to improve the following deficits and impairments:  Difficulty walking, Pain, Decreased strength, Decreased balance, Decreased activity tolerance, Impaired flexibility  Visit Diagnosis: 1. Other abnormalities of gait and mobility   2. Muscle weakness (generalized)        Problem List Patient Active Problem List   Diagnosis Date Noted  . Alcohol use, unspecified with unspecified alcohol-induced disorder ([place]) 05/27/2018  . PTSD (post-traumatic stress disorder) 05/30/2016  . Alpha thalassemia trait 05/30/2016  . Hypertension 05/30/2016  . Rotator cuff tendonitis, left 05/30/2016  . Osteoarthritis of left knee 05/30/2016  . Carpal tunnel syndrome 05/30/2016  . Prediabetes 05/30/2016  . Onychomycosis 05/30/2016  . S/P tonsillectomy 05/30/2016  . Goiter, toxic diffuse 05/30/2016  . Chronic  tonsillitis 05/30/2016    Alda Lea, PT 09/01/2018, 10:07 PM  [facility] [address] [place], [place], [address] Phone: [phone]   Fax:  [phone]  Name: Cheryl Prince MRN: [id number] Date of Birth: [birth year]

## 2018-09-02 ENCOUNTER — Other Ambulatory Visit: Payer: Self-pay

## 2018-09-02 ENCOUNTER — Encounter: Payer: Self-pay | Admitting: Family Medicine

## 2018-09-02 ENCOUNTER — Ambulatory Visit: Payer: Medicare Other | Attending: Family Medicine | Admitting: Family Medicine

## 2018-09-02 VITALS — BP 132/86 | HR 80 | Temp 98.5°F | Ht 64.0 in | Wt 222.0 lb

## 2018-09-02 DIAGNOSIS — D509 Iron deficiency anemia, unspecified: Secondary | ICD-10-CM | POA: Diagnosis not present

## 2018-09-02 DIAGNOSIS — Z9884 Bariatric surgery status: Secondary | ICD-10-CM

## 2018-09-02 MED ORDER — FERROUS SULFATE 325 (65 FE) MG PO TABS: 325.0000 mg | ORAL_TABLET | Freq: Every day | ORAL | 1 refills | Status: DC

## 2018-09-02 NOTE — Patient Instructions (Signed)
Anemia  Anemia is a condition in which you do not have enough red blood cells or hemoglobin. Hemoglobin is a substance in red blood cells that carries oxygen. When you do not have enough red blood cells or hemoglobin (are anemic), your body cannot get enough oxygen and your organs may not work properly. As a result, you may feel very tired or have other problems. What are the causes? Common causes of anemia include:  Excessive bleeding. Anemia can be caused by excessive bleeding inside or outside the body, including bleeding from the intestine or from periods in women.  Poor nutrition.  Long-lasting (chronic) kidney, thyroid, and liver disease.  Bone marrow disorders.  Cancer and treatments for cancer.  HIV (human immunodeficiency virus) and AIDS (acquired immunodeficiency syndrome).  Treatments for HIV and AIDS.  Spleen problems.  Blood disorders.  Infections, medicines, and autoimmune disorders that destroy red blood cells. What are the signs or symptoms? Symptoms of this condition include:  Minor weakness.  Dizziness.  Headache.  Feeling heartbeats that are irregular or faster than normal (palpitations).  Shortness of breath, especially with exercise.  Paleness.  Cold sensitivity.  Indigestion.  Nausea.  Difficulty sleeping.  Difficulty concentrating. Symptoms may occur suddenly or develop slowly. If your anemia is mild, you may not have symptoms. How is this diagnosed? This condition is diagnosed based on:  Blood tests.  Your medical history.  A physical exam.  Bone marrow biopsy. Your health care provider may also check your stool (feces) for blood and may do additional testing to look for the cause of your bleeding. You may also have other tests, including:  Imaging tests, such as a CT scan or MRI.  Endoscopy.  Colonoscopy. How is this treated? Treatment for this condition depends on the cause. If you continue to lose a lot of blood, you may  need to be treated at a hospital. Treatment may include:  Taking supplements of iron, vitamin S31, or folic acid.  Taking a hormone medicine (erythropoietin) that can help to stimulate red blood cell growth.  Having a blood transfusion. This may be needed if you lose a lot of blood.  Making changes to your diet.  Having surgery to remove your spleen. Follow these instructions at home:  Take over-the-counter and prescription medicines only as told by your health care provider.  Take supplements only as told by your health care provider.  Follow any diet instructions that you were given.  Keep all follow-up visits as told by your health care provider. This is important. Contact a health care provider if:  You develop new bleeding anywhere in the body. Get help right away if:  You are very weak.  You are short of breath.  You have pain in your abdomen or chest.  You are dizzy or feel faint.  You have trouble concentrating.  You have bloody or black, tarry stools.  You vomit repeatedly or you vomit up blood. Summary  Anemia is a condition in which you do not have enough red blood cells or enough of a substance in your red blood cells that carries oxygen (hemoglobin).  Symptoms may occur suddenly or develop slowly.  If your anemia is mild, you may not have symptoms.  This condition is diagnosed with blood tests as well as a medical history and physical exam. Other tests may be needed.  Treatment for this condition depends on the cause of the anemia. This information is not intended to replace advice given to you by  your health care provider. Make sure you discuss any questions you have with your health care provider. Document Released: 03/21/2004 Document Revised: 01/24/2017 Document Reviewed: 03/15/2016 Elsevier Patient Education  2020 [employer].

## 2018-09-02 NOTE — Progress Notes (Signed)
Established Patient Office Visit  Subjective:  Patient ID: Cheryl Prince, female    DOB: April 28, 1955  Age: 63 y.o. MRN: [id number]  CC:  Chief Complaint  Patient presents with  . New Patient (Initial Visit)    HPI Cheryl Prince presents to establish care. She reports that she is currently attending physical therapy due to arthritis in her left knee but she continues to have significant knee pain. [facility] is an [facility] and has seen Gwinda PasseMichelle Edwards, NP on 07/06/2018 in follow-up of her chronic medical issues and she states that she is also had recent blood work done.  She states that overall she feels well other than her chronic knee pain.  She also has issues with chronic low back pain.  She reports that she has recently been using a cane to help with ambulation but forgot to bring her cane at today's visit.  She feels that she has gained weight since she has been less mobile due to her knee pain.  She reports a history of Roux-en-Y gastric bypass surgery.  She is not currently on any nutritional supplements as she states that she was told by a provider in the past that these were not necessary.  She has had no abdominal pain, no blood in the stool and no black stools.. She denies any issues with chest pain or palpitations, no headaches or dizziness and no fever or chills.  Past Medical History:  Diagnosis Date  . Bronchitis   . Hypertension     Past Surgical History:  Procedure Laterality Date  . ABDOMINAL HYSTERECTOMY    . BREAST SURGERY    . CHOLECYSTECTOMY      Family History  Problem Relation Age of Onset  . Hypertension Father   . Diabetes Father   . Lupus Daughter     Social History   Tobacco Use  . Smoking status: Former Games developermoker  . Smokeless tobacco: Never Used  . Tobacco comment: quit 1986  Substance Use Topics  . Alcohol use: Yes  . Drug use: No    Outpatient Medications Prior to Visit  Medication Sig Dispense Refill  .  albuterol (PROAIR HFA) 108 (90 Base) MCG/ACT inhaler Inhale 2 puffs into the lungs every 6 (six) hours as needed for wheezing or shortness of breath. 8.5 g 2  . amLODIPine-Valsartan-HCTZ 10-160-12.5 MG TABS Take 1 tablet by mouth daily. 90 tablet 0  . cetirizine (ZYRTEC ALLERGY) 10 MG tablet Take 1 tablet (10 mg total) by mouth daily. 90 tablet 1  . cyclobenzaprine (FLEXERIL) 10 MG tablet Take 1 tablet (10 mg total) by mouth 3 (three) times daily as needed for muscle spasms. 30 tablet 1  . famotidine (PEPCID) 20 MG tablet Take 1 tablet (20 mg total) by mouth 2 (two) times daily. 60 tablet 3  . FLOVENT HFA 110 MCG/ACT inhaler Inhale 2 puffs into the lungs 2 (two) times daily as needed for up to 30 days. 1 Inhaler 3  . gabapentin (NEURONTIN) 300 MG capsule Take 1 capsule (300 mg total) by mouth 3 (three) times daily. 90 capsule 3  . lamoTRIgine (LAMICTAL) 25 MG tablet TAKE 2 TABLETS BY MOUTH  DAILY 180 tablet 0  . NON FORMULARY     . ferrous sulfate 325 (65 FE) MG tablet Take 1 tablet (325 mg total) by mouth daily with breakfast for 30 days. (Patient not taking: Reported on 09/02/2018) 30 tablet 3  . prochlorperazine (COMPAZINE) 5 MG tablet Take 1 tablet (  5 mg total) by mouth every 6 (six) hours as needed for nausea or vomiting. (Patient not taking: Reported on 09/02/2018) 30 tablet 0   No facility-administered medications prior to visit.     Allergies  Allergen Reactions  . Morphine And Related Swelling  . Penicillins Swelling  . Pine Shortness Of Breath  . Grapeseed Extract [Nutritional Supplements]   . Sulfites Rash    ROS Review of Systems  Constitutional: Positive for fatigue (occasional). Negative for chills and fever.  HENT: Negative for sore throat and trouble swallowing.   Respiratory: Negative for cough and shortness of breath.   Cardiovascular: Negative for chest pain and palpitations.  Gastrointestinal: Negative for abdominal pain, blood in stool, constipation, diarrhea and  nausea.  Endocrine: Negative for cold intolerance, heat intolerance, polydipsia, polyphagia and polyuria.  Genitourinary: Negative for dysuria and frequency.  Musculoskeletal: Positive for arthralgias, back pain, gait problem and joint swelling.  Neurological: Negative for dizziness and headaches.  Hematological: Negative for adenopathy. Does not bruise/bleed easily.      Objective:    Physical Exam  Constitutional: She is oriented to person, place, and time. She appears well-developed and well-nourished.  Well-nourished well-developed obese older female in no acute distress.  Patient does have difficulty getting up from a seated position as well as difficulty getting onto the exam table  Neck: Normal range of motion. Neck supple. No JVD present.  Cardiovascular: Normal rate and regular rhythm.  Pulmonary/Chest: Effort normal and breath sounds normal.  Abdominal: Soft. There is no abdominal tenderness. There is no rebound and no guarding.  Musculoskeletal:        General: Tenderness and edema present.     Comments: Patient with some mild edema of the left knee as well as bilateral joint line tenderness of the left knee and left knee decreased range of motion due to discomfort.  Patient also with crepitation of the right knee and mild right medial joint line tenderness  Lymphadenopathy:    She has no cervical adenopathy.  Neurological: She is alert and oriented to person, place, and time.  Skin: Skin is warm and dry.  Psychiatric: She has a normal mood and affect. Her behavior is normal.  Nursing note and vitals reviewed.   BP 132/86 (BP Location: Left Arm, Patient Position: Sitting, Cuff Size: Large)   Pulse 80   Temp 98.5 F (36.9 C) (Oral)   Ht 5\' 4"  (1.626 m)   Wt 222 lb (100.7 kg)   SpO2 97%   BMI 38.11 kg/m  Wt Readings from Last 3 Encounters:  09/02/18 222 lb (100.7 kg)  07/06/18 228 lb 6.4 oz (103.6 kg)  05/27/18 223 lb (101.2 kg)     Health Maintenance Due  Topic  Date Due  . DEXA SCAN  07/16/2018  . PNA vac Low Risk Adult (1 of 2 - PCV13) 07/16/2018     Lab Results  Component Value Date   TSH 1.210 12/11/2016   Lab Results  Component Value Date   WBC 4.6 06/30/2018   HGB 10.7 (L) 06/30/2018   HCT 33.4 (L) 06/30/2018   MCV 75 (L) 06/30/2018   PLT 232 06/30/2018   Lab Results  Component Value Date   NA 137 06/30/2018   K 3.7 06/30/2018   CO2 22 06/30/2018   GLUCOSE 90 06/30/2018   BUN 17 06/30/2018   CREATININE 0.78 06/30/2018   BILITOT 0.4 06/30/2018   ALKPHOS 143 (H) 06/30/2018   AST 25 06/30/2018   ALT 18  06/30/2018   PROT 7.2 06/30/2018   ALBUMIN 4.0 06/30/2018   CALCIUM 8.9 06/30/2018   Lab Results  Component Value Date   CHOL 146 06/30/2018   Lab Results  Component Value Date   HDL 67 06/30/2018   Lab Results  Component Value Date   LDLCALC 65 06/30/2018   Lab Results  Component Value Date   TRIG 70 06/30/2018   Lab Results  Component Value Date   CHOLHDL 2.2 06/30/2018   No results found for: HGBA1C    Assessment & Plan:  1. Microcytic anemia Blood work from 06/30/2018 was discussed with the patient.  Patient with CBC showing hemoglobin of 10.7 with MCV of 75.  I discussed with the patient that since she is postmenopausal and in fact had partial hysterectomy at the age of 63 that she should have further evaluation of her anemia.  Patient reluctantly agrees to referral to gastroenterology for further evaluation and CBC will be rechecked at today's visit.  Patient also states that she never received ferrous sulfate that she was told had been ordered and she would like to have this medication sent into pharmacy.  She believes that she may be due for colonoscopy as well. - Ambulatory referral to Gastroenterology - ferrous sulfate 325 (65 FE) MG tablet; Take 1 tablet (325 mg total) by mouth daily with breakfast.  Dispense: 90 tablet; Refill: 1 - CBC with Differential  2. History of weight loss surgery Patient  reports a history of Roux-N-Y gastric bypass surgery and I discussed with patient that this could place her at an increased risk of nutritional deficiencies as well as increased risk of GI blood loss.  Patient is being referred to gastroenterology for further evaluation.  And has also suggested that she take a daily multivitamin however she states that she was told by her prior PCP that this was not necessary. - Ambulatory referral to Gastroenterology  Allergies as of 09/02/2018      Reactions   Morphine And Related Swelling   Penicillins Swelling   Pine Shortness Of Breath   Grapeseed Extract [nutritional Supplements]    Sulfites Rash      Medication List       Accurate as of September 02, 2018 11:59 PM. If you have any questions, ask your nurse or doctor.        albuterol 108 (90 Base) MCG/ACT inhaler Commonly known as: ProAir HFA Inhale 2 puffs into the lungs every 6 (six) hours as needed for wheezing or shortness of breath.   amLODIPine-Valsartan-HCTZ 10-160-12.5 MG Tabs Take 1 tablet by mouth daily.   cetirizine 10 MG tablet Commonly known as: ZyrTEC Allergy Take 1 tablet (10 mg total) by mouth daily.   cyclobenzaprine 10 MG tablet Commonly known as: FLEXERIL Take 1 tablet (10 mg total) by mouth 3 (three) times daily as needed for muscle spasms.   famotidine 20 MG tablet Commonly known as: Pepcid Take 1 tablet (20 mg total) by mouth 2 (two) times daily.   ferrous sulfate 325 (65 FE) MG tablet Take 1 tablet (325 mg total) by mouth daily with breakfast.   Flovent HFA 110 MCG/ACT inhaler Generic drug: fluticasone Inhale 2 puffs into the lungs 2 (two) times daily as needed for up to 30 days.   gabapentin 300 MG capsule Commonly known as: NEURONTIN Take 1 capsule (300 mg total) by mouth 3 (three) times daily.   lamoTRIgine 25 MG tablet Commonly known as: LAMICTAL TAKE 2 TABLETS BY MOUTH  DAILY  NON FORMULARY   prochlorperazine 5 MG tablet Commonly known as:  COMPAZINE Take 1 tablet (5 mg total) by mouth every 6 (six) hours as needed for nausea or vomiting.       Follow-up: Return in about 3 months (around 12/03/2018) for chronic issues.   Antony Blackbird, MD

## 2018-09-02 NOTE — Progress Notes (Signed)
New patient est care Med refills

## 2018-09-03 ENCOUNTER — Encounter: Payer: Self-pay | Admitting: Orthopedic Surgery

## 2018-09-03 ENCOUNTER — Ambulatory Visit (INDEPENDENT_AMBULATORY_CARE_PROVIDER_SITE_OTHER): Payer: Medicare Other | Admitting: Orthopedic Surgery

## 2018-09-03 ENCOUNTER — Ambulatory Visit: Payer: Self-pay

## 2018-09-03 DIAGNOSIS — G8929 Other chronic pain: Secondary | ICD-10-CM | POA: Diagnosis not present

## 2018-09-03 DIAGNOSIS — M25562 Pain in left knee: Secondary | ICD-10-CM | POA: Diagnosis not present

## 2018-09-03 LAB — CBC WITH DIFFERENTIAL/PLATELET
Basophils Absolute: 0.1 x10E3/uL (ref 0.0–0.2)
Basos: 2 %
EOS (ABSOLUTE): 0.1 x10E3/uL (ref 0.0–0.4)
Eos: 1 %
Hematocrit: 36.2 % (ref 34.0–46.6)
Hemoglobin: 11.3 g/dL (ref 11.1–15.9)
Immature Grans (Abs): 0 x10E3/uL (ref 0.0–0.1)
Immature Granulocytes: 0 %
Lymphocytes Absolute: 1.7 x10E3/uL (ref 0.7–3.1)
Lymphs: 38 %
MCH: 23.7 pg — ABNORMAL LOW (ref 26.6–33.0)
MCHC: 31.2 g/dL — ABNORMAL LOW (ref 31.5–35.7)
MCV: 76 fL — ABNORMAL LOW (ref 79–97)
Monocytes Absolute: 0.5 x10E3/uL (ref 0.1–0.9)
Monocytes: 11 %
Neutrophils Absolute: 2.2 x10E3/uL (ref 1.4–7.0)
Neutrophils: 48 %
Platelets: 297 x10E3/uL (ref 150–450)
RBC: 4.77 x10E6/uL (ref 3.77–5.28)
RDW: 15.6 % — ABNORMAL HIGH (ref 11.7–15.4)
WBC: 4.6 x10E3/uL (ref 3.4–10.8)

## 2018-09-03 NOTE — Progress Notes (Signed)
Office Visit Note   Patient: Cheryl Prince           Date of Birth: Nov 16, 1955           MRN: [id number] Visit Date: 09/03/2018 Requested by: Kerin Perna, NP [address] [place],  [place] [address] PCP: Antony Blackbird, MD  Subjective: Chief Complaint  Patient presents with  . Left Knee - Pain    HPI: Pt is a 63 y.o. Female who presents to the clinic complaining of L knee pain.  Pt reports long-standing history of L knee pain.  She states that she wants to discuss surgery now that her granddaughter has moved out of her home.  She reports medial and lateral L knee pain with catching sensations and feelings of instability.  She has been using Ibuprofen, ice, and Gabapentin to treat her pain, with little relief.  She denies any history of DM and she has not smoked since 1986.  She notes her last cortisone injection was 4-5 years ago.    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  Her home has ~15 stairs in it to her bedroom and she requests a stay at a SNF following TKA.                ROS: All systems reviewed are negative as they relate to the chief complaint within the history of present illness.  Patient denies  fevers or chills.   Assessment & Plan: Visit Diagnoses:  1. Chronic pain of left knee     Plan: Pt has very severe arthritis in her L knee with a varus deformity.  Plan for L TKA, as requested by patient.  Discussed risks and benefits of the procedure, going into detail about the risk of infection and the degree of physical therapy required afterwards. Pt understands and wishes to proceed. She will stay at a SNF following her procedure and a brief stay in the hospital.   Follow-Up Instructions: No follow-ups on file.   Orders:  Orders Placed This Encounter  Procedures  . XR Knee 1-2 Views Left   No orders of the defined types were placed in this encounter.     Procedures: No procedures performed    Clinical Data: No additional findings.  Objective: Vital Signs: There were no vitals taken for this visit.  Physical Exam:   Constitutional: Patient appears well-developed HEENT:  Head: Normocephalic Eyes:EOM are normal Neck: Normal range of motion Cardiovascular: Normal rate Pulmonary/chest: Effort normal Neurologic: Patient is alert Skin: Skin is warm Psychiatric: Patient has normal mood and affect    Ortho Exam:  L knee exam Trace effusion Extensor mechanism intact TTP over the medial and lateral joint lines No TTP over the quad tendon, patellar tendon, pes anserinus, patella, tibial tubercle, LCL/MCL insertions Stable to varus/valgus stresses.  Stable to anterior/posterior drawer Flexion contracture of ~10 degrees   Specialty Comments:  No specialty comments available.  Imaging: No results found.   PMFS History: Patient Active Problem List   Diagnosis Date Noted  . Alcohol use, unspecified with unspecified alcohol-induced disorder ([place]) 05/27/2018  . PTSD (post-traumatic stress disorder) 05/30/2016  . Alpha thalassemia trait 05/30/2016  . Hypertension 05/30/2016  . Rotator cuff tendonitis, left 05/30/2016  . Osteoarthritis of left knee 05/30/2016  . Carpal tunnel syndrome 05/30/2016  . Prediabetes 05/30/2016  . Onychomycosis 05/30/2016  . S/P tonsillectomy 05/30/2016  . Goiter, toxic diffuse 05/30/2016  . Chronic tonsillitis  05/30/2016   Past Medical History:  Diagnosis Date  . Bronchitis   . Hypertension     Family History  Problem Relation Age of Onset  . Hypertension Father   . Diabetes Father   . Lupus Daughter     Past Surgical History:  Procedure Laterality Date  . ABDOMINAL HYSTERECTOMY    . BREAST SURGERY    . CHOLECYSTECTOMY     Social History   Occupational History  . Not on file  Tobacco Use  . Smoking status: Former Games developermoker  . Smokeless tobacco: Never Used  . Tobacco comment: quit 1986  Substance and Sexual Activity  .  Alcohol use: Yes  . Drug use: No  . Sexual activity: Not on file

## 2018-09-04 ENCOUNTER — Other Ambulatory Visit: Payer: Self-pay

## 2018-09-06 ENCOUNTER — Encounter: Payer: Self-pay | Admitting: Family Medicine

## 2018-09-08 ENCOUNTER — Ambulatory Visit: Payer: Medicare Other | Admitting: Physical Therapy

## 2018-09-09 ENCOUNTER — Ambulatory Visit: Payer: Medicare Other | Admitting: Physical Therapy

## 2018-09-09 ENCOUNTER — Other Ambulatory Visit: Payer: Self-pay

## 2018-09-09 DIAGNOSIS — M6281 Muscle weakness (generalized): Secondary | ICD-10-CM

## 2018-09-09 DIAGNOSIS — R2689 Other abnormalities of gait and mobility: Secondary | ICD-10-CM | POA: Diagnosis not present

## 2018-09-10 ENCOUNTER — Encounter: Payer: Self-pay | Admitting: Physical Therapy

## 2018-09-10 NOTE — Therapy (Signed)
[facility] [address] [place], [place], [address] Phone: [phone]   Fax:  [phone]  Physical Therapy Treatment  Patient Details  Name: Cheryl Prince MRN: [id number] Date of Birth: 1955/12/03 Referring Provider (PT): Juluis Mire, NP   Encounter Date: 09/09/2018  PT End of Session - 09/10/18 2057    Visit Number  5    Number of Visits  5    Date for PT Re-Evaluation  09/03/18    Authorization Type  [employer] Medicare    Authorization Time Period  08-04-18 - 10-26-18    PT Start Time  1300    PT Stop Time  1350    PT Time Calculation (min)  50 min    Activity Tolerance  Patient tolerated treatment well    Behavior During Therapy  [facility] for tasks assessed/performed       Past Medical History:  Diagnosis Date  . Bronchitis   . Hypertension     Past Surgical History:  Procedure Laterality Date  . ABDOMINAL HYSTERECTOMY    . BREAST SURGERY    . CHOLECYSTECTOMY      There were no vitals filed for this visit.  Subjective Assessment - 09/10/18 2055    Subjective  Pt presents for aquatic therapy at [facility] - states she is having surgery on 09-22-18 (TKA)    Pertinent History  Lt rotator cuff tendonitis:  carpal tunnel syndrome;  Lt knee OA;  HTN    How long can you sit comfortably?  sitting does not bother her so much - extends knees when she sits    How long can you stand comfortably?  approx. 5" with use of knee sleeve on LLE - unable to stand in a long line    How long can you walk comfortably?  if she takes medication approx. 3"-5"    Diagnostic tests  X-rays done in March    Patient Stated Goals  to assist with the stiffening in her knees    Currently in Pain?  Yes    Pain Score  3     Pain Location  Knee    Pain Orientation  Left    Pain Descriptors / Indicators  Aching    Pain Type  Chronic pain    Pain Onset  More than a month ago    Pain Frequency  Constant    Pain Onset  More than a month  ago        Aquatic therapy at [facility] - pool temp. 87.2 degrees  Patient seen for aquatic therapy today.  Treatment took place in water 3.5-4 feet deep depending upon activity.  Pt entered  And exited the pool via step negotiation with use of hand rails modified independently.   Pt performed water walking for warm up - forwards, backwards, sideways 49m across pool x 2 reps each direction; Sidestepping with small squats 56m x 2 reps Marching forwards and backwards 64m x 2 reps with pt flexing Lt knee to tolerance  Pt performed standing hip abduction, extension and flexion on each leg 10 reps each direction with use of buoyant ankle cuff weights  Marching in place 10 reps each: Heel raises bil. LE's 10 reps Partial squats (small ROM due to Lt knee limitations) x 10 reps with pt holding at pool edge prn  Braiding 50m across length of pool with cues for sequence  Pt performed amb. 58m x 2 reps at end of session with UE reciprocal arm swing  in water for increased resistance  Pt requires buoyancy of water for support and for the ability to off load the body while in water;  Weight of LLE is reduced which allows More freedom of movement to facilitate increased AROM of Lt knee; pt requires viscosity of water for resistance for strengthening                                 PT Long Term Goals - 09/10/18 2110      PT LONG TERM GOAL #1   Title  Pt will report at least 50% pain reduction in Lt knee during 45" aquatic therapy treatment session    Time  4    Period  Weeks    Status  New      PT LONG TERM GOAL #2   Title  Pt will be independent in HEP for aquatic exercises for continuation upon D/C from PT.    Time  4    Period  Weeks    Status  New            Plan - 09/10/18 2059    Clinical Impression Statement  Pt tolerating aquatic exercises well - contnues to c/o  Lt knee pain with closed chain flexion exercises in pool; pt states her mobility  significantly increases after aquatic therapy but tightens back up the following day    Rehab Potential  Good    PT Frequency  1x / week   2x/week for first week then 1x/week for 4 weeks for aquatic therapy   PT Duration  4 weeks    PT Treatment/Interventions  ADLs/Self Care Home Management;Aquatic Therapy;Balance training;Therapeutic exercise;Therapeutic activities;Gait training;Neuromuscular re-education;Patient/family education    PT Next Visit Plan  D/C due to TKA scheduled on 09-22-18    Consulted and Agree with Plan of Care  Patient       Patient will benefit from skilled therapeutic intervention in order to improve the following deficits and impairments:  Difficulty walking, Pain, Decreased strength, Decreased balance, Decreased activity tolerance, Impaired flexibility  Visit Diagnosis: 1. Other abnormalities of gait and mobility   2. Muscle weakness (generalized)        Problem List Patient Active Problem List   Diagnosis Date Noted  . Alcohol use, unspecified with unspecified alcohol-induced disorder (HCC) 05/27/2018  . PTSD (post-traumatic stress disorder) 05/30/2016  . Alpha thalassemia trait 05/30/2016  . Hypertension 05/30/2016  . Rotator cuff tendonitis, left 05/30/2016  . Osteoarthritis of left knee 05/30/2016  . Carpal tunnel syndrome 05/30/2016  . Prediabetes 05/30/2016  . Onychomycosis 05/30/2016  . S/P tonsillectomy 05/30/2016  . Goiter, toxic diffuse 05/30/2016  . Chronic tonsillitis 05/30/2016    Merik Mignano, Donavan BurnetLinda Suzanne, PT, ATRIC 09/10/2018, 9:12 PM  [facility] [address] [place], [place], [address] Phone: [phone]   Fax:  [phone]  Name: Terrilee Croakhyllis Denise Escue MRN: [id number] Date of Birth: [birth year]

## 2018-09-16 ENCOUNTER — Other Ambulatory Visit: Payer: Self-pay

## 2018-09-16 ENCOUNTER — Encounter (HOSPITAL_COMMUNITY): Payer: Self-pay | Admitting: Psychiatry

## 2018-09-16 ENCOUNTER — Ambulatory Visit: Payer: Medicare Other | Admitting: Physical Therapy

## 2018-09-16 ENCOUNTER — Ambulatory Visit (INDEPENDENT_AMBULATORY_CARE_PROVIDER_SITE_OTHER): Payer: Medicare Other | Admitting: Psychiatry

## 2018-09-16 DIAGNOSIS — F319 Bipolar disorder, unspecified: Secondary | ICD-10-CM | POA: Diagnosis not present

## 2018-09-16 DIAGNOSIS — F4310 Post-traumatic stress disorder, unspecified: Secondary | ICD-10-CM

## 2018-09-16 MED ORDER — LAMOTRIGINE 25 MG PO TABS: ORAL_TABLET | ORAL | 0 refills | Status: DC

## 2018-09-16 NOTE — Pre-Procedure Instructions (Signed)
[facility]  09/16/2018      [facility] [place] - [id number] [facility] [address] [place] [address] [place] [id number] Phone: [phone] Fax: [phone]  [id number] [place], [place] - [address] [facility] [place] [id number] Phone: [phone] Fax: [phone]    Your procedure is scheduled on July 28  [facility] at 9:20A.M.  Call this number if you have problems the morning of surgery:  [phone]   Remember:  Do not eat after midnight.  You may drink clear liquids until 8:20 am. Clear liquids allowed are:                    Water, Juice (non-citric and without pulp), Carbonated beverages, Clear Tea, Black Coffee only, Plain Jell-O only and Gatorade                      Please complete your PRE-SURGERY ENSURE that was provided to you by 8:20 the morning of surgery.  Please, if able, drink it in one setting. DO NOT SIP.     Take these medicines the morning of surgery with A SIP OF WATER :              Tylenol if needed              proair if needed--bring to hospital              Cetirizine (zyrtec)              Cyclobenzaprine (flexeril) if needed              Famotidine (pepcid) if needed              flovent if needed--bring to hospital              Gabapentin (neurontin)              Lamotrigine (lamictal)              Montelukast (singular) if needed              Compazine if needed                7 days prior to surgery STOP taking any Aspirin (unless otherwise instructed by your surgeon), Aleve, Naproxen, Ibuprofen, Motrin, Advil, Goody's, BC's, all herbal medications, fish oil, and all vitamins.     Do not wear jewelry, make-up or nail polish.  Do not wear lotions, powders, or perfumes, or deodorant.  Do not shave 48 hours prior to surgery.  Men may shave face and neck.  Do not bring valuables to the hospital.  [facility] is  not responsible for any belongings or valuables.  Contacts, dentures or bridgework may not be worn into surgery.  Leave your suitcase in the car.  After surgery it may be brought to your room.  For patients admitted to the hospital, discharge time will be determined by your treatment team.  Patients discharged the day of surgery will not be allowed to drive home.    Special instructions:   [facility]- Preparing For Surgery  Before surgery, you can play an important role. Because skin is not sterile, your skin needs to be as free of germs as possible. You can reduce the number of germs on your skin by washing with CHG (chlorahexidine  gluconate) Soap before surgery.  CHG is an antiseptic cleaner which kills germs and bonds with the skin to continue killing germs even after washing.    Oral Hygiene is also important to reduce your risk of infection.  Remember - BRUSH YOUR TEETH THE MORNING OF SURGERY WITH YOUR REGULAR TOOTHPASTE  Please do not use if you have an allergy to CHG or antibacterial soaps. If your skin becomes reddened/irritated stop using the CHG.  Do not shave (including legs and underarms) for at least 48 hours prior to first CHG shower. It is OK to shave your face.  Please follow these instructions carefully.   1. Shower the NIGHT BEFORE SURGERY and the MORNING OF SURGERY with CHG.   2. If you chose to wash your hair, wash your hair first as usual with your normal shampoo.  3. After you shampoo, rinse your hair and body thoroughly to remove the shampoo.  4. Use CHG as you would any other liquid soap. You can apply CHG directly to the skin and wash gently with a scrungie or a clean washcloth.   5. Apply the CHG Soap to your body ONLY FROM THE NECK DOWN.  Do not use on open wounds or open sores. Avoid contact with your eyes, ears, mouth and genitals (private parts). Wash Face and genitals (private parts)  with your normal soap.  6. Wash thoroughly, paying special attention  to the area where your surgery will be performed.  7. Thoroughly rinse your body with warm water from the neck down.  8. DO NOT shower/wash with your normal soap after using and rinsing off the CHG Soap.  9. Pat yourself dry with a CLEAN TOWEL.  10. Wear CLEAN PAJAMAS to bed the night before surgery, wear comfortable clothes the morning of surgery  11. Place CLEAN SHEETS on your bed the night of your first shower and DO NOT SLEEP WITH PETS.    Day of Surgery:  Do not apply any deodorants/lotions.  Please wear clean clothes to the hospital/surgery center.   Remember to brush your teeth WITH YOUR REGULAR TOOTHPASTE.    Please read over the following fact sheets that you were given. Coughing and Deep Breathing, MRSA Information and Surgical Site Infection Prevention
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
## 2018-09-17 ENCOUNTER — Encounter (HOSPITAL_COMMUNITY): Payer: Self-pay

## 2018-09-17 ENCOUNTER — Other Ambulatory Visit: Payer: Self-pay

## 2018-09-17 ENCOUNTER — Encounter (HOSPITAL_COMMUNITY)
Admission: RE | Admit: 2018-09-17 | Discharge: 2018-09-17 | Disposition: A | Discharge status: Home / Self Care | Payer: Medicare Other | Source: Ambulatory Visit | Attending: Orthopedic Surgery | Admitting: Orthopedic Surgery

## 2018-09-17 DIAGNOSIS — R7303 Prediabetes: Secondary | ICD-10-CM | POA: Insufficient documentation

## 2018-09-17 DIAGNOSIS — I10 Essential (primary) hypertension: Secondary | ICD-10-CM | POA: Diagnosis not present

## 2018-09-17 DIAGNOSIS — Z01818 Encounter for other preprocedural examination: Secondary | ICD-10-CM | POA: Diagnosis not present

## 2018-09-17 DIAGNOSIS — Z20828 Contact with and (suspected) exposure to other viral communicable diseases: Secondary | ICD-10-CM | POA: Diagnosis not present

## 2018-09-17 DIAGNOSIS — M1712 Unilateral primary osteoarthritis, left knee: Secondary | ICD-10-CM | POA: Insufficient documentation

## 2018-09-17 DIAGNOSIS — Z79899 Other long term (current) drug therapy: Secondary | ICD-10-CM | POA: Diagnosis not present

## 2018-09-17 DIAGNOSIS — Z87891 Personal history of nicotine dependence: Secondary | ICD-10-CM | POA: Diagnosis not present

## 2018-09-17 HISTORY — DX: Gastro-esophageal reflux disease without esophagitis: K21.9

## 2018-09-17 HISTORY — DX: Sleep apnea, unspecified: G47.30

## 2018-09-17 HISTORY — DX: Unspecified asthma, uncomplicated: J45.909

## 2018-09-17 HISTORY — DX: Unspecified osteoarthritis, unspecified site: M19.90

## 2018-09-17 HISTORY — DX: Anxiety disorder, unspecified: F41.9

## 2018-09-17 LAB — CBC
HCT: 36.1 % (ref 36.0–46.0)
Hemoglobin: 11.2 g/dL — ABNORMAL LOW (ref 12.0–15.0)
MCH: 24 pg — ABNORMAL LOW (ref 26.0–34.0)
MCHC: 31 g/dL (ref 30.0–36.0)
MCV: 77.5 fL — ABNORMAL LOW (ref 80.0–100.0)
Platelets: 265 10*3/uL (ref 150–400)
RBC: 4.66 MIL/uL (ref 3.87–5.11)
RDW: 15.4 % (ref 11.5–15.5)
WBC: 4.4 10*3/uL (ref 4.0–10.5)
nRBC: 0 % (ref 0.0–0.2)

## 2018-09-17 LAB — URINALYSIS, ROUTINE W REFLEX MICROSCOPIC
Bilirubin Urine: NEGATIVE
Glucose, UA: NEGATIVE mg/dL
Hgb urine dipstick: NEGATIVE
Ketones, ur: NEGATIVE mg/dL
Leukocytes,Ua: NEGATIVE
Nitrite: NEGATIVE
Protein, ur: NEGATIVE mg/dL
Specific Gravity, Urine: 1.017 (ref 1.005–1.030)
pH: 5 (ref 5.0–8.0)

## 2018-09-17 LAB — BASIC METABOLIC PANEL
Anion gap: 10 (ref 5–15)
BUN: 20 mg/dL (ref 8–23)
CO2: 24 mmol/L (ref 22–32)
Calcium: 8.8 mg/dL — ABNORMAL LOW (ref 8.9–10.3)
Chloride: 105 mmol/L (ref 98–111)
Creatinine, Ser: 1 mg/dL (ref 0.44–1.00)
GFR calc Af Amer: >60 mL/min (ref >60)
GFR calc non Af Amer: 59 mL/min — ABNORMAL LOW (ref >60)
Glucose, Bld: 90 mg/dL (ref 70–99)
Potassium: 3.9 mmol/L (ref 3.5–5.1)
Sodium: 139 mmol/L (ref 135–145)

## 2018-09-17 LAB — SURGICAL PCR SCREEN
MRSA, PCR: NEGATIVE
Staphylococcus aureus: NEGATIVE

## 2018-09-17 NOTE — Progress Notes (Signed)
PCP - Pulp,Cammie Cardiologist - na  Chest x-ray - na EKG - today Stress Test - na ECHO - na Cardiac Cath - na  Sleep Study - had one yrs. ago CPAP - pt. No longer needs cpap-states MD took her off the machine 7 yrs. ago  Fasting Blood Sugar - na Checks Blood Sugar _____ times a day  Blood Thinner Instructions:na Aspirin Instructions:na  Anesthesia review:   Patient denies shortness of breath, fever, cough and chest pain at PAT appointment   Patient verbalized understanding of instructions that were given to them at the PAT appointment. Patient was also instructed that they will need to review over the PAT instructions again at home before surgery.

## 2018-09-18 ENCOUNTER — Other Ambulatory Visit (HOSPITAL_COMMUNITY): Payer: Medicare Other

## 2018-09-18 LAB — URINE CULTURE: Culture: <10000 — AB

## 2018-09-19 ENCOUNTER — Other Ambulatory Visit (HOSPITAL_COMMUNITY)
Admission: RE | Admit: 2018-09-19 | Discharge: 2018-09-19 | Disposition: A | Discharge status: Home / Self Care | Payer: Medicare Other | Source: Ambulatory Visit | Attending: Orthopedic Surgery | Admitting: Orthopedic Surgery

## 2018-09-19 DIAGNOSIS — Z01818 Encounter for other preprocedural examination: Secondary | ICD-10-CM | POA: Diagnosis not present

## 2018-09-19 LAB — SARS CORONAVIRUS 2 (TAT 6-24 HRS): SARS Coronavirus 2: NEGATIVE

## 2018-09-21 MED ORDER — VANCOMYCIN HCL 10 G IV SOLR
1500.0000 mg | INTRAVENOUS | Status: AC
  Administered 2018-09-22: 1500 mg via INTRAVENOUS
  Filled 2018-09-21: qty 1500

## 2018-09-21 NOTE — Anesthesia Preprocedure Evaluation (Addendum)
Anesthesia Evaluation  Patient identified by MRN, date of birth, ID band Patient awake    Reviewed: Allergy & Precautions, NPO status , Patient's Chart, lab work & pertinent test results  Airway Mallampati: II  TM Distance: >3 FB Neck ROM: Full    Dental  (+) Edentulous Upper, Missing   Pulmonary asthma , sleep apnea , former smoker,    Pulmonary exam normal breath sounds clear to auscultation       Cardiovascular hypertension, Pt. on medications Normal cardiovascular exam Rhythm:Regular Rate:Normal  ECG:NSR, rate 62   Neuro/Psych PSYCHIATRIC DISORDERS Anxiety PTSD (post-traumatic stress disorder) Neuromuscular disease    GI/Hepatic Neg liver ROS, GERD  Medicated and Controlled,  Endo/Other  negative endocrine ROS  Renal/GU negative Renal ROS     Musculoskeletal negative musculoskeletal ROS (+)   Abdominal (+) + obese,   Peds  Hematology Alpha thalassemia trait   Anesthesia Other Findings left knee osteoarthritis  Reproductive/Obstetrics                            Anesthesia Physical Anesthesia Plan  ASA: III  Anesthesia Plan: Regional and General   Post-op Pain Management: GA combined w/ Regional for post-op pain   Induction: Intravenous  PONV Risk Score and Plan: 4 or greater and Midazolam, Dexamethasone, Ondansetron and Treatment may vary due to age or medical condition  Airway Management Planned: Oral ETT  Additional Equipment:   Intra-op Plan:   Post-operative Plan: Extubation in OR  Informed Consent: I have reviewed the patients History and Physical, chart, labs and discussed the procedure including the risks, benefits and alternatives for the proposed anesthesia with the patient or authorized representative who has indicated his/her understanding and acceptance.     Dental advisory given  Plan Discussed with: CRNA and Surgeon  Anesthesia Plan Comments:         Anesthesia Quick Evaluation

## 2018-09-22 ENCOUNTER — Ambulatory Visit (HOSPITAL_COMMUNITY): Payer: Medicare Other | Admitting: Anesthesiology

## 2018-09-22 ENCOUNTER — Observation Stay (HOSPITAL_COMMUNITY)
Admission: RE | Admit: 2018-09-22 | Discharge: 2018-09-26 | Disposition: A | Discharge status: Skilled Nursing Facility | Payer: Medicare Other | Attending: Orthopedic Surgery | Admitting: Orthopedic Surgery

## 2018-09-22 ENCOUNTER — Other Ambulatory Visit: Payer: Self-pay

## 2018-09-22 ENCOUNTER — Encounter (HOSPITAL_COMMUNITY): Payer: Self-pay

## 2018-09-22 ENCOUNTER — Encounter (HOSPITAL_COMMUNITY)
Admission: RE | Disposition: A | Discharge status: Skilled Nursing Facility | Payer: Self-pay | Source: Home / Self Care | Attending: Orthopedic Surgery

## 2018-09-22 DIAGNOSIS — I10 Essential (primary) hypertension: Secondary | ICD-10-CM | POA: Diagnosis not present

## 2018-09-22 DIAGNOSIS — G4730 Sleep apnea, unspecified: Secondary | ICD-10-CM | POA: Insufficient documentation

## 2018-09-22 DIAGNOSIS — Z87891 Personal history of nicotine dependence: Secondary | ICD-10-CM | POA: Diagnosis not present

## 2018-09-22 DIAGNOSIS — Z20828 Contact with and (suspected) exposure to other viral communicable diseases: Secondary | ICD-10-CM | POA: Insufficient documentation

## 2018-09-22 DIAGNOSIS — D563 Thalassemia minor: Secondary | ICD-10-CM | POA: Insufficient documentation

## 2018-09-22 DIAGNOSIS — Z9884 Bariatric surgery status: Secondary | ICD-10-CM | POA: Diagnosis not present

## 2018-09-22 DIAGNOSIS — M1712 Unilateral primary osteoarthritis, left knee: Principal | ICD-10-CM | POA: Insufficient documentation

## 2018-09-22 DIAGNOSIS — M25562 Pain in left knee: Secondary | ICD-10-CM | POA: Diagnosis present

## 2018-09-22 DIAGNOSIS — K219 Gastro-esophageal reflux disease without esophagitis: Secondary | ICD-10-CM | POA: Diagnosis not present

## 2018-09-22 DIAGNOSIS — M25762 Osteophyte, left knee: Secondary | ICD-10-CM | POA: Insufficient documentation

## 2018-09-22 HISTORY — PX: TOTAL KNEE ARTHROPLASTY: SHX125

## 2018-09-22 SURGERY — ARTHROPLASTY, KNEE, TOTAL
Anesthesia: Regional | Site: Knee | Laterality: Left

## 2018-09-22 MED ORDER — ASPIRIN 81 MG PO CHEW
81.0000 mg | CHEWABLE_TABLET | Freq: Two times a day (BID) | ORAL | Status: DC
  Administered 2018-09-22 – 2018-09-26 (×8): 81 mg via ORAL
  Filled 2018-09-22 (×8): qty 1

## 2018-09-22 MED ORDER — NON FORMULARY
Status: DC | PRN
  Administered 2018-09-22: 450 mL via TOPICAL

## 2018-09-22 MED ORDER — CLONIDINE HCL (ANALGESIA) 100 MCG/ML EP SOLN
EPIDURAL | Status: DC | PRN
  Administered 2018-09-22: 1 mL via INTRA_ARTICULAR

## 2018-09-22 MED ORDER — CHLORHEXIDINE GLUCONATE 4 % EX LIQD: 60.0000 mL | Freq: Once | CUTANEOUS | Status: DC

## 2018-09-22 MED ORDER — TRANEXAMIC ACID-NACL 1000-0.7 MG/100ML-% IV SOLN
INTRAVENOUS | Status: AC
  Filled 2018-09-22: qty 100

## 2018-09-22 MED ORDER — PHENOL 1.4 % MT LIQD: 1.0000 | OROMUCOSAL | Status: DC | PRN

## 2018-09-22 MED ORDER — ONDANSETRON HCL 4 MG/2ML IJ SOLN: 4.0000 mg | Freq: Four times a day (QID) | INTRAMUSCULAR | Status: DC | PRN

## 2018-09-22 MED ORDER — DEXMEDETOMIDINE HCL 200 MCG/2ML IV SOLN
INTRAVENOUS | Status: DC | PRN
  Administered 2018-09-22: 16 ug via INTRAVENOUS

## 2018-09-22 MED ORDER — FENTANYL CITRATE (PF) 100 MCG/2ML IJ SOLN
25.0000 ug | INTRAMUSCULAR | Status: DC | PRN
  Administered 2018-09-22 (×2): 50 ug via INTRAVENOUS

## 2018-09-22 MED ORDER — DEXAMETHASONE SODIUM PHOSPHATE 10 MG/ML IJ SOLN
INTRAMUSCULAR | Status: AC
  Filled 2018-09-22: qty 1

## 2018-09-22 MED ORDER — LIDOCAINE 2% (20 MG/ML) 5 ML SYRINGE
INTRAMUSCULAR | Status: DC | PRN
  Administered 2018-09-22: 60 mg via INTRAVENOUS

## 2018-09-22 MED ORDER — PROPOFOL 10 MG/ML IV BOLUS
INTRAVENOUS | Status: AC
  Filled 2018-09-22: qty 20

## 2018-09-22 MED ORDER — MENTHOL 3 MG MT LOZG: 1.0000 | LOZENGE | OROMUCOSAL | Status: DC | PRN

## 2018-09-22 MED ORDER — ACETAMINOPHEN 10 MG/ML IV SOLN: 1000.0000 mg | Freq: Once | INTRAVENOUS | Status: DC | PRN

## 2018-09-22 MED ORDER — PHENYLEPHRINE HCL (PRESSORS) 10 MG/ML IV SOLN
INTRAVENOUS | Status: DC | PRN
  Administered 2018-09-22: 80 ug via INTRAVENOUS
  Administered 2018-09-22: 120 ug via INTRAVENOUS
  Administered 2018-09-22: 80 ug via INTRAVENOUS
  Administered 2018-09-22: 120 ug via INTRAVENOUS
  Administered 2018-09-22 (×2): 80 ug via INTRAVENOUS

## 2018-09-22 MED ORDER — FENTANYL CITRATE (PF) 100 MCG/2ML IJ SOLN
INTRAMUSCULAR | Status: AC
  Filled 2018-09-22: qty 2

## 2018-09-22 MED ORDER — PROPOFOL 500 MG/50ML IV EMUL
INTRAVENOUS | Status: DC | PRN
  Administered 2018-09-22: 25 ug/kg/min via INTRAVENOUS

## 2018-09-22 MED ORDER — FENTANYL CITRATE (PF) 250 MCG/5ML IJ SOLN
INTRAMUSCULAR | Status: AC
  Filled 2018-09-22: qty 5

## 2018-09-22 MED ORDER — TRANEXAMIC ACID-NACL 1000-0.7 MG/100ML-% IV SOLN
1000.0000 mg | INTRAVENOUS | Status: AC
  Administered 2018-09-22: 1000 mg via INTRAVENOUS

## 2018-09-22 MED ORDER — METHOCARBAMOL 500 MG PO TABS
500.0000 mg | ORAL_TABLET | Freq: Four times a day (QID) | ORAL | Status: DC | PRN
  Administered 2018-09-22 – 2018-09-26 (×11): 500 mg via ORAL
  Filled 2018-09-22 (×13): qty 1

## 2018-09-22 MED ORDER — POVIDONE-IODINE 10 % EX SWAB: 2.0000 "application " | Freq: Once | CUTANEOUS | Status: DC

## 2018-09-22 MED ORDER — BUPIVACAINE-EPINEPHRINE (PF) 0.5% -1:200000 IJ SOLN
INTRAMUSCULAR | Status: DC | PRN
  Administered 2018-09-22: 10 mL
  Administered 2018-09-22: 20 mL

## 2018-09-22 MED ORDER — ONDANSETRON HCL 4 MG/2ML IJ SOLN
INTRAMUSCULAR | Status: DC | PRN
  Administered 2018-09-22: 4 mg via INTRAVENOUS

## 2018-09-22 MED ORDER — ACETAMINOPHEN 10 MG/ML IV SOLN
INTRAVENOUS | Status: AC
  Filled 2018-09-22: qty 100

## 2018-09-22 MED ORDER — 0.9 % SODIUM CHLORIDE (POUR BTL) OPTIME
TOPICAL | Status: DC | PRN
  Administered 2018-09-22: 1000 mL
  Administered 2018-09-22: 3000 mL

## 2018-09-22 MED ORDER — BUPIVACAINE-EPINEPHRINE (PF) 0.5% -1:200000 IJ SOLN
INTRAMUSCULAR | Status: AC
  Filled 2018-09-22: qty 30

## 2018-09-22 MED ORDER — MIDAZOLAM HCL 2 MG/2ML IJ SOLN
INTRAMUSCULAR | Status: AC
  Filled 2018-09-22: qty 2

## 2018-09-22 MED ORDER — VANCOMYCIN HCL IN DEXTROSE 1-5 GM/200ML-% IV SOLN
1000.0000 mg | Freq: Two times a day (BID) | INTRAVENOUS | Status: AC
  Administered 2018-09-22 – 2018-09-23 (×2): 1000 mg via INTRAVENOUS
  Filled 2018-09-22 (×2): qty 200

## 2018-09-22 MED ORDER — OXYCODONE HCL 5 MG PO TABS
10.0000 mg | ORAL_TABLET | ORAL | Status: DC | PRN
  Administered 2018-09-22: 10 mg via ORAL
  Administered 2018-09-23 (×2): 15 mg via ORAL
  Administered 2018-09-23: 10 mg via ORAL
  Administered 2018-09-24: 15 mg via ORAL
  Administered 2018-09-24: 10 mg via ORAL
  Administered 2018-09-24: 15 mg via ORAL
  Administered 2018-09-24 – 2018-09-25 (×2): 10 mg via ORAL
  Administered 2018-09-25: 15 mg via ORAL
  Administered 2018-09-26: 13:00:00 10 mg via ORAL
  Filled 2018-09-22 (×2): qty 3
  Filled 2018-09-22 (×2): qty 2
  Filled 2018-09-22 (×3): qty 3
  Filled 2018-09-22: qty 2
  Filled 2018-09-22: qty 3

## 2018-09-22 MED ORDER — OXYCODONE HCL 5 MG PO TABS
5.0000 mg | ORAL_TABLET | ORAL | Status: DC | PRN
  Administered 2018-09-22 – 2018-09-23 (×2): 10 mg via ORAL
  Administered 2018-09-24: 5 mg via ORAL
  Administered 2018-09-24 – 2018-09-26 (×5): 10 mg via ORAL
  Filled 2018-09-22: qty 1
  Filled 2018-09-22 (×3): qty 2
  Filled 2018-09-22: qty 1
  Filled 2018-09-22 (×7): qty 2

## 2018-09-22 MED ORDER — CLONIDINE HCL (ANALGESIA) 100 MCG/ML EP SOLN
EPIDURAL | Status: DC | PRN
  Administered 2018-09-22: 100 ug

## 2018-09-22 MED ORDER — ACETAMINOPHEN 10 MG/ML IV SOLN
INTRAVENOUS | Status: DC | PRN
  Administered 2018-09-22: 1000 mg via INTRAVENOUS

## 2018-09-22 MED ORDER — TRANEXAMIC ACID 1000 MG/10ML IV SOLN
2000.0000 mg | Freq: Once | INTRAVENOUS | Status: DC
  Filled 2018-09-22: qty 20

## 2018-09-22 MED ORDER — BUPIVACAINE LIPOSOME 1.3 % IJ SUSP
INTRAMUSCULAR | Status: DC | PRN
  Administered 2018-09-22: 20 mL

## 2018-09-22 MED ORDER — METHOCARBAMOL 1000 MG/10ML IJ SOLN
500.0000 mg | Freq: Four times a day (QID) | INTRAVENOUS | Status: DC | PRN
  Filled 2018-09-22: qty 5

## 2018-09-22 MED ORDER — ROPIVACAINE HCL 5 MG/ML IJ SOLN
INTRAMUSCULAR | Status: DC | PRN
  Administered 2018-09-22: 30 mL via PERINEURAL

## 2018-09-22 MED ORDER — ARTIFICIAL TEARS OPHTHALMIC OINT
TOPICAL_OINTMENT | OPHTHALMIC | Status: DC | PRN
  Administered 2018-09-22: 1 via OPHTHALMIC

## 2018-09-22 MED ORDER — SODIUM CHLORIDE 0.9 % IR SOLN
Status: DC | PRN
  Administered 2018-09-22: 3000 mL

## 2018-09-22 MED ORDER — METOCLOPRAMIDE HCL 5 MG PO TABS: 5.0000 mg | ORAL_TABLET | Freq: Three times a day (TID) | ORAL | Status: DC | PRN

## 2018-09-22 MED ORDER — FENTANYL CITRATE (PF) 250 MCG/5ML IJ SOLN
INTRAMUSCULAR | Status: DC | PRN
  Administered 2018-09-22: 100 ug via INTRAVENOUS
  Administered 2018-09-22 (×2): 25 ug via INTRAVENOUS
  Administered 2018-09-22 (×3): 50 ug via INTRAVENOUS
  Administered 2018-09-22 (×2): 25 ug via INTRAVENOUS

## 2018-09-22 MED ORDER — LACTATED RINGERS IV SOLN
INTRAVENOUS | Status: DC
  Administered 2018-09-22 (×3): via INTRAVENOUS

## 2018-09-22 MED ORDER — BUPIVACAINE LIPOSOME 1.3 % IJ SUSP
20.0000 mL | Freq: Once | INTRAMUSCULAR | Status: DC
  Filled 2018-09-22: qty 20

## 2018-09-22 MED ORDER — DOCUSATE SODIUM 100 MG PO CAPS
100.0000 mg | ORAL_CAPSULE | Freq: Two times a day (BID) | ORAL | Status: DC
  Administered 2018-09-22 – 2018-09-26 (×8): 100 mg via ORAL
  Filled 2018-09-22 (×8): qty 1

## 2018-09-22 MED ORDER — MIDAZOLAM HCL 2 MG/2ML IJ SOLN
1.0000 mg | INTRAMUSCULAR | Status: DC | PRN
  Administered 2018-09-22: 1 mg via INTRAVENOUS

## 2018-09-22 MED ORDER — SODIUM CHLORIDE 0.9% FLUSH
INTRAVENOUS | Status: DC | PRN
  Administered 2018-09-22: 20 mL

## 2018-09-22 MED ORDER — NAPROXEN 250 MG PO TABS
250.0000 mg | ORAL_TABLET | Freq: Two times a day (BID) | ORAL | Status: DC
  Administered 2018-09-22 – 2018-09-26 (×7): 250 mg via ORAL
  Filled 2018-09-22 (×10): qty 1

## 2018-09-22 MED ORDER — PROPOFOL 10 MG/ML IV BOLUS
INTRAVENOUS | Status: DC | PRN
  Administered 2018-09-22: 200 mg via INTRAVENOUS

## 2018-09-22 MED ORDER — ONDANSETRON HCL 4 MG PO TABS: 4.0000 mg | ORAL_TABLET | Freq: Four times a day (QID) | ORAL | Status: DC | PRN

## 2018-09-22 MED ORDER — METOCLOPRAMIDE HCL 5 MG/ML IJ SOLN: 5.0000 mg | Freq: Three times a day (TID) | INTRAMUSCULAR | Status: DC | PRN

## 2018-09-22 MED ORDER — ONDANSETRON HCL 4 MG/2ML IJ SOLN
INTRAMUSCULAR | Status: AC
  Filled 2018-09-22: qty 2

## 2018-09-22 MED ORDER — ROCURONIUM BROMIDE 100 MG/10ML IV SOLN
INTRAVENOUS | Status: DC | PRN
  Administered 2018-09-22: 50 mg via INTRAVENOUS
  Administered 2018-09-22: 30 mg via INTRAVENOUS

## 2018-09-22 MED ORDER — DEXAMETHASONE SODIUM PHOSPHATE 10 MG/ML IJ SOLN
INTRAMUSCULAR | Status: DC | PRN
  Administered 2018-09-22: 10 mg via INTRAVENOUS

## 2018-09-22 MED ORDER — MIDAZOLAM HCL 5 MG/5ML IJ SOLN
INTRAMUSCULAR | Status: DC | PRN
  Administered 2018-09-22: 2 mg via INTRAVENOUS

## 2018-09-22 MED ORDER — ACETAMINOPHEN 325 MG PO TABS
325.0000 mg | ORAL_TABLET | Freq: Four times a day (QID) | ORAL | Status: DC | PRN
  Administered 2018-09-23 – 2018-09-26 (×5): 650 mg via ORAL
  Filled 2018-09-22 (×5): qty 2

## 2018-09-22 MED ORDER — TRANEXAMIC ACID 1000 MG/10ML IV SOLN
INTRAVENOUS | Status: DC | PRN
  Administered 2018-09-22: 2000 mg via TOPICAL

## 2018-09-22 MED ORDER — SUGAMMADEX SODIUM 200 MG/2ML IV SOLN
INTRAVENOUS | Status: DC | PRN
  Administered 2018-09-22: 200 mg via INTRAVENOUS

## 2018-09-22 MED ORDER — HYDROMORPHONE HCL 1 MG/ML IJ SOLN: 0.5000 mg | INTRAMUSCULAR | Status: DC | PRN

## 2018-09-22 MED ORDER — ONDANSETRON HCL 4 MG/2ML IJ SOLN: 4.0000 mg | Freq: Once | INTRAMUSCULAR | Status: DC | PRN

## 2018-09-22 SURGICAL SUPPLY — 78 items
BAG DECANTER FOR FLEXI CONT (MISCELLANEOUS) ×3 IMPLANT
BANDAGE ESMARK 6X9 LF (GAUZE/BANDAGES/DRESSINGS) ×1 IMPLANT
BLADE SAG 18X100X1.27 (BLADE) ×3 IMPLANT
BLADE SAW SGTL 13.0X1.19X90.0M (BLADE) IMPLANT
BNDG COHESIVE 6X5 TAN STRL LF (GAUZE/BANDAGES/DRESSINGS) ×3 IMPLANT
BNDG ELASTIC 6X15 VLCR STRL LF (GAUZE/BANDAGES/DRESSINGS) ×3 IMPLANT
BNDG ESMARK 6X9 LF (GAUZE/BANDAGES/DRESSINGS) ×3
BOWL SMART MIX CTS (DISPOSABLE) IMPLANT
CLOSURE WOUND 1/2 X4 (GAUZE/BANDAGES/DRESSINGS) ×1
COMP FEMORAL TRIATHLON SZ3 (Joint) ×3 IMPLANT
COMPONENT FEMRL TRIATHLON SZ3 (Joint) IMPLANT
CONT SPEC 4OZ CLIKSEAL STRL BL (MISCELLANEOUS) ×3 IMPLANT
COVER SURGICAL LIGHT HANDLE (MISCELLANEOUS) ×3 IMPLANT
COVER WAND RF STERILE (DRAPES) ×1 IMPLANT
CUFF TOURN SGL QUICK 34 (TOURNIQUET CUFF) ×2
CUFF TOURN SGL QUICK 42 (TOURNIQUET CUFF) IMPLANT
CUFF TRNQT CYL 34X4.125X (TOURNIQUET CUFF) ×1 IMPLANT
DECANTER SPIKE VIAL GLASS SM (MISCELLANEOUS) ×3 IMPLANT
DRAPE INCISE IOBAN 66X45 STRL (DRAPES) IMPLANT
DRAPE ORTHO SPLIT 77X108 STRL (DRAPES) ×6
DRAPE SURG ORHT 6 SPLT 77X108 (DRAPES) ×3 IMPLANT
DRAPE U-SHAPE 47X51 STRL (DRAPES) ×3 IMPLANT
DRSG AQUACEL AG ADV 3.5X14 (GAUZE/BANDAGES/DRESSINGS) ×2 IMPLANT
DURAPREP 26ML APPLICATOR (WOUND CARE) ×6 IMPLANT
ELECT CAUTERY BLADE 6.4 (BLADE) ×3 IMPLANT
ELECT REM PT RETURN 9FT ADLT (ELECTROSURGICAL) ×3
ELECTRODE REM PT RTRN 9FT ADLT (ELECTROSURGICAL) ×1 IMPLANT
GAUZE SPONGE 4X4 12PLY STRL (GAUZE/BANDAGES/DRESSINGS) ×3 IMPLANT
GLOVE BIOGEL PI IND STRL 7.5 (GLOVE) ×1 IMPLANT
GLOVE BIOGEL PI IND STRL 8 (GLOVE) ×1 IMPLANT
GLOVE BIOGEL PI INDICATOR 7.5 (GLOVE) ×2
GLOVE BIOGEL PI INDICATOR 8 (GLOVE) ×2
GLOVE ECLIPSE 7.0 STRL STRAW (GLOVE) ×1 IMPLANT
GLOVE SURG ORTHO 8.0 STRL STRW (GLOVE) ×3 IMPLANT
GOWN STRL REUS W/ TWL LRG LVL3 (GOWN DISPOSABLE) ×3 IMPLANT
GOWN STRL REUS W/TWL LRG LVL3 (GOWN DISPOSABLE) ×6
HANDPIECE INTERPULSE COAX TIP (DISPOSABLE) ×2
HOOD PEEL AWAY FLYTE STAYCOOL (MISCELLANEOUS) ×9 IMPLANT
IMMOBILIZER KNEE 20 (SOFTGOODS)
IMMOBILIZER KNEE 20 THIGH 36 (SOFTGOODS) IMPLANT
IMMOBILIZER KNEE 22 (SOFTGOODS) ×2 IMPLANT
IMMOBILIZER KNEE 22 UNIV (SOFTGOODS) ×2 IMPLANT
IMMOBILIZER KNEE 24 THIGH 36 (MISCELLANEOUS) IMPLANT
IMMOBILIZER KNEE 24 UNIV (MISCELLANEOUS)
INSERT TIBIAL BEARING 11MM SZ4 (Knees) ×2 IMPLANT
JET LAVAGE IRRISEPT WOUND (IRRIGATION / IRRIGATOR) ×3
KIT BASIN OR (CUSTOM PROCEDURE TRAY) ×3 IMPLANT
KIT TURNOVER KIT B (KITS) ×3 IMPLANT
KNEE PATELLA ASYMMETRIC 10X35 (Knees) ×2 IMPLANT
KNEE TIBIAL COMP TRI SZ4 (Knees) ×2 IMPLANT
LAVAGE JET IRRISEPT WOUND (IRRIGATION / IRRIGATOR) IMPLANT
MANIFOLD NEPTUNE II (INSTRUMENTS) ×3 IMPLANT
NDL SPNL 18GX3.5 QUINCKE PK (NEEDLE) ×1 IMPLANT
NEEDLE 22X1 1/2 (OR ONLY) (NEEDLE) ×6 IMPLANT
NEEDLE SPNL 18GX3.5 QUINCKE PK (NEEDLE) ×3 IMPLANT
NS IRRIG 1000ML POUR BTL (IV SOLUTION) ×6 IMPLANT
PACK TOTAL JOINT (CUSTOM PROCEDURE TRAY) ×3 IMPLANT
PAD ARMBOARD 7.5X6 YLW CONV (MISCELLANEOUS) ×6 IMPLANT
PAD CAST 4YDX4 CTTN HI CHSV (CAST SUPPLIES) ×1 IMPLANT
PADDING CAST COTTON 4X4 STRL (CAST SUPPLIES) ×2
PADDING CAST COTTON 6X4 STRL (CAST SUPPLIES) ×3 IMPLANT
SET HNDPC FAN SPRY TIP SCT (DISPOSABLE) ×1 IMPLANT
STRIP CLOSURE SKIN 1/2X4 (GAUZE/BANDAGES/DRESSINGS) ×3 IMPLANT
SUCTION FRAZIER HANDLE 10FR (MISCELLANEOUS) ×2
SUCTION TUBE FRAZIER 10FR DISP (MISCELLANEOUS) ×1 IMPLANT
SUT MNCRL AB 3-0 PS2 18 (SUTURE) ×3 IMPLANT
SUT VIC AB 0 CT1 27 (SUTURE) ×6
SUT VIC AB 0 CT1 27XBRD ANBCTR (SUTURE) ×3 IMPLANT
SUT VIC AB 1 CT1 27 (SUTURE) ×10
SUT VIC AB 1 CT1 27XBRD ANBCTR (SUTURE) ×5 IMPLANT
SUT VIC AB 2-0 CT1 27 (SUTURE) ×8
SUT VIC AB 2-0 CT1 TAPERPNT 27 (SUTURE) ×4 IMPLANT
SYR 30ML LL (SYRINGE) ×9 IMPLANT
SYR TB 1ML LUER SLIP (SYRINGE) ×3 IMPLANT
TOWEL GREEN STERILE (TOWEL DISPOSABLE) ×6 IMPLANT
TOWEL GREEN STERILE FF (TOWEL DISPOSABLE) ×6 IMPLANT
TRAY CATH 16FR W/PLASTIC CATH (SET/KITS/TRAYS/PACK) IMPLANT
WATER STERILE IRR 1000ML POUR (IV SOLUTION) IMPLANT

## 2018-09-22 NOTE — Brief Op Note (Signed)
   09/22/2018  9:51 AM  PATIENT:  [name]  63 y.o. female  PRE-OPERATIVE DIAGNOSIS:  left knee osteoarthritis  POST-OPERATIVE DIAGNOSIS:  left knee osteoarthritis  PROCEDURE:  Procedure(s): LEFT TOTAL KNEE ARTHROPLASTY  SURGEON:  Surgeon(s): Meredith Pel, MD  ASSISTANT: magnant pa  ANESTHESIA:   general  EBL: 100 ml    Total I/O In: 1000 [I.V.:1000] Out: 100 [Blood:100]  BLOOD ADMINISTERED: none  DRAINS: none   LOCAL MEDICATIONS USED:  Marcaine exparel clonidine  SPECIMEN:  No Specimen  COUNTS:  YES  TOURNIQUET:   Total Tourniquet Time Documented: Thigh (Left) - 64 minutes Total: Thigh (Left) - 64 minutes   DICTATION: .Other Dictation: Dictation Number done  PLAN OF CARE: Admit to inpatient   PATIENT DISPOSITION:  PACU - hemodynamically stable

## 2018-09-22 NOTE — Progress Notes (Signed)
Orthopedic Tech Progress Note Patient Details:  Cheryl Prince 10-22-1955 [id number]  CPM Left Knee CPM Left Knee: On Left Knee Flexion (Degrees): 40 Left Knee Extension (Degrees): 0  Post Interventions Patient Tolerated: Well Instructions Provided: Care of device, Adjustment of device Ortho Devices Type of Ortho Device: Bone foam zero knee Ortho Device/Splint Location: LLE Ortho Device/Splint Interventions: Other (comment)   Post Interventions Patient Tolerated: Well Instructions Provided: Care of device, Adjustment of device   Janit Pagan 09/22/2018, 10:44 AM

## 2018-09-22 NOTE — Care Management Obs Status (Signed)
[place] NOTIFICATION   Patient Details  Name: Cheryl Prince MRN: [id number] Date of Birth: 12-22-1955   Medicare Observation Status Notification Given:  Yes    Bartholomew Crews, RN 09/22/2018, 5:48 PM

## 2018-09-22 NOTE — Progress Notes (Signed)
Patient refused to sign Code 50 stating she was here for a couple days and would be going to a rehab facility. She would not sign anything because "you know it will mess everything up."   Manya Silvas, RN CM Transitions of Care [phone]

## 2018-09-22 NOTE — Anesthesia Procedure Notes (Signed)
Procedure Name: Intubation Date/Time: 09/22/2018 7:33 AM Performed by: Jearld Pies, CRNA Pre-anesthesia Checklist: Patient identified, Emergency Drugs available, Suction available and Patient being monitored Patient Re-evaluated:Patient Re-evaluated prior to induction Oxygen Delivery Method: Circle System Utilized Preoxygenation: Pre-oxygenation with 100% oxygen Induction Type: IV induction Ventilation: Mask ventilation without difficulty and Oral airway inserted - appropriate to patient size Laryngoscope Size: Mac and 3 Grade View: Grade I Tube type: Oral Tube size: 7.0 mm Number of attempts: 1 Airway Equipment and Method: Stylet and Oral airway Placement Confirmation: ETT inserted through vocal cords under direct vision,  positive ETCO2 and breath sounds checked- equal and bilateral Secured at: 21 cm Tube secured with: Tape Dental Injury: Teeth and Oropharynx as per pre-operative assessment

## 2018-09-22 NOTE — Evaluation (Signed)
Physical Therapy Evaluation Patient Details Name: Cheryl Prince MRN: [id number] DOB: June 21, 1955 Today's Date: 09/22/2018   History of Present Illness  Pt is a 63 y/o female s/p L TKA. PMH includes asthma, HTN, and PTSD.   Clinical Impression  Pt is s/p surgery above with deficits below. Pt requiring min A to transfer to chair using RW. Mobility limited secondary to pain. Educated about knee precautions. Pt reports she plans to go to SNF at d/c prior to return home. Will continue to follow acutely to maximize functional mobility independence and safety.     Follow Up Recommendations Follow surgeon's recommendation for DC plan and follow-up therapies;Supervision for mobility/OOB    Equipment Recommendations  None recommended by PT    Recommendations for Other Services       Precautions / Restrictions Precautions Precautions: Knee Precaution Booklet Issued: No Precaution Comments: Verbally reviewed knee precautions with pt.  Restrictions Weight Bearing Restrictions: Yes LLE Weight Bearing: Weight bearing as tolerated      Mobility  Bed Mobility Overal bed mobility: Needs Assistance Bed Mobility: Supine to Sit     Supine to sit: Min assist     General bed mobility comments: Min A for LLE assist. Increased time required to come to sitting.   Transfers Overall transfer level: Needs assistance Equipment used: Rolling walker (2 wheeled) Transfers: Sit to/from [employer] CorporationStand;Stand Pivot Transfers Sit to Stand: Min assist Stand pivot transfers: Min assist       General transfer comment: Min A for lift assist and steadying to stand. Min A for steadying assist to transfer to chair. Further mobility limited secondary to pain.   Ambulation/Gait                Stairs            Wheelchair Mobility    Modified Rankin (Stroke Patients Only)       Balance Overall balance assessment: Needs assistance Sitting-balance support: No upper extremity supported;Feet  supported Sitting balance-Leahy Scale: Good     Standing balance support: Bilateral upper extremity supported;During functional activity Standing balance-Leahy Scale: Poor Standing balance comment: Reliant on BUE support                              Pertinent Vitals/Pain Pain Assessment: Faces Faces Pain Scale: Hurts even more Pain Location: L knee  Pain Descriptors / Indicators: Aching;Operative site guarding Pain Intervention(s): Limited activity within patient's tolerance;Monitored during session;Repositioned    Home Living Family/patient expects to be discharged to:: Skilled nursing facility                 Additional Comments: Pt reports [facility] is going [facility] at [facility]/c    Prior Function Level of Independence: Independent with assistive device(s)         Comments: Reports using cane vs RW depending on the day.      Hand Dominance        Extremity/Trunk Assessment   Upper Extremity Assessment Upper Extremity Assessment: Overall WFL for tasks assessed    Lower Extremity Assessment Lower Extremity Assessment: LLE deficits/detail LLE Deficits / Details: Deficits consistent with post op pain and weakness.     Cervical / Trunk Assessment Cervical / Trunk Assessment: Normal  Communication   Communication: No difficulties  Cognition Arousal/Alertness: Awake/alert Behavior During Therapy: WFL for tasks assessed/performed Overall Cognitive Status: No family/caregiver present to determine baseline cognitive functioning  General Comments: Pt reporting conflicting information throughout session.       General Comments      Exercises     Assessment/Plan    PT Assessment Patient needs continued PT services  PT Problem List Decreased strength;Decreased balance;Decreased range of motion;Decreased mobility;Decreased knowledge of use of DME;Decreased knowledge of precautions;Decreased  cognition;Decreased activity tolerance;Pain       PT Treatment Interventions DME instruction;Gait training;Functional mobility training;Balance training;Therapeutic exercise;Therapeutic activities;Patient/family education    PT Goals (Current goals can be found in the Care Plan section)  Acute Rehab PT Goals Patient Stated Goal: to decrease pain  PT Goal Formulation: With patient Time For Goal Achievement: 10/06/18 Potential to Achieve Goals: Good    Frequency 7X/week   Barriers to discharge        Co-evaluation               AM-PAC PT "6 Clicks" Mobility  Outcome Measure Help needed turning from your back to your side while in a flat bed without using bedrails?: A Little Help needed moving from lying on your back to sitting on the side of a flat bed without using bedrails?: A Little Help needed moving to and from a bed to a chair (including a wheelchair)?: A Little Help needed standing up from a chair using your arms (e.g., wheelchair or bedside chair)?: A Little Help needed to walk in hospital room?: A Lot Help needed climbing 3-5 steps with a railing? : A Lot 6 Click Score: 16    End of Session Equipment Utilized During Treatment: Gait belt Activity Tolerance: Patient limited by pain Patient left: in chair;with call bell/phone within reach Nurse Communication: Mobility status PT Visit Diagnosis: Unsteadiness on feet (R26.81);Muscle weakness (generalized) (M62.81);Difficulty in walking, not elsewhere classified (R26.2);Pain Pain - Right/Left: Left Pain - part of body: Knee    Time: 5681-2751 PT Time Calculation (min) (ACUTE ONLY): 27 min   Charges:   PT Evaluation $PT Eval Low Complexity: 1 Low PT Treatments $Therapeutic Activity: 8-22 mins        Leighton Ruff, PT, DPT  Acute Rehabilitation Services  Pager: [phone] Office: [phone]   Cheryl Prince 09/22/2018, 5:05 PM

## 2018-09-22 NOTE — Anesthesia Procedure Notes (Signed)
Anesthesia Regional Block: Adductor canal block   Pre-Anesthetic Checklist: ,, timeout performed, Correct Patient, Correct Site, Correct Laterality, Correct Procedure,, site marked, risks and benefits discussed, Surgical consent,  Pre-op evaluation,  At surgeon's request and post-op pain management  Laterality: Left  Prep: chloraprep       Needles:  Injection technique: Single-shot  Needle Type: Echogenic Stimulator Needle     Needle Length: 9cm  Needle Gauge: 21     Additional Needles:   Procedures:,,,, ultrasound used (permanent image in chart),,,,  Narrative:  Start time: 09/22/2018 7:10 AM End time: 09/22/2018 7:20 AM Injection made incrementally with aspirations every 5 mL.  Performed by: Personally  Anesthesiologist: Murvin Natal, MD  Additional Notes: Functioning IV was confirmed and monitors were applied. A time-out was performed. Hand hygiene and sterile gloves were used. The thigh was placed in a frog-leg position and prepped in a sterile fashion. A 11mm 21ga Arrow echogenic stimulator needle was placed using ultrasound guidance.  Negative aspiration and negative test dose prior to incremental administration of local anesthetic. The patient tolerated the procedure well.

## 2018-09-22 NOTE — Op Note (Signed)
NAME: Cheryl Prince, STRUVE [facility] MEDICAL RECORD OM:[id number] ACCOUNT [id number] DATE OF BIRTH:[birth year] FACILITY: [facility] LOCATION: [facility] PHYSICIAN:Matylda Fehring Randel Pigg, MD  OPERATIVE REPORT  DATE OF PROCEDURE:  09/22/2018  PREOPERATIVE DIAGNOSIS:  Left knee arthritis.  POSTOPERATIVE DIAGNOSIS:  Left knee arthritis.  PROCEDURE:  Left total knee replacement using Stryker press-fit components 3 femur, 4 tibia, 11 mm deep dish polyethylene insert PCL retaining with 35 mm press-fit 3-peg patella.  SURGEON:  Meredith Pel, MD  ASSISTANT:  Lurena Joiner Magnet.  INDICATIONS:  The patient is a 63 year old patient with severe end-stage left knee arthritis with failure of conservative management, who presents for operative management after explanation of risks and benefits.  PROCEDURE IN DETAIL:  The patient was brought to the operating room where general anesthetic was induced.  Preoperative antibiotics administered.  Timeout was called.  The left knee was pre-scrubbed with alcohol and Betadine, allowed to air dry.  Prep  with DuraPrep solution and draped in a sterile manner.  Ioban used to cover the operative field.  Timeout was called.  Leg was elevated examined with the Esmarch wrap.  Tourniquet was inflated to 300 mmHg for a total tourniquet time 64 minutes.  Anterior  approach to the knee was made.  Ioban used to cover the operative field.  The IrriSept solution was used throughout the case.  Median parapatellar approach was made and marked with #1 Vicryl suture.  Lateral patellofemoral ligament was released.  Fat  pad partially excised.  Soft tissue off the anterior distal femur was removed.  Osteophytes were removed.  Significant severe end-stage tricompartmental arthritis was present.  Medial soft tissue releases were performed due to the patient's significant  preoperative varus deformity. A cut was made on the tibial surface perpendicular to the mechanical axis of the tibia.  Collaterals and  posterior neurovascular structures were protected.  At this time, the cut was made, measured a distance 9 mm off the  least affected lateral tibial plateau.  This did give a good bone quality cut throughout the tibial plateau.  Attention was then directed towards the femur, which was cut in 5 degrees of valgus, 10 mm due to preoperative flexion contracture.  This  allowed both a 9 and 11 mm spacer to sit well with full extension.  At this time, the femur was sized to a size 3.  Anterior, posterior and chamfer cuts were made.  The tibia was then keel punched with good bone quality observed.  Trial components placed  and the patella was then prepared by cutting down from a 23 to a 13 mm.  Three peg 35 mm trial patellar button was placed.  With the trial components in position, the patient had full extension with an 11 mm spacer with good stability to varus and  valgus stress at 0 and 30 degrees.  Patella tracked well with no thumbs technique.  Trial components were removed.  The tibia was keel punched and final preparation was made.  Thorough irrigation with 3 liters of irrigating solution was performed.   IrriSept solution was utilized.  Marcaine, Exparel solution was injected into the capsule of the knee.  Tranexamic acid sponge was allowed to sit for 3 minutes.  This was removed and then the components were tapped into position with good press fit  achieved.  Same stability parameters were maintained.  PCL was preserved.  At this time, tourniquet was released.  Bleeding points encountered controlled using electrocautery.  Thorough irrigation again performed and the knee was then closed  over a  bolster using #1 Vicryl suture followed by interrupted inverted 0 Vicryl suture, 2-0 Vicryl suture and 3-0 Monocryl.  A solution of Marcaine and clonidine was injected into the knee at the end of the case.  Steri-Strips and Aquacel dressing applied.  The  patient was then placed with an Ace wrap, bulky dressing and  knee immobilizer.  She tolerated the procedure well without immediate complications.  Transferred to the recovery room in stable condition.  TN/NUANCE  D:09/22/2018 T:09/22/2018 JOB:[id number]

## 2018-09-22 NOTE — Transfer of Care (Signed)
Immediate Anesthesia Transfer of Care Note  Patient: Cheryl Prince  Procedure(s) Performed: LEFT TOTAL KNEE ARTHROPLASTY (Left Knee)  Patient Location: PACU  Anesthesia Type:General  Level of Consciousness: awake, alert  and oriented  Airway & Oxygen Therapy: Patient Spontanous Breathing and Patient connected to face mask oxygen  Post-op Assessment: Report given to RN and Post -op Vital signs reviewed and stable  Post vital signs: Reviewed and stable  Last Vitals:  Vitals Value Taken Time  BP 102/76 09/22/18 0954  Temp    Pulse 87 09/22/18 0955  Resp 16 09/22/18 0955  SpO2 100 % 09/22/18 0955  Vitals shown include unvalidated device data.  Last Pain:  Vitals:   09/22/18 0652  TempSrc: Oral  PainSc:          Complications: No apparent anesthesia complications

## 2018-09-22 NOTE — H&P (Addendum)
TOTAL KNEE ADMISSION H&P  Patient is being admitted for left total knee arthroplasty.  Subjective:  Chief Complaint:left knee pain.  HPI: Cheryl Prince, 63 y.o. female, has a history of pain and functional disability in the left knee due to arthritis and has failed non-surgical conservative treatments for greater than 12 weeks to includeNSAID's and/or analgesics, corticosteriod injections, viscosupplementation injections, use of assistive devices and activity modification.  Onset of symptoms was gradual, starting 8 years ago with gradually worsening course since that time. The patient noted no past surgery on the left knee(s).  Patient currently rates pain in the left knee(s) at 9 out of 10 with activity. Patient has night pain, worsening of pain with activity and weight bearing, pain that interferes with activities of daily living, pain with passive range of motion, crepitus and joint swelling.  Patient has evidence of subchondral cysts, subchondral sclerosis, periarticular osteophytes, joint subluxation and joint space narrowing by imaging studies. This patient has had Bilateral knee pain but it is worse on the left-hand side.. There is no active infection.  Patient request skilled nursing facility placement following surgery  Patient Active Problem List   Diagnosis Date Noted  . Alcohol use, unspecified with unspecified alcohol-induced disorder (HCC) 05/27/2018  . PTSD (post-traumatic stress disorder) 05/30/2016  . Alpha thalassemia trait 05/30/2016  . Hypertension 05/30/2016  . Rotator cuff tendonitis, left 05/30/2016  . Osteoarthritis of left knee 05/30/2016  . Carpal tunnel syndrome 05/30/2016  . Prediabetes 05/30/2016  . Onychomycosis 05/30/2016  . S/P tonsillectomy 05/30/2016  . Goiter, toxic diffuse 05/30/2016  . Chronic tonsillitis 05/30/2016   Past Medical History:  Diagnosis Date  . Anxiety   . Arthritis   . Asthma    borderline bronchitis  . Bronchitis   . GERD  (gastroesophageal reflux disease)   . Hypertension   . Sleep apnea    history of, MD tookpt. off machine 7 yrs, ago    Past Surgical History:  Procedure Laterality Date  . ABDOMINAL HYSTERECTOMY    . adnoides    . BREAST SURGERY    . CHOLECYSTECTOMY    . DILATION AND CURETTAGE OF UTERUS    . GASTRIC BYPASS  1998  . IUD REMOVAL    . TONSILLECTOMY      Current Facility-Administered Medications  Medication Dose Route Frequency Provider Last Rate Last Dose  . chlorhexidine (HIBICLENS) 4 % liquid 4 application  60 mL Topical Once Magnant, Charles L, PA-C      . chlorhexidine (HIBICLENS) 4 % liquid 4 application  60 mL Topical Once Magnant, Charles L, PA-C      . lactated ringers infusion   Intravenous Continuous Ellender, Catheryn Baconyan P, MD 10 mL/hr at 09/22/18 [phone]    . povidone-iodine 10 % swab 2 application  2 application Topical Once Magnant, Charles L, PA-C      . tranexamic acid (CYKLOKAPRON) 1000MG /16100mL IVPB           . tranexamic acid (CYKLOKAPRON) IVPB 1,000 mg  1,000 mg Intravenous To OR Cammy Copaean, Jahniyah Revere Scott, MD      . vancomycin (VANCOCIN) 1,500 mg in sodium chloride 0.9 % 500 mL IVPB  1,500 mg Intravenous On Call to OR Cammy Copaean, Ameah Chanda Scott, MD 250 mL/hr at 09/22/18 0630 1,500 mg at 09/22/18 0630   Allergies  Allergen Reactions  . Morphine And Related Swelling  . Penicillins Swelling  . Pine Shortness Of Breath  . Other Itching and Nausea And Vomiting    Mushrooms - feels high,  and dizzy   . Grapeseed Extract [Nutritional Supplements] Rash    grapes  . Latex Itching and Rash    "IF" condom, it makes it painful  . Red Dye Swelling and Rash    The dye for checking thyroid  . Sulfites Rash    Social History   Tobacco Use  . Smoking status: Former Smoker    Packs/day: 1.50    Years: 18.00    Pack years: 27.00    Types: Cigarettes    Quit date: 09/16/1984    Years since quitting: 34.0  . Smokeless tobacco: Never Used  . Tobacco comment: quit 1986  Substance Use Topics  .  Alcohol use: Yes    Comment: social    Family History  Problem Relation Age of Onset  . Hypertension Father   . Diabetes Father   . Lupus Daughter      Review of Systems  Musculoskeletal: Positive for joint pain.  All other systems reviewed and are negative.   Objective:  Physical Exam  Constitutional: She appears well-developed.  HENT:  Head: Normocephalic.  Eyes: Pupils are equal, round, and reactive to light.  Neck: Normal range of motion.  Cardiovascular: Normal rate.  Respiratory: Effort normal.  Neurological: She is alert.  Skin: Skin is warm.  Psychiatric: She has a normal mood and affect.  Ortho exam demonstrates varus alignment left knee with palpable pedal pulses.  Extensor mechanism is intact.  5 degree flexion contracture present.  Patient is able to bend past 90.  Collaterals are stable.  Vital signs in last 24 hours: Temp:  [98.4 F (36.9 C)] 98.4 F (36.9 C) (07/28 0652) Pulse Rate:  [73] 73 (07/28 0652) Resp:  [20] 20 (07/28 0652) BP: (118)/(75) 118/75 (07/28 0652) SpO2:  [100 %] 100 % (07/28 0652) Weight:  [101.9 kg] 101.9 kg (07/28 0614)  Labs:   Estimated body mass index is 38.55 kg/m as calculated from the following:   Height as of this encounter: 5\' 4"  (1.626 m).   Weight as of this encounter: 101.9 kg.   Imaging Review Plain radiographs demonstrate severe degenerative joint disease of the left knee(s). The overall alignment issignificant varus. The bone quality appears to be good for age and reported activity level.      Assessment/Plan:  End stage arthritis, left knee   The patient history, physical examination, clinical judgment of the provider and imaging studies are consistent with end stage degenerative joint disease of the left knee(s) and total knee arthroplasty is deemed medically necessary. The treatment options including medical management, injection therapy arthroscopy and arthroplasty were discussed at length. The risks and  benefits of total knee arthroplasty were presented and reviewed. The risks due to aseptic loosening, infection, stiffness, patella tracking problems, thromboembolic complications and other imponderables were discussed. The patient acknowledged the explanation, agreed to proceed with the plan and consent was signed. Patient is being admitted for inpatient treatment for surgery, pain control, PT, OT, prophylactic antibiotics, VTE prophylaxis, progressive ambulation and ADL's and discharge planning. The patient is planning to be discharged to skilled nursing facility.     Patient's anticipated LOS is less than 2 midnights, meeting these requirements: - Younger than 84 - Lives within 1 hour of care - Has a competent adult at home to recover with post-op recover - NO history of  - Chronic pain requiring opiods  - Diabetes  - Coronary Artery Disease  - Heart failure  - Heart attack  - Stroke  - DVT/VTE  -  Cardiac arrhythmia  - Respiratory Failure/COPD  - Renal failure  - Anemia  - Advanced Liver disease

## 2018-09-22 NOTE — Care Management CC44 (Signed)
Condition Code 44 Documentation Completed  Patient Details  Name: Cheryl Prince MRN: [id number] Date of Birth: [birth year]   Condition Code 44 given:  Yes Patient signature on Condition Code 44 notice:  (refused to sign stating it would mess everything up) Documentation of 2 MD's agreement:  Yes Code 44 added to claim:  Yes    Bartholomew Crews, RN 09/22/2018, 5:48 PM

## 2018-09-22 NOTE — Anesthesia Postprocedure Evaluation (Signed)
Anesthesia Post Note  Patient: Cheryl Prince  Procedure(s) Performed: LEFT TOTAL KNEE ARTHROPLASTY (Left Knee)     Patient location during evaluation: PACU Anesthesia Type: Regional and General Level of consciousness: awake and alert Pain management: pain level controlled Vital Signs Assessment: post-procedure vital signs reviewed and stable Respiratory status: spontaneous breathing, nonlabored ventilation, respiratory function stable and patient connected to nasal cannula oxygen Cardiovascular status: blood pressure returned to baseline and stable Postop Assessment: no apparent nausea or vomiting Anesthetic complications: no    Last Vitals:  Vitals:   09/22/18 1121 09/22/18 1606  BP: 115/78 103/77  Pulse: 87 98  Resp: 16 18  Temp: 36.7 C 36.7 C  SpO2: 100% 97%    Last Pain:  Vitals:   09/22/18 1606  TempSrc: Oral  PainSc:                  Ryan P Ellender

## 2018-09-23 ENCOUNTER — Encounter (HOSPITAL_COMMUNITY): Payer: Self-pay | Admitting: General Practice

## 2018-09-23 DIAGNOSIS — M1712 Unilateral primary osteoarthritis, left knee: Secondary | ICD-10-CM | POA: Diagnosis not present

## 2018-09-23 MED ORDER — PHENYLEPHRINE HCL-NACL 10-0.9 MG/250ML-% IV SOLN
INTRAVENOUS | Status: AC
  Filled 2018-09-23: qty 250

## 2018-09-23 NOTE — NC FL2 (Signed)
[place] LEVEL OF CARE SCREENING TOOL     IDENTIFICATION  Patient Name: Cheryl Prince Birthdate: 02-19-1956 Sex: female Admission Date (Current Location): 09/22/2018  [facility] and [place] Number:  Herbalist and Address:  [facility] [place] [address], [place], [place] [address]      Provider Number: [id number]  Attending Physician Name and Address:  Meredith Pel, MD  Relative Name and Phone Number:  [phone]    Current Level of Care: Hospital Recommended Level of Care: [place] Prior Approval Number:    Date Approved/Denied:   PASRR Number: [id number]  Discharge Plan: SNF    Current Diagnoses: Patient Active Problem List   Diagnosis Date Noted  . Arthritis of left knee 09/22/2018  . Alcohol use, unspecified with unspecified alcohol-induced disorder ([place]) 05/27/2018  . PTSD (post-traumatic stress disorder) 05/30/2016  . Alpha thalassemia trait 05/30/2016  . Hypertension 05/30/2016  . Rotator cuff tendonitis, left 05/30/2016  . Osteoarthritis of left knee 05/30/2016  . Carpal tunnel syndrome 05/30/2016  . Prediabetes 05/30/2016  . Onychomycosis 05/30/2016  . S/P tonsillectomy 05/30/2016  . Goiter, toxic diffuse 05/30/2016  . Chronic tonsillitis 05/30/2016    Orientation RESPIRATION BLADDER Height & Weight     Self, Time, Situation, Place  O2(2L nasal cannula) Continent Weight: 224 lb 9.6 oz (101.9 kg) Height:  5\' 4"  (162.6 cm)  BEHAVIORAL SYMPTOMS/MOOD NEUROLOGICAL BOWEL NUTRITION STATUS      Continent Diet(see discharge summary)  AMBULATORY STATUS COMMUNICATION OF NEEDS Skin   Extensive Assist Verbally Other (Comment)(left leg closed surgical incision)                       Personal Care Assistance Level of Assistance  Bathing, Feeding, Dressing, Total care Bathing Assistance: Limited assistance Feeding assistance: Independent Dressing Assistance: Limited  assistance Total Care Assistance: Maximum assistance   Functional Limitations Info  Sight, Hearing, Speech Sight Info: Adequate Hearing Info: Adequate Speech Info: Adequate    SPECIAL CARE FACTORS FREQUENCY  PT (By licensed PT), OT (By licensed OT)     PT Frequency: min 5x weekly OT Frequency: min 5x weekly            Contractures Contractures Info: Not present    Additional Factors Info  Code Status, Allergies Code Status Info: full Allergies Info: Morphine And Related Penicillins Pine Shellfish Allergy Other Grapeseed Extract (Nutritional Supplements) Latex Red Dye Sulfites           Current Medications (09/23/2018):  This is the current hospital active medication list Current Facility-Administered Medications  Medication Dose Route Frequency Provider Last Rate Last Dose  . acetaminophen (TYLENOL) tablet 325-650 mg  325-650 mg Oral Q6H PRN Magnant, Charles L, PA-C      . aspirin chewable tablet 81 mg  81 mg Oral BID Magnant, Charles L, PA-C   81 mg at 09/23/18 0945  . bupivacaine liposome (EXPAREL) 1.3 % injection 266 mg  20 mL Infiltration Once Meredith Pel, MD      . docusate sodium (COLACE) capsule 100 mg  100 mg Oral BID Magnant, Charles L, PA-C   100 mg at 09/23/18 0945  . HYDROmorphone (DILAUDID) injection 0.5-1 mg  0.5-1 mg Intravenous Q4H PRN Magnant, Charles L, PA-C      . methocarbamol (ROBAXIN) tablet 500 mg  500 mg Oral Q6H PRN Magnant, Charles L, PA-C   500 mg at 09/23/18 1232   Or  . methocarbamol (  ROBAXIN) 500 mg in dextrose 5 % 50 mL IVPB  500 mg Intravenous Q6H PRN Magnant, Charles L, PA-C      . metoCLOPramide (REGLAN) tablet 5-10 mg  5-10 mg Oral Q8H PRN Magnant, Charles L, PA-C       Or  . metoCLOPramide (REGLAN) injection 5-10 mg  5-10 mg Intravenous Q8H PRN Magnant, Charles L, PA-C      . naproxen (NAPROSYN) tablet 250 mg  250 mg Oral BID WC Magnant, Charles L, PA-C   250 mg at 09/23/18 0945  . ondansetron (ZOFRAN) tablet 4 mg  4 mg Oral Q6H  PRN Magnant, Charles L, PA-C       Or  . ondansetron (ZOFRAN) injection 4 mg  4 mg Intravenous Q6H PRN Magnant, Charles L, PA-C      . oxyCODONE (Oxy IR/ROXICODONE) immediate release tablet 10-15 mg  10-15 mg Oral Q4H PRN Magnant, Charles L, PA-C   10 mg at 09/23/18 1231  . oxyCODONE (Oxy IR/ROXICODONE) immediate release tablet 5-10 mg  5-10 mg Oral Q4H PRN Magnant, Charles L, PA-C   10 mg at 09/23/18 0559  . tranexamic acid (CYKLOKAPRON) 2,000 mg in sodium chloride 0.9 % 50 mL Topical Application  2,000 mg Topical Once Cammy Copaean, Gregory Scott, MD         Discharge Medications: Please see discharge summary for a list of discharge medications.  Relevant Imaging Results:  Relevant Lab Results:   Additional Information SSN: [id number]  Gildardo GriffesAshley M Ventura Leggitt, LCSW

## 2018-09-23 NOTE — Progress Notes (Signed)
  Subjective: Pt stable - has been oob to chair   Objective: Vital signs in last 24 hours: Temp:  [97 F (36.1 C)-98.7 F (37.1 C)] 98.6 F (37 C) (07/29 0536) Pulse Rate:  [80-98] 80 (07/29 0536) Resp:  [11-20] 20 (07/29 0536) BP: (102-140)/(68-103) 125/80 (07/29 0536) SpO2:  [93 %-100 %] 100 % (07/29 0536)  Intake/Output from previous day: 07/28 0701 - 07/29 0700 In: 5456 [P.O.:290; I.V.:1000] Out: 100 [Blood:100] Intake/Output this shift: No intake/output data recorded.  Exam:  Sensation intact distally Intact pulses distally Dorsiflexion/Plantar flexion intact  Labs: No results for input(s): HGB in the last 72 hours. No results for input(s): WBC, RBC, HCT, PLT in the last 72 hours. No results for input(s): NA, K, CL, CO2, BUN, CREATININE, GLUCOSE, CALCIUM in the last 72 hours. No results for input(s): LABPT, INR in the last 72 hours.  Assessment/Plan: Plan sw consult for snf PT and CPM for mobilization   G Alphonzo Severance 09/23/2018, 8:16 AM

## 2018-09-23 NOTE — Progress Notes (Signed)
  Subjective: Cheryl Prince is a 63 y.o. female s/p Left TKA.  They are POD1.  Pt's pain is tolerable and controlled by her pain medication regimen.  Pt denies weakness but notes some numbness and tingling medial and lateral to her incision.  Pt has ambulated with little difficulty to her in-room chair and to her bathroom.     Objective: Vital signs in last 24 hours: Temp:  [97.9 F (36.6 C)-98.7 F (37.1 C)] 98.6 F (37 C) (07/29 0536) Pulse Rate:  [80-98] 88 (07/29 0859) Resp:  [18-20] 18 (07/29 0859) BP: (103-135)/(77-90) 121/87 (07/29 0859) SpO2:  [97 %-100 %] 100 % (07/29 0859)  Intake/Output from previous day: 07/28 0701 - 07/29 0700 In: 6301 [P.O.:290; I.V.:1000] Out: 100 [Blood:100] Intake/Output this shift: Total I/O In: 720 [P.O.:720] Out: -   Exam:  No gross blood or drainage overlying the dressing 2+ DP pulse Sensation intact distally in the lateral Left foot.   Numbness to touch of the knee, medially and laterally Numbness of the medial mid foot Sensation intact of all 5 toes Able to dorsiflex and plantarflex the Left foot   Labs: No results for input(s): HGB in the last 72 hours. No results for input(s): WBC, RBC, HCT, PLT in the last 72 hours. No results for input(s): NA, K, CL, CO2, BUN, CREATININE, GLUCOSE, CALCIUM in the last 72 hours. No results for input(s): LABPT, INR in the last 72 hours.  Assessment/Plan: Pt is POD1 s/p left TKA.    -Plan to discharge to SNF today or tomorrow pending patient's pain and PT eval  -WBAT with a walker  -Okay to shower, dressing is waterproof.  Cautioned patient against soaking dressing in bath/pool/body of water  -Encouraged the use of the blue cradle boot to work on extension.  Cautioned patient against using a pillow under their knee.  -Use the CPM machine at least 3 times per day for one hour each time, increasing the degrees daily.     Cheryl Prince L Cheryl Prince 09/23/2018, 2:01 PM

## 2018-09-23 NOTE — Plan of Care (Signed)
  Problem: Education: Goal: Knowledge of the prescribed therapeutic regimen will improve Outcome: Progressing   Problem: Activity: Goal: Ability to avoid complications of mobility impairment will improve Outcome: Progressing Goal: Range of joint motion will improve Outcome: Progressing   Problem: Pain Management: Goal: Pain level will decrease with appropriate interventions Outcome: Progressing   Problem: Skin Integrity: Goal: Will show signs of wound healing Outcome: Progressing   

## 2018-09-23 NOTE — Progress Notes (Signed)
Physical Therapy Treatment Patient Details Name: Cheryl Prince Denise Arneson MRN: [id number] DOB: [birth year] Today's Date: 09/23/2018    History of Present Illness Pt is a 63 y/o female s/p L TKA. PMH includes asthma, HTN, and PTSD.     PT Comments    Pt making very slow progress with functional mobility. She remains very limited secondary to pain, weakness and fatigue. Pt continues to require min-mod A overall for mobility. She was unable to tolerate any true gait training, only a few steps forwards and laterally to transfer to the recliner chair. Pt would continue to benefit from skilled physical therapy services at this time while admitted and after d/c to address the below listed limitations in order to improve overall safety and independence with functional mobility.    Follow Up Recommendations  SNF     Equipment Recommendations  None recommended by PT    Recommendations for Other Services       Precautions / Restrictions Precautions Precautions: Knee Precaution Comments: Verbally reviewed knee precautions with pt.  Required Braces or Orthoses: Knee Immobilizer - Left Knee Immobilizer - Left: Other (comment)(pt insistent on wearing KI for mobility) Restrictions Weight Bearing Restrictions: Yes LLE Weight Bearing: Weight bearing as tolerated    Mobility  Bed Mobility Overal bed mobility: Needs Assistance Bed Mobility: Supine to Sit     Supine to sit: Min assist     General bed mobility comments: increased time and effort, assistance needed for L LE movement  Transfers Overall transfer level: Needs assistance Equipment used: Rolling walker (2 wheeled) Transfers: Sit to/from UGI CorporationStand;Stand Pivot Transfers Sit to Stand: Mod assist Stand pivot transfers: Min assist       General transfer comment: bed in elevated position, KI donned, assistance to power into standing and min A to take small forwards and side steps to chair  Ambulation/Gait             General  Gait Details: pt unable to tolerate   Stairs             Wheelchair Mobility    Modified Rankin (Stroke Patients Only)       Balance Overall balance assessment: Needs assistance Sitting-balance support: No upper extremity supported;Feet supported Sitting balance-Leahy Scale: Fair     Standing balance support: Bilateral upper extremity supported;During functional activity Standing balance-Leahy Scale: Poor Standing balance comment: heavy reliance on bilateral UEs                            Cognition Arousal/Alertness: Awake/alert Behavior During Therapy: WFL for tasks assessed/performed Overall Cognitive Status: Within Functional Limits for tasks assessed                                 General Comments: pt very particular throughout      Exercises Total Joint Exercises Heel Slides: AROM;Both;15 reps;Seated Knee Flexion: AROM;Both;15 reps;Seated Goniometric ROM: Extension: lacking ~10 degrees to neutral; Flexion: ~45 degrees actively    General Comments        Pertinent Vitals/Pain Pain Assessment: Faces Faces Pain Scale: Hurts even more Pain Location: L knee  Pain Descriptors / Indicators: Aching;Operative site guarding Pain Intervention(s): Monitored during session;Repositioned    Home Living                      Prior Function  PT Goals (current goals can now be found in the care plan section) Acute Rehab PT Goals PT Goal Formulation: With patient Time For Goal Achievement: 10/06/18 Potential to Achieve Goals: Good Progress towards PT goals: Progressing toward goals    Frequency    7X/week      PT Plan Current plan remains appropriate    Co-evaluation              AM-PAC PT "6 Clicks" Mobility   Outcome Measure  Help needed turning from your back to your side while in a flat bed without using bedrails?: A Little Help needed moving from lying on your back to sitting on the side of a  flat bed without using bedrails?: A Little Help needed moving to and from a bed to a chair (including a wheelchair)?: A Lot Help needed standing up from a chair using your arms (e.g., wheelchair or bedside chair)?: A Lot Help needed to walk in hospital room?: A Lot Help needed climbing 3-5 steps with a railing? : Total 6 Click Score: 13    End of Session Equipment Utilized During Treatment: Gait belt Activity Tolerance: Patient limited by pain Patient left: in chair;with call bell/phone within reach Nurse Communication: Mobility status PT Visit Diagnosis: Pain;Other abnormalities of gait and mobility (R26.89) Pain - Right/Left: Left Pain - part of body: Knee     Time: 0370-4888 PT Time Calculation (min) (ACUTE ONLY): 32 min  Charges:  $Therapeutic Activity: 23-37 mins                     Sherie Don, PT, DPT  Acute Rehabilitation Services Pager [phone] Office [place] 09/23/2018, 12:29 PM

## 2018-09-24 DIAGNOSIS — M1712 Unilateral primary osteoarthritis, left knee: Secondary | ICD-10-CM | POA: Diagnosis not present

## 2018-09-24 MED ORDER — DEXMEDETOMIDINE HCL IN NACL 80 MCG/20ML IV SOLN
INTRAVENOUS | Status: AC
  Filled 2018-09-24: qty 20

## 2018-09-24 MED ORDER — PHENYLEPHRINE HCL-NACL 10-0.9 MG/250ML-% IV SOLN
INTRAVENOUS | Status: AC
  Filled 2018-09-24: qty 750

## 2018-09-24 NOTE — Plan of Care (Signed)
  Problem: Activity: Goal: Ability to avoid complications of mobility impairment will improve Outcome: Progressing   Problem: Clinical Measurements: Goal: Postoperative complications will be avoided or minimized Outcome: Progressing   Problem: Pain Management: Goal: Pain level will decrease with appropriate interventions Outcome: Progressing   Problem: Skin Integrity: Goal: Will show signs of wound healing Outcome: Progressing   

## 2018-09-24 NOTE — Care Management (Addendum)
Case manager spoke with patientc oncerning choices for SNF. Conversation was via telephone. Patient says she wants to go to [facility], [facility] or [facility]. CM will fax out referrals.Sent FL2 at 3:57pm     Ricki Miller, RN BSN Case Manager [phone]

## 2018-09-24 NOTE — Progress Notes (Signed)
Physical Therapy Treatment Patient Details Name: Cheryl Prince MRN: [id number] DOB: 18-Aug-1955 Today's Date: 09/24/2018    History of Present Illness Pt is a 63 y/o female s/p L TKA. PMH includes asthma, HTN, and PTSD.     PT Comments    Pt presented supine in bed with HOB elevated, awake and willing to participate in therapy session. Pt making slow progress with mobility; however, was able to tolerate short distance ambulation this session. Pt would continue to benefit from skilled physical therapy services at this time while admitted and after d/c to address the below listed limitations in order to improve overall safety and independence with functional mobility.   Follow Up Recommendations  SNF     Equipment Recommendations  None recommended by PT    Recommendations for Other Services       Precautions / Restrictions Precautions Precautions: Knee Precaution Comments: Verbally reviewed knee precautions with pt.  Required Braces or Orthoses: Knee Immobilizer - Left Knee Immobilizer - Left: Other (comment)(pt insistent on wearing KI for mobility) Restrictions Weight Bearing Restrictions: Yes LLE Weight Bearing: Weight bearing as tolerated    Mobility  Bed Mobility Overal bed mobility: Needs Assistance Bed Mobility: Supine to Sit;Sit to Supine     Supine to sit: Min guard Sit to supine: Min guard   General bed mobility comments: increased time and effort, min guard for safety, no physical assistance required  Transfers Overall transfer level: Needs assistance Equipment used: Rolling walker (2 wheeled) Transfers: Sit to/from Stand Sit to Stand: From elevated surface;Min guard         General transfer comment: bed in elevated position, KI donned, cueing for safe hand placement and min guard for safety  Ambulation/Gait Ambulation/Gait assistance: Min guard Gait Distance (Feet): 30 Feet Assistive device: Rolling walker (2 wheeled) Gait  Pattern/deviations: Step-to pattern;Decreased step length - right;Decreased step length - left;Decreased stride length;Decreased weight shift to left;Antalgic Gait velocity: decreased   General Gait Details: pt with mild instability but no overt LOB or need for physical assistance, close min guard for safety; limited secondary to pain   Stairs             Wheelchair Mobility    Modified Rankin (Stroke Patients Only)       Balance Overall balance assessment: Needs assistance Sitting-balance support: No upper extremity supported;Feet supported Sitting balance-Leahy Scale: Fair     Standing balance support: Bilateral upper extremity supported;During functional activity Standing balance-Leahy Scale: Poor Standing balance comment: heavy reliance on bilateral UEs                            Cognition Arousal/Alertness: Awake/alert Behavior During Therapy: WFL for tasks assessed/performed Overall Cognitive Status: Within Functional Limits for tasks assessed                                        Exercises Total Joint Exercises Ankle Circles/Pumps: AROM;Both;20 reps;Seated Quad Sets: AROM;Strengthening;Left;10 reps;Supine Heel Slides: AAROM;Left;10 reps;Supine Long Arc Quad: AROM;Strengthening;Left;10 reps;Supine Goniometric ROM: Extension: lacking ~10 degrees to neutral; Flexion: ~45 degrees actively    General Comments        Pertinent Vitals/Pain Pain Assessment: Faces Faces Pain Scale: Hurts even more Pain Location: L knee  Pain Descriptors / Indicators: Aching;Operative site guarding Pain Intervention(s): Monitored during session;Repositioned    Home Living  Prior Function            PT Goals (current goals can now be found in the care plan section) Acute Rehab PT Goals PT Goal Formulation: With patient Time For Goal Achievement: 10/06/18 Potential to Achieve Goals: Good Progress towards PT goals:  Progressing toward goals    Frequency    7X/week      PT Plan Current plan remains appropriate    Co-evaluation              AM-PAC PT "6 Clicks" Mobility   Outcome Measure  Help needed turning from your back to your side while in a flat bed without using bedrails?: A Little Help needed moving from lying on your back to sitting on the side of a flat bed without using bedrails?: A Little Help needed moving to and from a bed to a chair (including a wheelchair)?: A Lot Help needed standing up from a chair using your arms (e.g., wheelchair or bedside chair)?: A Lot Help needed to walk in hospital room?: A Lot Help needed climbing 3-5 steps with a railing? : Total 6 Click Score: 13    End of Session Equipment Utilized During Treatment: Gait belt Activity Tolerance: Patient limited by pain Patient left: in bed;with call bell/phone within reach;in CPM Nurse Communication: Mobility status PT Visit Diagnosis: Pain;Other abnormalities of gait and mobility (R26.89) Pain - Right/Left: Left Pain - part of body: Knee     Time: 1450-1529 PT Time Calculation (min) (ACUTE ONLY): 39 min  Charges:  $Gait Training: 8-22 mins $Therapeutic Exercise: 8-22 mins $Therapeutic Activity: 8-22 mins                     Sherie Don, PT, DPT  Acute Rehabilitation Services Pager [phone] Office [place] 09/24/2018, 3:50 PM

## 2018-09-24 NOTE — Progress Notes (Signed)
Pt stable Knee moving well Plan dc to snf tomorrow

## 2018-09-25 ENCOUNTER — Telehealth: Payer: Self-pay

## 2018-09-25 DIAGNOSIS — M1712 Unilateral primary osteoarthritis, left knee: Secondary | ICD-10-CM | POA: Diagnosis not present

## 2018-09-25 LAB — SARS CORONAVIRUS 2 BY RT PCR (HOSPITAL ORDER, PERFORMED IN ~~LOC~~ HOSPITAL LAB): SARS Coronavirus 2: NEGATIVE

## 2018-09-25 MED ORDER — MAGNESIUM CITRATE PO SOLN
1.0000 | Freq: Once | ORAL | Status: AC
  Administered 2018-09-25: 1 via ORAL
  Filled 2018-09-25: qty 296

## 2018-09-25 MED ORDER — METHOCARBAMOL 500 MG PO TABS: 500.0000 mg | ORAL_TABLET | Freq: Three times a day (TID) | ORAL | 0 refills | Status: DC | PRN

## 2018-09-25 MED ORDER — ASPIRIN 81 MG PO CHEW: 81.0000 mg | CHEWABLE_TABLET | Freq: Two times a day (BID) | ORAL | 0 refills | Status: DC

## 2018-09-25 MED ORDER — OXYCODONE HCL 5 MG PO TABS: 5.0000 mg | ORAL_TABLET | Freq: Four times a day (QID) | ORAL | 0 refills | Status: DC | PRN

## 2018-09-25 NOTE — Plan of Care (Signed)
  Problem: Activity: Goal: Ability to avoid complications of mobility impairment will improve Outcome: Progressing   Problem: Clinical Measurements: Goal: Postoperative complications will be avoided or minimized Outcome: Progressing   Problem: Pain Management: Goal: Pain level will decrease with appropriate interventions Outcome: Progressing   Problem: Skin Integrity: Goal: Will show signs of wound healing Outcome: Progressing   

## 2018-09-25 NOTE — Telephone Encounter (Signed)
Advised Dr. Marlou Sa that patient needs a COVID-19 Rapid Test before she goes to [facility] per [place] on [place] at [place].

## 2018-09-25 NOTE — Progress Notes (Signed)
  Subjective: Cheryl Prince is a 63 y.o. female s/p L TKA.  They are POD3.  Pt's pain is improving and well-controlled.  Pt denies weakness but notes numbness/tingling lateral to her incision.  The previous numbness she had medial to her incision has now resolved.  Pt has ambulated with little difficulty to her chair and her bathroom.     Objective: Vital signs in last 24 hours: Temp:  [98.2 F (36.8 C)-99.1 F (37.3 C)] 98.2 F (36.8 C) (07/31 0820) Pulse Rate:  [87-90] 90 (07/31 0820) Resp:  [18-20] 20 (07/31 0820) BP: (104-134)/(70-87) 104/70 (07/31 0820) SpO2:  [96 %-98 %] 96 % (07/31 0820)  Intake/Output from previous day: 07/30 0701 - 07/31 0700 In: 720 [P.O.:720] Out: -  Intake/Output this shift: Total I/O In: 360 [P.O.:360] Out: -   Exam:  No gross blood or drainage overlying the dressing 2+ DP pulse Sensation intact distally in the left foot Able to dorsiflex and plantarflex the left foot   Labs: No results for input(s): HGB in the last 72 hours. No results for input(s): WBC, RBC, HCT, PLT in the last 72 hours. No results for input(s): NA, K, CL, CO2, BUN, CREATININE, GLUCOSE, CALCIUM in the last 72 hours. No results for input(s): LABPT, INR in the last 72 hours.  Assessment/Plan: Pt is POD3 s/p left TKA.    -Plan to discharge to SNF today or tomorrow pending Covid test results, per SNF  -WBAT with a walker  -Okay to shower, dressing is waterproof.  Cautioned patient against soaking dressing in bath/pool/body of water  -Encouraged the use of the blue cradle boot to work on extension.  Cautioned patient against using a pillow under their knee.  -Use the CPM machine at least 3 times per day for one hour each time, increasing the degrees daily.     [name] [name] 09/25/2018, 2:59 PM

## 2018-09-25 NOTE — Care Management (Addendum)
Case manager spoke with Cheryl Prince from [facility], they have accepted patient and this is her first choice. Waiting for Insurance from [facility]. Marland Kitchenneed COVID test before she can transfer.

## 2018-09-25 NOTE — Progress Notes (Signed)
Pt c/o constipation despite colace.  Dr. Marlou Sa was notified and order for mag citrate obtained.

## 2018-09-25 NOTE — Progress Notes (Signed)
PT Cancellation Note  Patient Details Name: Cheryl Prince MRN: [id number] DOB: 06/18/1955   Cancelled Treatment:    Reason Eval/Treat Not Completed: Patient declined, no reason specified. Pt was just placed in CPM prior to PT arrival. Pt declining therapy session at this time as she wanted to use CPM and had just recently ambulated to and from bathroom with nurse tech. PT will continue to follow acutely as available. Plan is for pt to d/c to SNF today.   [place] 09/25/2018, 2:45 PM

## 2018-09-26 DIAGNOSIS — M1712 Unilateral primary osteoarthritis, left knee: Secondary | ICD-10-CM | POA: Diagnosis not present

## 2018-09-26 LAB — NOVEL CORONAVIRUS, NAA (HOSP ORDER, SEND-OUT TO REF LAB; TAT 18-24 HRS): SARS-CoV-2, NAA: NOT DETECTED

## 2018-09-26 NOTE — Plan of Care (Signed)
?  Problem: Education: ?Goal: Knowledge of the prescribed therapeutic regimen will improve ?Outcome: Progressing ?  ?Problem: Activity: ?Goal: Ability to avoid complications of mobility impairment will improve ?Outcome: Progressing ?  ?Problem: Pain Management: ?Goal: Pain level will decrease with appropriate interventions ?Outcome: Progressing ?  ?Problem: Skin Integrity: ?Goal: Will show signs of wound healing ?Outcome: Progressing ?  ?

## 2018-09-26 NOTE — TOC Transition Note (Signed)
Transition of Care [facility]) - CM/SW Discharge Note   Patient Details  Name: Cheryl Prince MRN: [id number] Date of Birth: 13-Aug-1955  [facility] CM/SW Contact:  Bary Castilla, LCSW Phone Number:336 [phone] 09/26/2018, 12:24 PM   Clinical Narrative:    Patient will DC to: [facility]? Anticipated DC date:?09/26/18. Family notified:spoke with patient-daughter phone not working.? Transport TK:PTWS   Per MD patient ready for DC to [facility] RN, patient, patient's family, and facility notified of DC. Discharge Summary sent to facility. RN given number for report [phone]. DC packet on chart. Ambulance transport requested for patient.  CSW signing off.   [place], [place] [phone]    Final next level of care: Skilled Nursing Facility Barriers to Discharge: No Barriers Identified   Patient Goals and CMS Choice Patient states their goals for this hospitalization and ongoing recovery are:: get better CMS Medicare.gov Compare Post Acute Care list provided to:: Patient(choice offered verbally via telephone) Choice offered to / list presented to : Patient  Discharge Placement              Patient [facility] at: [facility] Patient to be transferred to facility by: [facility] Name of family member notified: Daughter Patient and family notified of of transfer: 09/26/18  Discharge Plan and Services   Discharge Planning Services: CM Consult Post Acute Care Choice: [facility]          DME Arranged: N/A         HH Arranged: NA          Social Determinants of Health (SDOH) Interventions     Readmission Risk Interventions No flowsheet data found.

## 2018-09-26 NOTE — Progress Notes (Signed)
  Subjective: Cheryl Prince is a 63 y.o. female s/p Left TKA.  They are POD4.  Pt's pain is well-controlled.  Pt denies numbness/tingling/weakness aside from some numbness lateral to her incision.  Pt has ambulated with some difficulty. She is still only ambulating to her chair and to her bathroom, but she is able to bear weight on the leg for extended periods of time as she brushes her teeth.  Pt was given Magnesium Citrate for constipation last night but she still has not had a BM.      Objective: Vital signs in last 24 hours: Temp:  [98.2 F (36.8 C)-98.6 F (37 C)] 98.6 F (37 C) (08/01 0735) Pulse Rate:  [81-91] 81 (08/01 0735) Resp:  [18] 18 (08/01 0735) BP: (127-129)/(77-83) 127/83 (08/01 0735) SpO2:  [99 %-100 %] 100 % (08/01 0735)  Intake/Output from previous day: 07/31 0701 - 08/01 0700 In: 720 [P.O.:720] Out: -  Intake/Output this shift: No intake/output data recorded.  Exam:  No gross blood or drainage overlying the dressing 2+ DP pulse Sensation intact distally in the left foot Able to dorsiflex and plantarflex the left foot   Labs: No results for input(s): HGB in the last 72 hours. No results for input(s): WBC, RBC, HCT, PLT in the last 72 hours. No results for input(s): NA, K, CL, CO2, BUN, CREATININE, GLUCOSE, CALCIUM in the last 72 hours. No results for input(s): LABPT, INR in the last 72 hours.  Assessment/Plan: Pt is POD4 s/p L TKA.    -Plan to discharge to SNF today.  COVID test negative  -Discussed options for constipation with nursing team.  Next step is an enema.  Recommended that she receive an enema at her SNF if she has not had a BM by then, due to her impending discharge  -WBAT with a walker  -Okay to shower, dressing is waterproof.  Cautioned patient against soaking dressing in bath/pool/body of water  -Encouraged the use of the blue cradle boot to work on extension.  Cautioned patient against using a pillow under their knee.  -Use the  CPM machine at least 3 times per day for one hour each time, increasing the degrees daily.     Eldene Plocher L Nyelli Samara 09/26/2018, 9:02 AM

## 2018-09-26 NOTE — Progress Notes (Signed)
Called facility and gave report to nurse Jody, all questions and concerns addressed, Pt not in distress, to discharge to facility with belongings via [place].

## 2018-09-26 NOTE — Discharge Summary (Signed)
Physician Discharge Summary      Patient ID: Cheryl Prince MRN: [id number] DOB/AGE: 1955-10-06 63 y.o.  Admit date: 09/22/2018 Discharge date: 09/26/2018  Admission Diagnoses:  Active Problems:   Arthritis of left knee   Discharge Diagnoses:  Same  Surgeries: Procedure(s): LEFT TOTAL KNEE ARTHROPLASTY on 09/22/2018   Consultants:   Discharged Condition: Stable  Hospital Course: Cheryl Prince is an 63 y.o. female who was admitted 09/22/2018 with a chief complaint of left knee pain, and found to have a diagnosis of left knee osteoarthritis.  They were brought to the operating room on 09/22/2018 and underwent the above named procedures.  Pt tolerated the procedure well and woke from anesthesia without event.  On POD1, patient had numbness and tingling throughout her leg that improved by POD4 to just one area of numbness lateral to her incision.  Her pain was well-controlled throughout her stay in the hospital.  She initially was not comfortable ambulating but by POD4, she was able to consistently ambulate from the bed to her chair and to her bathroom, standing for short periods of time. She struggled with constipation that was treated with colace and mag citrate without relief; patient will have enema at SNF if no BM by then.On POD4, she was discharged to SNF and will f/u with Dr. August Saucerean in clinic in ~2 weeks.    Antibiotics given:  Anti-infectives (From admission, onward)   Start     Dose/Rate Route Frequency Ordered Stop   09/22/18 1830  vancomycin (VANCOCIN) IVPB 1000 mg/200 mL premix     1,000 mg 200 mL/hr over 60 Minutes Intravenous Every 12 hours 09/22/18 1443 09/23/18 0700   09/22/18 0600  vancomycin (VANCOCIN) 1,500 mg in sodium chloride 0.9 % 500 mL IVPB     1,500 mg 250 mL/hr over 120 Minutes Intravenous On call to O.R. 09/21/18 [id number] 09/22/18 0753    .  Recent vital signs:  Vitals:   09/26/18 0508 09/26/18 0735  BP: 129/78 127/83  Pulse: 83 81  Resp: 18  18  Temp: 98.2 F (36.8 C) 98.6 F (37 C)  SpO2: 99% 100%    Recent laboratory studies:  Results for orders placed or performed during the hospital encounter of 09/22/18  SARS Coronavirus 2 [facility](Hospital order, Performed in [facility] hospital lab) Nasopharyngeal Nasopharyngeal Swab   Specimen: Nasopharyngeal Swab  Result Value Ref Range   SARS Coronavirus 2 NEGATIVE NEGATIVE    Discharge Medications:   Allergies as of 09/26/2018      Reactions   Morphine And Related Swelling   Penicillins Swelling   Pine Shortness Of Breath   Shellfish Allergy Shortness Of Breath   Other Itching, Nausea And Vomiting   Mushrooms - feels high, and dizzy   Grapeseed Extract [nutritional Supplements] Rash   grapes   Latex Itching, Rash   "IF" condom, it makes it painful   Red Dye Swelling, Rash   The dye for checking thyroid   Sulfites Rash      Medication List    STOP taking these medications   acetaminophen 650 MG CR tablet Commonly known as: TYLENOL   cyclobenzaprine 10 MG tablet Commonly known as: FLEXERIL     TAKE these medications   albuterol 108 (90 Base) MCG/ACT inhaler Commonly known as: ProAir HFA Inhale 2 puffs into the lungs every 6 (six) hours as needed for wheezing or shortness of breath. Notes to patient: Resume home regimen   amLODIPine-Valsartan-HCTZ 10-160-12.5 MG Tabs Take 1 tablet by  mouth daily. Notes to patient: Resume home regimen   aspirin 81 MG chewable tablet Chew 1 tablet (81 mg total) by mouth 2 (two) times daily.   cetirizine 10 MG tablet Commonly known as: ZyrTEC Allergy Take 1 tablet (10 mg total) by mouth daily. What changed:   when to take this  reasons to take this Notes to patient: Resume home regimen   famotidine 20 MG tablet Commonly known as: Pepcid Take 1 tablet (20 mg total) by mouth 2 (two) times daily. What changed:   when to take this  reasons to take this Notes to patient: Resume home regimen   ferrous sulfate 325 (65 FE)  MG tablet Take 1 tablet (325 mg total) by mouth daily with breakfast. Notes to patient: Resume home regimen   Flovent HFA 110 MCG/ACT inhaler Generic drug: fluticasone Inhale 2 puffs into the lungs 2 (two) times daily as needed for up to 30 days. Notes to patient: Resume home regimen   gabapentin 300 MG capsule Commonly known as: NEURONTIN Take 1 capsule (300 mg total) by mouth 3 (three) times daily. Notes to patient: Resume home regimen   lamoTRIgine 25 MG tablet Commonly known as: LAMICTAL TAKE 2 TABLETS BY MOUTH  DAILY   methocarbamol 500 MG tablet Commonly known as: ROBAXIN Take 1 tablet (500 mg total) by mouth every 8 (eight) hours as needed for muscle spasms.   montelukast 10 MG tablet Commonly known as: SINGULAIR Take 10 mg by mouth daily as needed (wheezing). Notes to patient: Resume home regimen   oxyCODONE 5 MG immediate release tablet Commonly known as: Oxy IR/ROXICODONE Take 1 tablet (5 mg total) by mouth every 6 (six) hours as needed for moderate pain (pain score 4-6).   prochlorperazine 5 MG tablet Commonly known as: COMPAZINE Take 1 tablet (5 mg total) by mouth every 6 (six) hours as needed for nausea or vomiting. Notes to patient: Resume home regimen       Diagnostic Studies: Xr Knee 1-2 Views Left  Result Date: 09/04/2018 AP lateral left knee reviewed.  Varus alignment is present.  End-stage tricompartmental arthritis is present worse in the medial compartment.  No acute fracture or dislocation.  AP lateral left knee reviewed.Patient seen and examined. .   Disposition: Discharge disposition: 03-Skilled Nursing Facility       Discharge Instructions    Call MD / Call 911   Complete by: As directed    If you experience chest pain or shortness of breath, CALL 911 and be transported to the hospital emergency room.  If you develope a fever above 101 F, pus (white drainage) or increased drainage or redness at the wound, or calf pain, call your surgeon's  office.   Constipation Prevention   Complete by: As directed    Drink plenty of fluids.  Prune juice may be helpful.  You may use a stool softener, such as Colace (over the counter) 100 mg twice a day.  Use MiraLax (over the counter) for constipation as needed.   Diet - low sodium heart healthy   Complete by: As directed    Discharge instructions   Complete by: As directed    Okay to shower, dressing is waterproof. Do not soak the knee in a pool or bath. Weight bearing as tolerated on the R knee Use the CPM machine 3 times per day for at least one hour each time Use the blue cradle boot to work on getting the leg straight Do not put a pillow  under your knee   Increase activity slowly as tolerated   Complete by: As directed          Signed: Julieanne Cottonharles L Darothy Courtright 09/26/2018, 11:07 AM

## 2018-09-26 NOTE — Progress Notes (Signed)
Physical Therapy Treatment Patient Details Name: Cheryl Prince MRN: [id number] DOB: 09-11-1955 Today's Date: 09/26/2018    History of Present Illness Pt is a 63 y/o female s/p L TKA. PMH includes asthma, HTN, and PTSD.     PT Comments    Patient seen for mobility progression. Continue to progress as tolerated with anticipated d/c to SNF for further skilled PT services.     Follow Up Recommendations  SNF     Equipment Recommendations  None recommended by PT    Recommendations for Other Services       Precautions / Restrictions Precautions Precautions: Knee Precaution Booklet Issued: No Precaution Comments: Verbally reviewed knee precautions with pt.  Restrictions Weight Bearing Restrictions: Yes LLE Weight Bearing: Weight bearing as tolerated    Mobility  Bed Mobility               General bed mobility comments: pt OOB in chair upon arrival  Transfers Overall transfer level: Needs assistance Equipment used: Rolling walker (2 wheeled) Transfers: Sit to/from Stand Sit to Stand: Min assist         General transfer comment: assist to steady from recliner; cues for safe hand placement  Ambulation/Gait Ambulation/Gait assistance: Min guard Gait Distance (Feet): 50 Feet Assistive device: Rolling walker (2 wheeled) Gait Pattern/deviations: Decreased step length - left;Decreased stride length;Decreased weight shift to left;Antalgic;Step-through pattern;Decreased step length - right Gait velocity: decreased   General Gait Details: cues for increased bilat step lengths and elevated R step height as pt tends to shuffle foot for decreased L LE weight bearing    Stairs             Wheelchair Mobility    Modified Rankin (Stroke Patients Only)       Balance Overall balance assessment: Needs assistance Sitting-balance support: No upper extremity supported;Feet supported Sitting balance-Leahy Scale: Fair     Standing balance support: Bilateral  upper extremity supported;During functional activity Standing balance-Leahy Scale: Poor Standing balance comment: heavy reliance on bilateral UEs                            Cognition Arousal/Alertness: Awake/alert Behavior During Therapy: WFL for tasks assessed/performed Overall Cognitive Status: Within Functional Limits for tasks assessed                                 General Comments: pt very particular throughout      Exercises Total Joint Exercises Ankle Circles/Pumps: AROM;Both;Seated Long Arc Quad: AROM;Strengthening;Left;10 reps;Seated Knee Flexion: AROM;Seated;Left;5 reps;Other (comment)(5-10 second holds)    General Comments        Pertinent Vitals/Pain Pain Assessment: Faces Faces Pain Scale: Hurts little more Pain Location: L knee  Pain Descriptors / Indicators: Guarding;Sore Pain Intervention(s): Limited activity within patient's tolerance;Monitored during session;Repositioned    Home Living                      Prior Function            PT Goals (current goals can now be found in the care plan section) Acute Rehab PT Goals Patient Stated Goal: to decrease pain  Progress towards PT goals: Progressing toward goals    Frequency    7X/week      PT Plan Current plan remains appropriate    Co-evaluation  AM-PAC PT "6 Clicks" Mobility   Outcome Measure  Help needed turning from your back to your side while in a flat bed without using bedrails?: A Little Help needed moving from lying on your back to sitting on the side of a flat bed without using bedrails?: A Little Help needed moving to and from a bed to a chair (including a wheelchair)?: A Little Help needed standing up from a chair using your arms (e.g., wheelchair or bedside chair)?: A Little Help needed to walk in hospital room?: A Little Help needed climbing 3-5 steps with a railing? : Total 6 Click Score: 16    End of Session Equipment  Utilized During Treatment: Gait belt Activity Tolerance: Patient tolerated treatment well Patient left: with call bell/phone within reach;in chair Nurse Communication: Mobility status PT Visit Diagnosis: Pain;Other abnormalities of gait and mobility (R26.89) Pain - Right/Left: Left Pain - part of body: Knee     Time: 1030-1052 PT Time Calculation (min) (ACUTE ONLY): 22 min  Charges:  $Gait Training: 8-22 mins                     Earney Navy, PTA Acute Rehabilitation Services Pager: [phone] Office: [phone]     Darliss Cheney 09/26/2018, 1:28 PM

## 2018-09-26 NOTE — Plan of Care (Signed)
  Problem: Activity: Goal: Ability to avoid complications of mobility impairment will improve Outcome: Progressing   Problem: Clinical Measurements: Goal: Postoperative complications will be avoided or minimized Outcome: Progressing   Problem: Pain Management: Goal: Pain level will decrease with appropriate interventions Outcome: Progressing   Problem: Skin Integrity: Goal: Will show signs of wound healing Outcome: Progressing   

## 2018-10-01 ENCOUNTER — Ambulatory Visit: Payer: Medicare Other | Admitting: Gastroenterology

## 2018-10-06 ENCOUNTER — Ambulatory Visit (INDEPENDENT_AMBULATORY_CARE_PROVIDER_SITE_OTHER): Payer: Medicare Other | Admitting: Primary Care

## 2018-10-07 ENCOUNTER — Encounter: Payer: Self-pay | Admitting: Orthopedic Surgery

## 2018-10-07 ENCOUNTER — Ambulatory Visit (INDEPENDENT_AMBULATORY_CARE_PROVIDER_SITE_OTHER): Payer: Medicare Other

## 2018-10-07 ENCOUNTER — Ambulatory Visit (INDEPENDENT_AMBULATORY_CARE_PROVIDER_SITE_OTHER): Payer: Medicare Other | Admitting: Orthopedic Surgery

## 2018-10-07 DIAGNOSIS — Z96652 Presence of left artificial knee joint: Secondary | ICD-10-CM

## 2018-10-07 MED ORDER — OXYCODONE HCL 5 MG PO TABS: 5.0000 mg | ORAL_TABLET | Freq: Three times a day (TID) | ORAL | 0 refills | Status: DC

## 2018-10-07 MED ORDER — ACETAMINOPHEN 325 MG PO TABS: 325.0000 mg | ORAL_TABLET | Freq: Three times a day (TID) | ORAL | 0 refills | Status: DC

## 2018-10-07 NOTE — Progress Notes (Signed)
Post-Op Visit Note   Patient: Cheryl Prince           Date of Birth: 1955-06-14           MRN: [id number] Visit Date: 10/07/2018 PCP: Cain SaupeFulp, Cammie, MD   Assessment & Plan:  Chief Complaint:  Chief Complaint  Patient presents with  . Left Knee - Routine Post Op   Visit Diagnoses:  1. Status post total left knee replacement     Plan: Patient is a 63 year old female who presents s/p left total knee arthroplasty on 09/22/18.  Patient has been staying at a SNF and states she is doing well.  She is able to reach 0 degrees of extension and has been up to 92 degrees on the CPM machine.  Her incision looks good today she denies any fever/chills/drainage.  She is ambulating with a walker.  Her x-rays taken today reveal a well-positioned implant with no evidence of loosening or fracture.  She is only using her oxycodone before she gets on the CPM machine.  She is also taking gabapentin which she states helps her more.  Patient states the only thing she has left to do at the SNF is to learn how to navigate stairs before she can be discharged.  Patient is doing well, plan for patient to return home either Saturday or Monday.  To receive home health PT 3 times a week for 3 weeks.  Patient will return to the office in 4 weeks.  Follow-Up Instructions: No follow-ups on file.   Orders:  Orders Placed This Encounter  Procedures  . XR Knee 1-2 Views Left   No orders of the defined types were placed in this encounter.   Imaging: No results found.  PMFS History: Patient Active Problem List   Diagnosis Date Noted  . Arthritis of left knee 09/22/2018  . Alcohol use, unspecified with unspecified alcohol-induced disorder (HCC) 05/27/2018  . PTSD (post-traumatic stress disorder) 05/30/2016  . Alpha thalassemia trait 05/30/2016  . Hypertension 05/30/2016  . Rotator cuff tendonitis, left 05/30/2016  . Osteoarthritis of left knee 05/30/2016  . Carpal tunnel syndrome 05/30/2016  .  Prediabetes 05/30/2016  . Onychomycosis 05/30/2016  . S/P tonsillectomy 05/30/2016  . Goiter, toxic diffuse 05/30/2016  . Chronic tonsillitis 05/30/2016   Past Medical History:  Diagnosis Date  . Anxiety   . Arthritis   . Asthma    borderline bronchitis  . Bronchitis   . GERD (gastroesophageal reflux disease)   . Hypertension   . Sleep apnea    history of, MD tookpt. off machine 7 yrs, ago    Family History  Problem Relation Age of Onset  . Hypertension Father   . Diabetes Father   . Lupus Daughter     Past Surgical History:  Procedure Laterality Date  . ABDOMINAL HYSTERECTOMY    . adnoides    . BREAST SURGERY    . CHOLECYSTECTOMY    . DILATION AND CURETTAGE OF UTERUS    . GASTRIC BYPASS  1998  . IUD REMOVAL    . TONSILLECTOMY    . TOTAL KNEE ARTHROPLASTY Left 09/22/2018  . TOTAL KNEE ARTHROPLASTY Left 09/22/2018   Procedure: LEFT TOTAL KNEE ARTHROPLASTY;  Surgeon: Cammy Copaean, Gregory Scott, MD;  Location[facility] OR;  Service: Orthopedics;  Laterality: Left;   Social History   Occupational History  . Not on file  Tobacco Use  . Smoking status: Former Smoker    Packs/day: 1.50    Years: 18.00  Pack years: 27.00    Types: Cigarettes    Quit date: 09/16/1984    Years since quitting: 34.0  . Smokeless tobacco: Never Used  . Tobacco comment: quit 1986  Substance and Sexual Activity  . Alcohol use: Yes    Comment: social  . Drug use: No  . Sexual activity: Not on file

## 2018-10-08 ENCOUNTER — Telehealth: Payer: Self-pay | Admitting: Orthopedic Surgery

## 2018-10-08 NOTE — Telephone Encounter (Signed)
Received call from [employer] worker with [facility] advised patient is discharging from the facility Sunday. Amber said patient has a CPM machine and needed to know if patient need to be sent  Home with it? Amber asked if she can be called back today.  The number to contact Amber is [phone] Ext: [phone]

## 2018-10-08 NOTE — Telephone Encounter (Signed)
IC advised per Dr Marlou Sa.The one patient is currently using is rented from facility. Patient will be d/c on Sunday. Can you see if Ruby [employer] can get her one set up at her home sometime on Monday?

## 2018-10-08 NOTE — Telephone Encounter (Signed)
Yes needs cpm at home from someone for 2 weeks

## 2018-10-08 NOTE — Telephone Encounter (Signed)
Please advise. Thanks.  

## 2018-10-09 ENCOUNTER — Encounter: Payer: Self-pay | Admitting: Orthopedic Surgery

## 2018-10-12 ENCOUNTER — Telehealth: Payer: Self-pay | Admitting: Orthopedic Surgery

## 2018-10-12 ENCOUNTER — Other Ambulatory Visit: Payer: Self-pay | Admitting: Surgical

## 2018-10-12 NOTE — Telephone Encounter (Signed)
IC verbal given.  

## 2018-10-12 NOTE — Telephone Encounter (Signed)
Can you please submit to local pharmacy listed I patients chart, no [facility] rx and send back to me once complete.

## 2018-10-12 NOTE — Telephone Encounter (Signed)
Pt called in said he was suppose to have oxycodone sent into her pharmacy, that her appt on 10-07-2018 dr.dean said he was going to send it in but pharmacy has not received it. Pt also needs to know if she still needs to take asprin.   [phone]

## 2018-10-12 NOTE — Telephone Encounter (Signed)
Cheryl with [facility] called requesting VO for 1x a week for 2 weeks for Cardio/Pulmonary assessment.  OT will be evaluating the patient the week of 10/26/18.  Patient does not have a home health aid to help her with showers due to PT advising that it is not safe for the patient to shower right now.  CB#[phone].  Thank you.

## 2018-10-13 ENCOUNTER — Other Ambulatory Visit: Payer: Self-pay | Admitting: Surgical

## 2018-10-13 MED ORDER — OXYCODONE HCL 5 MG PO TABS: 5.0000 mg | ORAL_TABLET | Freq: Three times a day (TID) | ORAL | 0 refills | Status: DC

## 2018-10-13 NOTE — Telephone Encounter (Signed)
I called patient and advised, script has been picked up. She also asked if she should continue aspirin, I advised yes. Also questioned when she should take muscle relaxer, I advised only as needed for muscle spasms.

## 2018-10-14 ENCOUNTER — Other Ambulatory Visit: Payer: Self-pay | Admitting: Family Medicine

## 2018-10-19 ENCOUNTER — Telehealth: Payer: Self-pay | Admitting: Orthopedic Surgery

## 2018-10-19 NOTE — Telephone Encounter (Signed)
Liji from [facility] called to request VO for [facility] PT for 1x a week for 1 week, 4x a week for 2 weeks, and 2x a week for 1 week.  CB#[phone].  Thank you.

## 2018-10-19 NOTE — Telephone Encounter (Signed)
IC verbal given.  

## 2018-10-22 ENCOUNTER — Telehealth: Payer: Self-pay | Admitting: Orthopedic Surgery

## 2018-10-22 NOTE — Telephone Encounter (Signed)
Larose Hires, OT, with [facility] home [facility] called to request VO for the following:  1x a week for 1 week 2x a week for 4 weeks 1x a week for 2 weeks.  CB#[phone].  Thank you.

## 2018-10-22 NOTE — Telephone Encounter (Signed)
IC verbal given.  

## 2018-10-26 ENCOUNTER — Inpatient Hospital Stay: Payer: Medicare Other | Admitting: Nurse Practitioner

## 2018-10-28 ENCOUNTER — Telehealth: Payer: Self-pay

## 2018-10-28 ENCOUNTER — Ambulatory Visit: Payer: Medicare Other | Attending: Nurse Practitioner | Admitting: Pharmacist

## 2018-10-28 ENCOUNTER — Ambulatory Visit: Payer: Medicare Other | Admitting: Family Medicine

## 2018-10-28 ENCOUNTER — Other Ambulatory Visit: Payer: Self-pay

## 2018-10-28 VITALS — BP 115/81 | HR 81

## 2018-10-28 DIAGNOSIS — I10 Essential (primary) hypertension: Secondary | ICD-10-CM | POA: Diagnosis not present

## 2018-10-28 NOTE — Progress Notes (Signed)
   S:    PCP: Dr. Chapman Fitch  Patient arrives in good spirits. Presents to the clinic for hypertension evaluation, counseling, and management.  Patient was referred and last seen by Primary Care Provider on 09/02/18.   Patient reports adherence with medications. She reports that her orthopedic physician stopped her Exforge-HCT and placed her on HCTZ 25 mg only. I am unable to verify this in her records.   Current BP Medications include:   - per pt, HCTZ 25 mg daily  Antihypertensives tried in the past include:  - Exforge-HCT  Dietary habits include:  - Compliant with salt restriction - Denies drinking caffeine  Exercise habits include: - Limited d/t recent knee replacement  - Spends ~5 minutes daily on the treadmill  Family / Social history:  - Tobacco: former smoker (quit in 7s) - Alcohol: denies use since ~3 wks prior to knee replacement   O:  L arm after 5 minutes rest: 115/81  Home BP readings:  - Checked by home PT: gives range 120s-80s  Last 3 Office BP readings: BP Readings from Last 3 Encounters:  10/28/18 115/81  09/26/18 124/87  09/17/18 118/71   BMET    Component Value Date/Time   NA 139 09/17/2018 0938   NA 137 06/30/2018 1019   K 3.9 09/17/2018 0938   CL 105 09/17/2018 0938   CO2 24 09/17/2018 0938   GLUCOSE 90 09/17/2018 0938   BUN 20 09/17/2018 0938   BUN 17 06/30/2018 1019   CREATININE 1.00 09/17/2018 0938   CALCIUM 8.8 (L) 09/17/2018 0938   GFRNONAA 59 (L) 09/17/2018 0938   GFRAA >60 09/17/2018 3335    Renal function: CrCl cannot be calculated (Patient's most recent lab result is older than the maximum 21 days allowed.).  Clinical ASCVD: No  The 10-year ASCVD risk score Mikey Bussing DC Jr., et al., 2013) is: 4.4%   Values used to calculate the score:     Age: 63 years     Sex: Female     Is Non-Hispanic African American: Yes     Diabetic: No     Tobacco smoker: No     Systolic Blood Pressure: 456 mmHg     Is BP treated: No     HDL Cholesterol:  67 mg/dL     Total Cholesterol: 146 mg/dL  A/P: Hypertension longstanding currently controlled on current medications. BP Goal = <130/80 mmHg. Patient is adherent with HCTZ. I have taken Exforge off her med list and added HCTZ 25 mg daily.  -Continued HCTZ 25 mg daily.  -[name] to see patient for completion of SCAT paperwork  -Counseled on lifestyle modifications for blood pressure control including reduced dietary sodium, increased exercise, adequate sleep  Results reviewed and written information provided. Total time in face-to-face counseling 15 minutes.   F/U Clinic Visit in Oct w/ PCP.    Patient seen with: Dillard Essex PharmD Candidate  Class of 2022 [place], PharmD, [place] [phone]

## 2018-10-28 NOTE — Patient Instructions (Signed)
Thank you for coming to see [place] today.   Blood pressure today is well-controlled.   Continue taking blood pressure medication as prescribed.   Limiting salt and caffeine, as well as exercising as able for at least 30 minutes for 5 days out of the week, can also help you lower your blood pressure.  Take your blood pressure at home if you are able. Please write down these numbers and bring them to your visits.  If you have any questions about medications, please call me [phone].  Lurena Joiner

## 2018-10-28 NOTE — Telephone Encounter (Signed)
Met with the patient when she was in the clinic today and completed SCAT application. 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  Completed application faxed to SCAT eligibility

## 2018-10-29 ENCOUNTER — Encounter: Payer: Self-pay | Admitting: Pharmacist

## 2018-11-04 ENCOUNTER — Other Ambulatory Visit (HOSPITAL_COMMUNITY): Payer: Self-pay | Admitting: Psychiatry

## 2018-11-04 ENCOUNTER — Telehealth: Payer: Self-pay

## 2018-11-04 ENCOUNTER — Ambulatory Visit (INDEPENDENT_AMBULATORY_CARE_PROVIDER_SITE_OTHER): Payer: Medicare Other | Admitting: Orthopedic Surgery

## 2018-11-04 ENCOUNTER — Encounter: Payer: Self-pay | Admitting: Orthopedic Surgery

## 2018-11-04 DIAGNOSIS — F4310 Post-traumatic stress disorder, unspecified: Secondary | ICD-10-CM

## 2018-11-04 DIAGNOSIS — Z96652 Presence of left artificial knee joint: Secondary | ICD-10-CM

## 2018-11-04 DIAGNOSIS — F319 Bipolar disorder, unspecified: Secondary | ICD-10-CM

## 2018-11-04 NOTE — Telephone Encounter (Signed)
Call received from [facility]/SCAT who confirmed receipt of the SCAT application.

## 2018-11-04 NOTE — Addendum Note (Signed)
Addended by: Laurann [place] on: 11/04/2018 11:18 AM   Modules accepted: Orders

## 2018-11-04 NOTE — Progress Notes (Signed)
   Post-Op Visit Note   Patient: Cheryl Prince           Date of Birth: 1955-09-25           MRN: [id number] Visit Date: 11/04/2018 PCP: Antony Blackbird, MD   Assessment & Plan:  Chief Complaint:  Chief Complaint  Patient presents with  . Left Knee - Routine Post Op   Visit Diagnoses:  1. Status post total left knee replacement     Plan: [name] is now 6 weeks out left total knee replacement.  Doing well.  And home health physical therapy.  On exam full extension to about 105 of flexion.  She is able to sleep through the night.  Doing the elliptical every day for about 15 to 20 minutes twice a day.  Still has strength issues in terms of getting up and down stairs.  Plan is to continue home health PT 2 weeks and outpatient PT for 2 to 4 weeks.  Come back for final check in 6 weeks.  No effusion in the knee today.  Follow-Up Instructions: Return in about 6 weeks (around 12/16/2018).   Orders:  No orders of the defined types were placed in this encounter.  No orders of the defined types were placed in this encounter.   Imaging: No results found.  PMFS History: Patient Active Problem List   Diagnosis Date Noted  . Arthritis of left knee 09/22/2018  . Alcohol use, unspecified with unspecified alcohol-induced disorder ([place]) 05/27/2018  . PTSD (post-traumatic stress disorder) 05/30/2016  . Alpha thalassemia trait 05/30/2016  . Hypertension 05/30/2016  . Rotator cuff tendonitis, left 05/30/2016  . Osteoarthritis of left knee 05/30/2016  . Carpal tunnel syndrome 05/30/2016  . Prediabetes 05/30/2016  . Onychomycosis 05/30/2016  . S/P tonsillectomy 05/30/2016  . Goiter, toxic diffuse 05/30/2016  . Chronic tonsillitis 05/30/2016   Past Medical History:  Diagnosis Date  . Anxiety   . Arthritis   . Asthma    borderline bronchitis  . Bronchitis   . GERD (gastroesophageal reflux disease)   . Hypertension   . Sleep apnea    history of, MD tookpt. off machine 7 yrs,  ago    Family History  Problem Relation Age of Onset  . Hypertension Father   . Diabetes Father   . Lupus Daughter     Past Surgical History:  Procedure Laterality Date  . ABDOMINAL HYSTERECTOMY    . adnoides    . BREAST SURGERY    . CHOLECYSTECTOMY    . DILATION AND CURETTAGE OF UTERUS    . GASTRIC BYPASS  1998  . IUD REMOVAL    . TONSILLECTOMY    . TOTAL KNEE ARTHROPLASTY Left 09/22/2018  . TOTAL KNEE ARTHROPLASTY Left 09/22/2018   Procedure: LEFT TOTAL KNEE ARTHROPLASTY;  Surgeon: Meredith Pel, MD;  Location: [facility];  Service: Orthopedics;  Laterality: Left;   Social History   Occupational History  . Not on file  Tobacco Use  . Smoking status: Former Smoker    Packs/day: 1.50    Years: 18.00    Pack years: 27.00    Types: Cigarettes    Quit date: 09/16/1984    Years since quitting: 34.1  . Smokeless tobacco: Never Used  . Tobacco comment: quit 1986  Substance and Sexual Activity  . Alcohol use: Yes    Comment: social  . Drug use: No  . Sexual activity: Not on file

## 2018-11-16 ENCOUNTER — Other Ambulatory Visit: Payer: Self-pay | Admitting: Surgical

## 2018-11-16 ENCOUNTER — Telehealth: Payer: Self-pay | Admitting: Orthopedic Surgery

## 2018-11-16 MED ORDER — OXYCODONE HCL 5 MG PO TABS: 5.0000 mg | ORAL_TABLET | Freq: Every day | ORAL | 0 refills | Status: DC | PRN

## 2018-11-16 NOTE — Telephone Encounter (Signed)
Patient called needing Rx refilled (Oxycodone 5 mg)  Patient said she is out of her medication. Patient said she has been out for 9 days. The number to contact patient is [phone]

## 2018-11-16 NOTE — Telephone Encounter (Signed)
Ok to rf? pls advise. thanks

## 2018-11-16 NOTE — Telephone Encounter (Signed)
Can you resubmit rx from earlier to pharmacy below. Thanks.

## 2018-11-16 NOTE — Telephone Encounter (Signed)
Lurena Joiner to submit to different pharmacy. See other note.

## 2018-11-16 NOTE — Telephone Encounter (Signed)
Patient called. Says her meds should be sent to [facility] on [address]. Her call back number is [phone]. Thanks

## 2018-11-24 ENCOUNTER — Other Ambulatory Visit: Payer: Self-pay

## 2018-11-24 ENCOUNTER — Ambulatory Visit: Payer: Medicare Other | Attending: Orthopedic Surgery | Admitting: Physical Therapy

## 2018-11-24 DIAGNOSIS — R2681 Unsteadiness on feet: Secondary | ICD-10-CM | POA: Insufficient documentation

## 2018-11-24 DIAGNOSIS — M25562 Pain in left knee: Secondary | ICD-10-CM | POA: Insufficient documentation

## 2018-11-24 DIAGNOSIS — M6281 Muscle weakness (generalized): Secondary | ICD-10-CM | POA: Insufficient documentation

## 2018-11-24 DIAGNOSIS — R2689 Other abnormalities of gait and mobility: Secondary | ICD-10-CM | POA: Diagnosis present

## 2018-11-24 NOTE — Therapy (Signed)
[facility] [address] [place], [place], [address] Phone: [phone]   Fax:  [phone]  Physical Therapy Evaluation  Patient Details  Name: Cheryl Prince MRN: [id number] Date of Birth: 12/14/1955 Referring Provider (PT): Dr. Cammy CopaGregory Scott Dean   Encounter Date: 11/24/2018  PT End of Session - 11/25/18 1848    Visit Number  1    Number of Visits  13    Date for PT Re-Evaluation  01/15/19    Authorization Type  [employer]HC Medicare    Authorization Time Period  11-24-18 - 02-21-19    PT Start Time  1102    PT Stop Time  1148    PT Time Calculation (min)  46 min    Activity Tolerance  Patient tolerated treatment well    Behavior During Therapy  [facility] for tasks assessed/performed       Past Medical History:  Diagnosis Date  . Anxiety   . Arthritis   . Asthma    borderline bronchitis  . Bronchitis   . GERD (gastroesophageal reflux disease)   . Hypertension   . Sleep apnea    history of, MD tookpt. off machine 7 yrs, ago    Past Surgical History:  Procedure Laterality Date  . ABDOMINAL HYSTERECTOMY    . adnoides    . BREAST SURGERY    . CHOLECYSTECTOMY    . DILATION AND CURETTAGE OF UTERUS    . GASTRIC BYPASS  1998  . IUD REMOVAL    . TONSILLECTOMY    . TOTAL KNEE ARTHROPLASTY Left 09/22/2018  . TOTAL KNEE ARTHROPLASTY Left 09/22/2018   Procedure: LEFT TOTAL KNEE ARTHROPLASTY;  Surgeon: Cammy Copaean, Gregory Scott, MD;  [facility];  Service: Orthopedics;  Laterality: Left;    There were no vitals filed for this visit.    106 flexion max:  100 degrees flexion comfortable position per pt report  seated Lt knee 4/5 ; 4/5 prone      Lt knee active flexion 96 degrees in prone position   [facility] - 11/25/18 0001      Assessment   Medical Diagnosis  s/p Lt Total knee replacement    Referring Provider (PT)  Dr. Cammy CopaGregory Scott Dean    Onset Date/Surgical Date  09/22/18    Hand Dominance  Left     Prior Therapy  pt had OP PT at this facility from April to July 2020 prior to Lt TKR       Precautions   Precautions  Fall      Restrictions   Weight Bearing Restrictions  No      Balance Screen   Has the patient fallen in the past 6 months  Yes    How many times?  1   on 06-09-18 - Lt knee gave way and she fell   Has the patient had a decrease in activity level because of a fear of falling?   No    Is the patient reluctant to leave their home because of a fear of falling?   Yes      Home Environment   Living Environment  Private residence    Living Arrangements  Alone    Type of Home  House    Home Access  Level entry    Home Layout  Two level    Alternate Level Stairs-Number of Steps  12    Home Equipment  [place] - single point      Prior Function  Level of Independence  Independent with basic ADLs;Independent with household mobility with device;Independent with community mobility with device    Vocation  Retired      ROM / Strength   AROM / PROM / Strength  AROM      AROM   AROM Assessment Site  Knee    Right/Left Knee  Left    Left Knee Extension  -30    Left Knee Flexion  106      Ambulation/Gait   Ambulation/Gait  Yes    Ambulation/Gait Assistance  6: Modified independent (Device/Increase time)    Ambulation Distance (Feet)  780 Feet   in 6MWT with SPC   Assistive device  Straight cane    Gait Pattern  Step-through pattern;Antalgic    Ambulation Surface  Level;Indoor    Stairs  Yes    Stairs Assistance  5: Supervision    Stair Management Technique  Forwards;Two rails;Step to pattern   forwards up; backwards down   Number of Stairs  4    Height of Stairs  6      Lt hip flexor strength 4-/5 with c/o pain Quad strength 4/5 LLE   hip ext 4-/5  Pt able to perform sit to stand with no UE support from mat table  Pt amb. 780' in 6" walk test with [facility]                PT Long Term Goals - 11/25/18 1905      PT LONG TERM GOAL #1   Title  Pt  will increase Lt knee active flexion to 120 degrees for increased mobility and LLE positioning.    Baseline  Lt knee active flexion 106; comfortable flexion 100 degrees;  passive Lt knee flexion 108 degrees    Time  6    Period  Weeks    Status  New    Target Date  01/15/19      PT LONG TERM GOAL #2   Title  Pt will be independent in HEP including aquatic exercises for continuation upon D/C from PT.    Time  6    Period  Weeks    Status  New    Target Date  01/15/19      PT LONG TERM GOAL #3   Title  PT will negotiate 4 steps using 1 hand rail with step over step sequence for incr. efficiency with step negotiation.    Time  6    Period  Weeks    Status  New    Target Date  01/15/19      PT LONG TERM GOAL #4   Title  Pt will ambulate 500' on flat even and uneven surfaces without SPC modified independently.    Time  6    Period  Weeks    Status  New    Target Date  01/15/19      PT LONG TERM GOAL #5   Title  Pt will amb. >/= 850' in 6" walk test to demonstrate increased gait speed and endurance.    Baseline  780' in 6" walk test with [facility] on 11-24-18    Time  6    Period  Weeks    Status  New    Target Date  01/15/19             Plan - 11/25/18 1853    Clinical Impression Statement  Pt is a 63 yr old lady s/p Lt TKA on 09-22-18;  pt  presents with decreased Lt knee flexion actively and passively and decreased strength Lt knee musc.  Pt is ambulating with SPC to evaluation - states she uses RW early in the am's due to Lt knee stiffness. Pt will benefit from PT to address Lt knee ROM and strength deficits and gait and balance deficits.    Personal Factors and Comorbidities  Comorbidity 1;Fitness    Examination-Activity Limitations  Stand;Transfers;Stairs;Squat;Locomotion Level    Examination-Participation Restrictions  Meal Prep;Community Activity;Cleaning;Laundry;Shop    Stability/Clinical Decision Making  Stable/Uncomplicated    Clinical Decision Making  Low    Rehab  Potential  Good    PT Frequency  2x / week    PT Duration  6 weeks    PT Treatment/Interventions  ADLs/Self Care Home Management;Aquatic Therapy;Balance training;Therapeutic exercise;Therapeutic activities;Gait training;Neuromuscular re-education;Patient/family education    PT Next Visit Plan  balance and strengthening HEP; aquatic therapy to be initiated    Consulted and Agree with Plan of Care  Patient       Patient will benefit from skilled therapeutic intervention in order to improve the following deficits and impairments:  Difficulty walking, Pain, Decreased strength, Decreased balance, Decreased activity tolerance, Impaired flexibility, Decreased range of motion  Visit Diagnosis: Other abnormalities of gait and mobility - Plan: PT plan of care cert/re-cert  Muscle weakness (generalized) - Plan: PT plan of care cert/re-cert  Unsteadiness on feet - Plan: PT plan of care cert/re-cert  Left knee pain, unspecified chronicity - Plan: PT plan of care cert/re-cert     Problem List Patient Active Problem List   Diagnosis Date Noted  . Arthritis of left knee 09/22/2018  . Alcohol use, unspecified with unspecified alcohol-induced disorder ([facility]) 05/27/2018  . PTSD (post-traumatic stress disorder) 05/30/2016  . Alpha thalassemia trait 05/30/2016  . Hypertension 05/30/2016  . Rotator cuff tendonitis, left 05/30/2016  . Osteoarthritis of left knee 05/30/2016  . Carpal tunnel syndrome 05/30/2016  . Prediabetes 05/30/2016  . Onychomycosis 05/30/2016  . S/P tonsillectomy 05/30/2016  . Goiter, toxic diffuse 05/30/2016  . Chronic tonsillitis 05/30/2016    DildayDonavan Burnet, PT 11/25/2018, 7:18 PM  [facility] [address] [place], [place], [address] Phone: [phone]   Fax:  [phone]  Name: Teresina Bugaj MRN: [id number] Date of Birth: 04-Jan-1956

## 2018-11-25 ENCOUNTER — Ambulatory Visit (INDEPENDENT_AMBULATORY_CARE_PROVIDER_SITE_OTHER): Payer: Medicare Other | Admitting: Gastroenterology

## 2018-11-25 ENCOUNTER — Encounter: Payer: Self-pay | Admitting: Gastroenterology

## 2018-11-25 ENCOUNTER — Encounter: Payer: Self-pay | Admitting: Physical Therapy

## 2018-11-25 VITALS — BP 122/80 | HR 56 | Temp 98.6°F | Ht 63.0 in | Wt 200.2 lb

## 2018-11-25 DIAGNOSIS — D649 Anemia, unspecified: Secondary | ICD-10-CM | POA: Diagnosis not present

## 2018-11-25 DIAGNOSIS — K219 Gastro-esophageal reflux disease without esophagitis: Secondary | ICD-10-CM

## 2018-11-25 DIAGNOSIS — Z9884 Bariatric surgery status: Secondary | ICD-10-CM | POA: Diagnosis not present

## 2018-11-25 DIAGNOSIS — R0602 Shortness of breath: Secondary | ICD-10-CM | POA: Diagnosis not present

## 2018-11-25 MED ORDER — FAMOTIDINE 20 MG PO TABS: 20.0000 mg | ORAL_TABLET | Freq: Two times a day (BID) | ORAL | 3 refills | Status: DC

## 2018-11-25 MED ORDER — NA SULFATE-K SULFATE-MG SULF 17.5-3.13-1.6 GM/177ML PO SOLN: 1.0000 | ORAL | 0 refills | Status: DC

## 2018-11-25 NOTE — Patient Instructions (Addendum)
Resume famotidine 20 mg twice daily for reflux.  The other medicine that you are taking, prochlorperazine, is for nausea and vomiting. Please take both of these medications.  I have recommended an upper endoscopy and colonoscopy to evaluate your anemia.  We will work to obtain all of your prior records from [facility].     I recommend that you take daily supplements to prevent post bariatric surgery nutritional deficiencies.  A single multivitamin may be used as long as it includes the following: Iron 45 mg daily, vitamin B12 500 mcg daily, folate 800 mcg daily, vitamin D 3000 international units daily, vitamin A 5000 international units daily, him in ED 15 mg daily, vitamin K 300 mcg daily, zinc 8 mg daily, copper 1 mg daily, selenium, magnesium and trace minerals that are contained in a multivitamin such as molybdenum, manganese, and chromium. A B complex vitamin of at least 50 mg should be used in addition to a multivitamin.  Please call me with any questions or concerns that develop prior to your endoscopy.
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
## 2018-11-26 ENCOUNTER — Encounter: Payer: Self-pay | Admitting: Gastroenterology

## 2018-11-30 ENCOUNTER — Other Ambulatory Visit: Payer: Self-pay | Admitting: Pharmacist

## 2018-11-30 DIAGNOSIS — R0602 Shortness of breath: Secondary | ICD-10-CM

## 2018-11-30 MED ORDER — FAMOTIDINE 20 MG PO TABS: 20.0000 mg | ORAL_TABLET | Freq: Two times a day (BID) | ORAL | 0 refills | Status: DC

## 2018-11-30 MED ORDER — HYDROCHLOROTHIAZIDE 25 MG PO TABS: 25.0000 mg | ORAL_TABLET | Freq: Every day | ORAL | 0 refills | Status: DC

## 2018-12-02 ENCOUNTER — Ambulatory Visit: Payer: Self-pay | Admitting: Physical Therapy

## 2018-12-02 ENCOUNTER — Other Ambulatory Visit: Payer: Self-pay

## 2018-12-02 DIAGNOSIS — R2681 Unsteadiness on feet: Secondary | ICD-10-CM

## 2018-12-02 DIAGNOSIS — M6281 Muscle weakness (generalized): Secondary | ICD-10-CM

## 2018-12-02 DIAGNOSIS — R2689 Other abnormalities of gait and mobility: Secondary | ICD-10-CM

## 2018-12-03 ENCOUNTER — Encounter: Payer: Self-pay | Admitting: Physical Therapy

## 2018-12-03 NOTE — Therapy (Signed)
[facility] [address] [place], [place], [address] Phone: [phone]   Fax:  [phone]  Physical Therapy Treatment  Patient Details  Name: Cheryl Prince MRN: [id number] Date of Birth: 27-Oct-1955 Referring Provider (PT): Dr. Meredith Pel   Encounter Date: 12/02/2018  PT End of Session - 12/03/18 1946    Visit Number  2    Number of Visits  13    Date for PT Re-Evaluation  01/15/19    Authorization Type  [employer] Medicare    Authorization Time Period  11-24-18 - 02-21-19    PT Start Time  1500    PT Stop Time  1545    PT Time Calculation (min)  45 min    Equipment Utilized During Treatment  --   ankle cuffs for resistance   Activity Tolerance  Patient tolerated treatment well    Behavior During Therapy  [facility] for tasks assessed/performed       Past Medical History:  Diagnosis Date  . Anxiety   . Arthritis   . Asthma    borderline bronchitis  . Bronchitis   . GERD (gastroesophageal reflux disease)   . Hypertension   . Sleep apnea    history of, MD tookpt. off machine 7 yrs, ago    Past Surgical History:  Procedure Laterality Date  . ABDOMINAL HYSTERECTOMY    . adnoides    . BREAST SURGERY    . CHOLECYSTECTOMY    . DILATION AND CURETTAGE OF UTERUS    . GASTRIC BYPASS  1998  . IUD REMOVAL    . TONSILLECTOMY    . TOTAL KNEE ARTHROPLASTY Left 09/22/2018  . TOTAL KNEE ARTHROPLASTY Left 09/22/2018   Procedure: LEFT TOTAL KNEE ARTHROPLASTY;  Surgeon: Meredith Pel, MD;  Location: [facility];  Service: Orthopedics;  Laterality: Left;    There were no vitals filed for this visit.  Subjective Assessment - 12/03/18 1943    Subjective  Pt states her left knee has been bothering her lately - states she knows it's going to rain soon; pt presents for first aquatic therapy session since Lt TKR on 09-22-18; pt brought rollator for assistance with ambulation because she states she knew she would be tired  after the session    Pertinent History  Lt rotator cuff tendonitis: carpal tunnel syndrome: HTN; Lt TKA 09-22-18    Limitations  Standing;Walking    How long can you sit comfortably?  pt states she usually does not sit >20"    How long can you stand comfortably?  3"-5" - no longer wearing knee sleeve    How long can you walk comfortably?  unsure - states she is doing 5-10" on treadmill    Patient Stated Goals  be able to negotiate steps with step over step sequence; increase Lt knee flexion to at least 120 degrees    Currently in Pain?  Yes    Pain Score  4     Pain Location  Knee    Pain Orientation  Left    Pain Descriptors / Indicators  Aching;Discomfort;Dull    Pain Type  Surgical pain    Pain Onset  More than a month ago    Pain Frequency  Intermittent         Aquatic therapy at [facility] - pool temp. 87.2 degrees  Patient seen for aquatic therapy today.  Treatment took place in water 3.5-4 feet deep depending upon activity.  Pt entered  And exited the  pool via step negotiation with use of hand rails with supervision for safety.   Pt performed water walking for warm up - forwards, backwards, sideways 44m across pool x 1 rep each direction; Sidestepping with small squats approx. 30' and the pt reported Lt knee pain with this activity so this exercise was modified to sidestepping only with the squat eliminated Marching forwards and backwards 3m x 2 reps with pt flexing Lt knee to tolerance  Pt performed standing Lt hip abduction, extension and flexion on each leg 10 reps each direction with use of buoyant ankle cuff; Lt knee extension with ankle cuff in seated position on bench in pool 10 reps - assistance given to keep Rt foot on floor to decrease buoyancy of body in water;  Standing Lt knee flexion 10 reps wit use of ankle cuff Marching in place 10 reps each without UE support Heel raises bil. LE's 10 reps Partial squats (small ROM due to Lt knee discomfort) x 5 reps with pt holding  at pool edge prn  Crossovers front - RLE over LLE for approx. 30', then LLE over RLE for approx. 30'  Pt performed amb. 39m x 1 rep at end of session for cool down  Pt requires buoyancy of water for support and for the ability to off load the body while in water;  Weight of LLE is reduced which allows More freedom of movement to facilitate increased AROM of Lt knee; pt requires viscosity of water for resistance for strengthening                                       PT Long Term Goals - 12/03/18 1953      PT LONG TERM GOAL #1   Title  Pt will increase Lt knee active flexion to 120 degrees for increased mobility and LLE positioning.    Baseline  Lt knee active flexion 106; comfortable flexion 100 degrees;  passive Lt knee flexion 108 degrees    Time  6    Period  Weeks    Status  New      PT LONG TERM GOAL #2   Title  Pt will be independent in HEP including aquatic exercises for continuation upon D/C from PT.    Time  6    Period  Weeks    Status  New      PT LONG TERM GOAL #3   Title  PT will negotiate 4 steps using 1 hand rail with step over step sequence for incr. efficiency with step negotiation.    Time  6    Period  Weeks    Status  New      PT LONG TERM GOAL #4   Title  Pt will ambulate 500' on flat even and uneven surfaces without SPC modified independently.    Time  6    Period  Weeks    Status  New      PT LONG TERM GOAL #5   Title  Pt will amb. >/= 850' in 6" walk test to demonstrate increased gait speed and endurance.    Baseline  780' in 6" walk test with [facility] on 11-24-18    Time  6    Period  Weeks    Status  New            Plan - 12/03/18 1947    Clinical Impression Statement  Pt tolerated aquatic exercises well with focus of exercises on Lt knee strengthening (quads and hamstrings) and increasing Lt knee AROM:  pt did report some discomfort with sidestepping with squats after performing approx. 30' across pool.   Modified this exercise to sidestepping only and pt tolerated it much better with less c/o Lt knee pain.  Pt tolerated water walking well with minimal c/o Lt knee discomfort and no LOB.    Personal Factors and Comorbidities  Comorbidity 1;Fitness    Examination-Activity Limitations  Stand;Transfers;Stairs;Squat;Locomotion Level    Examination-Participation Restrictions  Meal Prep;Community Activity;Cleaning;Laundry;Shop    Stability/Clinical Decision Making  Stable/Uncomplicated    Rehab Potential  Good    PT Frequency  2x / week    PT Duration  6 weeks    PT Treatment/Interventions  ADLs/Self Care Home Management;Aquatic Therapy;Balance training;Therapeutic exercise;Therapeutic activities;Gait training;Neuromuscular re-education;Patient/family education    PT Next Visit Plan  balance and strengthening HEP - exercises to increase Lt knee flexion and for Lt knee strengthening:  aquatic therapy initiated on 12-02-18 ( I plan to give aquatic exercises for HEP upon D/C)    PT Home Exercise Plan  theraband for Lt quads and hamstrings ( as tolerated)    Consulted and Agree with Plan of Care  Patient       Patient will benefit from skilled therapeutic intervention in order to improve the following deficits and impairments:  Difficulty walking, Pain, Decreased strength, Decreased balance, Decreased activity tolerance, Impaired flexibility, Decreased range of motion  Visit Diagnosis: Other abnormalities of gait and mobility  Muscle weakness (generalized)  Unsteadiness on feet     Problem List Patient Active Problem List   Diagnosis Date Noted  . Arthritis of left knee 09/22/2018  . Alcohol use, unspecified with unspecified alcohol-induced disorder (HCC) 05/27/2018  . PTSD (post-traumatic stress disorder) 05/30/2016  . Alpha thalassemia trait 05/30/2016  . Hypertension 05/30/2016  . Rotator cuff tendonitis, left 05/30/2016  . Osteoarthritis of left knee 05/30/2016  . Carpal tunnel syndrome  05/30/2016  . Prediabetes 05/30/2016  . Onychomycosis 05/30/2016  . S/P tonsillectomy 05/30/2016  . Goiter, toxic diffuse 05/30/2016  . Chronic tonsillitis 05/30/2016    Karisha Marlin, Donavan BurnetLinda Suzanne, PT, ATRIC 12/03/2018, 7:56 PM  [facility] [address] [place], [place], [address] Phone: [phone]   Fax:  [phone]  Name: Cheryl Prince MRN: [id number] Date of Birth: 08-01-1955

## 2018-12-08 ENCOUNTER — Telehealth: Payer: Self-pay

## 2018-12-08 NOTE — Telephone Encounter (Signed)
Covid-19 screening questions   Do you now or have you had a fever in the last 14 days?  Do you have any respiratory symptoms of shortness of breath or cough now or in the last 14 days?  Do you have any family members or close contacts with diagnosed or suspected Covid-19 in the past 14 days?  Have you been tested for Covid-19 and found to be positive?       

## 2018-12-09 ENCOUNTER — Encounter: Payer: Self-pay | Admitting: Gastroenterology

## 2018-12-09 ENCOUNTER — Other Ambulatory Visit: Payer: Self-pay

## 2018-12-09 ENCOUNTER — Ambulatory Visit (AMBULATORY_SURGERY_CENTER): Payer: Medicare Other | Admitting: Gastroenterology

## 2018-12-09 VITALS — BP 138/81 | HR 66 | Temp 98.8°F | Resp 24 | Ht 63.0 in | Wt 200.0 lb

## 2018-12-09 DIAGNOSIS — K5909 Other constipation: Secondary | ICD-10-CM | POA: Diagnosis not present

## 2018-12-09 DIAGNOSIS — K219 Gastro-esophageal reflux disease without esophagitis: Secondary | ICD-10-CM | POA: Diagnosis not present

## 2018-12-09 DIAGNOSIS — D509 Iron deficiency anemia, unspecified: Secondary | ICD-10-CM | POA: Diagnosis not present

## 2018-12-09 DIAGNOSIS — K2950 Unspecified chronic gastritis without bleeding: Secondary | ICD-10-CM | POA: Diagnosis not present

## 2018-12-09 MED ORDER — SODIUM CHLORIDE 0.9 % IV SOLN: 500.0000 mL | Freq: Once | INTRAVENOUS | Status: DC

## 2018-12-09 NOTE — Progress Notes (Signed)
Report to PACU, RN, vss, BBS= Clear.  

## 2018-12-09 NOTE — Op Note (Addendum)
[facility] Patient Name: Cheryl Prince Procedure Date: 12/09/2018 9:27 AM MRN: [id number] Endoscopist: Thornton Park MD, MD Age: 63 Referring MD:  Date of Birth: [birth year] Gender: Female Account #: [id number] Procedure:                Colonoscopy Indications:              Microcytic anemia without overt bleeding                           -On oral iron supplements                           Oxycodone-induced constipation                           Prior colonoscopy 10 years ago                           No known family history of colon cancer or polyps Medicines:                See the Anesthesia note for documentation of the                            administered medications Procedure:                Pre-Anesthesia Assessment:                           - Prior to the procedure, a History and Physical                            was performed, and patient medications and                            allergies were reviewed. The patient's tolerance of                            previous anesthesia was also reviewed. The risks                            and benefits of the procedure and the sedation                            options and risks were discussed with the patient.                            All questions were answered, and informed consent                            was obtained. Prior Anticoagulants: The patient has                            taken no previous anticoagulant or antiplatelet                            agents. ASA  Grade Assessment: III - A patient with                            severe systemic disease. After reviewing the risks                            and benefits, the patient was deemed in                            satisfactory condition to undergo the procedure.                           After obtaining informed consent, the colonoscope                            was passed under direct vision. Throughout the   procedure, the patient's blood pressure, pulse, and                            oxygen saturations were monitored continuously. The                            Colonoscope was introduced through the anus and                            advanced to the the cecum, identified by                            appendiceal orifice and ileocecal valve. A second                            forward view of the right colon was performed. The                            colonoscopy was performed with moderate difficulty                            due to a redundant colon, significant looping and a                            tortuous colon. Successful completion of the                            procedure was aided by applying abdominal pressure.                            The patient tolerated the procedure well. The                            quality of the bowel preparation was good. The                            ileocecal valve, appendiceal orifice, and rectum  were photographed. Scope In: 9:42:49 AM Scope Out: 9:53:02 AM Scope Withdrawal Time: 0 hours 6 minutes 28 seconds  Total Procedure Duration: 0 hours 10 minutes 13 seconds  Findings:                 The perianal and digital rectal examinations were                            normal.                           The entire examined colon appeared normal on direct                            and retroflexion views. Complications:            No immediate complications. Estimated Blood Loss:     Estimated blood loss: none. Impression:               - The entire examined colon is normal on direct and                            retroflexion views.                           - No source of anemia identified on this                            examination. Recommendation:           - Patient has a contact number available for                            emergencies. The signs and symptoms of potential                             delayed complications were discussed with the                            patient. Return to normal activities tomorrow.                            Written discharge instructions were provided to the                            patient.                           - Resume previous diet.                           - Continue present medications.                           - Repeat colonoscopy in 10 years for screening                            purposes. Tressia Danas MD, MD 12/09/2018 10:02:42 AM This  report has been signed electronically.

## 2018-12-09 NOTE — Patient Instructions (Signed)
Please read handouts provided. Await pathology results. Continue present medications, including famotidine 20 mg daily.       YOU HAD AN ENDOSCOPIC PROCEDURE TODAY AT [facility]:   Refer to the procedure report that was given to you for any specific questions about what was found during the examination.  If the procedure report does not answer your questions, please call your gastroenterologist to clarify.  If you requested that your care partner not be given the details of your procedure findings, then the procedure report has been included in a sealed envelope for you to review at your convenience later.  YOU SHOULD EXPECT: Some feelings of bloating in the abdomen. Passage of more gas than usual.  Walking can help get rid of the air that was put into your GI tract during the procedure and reduce the bloating. If you had a lower endoscopy (such as a colonoscopy or flexible sigmoidoscopy) you may notice spotting of blood in your stool or on the toilet paper. If you underwent a bowel prep for your procedure, you may not have a normal bowel movement for a few days.  Please Note:  You might notice some irritation and congestion in your nose or some drainage.  This is from the oxygen used during your procedure.  There is no need for concern and it should clear up in a day or so.  SYMPTOMS TO REPORT IMMEDIATELY:   Following lower endoscopy (colonoscopy or flexible sigmoidoscopy):  Excessive amounts of blood in the stool  Significant tenderness or worsening of abdominal pains  Swelling of the abdomen that is new, acute  Fever of 100F or higher   Following upper endoscopy (EGD)  Vomiting of blood or coffee ground material  New chest pain or pain under the shoulder blades  Painful or persistently difficult swallowing  New shortness of breath  Fever of 100F or higher  Black, tarry-looking stools  For urgent or emergent issues, a gastroenterologist can be reached at any  hour by calling [phone].   DIET:  We do recommend a small meal at first, but then you may proceed to your regular diet.  Drink plenty of fluids but you should avoid alcoholic beverages for 24 hours.  ACTIVITY:  You should plan to take it easy for the rest of today and you should NOT DRIVE or use heavy machinery until tomorrow (because of the sedation medicines used during the test).    FOLLOW UP: Our staff will call the number listed on your records 48-72 hours following your procedure to check on you and address any questions or concerns that you may have regarding the information given to you following your procedure. If we do not reach you, we will leave a message.  We will attempt to reach you two times.  During this call, we will ask if you have developed any symptoms of COVID 19. If you develop any symptoms (ie: fever, flu-like symptoms, shortness of breath, cough etc.) before then, please call [phone].  If you test positive for Covid 19 in the 2 weeks post procedure, please call and report this information to [place].    If any biopsies were taken you will be contacted by phone or by letter within the next 1-3 weeks.  Please call us at [phone] if you have not heard about the biopsies in 3 weeks.    SIGNATURES/CONFIDENTIALITY: You and/or your care partner have signed paperwork which will be entered into your electronic medical record.  These signatures attest to the fact that that the information above on your After Visit Summary has been reviewed and is understood.  Full responsibility of the confidentiality of this discharge information lies with you and/or your care-partner.

## 2018-12-09 NOTE — Progress Notes (Addendum)
[facility] Temp CW - VS  Pt had left knee arthroplasty July 30 th, 2020.  She has a blanket under her l knee.  Pt care parnter is her daughter.  Their transportation is the [employer].  It is scheduled to pick pt up at 13:00.  If pt is finished sooner, her daughter can call and request to be picked up sooner. maw

## 2018-12-09 NOTE — Progress Notes (Signed)
Called to room to assist during endoscopic procedure.  Patient ID and intended procedure confirmed with present staff. Received instructions for my participation in the procedure from the performing physician.  

## 2018-12-09 NOTE — Op Note (Signed)
[facility] Patient Name: Cheryl Prince Procedure Date: 12/09/2018 9:27 AM MRN: [id number] Endoscopist: Tressia Danas MD, MD Age: 63 Referring MD:  Date of Birth: [birth year] Gender: Female Account #: [id number] Procedure:                Upper GI endoscopy Indications:              Microcytic anemia without overt bleeding                           -On oral iron supplements                           Nausea and vomiting with intermittent flecks of                            blood in the emesis                           Reflux on famotidine                           Roux-en-Y gastric bypass surgery 2001 at [facility]                            and Women's Medicines:                See the Anesthesia note for documentation of the                            administered medications Procedure:                Pre-Anesthesia Assessment:                           - Prior to the procedure, a History and Physical                            was performed, and patient medications and                            allergies were reviewed. The patient's tolerance of                            previous anesthesia was also reviewed. The risks                            and benefits of the procedure and the sedation                            options and risks were discussed with the patient.                            All questions were answered, and informed consent                            was obtained. Prior Anticoagulants: The patient  has                            taken no previous anticoagulant or antiplatelet                            agents. ASA Grade Assessment: III - A patient with                            severe systemic disease. After reviewing the risks                            and benefits, the patient was deemed in                            satisfactory condition to undergo the procedure.                           After obtaining informed consent, the endoscope was                            passed under direct vision. Throughout the                            procedure, the patient's blood pressure, pulse, and                            oxygen saturations were monitored continuously. The                            Endoscope was introduced through the mouth, and                            advanced to the jejunum. The upper GI endoscopy was                            accomplished without difficulty. The patient                            tolerated the procedure well. Scope In: Scope Out: Findings:                 The examined esophagus was normal. Biopsies were                            obtained from the proximal and mid/distal esophagus                            with cold forceps for histology.                           Evidence of a Roux-en-Y gastrojejunostomy was                            found. The gastrojejunal anastomosis was  characterized by healthy appearing mucosa. This was                            traversed. The pouch-to-jejunum limb was                            characterized by healthy appearing mucosa. The                            jejunojejunal anastomosis was characterized by                            healthy appearing mucosa. The duodenum-to-jejunum                            limb was examined. Biopsies were taken from the                            gastric mucosa with a cold forceps for histology.                            Estimated blood loss was minimal.                           The examined jejunum was normal. This was biopsied                            with a cold forceps for histology. Estimated blood                            loss was minimal. Complications:            No immediate complications. Estimated blood loss:                            Minimal. Estimated Blood Loss:     Estimated blood loss was minimal. Impression:               - Normal esophagus. Biopsied.                            - Roux-en-Y gastrojejunostomy with gastrojejunal                            anastomosis characterized by healthy appearing                            mucosa. Biopsied.                           - Normal examined jejunum. Biopsied. Recommendation:           - Patient has a contact number available for                            emergencies. The signs and symptoms of potential  delayed complications were discussed with the                            patient. Return to normal activities tomorrow.                            Written discharge instructions were provided to the                            patient.                           - Resume previous diet today.                           - Continue present medications including famotidine                            20 mg daily.                           - Await pathology results.                           - Proceed with colonoscopy as previously planned. Tressia DanasKimberly Koah Chisenhall MD, MD 12/09/2018 9:58:38 AM This report has been signed electronically.

## 2018-12-10 ENCOUNTER — Ambulatory Visit: Payer: Medicare Other | Admitting: Rehabilitation

## 2018-12-11 ENCOUNTER — Encounter: Payer: Self-pay | Admitting: Gastroenterology

## 2018-12-11 ENCOUNTER — Telehealth: Payer: Self-pay

## 2018-12-11 NOTE — Telephone Encounter (Signed)
  Follow up Call-  Call back number 12/09/2018  Post procedure Call Back phone  # [phone] cell  Permission to leave phone message No     Patient questions:  Do you have a fever, pain , or abdominal swelling? No. Pain Score  0 *  Have you tolerated food without any problems? Yes.    Have you been able to return to your normal activities? Yes.    Do you have any questions about your discharge instructions: Diet   No. Medications  No. Follow up visit  No.  Do you have questions or concerns about your Care? No.  Actions: * If pain score is 4 or above: No action needed, pain <4.  1. Have you developed a fever since your procedure? no  2.   Have you had an respiratory symptoms (SOB or cough) since your procedure? no  3.   Have you tested positive for COVID 19 since your procedure no  4.   Have you had any family members/close contacts diagnosed with the COVID 19 since your procedure?  no   If yes to any of these questions please route to Joylene John, RN and Alphonsa Gin, Therapist, sports.

## 2018-12-14 ENCOUNTER — Ambulatory Visit: Payer: Medicare Other | Attending: Orthopedic Surgery | Admitting: Physical Therapy

## 2018-12-14 ENCOUNTER — Ambulatory Visit: Payer: Medicare Other | Admitting: Family Medicine

## 2018-12-14 DIAGNOSIS — R2681 Unsteadiness on feet: Secondary | ICD-10-CM | POA: Diagnosis present

## 2018-12-14 DIAGNOSIS — R2689 Other abnormalities of gait and mobility: Secondary | ICD-10-CM | POA: Insufficient documentation

## 2018-12-14 DIAGNOSIS — M6281 Muscle weakness (generalized): Secondary | ICD-10-CM

## 2018-12-15 ENCOUNTER — Other Ambulatory Visit: Payer: Self-pay

## 2018-12-15 ENCOUNTER — Ambulatory Visit: Payer: Medicare Other | Admitting: Physical Therapy

## 2018-12-15 ENCOUNTER — Ambulatory Visit (HOSPITAL_COMMUNITY): Payer: Self-pay | Admitting: Psychiatry

## 2018-12-15 ENCOUNTER — Encounter: Payer: Self-pay | Admitting: Physical Therapy

## 2018-12-15 DIAGNOSIS — R2689 Other abnormalities of gait and mobility: Secondary | ICD-10-CM

## 2018-12-15 DIAGNOSIS — R2681 Unsteadiness on feet: Secondary | ICD-10-CM

## 2018-12-15 DIAGNOSIS — M6281 Muscle weakness (generalized): Secondary | ICD-10-CM

## 2018-12-15 NOTE — Patient Instructions (Signed)
Knee Extension: Resisted (Sitting)    With band looped around right ankle and under other foot, straighten leg with ankle loop. Keep other leg bent to increase resistance. Repeat _10___ times per set. Do _3___ sets per session. Do _1-2___ sessions per day.  http://orth.exer.us/691   Copyright  VHI. All rights reserved.  Knee Flexion: Resisted (Sitting)    Sit with band under left foot and looped around ankle of supported leg. Pull unsupported leg back. Repeat __10__ times per set. Do __3__ sets per session. Do _1-2___ sessions per day.  http://orth.exer.us/695   Copyright  VHI. All rights reserved.

## 2018-12-15 NOTE — Therapy (Signed)
[place] [facility] [address] [place], [place], [address] Phone: [phone]   Fax:  [phone]  Physical Therapy Treatment  Patient Details  Name: Cheryl Prince MRN: [id number] Date of Birth: [birth year] Referring Provider (PT): Dr. Cammy CopaGregory Scott Dean   Encounter Date: 12/14/2018  PT End of Session - 12/15/18 2104    Visit Number  3    Number of Visits  13    Date for PT Re-Evaluation  01/15/19    Authorization Type  UHC Medicare    Authorization Time Period  11-24-18 - 02-21-19    PT Start Time  1330    PT Stop Time  1415    PT Time Calculation (min)  45 min    Equipment Utilized During Treatment  Other (comment)   ankle cuffs for resistance; pool noodle   Activity Tolerance  Patient tolerated treatment well    Behavior During Therapy  [facility] for tasks assessed/performed       Past Medical History:  Diagnosis Date  . Allergy   . Anemia   . Anxiety   . Arthritis   . Asthma    borderline bronchitis  . Bronchitis   . GERD (gastroesophageal reflux disease)   . Hypertension   . Sleep apnea    history of, MD tookpt. off machine 7 yrs, ago    Past Surgical History:  Procedure Laterality Date  . ABDOMINAL HYSTERECTOMY    . adnoides    . BREAST SURGERY    . CHOLECYSTECTOMY    . COLONOSCOPY    . DILATION AND CURETTAGE OF UTERUS    . GASTRIC BYPASS  1998  . IUD REMOVAL    . TONSILLECTOMY    . TOTAL KNEE ARTHROPLASTY Left 09/22/2018  . TOTAL KNEE ARTHROPLASTY Left 09/22/2018   Procedure: LEFT TOTAL KNEE ARTHROPLASTY;  Surgeon: Cammy Copaean, Gregory Scott, MD;  [facility];  Service: Orthopedics;  Laterality: Left;    There were no vitals filed for this visit.  Subjective Assessment - 12/15/18 2102    Subjective  Pt arrived to pool early (via [employer]) - decided to go ahead and start exercises in the pool on her own;    Pertinent History  Lt rotator cuff tendonitis: carpal tunnel syndrome: HTN; Lt TKA 09-22-18     Limitations  Standing;Walking    How long can you sit comfortably?  pt states she usually does not sit >20"    How long can you stand comfortably?  3"-5" - no longer wearing knee sleeve    How long can you walk comfortably?  unsure - states she is doing 5-10" on treadmill    Patient Stated Goals  be able to negotiate steps with step over step sequence; increase Lt knee flexion to at least 120 degrees    Currently in Pain?  Yes    Pain Score  3     Pain Location  Knee    Pain Orientation  Left    Pain Descriptors / Indicators  Aching;Discomfort;Dull    Pain Type  Surgical pain    Pain Onset  More than a month ago    Pain Frequency  Intermittent    Multiple Pain Sites  No        [facility] temp. 87.4 degrees  Patient seen for aquatic therapy today.  Treatment took place in water 3.5-4 feet deep depending upon activity.  Pt entered  And exited the pool via step negotiation with use  of hand rails with supervision for safety.  Runner's stretch for Lt hamstring and gastroc stretching;  Pt performed water walking for warm up - forwards, backwards, sideways 43m across pool x 1 rep each direction; Sidestepping with small squats 52m across pool  Marching forwards and backwards 63m x 2 reps with pt flexing Lt knee to tolerance  Pt performed standing Lt hip abduction, extension and flexion on each leg 10 reps each direction with use of buoyant ankle cuff;  Standing Lt knee flexion 10 reps wit use of ankle cuff Marching in place 10 reps each without UE support Heel raises bil. LE's 10 reps Partial squats (small ROM due to Lt knee discomfort) x 10 reps with pt holding at pool edge prn  Pt performed bicycling LE's with use of pool noodle for flotation 11m x 2 reps across pool  Pt performed hip extension closed chain - pushing small white noodle down toward pool floor 10 reps; Lt knee flexion with Lt foot on pool noodle 10 reps  Crossovers front - RLE over LLE 44m x 1 rep   Pt performed amb. 58m x 1 rep at end of session for cool down  Pt requires buoyancy of water for support and for the ability to off load the body while in water;  Weight of LLE is reduced which allows More freedom of movement to facilitate increased AROM of Lt knee; pt requires viscosity of water for resistance for strengthening                                                      PT Long Term Goals - 12/15/18 2108      PT LONG TERM GOAL #1   Title  Pt will increase Lt knee active flexion to 120 degrees for increased mobility and LLE positioning.    Baseline  Lt knee active flexion 106; comfortable flexion 100 degrees;  passive Lt knee flexion 108 degrees    Time  6    Period  Weeks    Status  New      PT LONG TERM GOAL #2   Title  Pt will be independent in HEP including aquatic exercises for continuation upon D/C from PT.    Time  6    Period  Weeks    Status  New      PT LONG TERM GOAL #3   Title  PT will negotiate 4 steps using 1 hand rail with step over step sequence for incr. efficiency with step negotiation.    Time  6    Period  Weeks    Status  New      PT LONG TERM GOAL #4   Title  Pt will ambulate 500' on flat even and uneven surfaces without SPC modified independently.    Time  6    Period  Weeks    Status  New      PT LONG TERM GOAL #5   Title  Pt will amb. >/= 850' in 6" walk test to demonstrate increased gait speed and endurance.    Baseline  780' in 6" walk test with [facility] on 11-24-18    Time  6    Period  Weeks    Status  New            Plan - 12/15/18  2106    Clinical Impression Statement  Pt able to perform aquatic exercises for Lt knee ROM and strengthening with less c/o pain today compared to that in previous aquatic PT session; pt able to perform sidestepping with squats with no c/o Lt knee pain.  Pt tolerated exercises well with short rest breaks for LLE fatigue.    Personal Factors and  Comorbidities  Comorbidity 1;Fitness    Examination-Activity Limitations  Stand;Transfers;Stairs;Squat;Locomotion Level    Examination-Participation Restrictions  Meal Prep;Community Activity;Cleaning;Laundry;Shop    Stability/Clinical Decision Making  Stable/Uncomplicated    Rehab Potential  Good    PT Frequency  2x / week    PT Duration  6 weeks    PT Treatment/Interventions  ADLs/Self Care Home Management;Aquatic Therapy;Balance training;Therapeutic exercise;Therapeutic activities;Gait training;Neuromuscular re-education;Patient/family education    PT Next Visit Plan  balance and strengthening HEP - exercises to increase Lt knee flexion and for Lt knee strengthening:  aquatic therapy initiated on 12-02-18 ( I plan to give aquatic exercises for HEP upon D/C)    PT Home Exercise Plan  theraband for Lt quads and hamstrings ( as tolerated)    Consulted and Agree with Plan of Care  Patient       Patient will benefit from skilled therapeutic intervention in order to improve the following deficits and impairments:  Difficulty walking, Pain, Decreased strength, Decreased balance, Decreased activity tolerance, Impaired flexibility, Decreased range of motion  Visit Diagnosis: Other abnormalities of gait and mobility  Muscle weakness (generalized)  Unsteadiness on feet     Problem List Patient Active Problem List   Diagnosis Date Noted  . Arthritis of left knee 09/22/2018  . Alcohol use, unspecified with unspecified alcohol-induced disorder ([place]) 05/27/2018  . PTSD (post-traumatic stress disorder) 05/30/2016  . Alpha thalassemia trait 05/30/2016  . Hypertension 05/30/2016  . Rotator cuff tendonitis, left 05/30/2016  . Osteoarthritis of left knee 05/30/2016  . Carpal tunnel syndrome 05/30/2016  . Prediabetes 05/30/2016  . Onychomycosis 05/30/2016  . S/P tonsillectomy 05/30/2016  . Goiter, toxic diffuse 05/30/2016  . Chronic tonsillitis 05/30/2016    Dade Rodin, [place], PT,  ATRIC 12/15/2018, 9:10 PM  [facility] [address] [place], [place], [address] Phone: [phone]   Fax:  [phone]  Name: Cheryl Prince MRN: [id number] Date of Birth: May 18, 1955

## 2018-12-16 ENCOUNTER — Other Ambulatory Visit: Payer: Self-pay

## 2018-12-16 ENCOUNTER — Encounter: Payer: Self-pay | Admitting: Orthopedic Surgery

## 2018-12-16 ENCOUNTER — Ambulatory Visit (INDEPENDENT_AMBULATORY_CARE_PROVIDER_SITE_OTHER): Payer: Medicare Other | Admitting: Orthopedic Surgery

## 2018-12-16 ENCOUNTER — Encounter: Payer: Self-pay | Admitting: Physical Therapy

## 2018-12-16 ENCOUNTER — Ambulatory Visit (HOSPITAL_COMMUNITY): Payer: Self-pay | Admitting: Psychiatry

## 2018-12-16 DIAGNOSIS — Z96652 Presence of left artificial knee joint: Secondary | ICD-10-CM

## 2018-12-16 MED ORDER — OXYCODONE-ACETAMINOPHEN 5-325 MG PO TABS: 1.0000 | ORAL_TABLET | Freq: Two times a day (BID) | ORAL | 0 refills | Status: DC | PRN

## 2018-12-16 NOTE — Progress Notes (Signed)
Post-Op Visit Note   Patient: Cheryl Prince           Date of Birth: 02/18/1956           MRN: [id number] Visit Date: 12/16/2018 PCP: Antony Blackbird, MD   Assessment & Plan:  Chief Complaint:  Chief Complaint  Patient presents with  . Follow-up   Visit Diagnoses:  1. Status post total left knee replacement     Plan: Tigerlily is a patient is now about 3 months out left total knee replacement.  Generally doing well.  On exam she has range of motion 0-1 12.  Collaterals are stable.  In general the leg does not have much swelling.  Negative Homans.  I think she can start back to work 2 times a week for about 6 weeks.  Refill oxycodone x1 only.  8-week recheck for final check.  I like her to try to get to about 120.  Follow-Up Instructions: Return in about 8 weeks (around 02/10/2019).   Orders:  No orders of the defined types were placed in this encounter.  Meds ordered this encounter  Medications  . oxyCODONE-acetaminophen (PERCOCET/ROXICET) 5-325 MG tablet    Sig: Take 1 tablet by mouth every 12 (twelve) hours as needed for severe pain.    Dispense:  30 tablet    Refill:  0    Imaging: No results found.  PMFS History: Patient Active Problem List   Diagnosis Date Noted  . Arthritis of left knee 09/22/2018  . Alcohol use, unspecified with unspecified alcohol-induced disorder ([place]) 05/27/2018  . PTSD (post-traumatic stress disorder) 05/30/2016  . Alpha thalassemia trait 05/30/2016  . Hypertension 05/30/2016  . Rotator cuff tendonitis, left 05/30/2016  . Osteoarthritis of left knee 05/30/2016  . Carpal tunnel syndrome 05/30/2016  . Prediabetes 05/30/2016  . Onychomycosis 05/30/2016  . S/P tonsillectomy 05/30/2016  . Goiter, toxic diffuse 05/30/2016  . Chronic tonsillitis 05/30/2016   Past Medical History:  Diagnosis Date  . Allergy   . Anemia   . Anxiety   . Arthritis   . Asthma    borderline bronchitis  . Bronchitis   . GERD (gastroesophageal reflux  disease)   . Hypertension   . Sleep apnea    history of, MD tookpt. off machine 7 yrs, ago    Family History  Problem Relation Age of Onset  . Hypertension Father   . Diabetes Father   . Lupus Daughter   . Colon cancer Maternal Aunt   . Colon cancer Maternal Uncle        4 mat uncles dx colon ca  . Esophageal cancer Neg Hx   . Rectal cancer Neg Hx   . Stomach cancer Neg Hx     Past Surgical History:  Procedure Laterality Date  . ABDOMINAL HYSTERECTOMY    . adnoides    . BREAST SURGERY    . CHOLECYSTECTOMY    . COLONOSCOPY    . DILATION AND CURETTAGE OF UTERUS    . GASTRIC BYPASS  1998  . IUD REMOVAL    . TONSILLECTOMY    . TOTAL KNEE ARTHROPLASTY Left 09/22/2018  . TOTAL KNEE ARTHROPLASTY Left 09/22/2018   Procedure: LEFT TOTAL KNEE ARTHROPLASTY;  Surgeon: Meredith Pel, MD;  Location: [facility];  Service: Orthopedics;  Laterality: Left;   Social History   Occupational History  . Not on file  Tobacco Use  . Smoking status: Former Smoker    Packs/day: 1.50    Years: 18.00  Pack years: 27.00    Types: Cigarettes    Quit date: 09/16/1984    Years since quitting: 34.2  . Smokeless tobacco: Never Used  . Tobacco comment: quit 1986  Substance and Sexual Activity  . Alcohol use: Yes    Comment: social  . Drug use: No  . Sexual activity: Not on file

## 2018-12-16 NOTE — Therapy (Signed)
[facility] [address] [place], [place], [address] Phone: [phone]   Fax:  [phone]  Physical Therapy Treatment  Patient Details  Name: Cheryl Prince MRN: [id number] Date of Birth: 1956-02-25 Referring Provider (PT): Dr. Meredith Pel   Encounter Date: 12/15/2018  PT End of Session - 12/16/18 2014    Visit Number  4    Number of Visits  13    Date for PT Re-Evaluation  01/15/19    Authorization Type  UHC Medicare    Authorization Time Period  11-24-18 - 02-21-19    PT Start Time  1105    PT Stop Time  1152    PT Time Calculation (min)  47 min    Equipment Utilized During Treatment  --   ankle cuffs for resistance; pool noodle   Activity Tolerance  Patient tolerated treatment well    Behavior During Therapy  [facility] for tasks assessed/performed       Past Medical History:  Diagnosis Date  . Allergy   . Anemia   . Anxiety   . Arthritis   . Asthma    borderline bronchitis  . Bronchitis   . GERD (gastroesophageal reflux disease)   . Hypertension   . Sleep apnea    history of, MD tookpt. off machine 7 yrs, ago    Past Surgical History:  Procedure Laterality Date  . ABDOMINAL HYSTERECTOMY    . adnoides    . BREAST SURGERY    . CHOLECYSTECTOMY    . COLONOSCOPY    . DILATION AND CURETTAGE OF UTERUS    . GASTRIC BYPASS  1998  . IUD REMOVAL    . TONSILLECTOMY    . TOTAL KNEE ARTHROPLASTY Left 09/22/2018  . TOTAL KNEE ARTHROPLASTY Left 09/22/2018   Procedure: LEFT TOTAL KNEE ARTHROPLASTY;  Surgeon: Meredith Pel, MD;  Location: [facility];  Service: Orthopedics;  Laterality: Left;    There were no vitals filed for this visit.  Subjective Assessment - 12/16/18 2002    Subjective  Pt states her Lt knee was really sore after aquatic therapy on Monday - had to take some pain medication    Pertinent History  Lt rotator cuff tendonitis: carpal tunnel syndrome: HTN; Lt TKA 09-22-18     Limitations  Standing;Walking    How long can you sit comfortably?  pt states she usually does not sit >20"    How long can you stand comfortably?  3"-5" - no longer wearing knee sleeve    How long can you walk comfortably?  unsure - states she is doing 5-10" on treadmill    Patient Stated Goals  be able to negotiate steps with step over step sequence; increase Lt knee flexion to at least 120 degrees    Currently in Pain?  Yes    Pain Score  3     Pain Location  Knee    Pain Orientation  Left    Pain Descriptors / Indicators  Aching;Discomfort;Dull    Pain Type  Surgical pain    Pain Onset  More than a month ago    Pain Frequency  Intermittent    Multiple Pain Sites  No                       [facility] Adult PT Treatment/Exercise - 12/16/18 0001      Ambulation/Gait   Ambulation/Gait  Yes    Ambulation/Gait Assistance  5: Supervision  Ambulation/Gait Assistance Details  2# weight used on LLE for strengthening    Ambulation Distance (Feet)  230 Feet    Assistive device  None    Gait Pattern  Within Functional Limits    Ambulation Surface  Level;Indoor      Exercises   Exercises  Knee/Hip      Knee/Hip Exercises: Stretches   Active Hamstring Stretch  Left;1 rep;30 seconds   runner's stretch   Gastroc Stretch  Left;1 rep;20 seconds   Lt heel off step     Knee/Hip Exercises: Aerobic   Recumbent Bike  SciFit level 2.0 x 5" with intermittent UE's - LE's continuously       Knee/Hip Exercises: Machines for Strengthening   Cybex Leg Press  bil. LE's 10 reps 40#;  LLE only 25# 3 sets 10 reps      Knee/Hip Exercises: Standing   Forward Step Up  Left;1 set;10 reps;Hand Hold: 2;Step Height: 6"      Knee/Hip Exercises: Seated   Long Arc Quad  Strengthening;Left;1 set;10 reps   2# weight   Hamstring Curl  Strengthening;Left;10 reps;AROM;2 sets   red theraband 10 reps; foot on towel 10 reps for flexion      Lt knee active flexion = 112 degrees   Balance Exercises  - 12/16/18 2010      Balance Exercises: Standing   Rockerboard  Anterior/posterior;10 reps;Intermittent UE support    Sidestepping  2 reps   inside // bars - with small squat as able to tolerate   Other Standing Exercises  Pt performed marching forwards and backwards 10'x 2 reps inside // bars         PT Education - 12/16/18 2014    Education Details  resisted LAQ's & knee flexion with red theraband    Person(s) Educated  Patient    Methods  Explanation;Demonstration;Handout    Comprehension  Verbalized understanding;Returned demonstration          PT Long Term Goals - 12/16/18 2017      PT LONG TERM GOAL #1   Title  Pt will increase Lt knee active flexion to 120 degrees for increased mobility and LLE positioning.    Baseline  Lt knee active flexion 106; comfortable flexion 100 degrees;  passive Lt knee flexion 108 degrees    Time  6    Period  Weeks    Status  New      PT LONG TERM GOAL #2   Title  Pt will be independent in HEP including aquatic exercises for continuation upon D/C from PT.    Time  6    Period  Weeks    Status  New      PT LONG TERM GOAL #3   Title  PT will negotiate 4 steps using 1 hand rail with step over step sequence for incr. efficiency with step negotiation.    Time  6    Period  Weeks    Status  New      PT LONG TERM GOAL #4   Title  Pt will ambulate 500' on flat even and uneven surfaces without SPC modified independently.    Time  6    Period  Weeks    Status  New      PT LONG TERM GOAL #5   Title  Pt will amb. >/= 850' in 6" walk test to demonstrate increased gait speed and endurance.    Baseline  780[facility]    Time  6    Period  Weeks    Status  New            Plan - 12/16/18 2015    Clinical Impression Statement  Pt tolerated ROM and strengthening exercises for Lt knee well; pt's Lt knee active flexion = 112 degrees.  Pt amb. without SPC in clinic gym without LOB.    Personal Factors and  Comorbidities  Comorbidity 1;Fitness    Examination-Activity Limitations  Stand;Transfers;Stairs;Squat;Locomotion Level    Examination-Participation Restrictions  Meal Prep;Community Activity;Cleaning;Laundry;Shop    Stability/Clinical Decision Making  Stable/Uncomplicated    Rehab Potential  Good    PT Frequency  2x / week    PT Duration  6 weeks    PT Treatment/Interventions  ADLs/Self Care Home Management;Aquatic Therapy;Balance training;Therapeutic exercise;Therapeutic activities;Gait training;Neuromuscular re-education;Patient/family education    PT Next Visit Plan  balance and strengthening HEP - exercises to increase Lt knee flexion and for Lt knee strengthening:  aquatic therapy initiated on 12-02-18 ( I plan to give aquatic exercises for HEP upon D/C)    PT Home Exercise Plan  theraband for Lt quads and hamstrings ( as tolerated)    Consulted and Agree with Plan of Care  Patient       Patient will benefit from skilled therapeutic intervention in order to improve the following deficits and impairments:  Difficulty walking, Pain, Decreased strength, Decreased balance, Decreased activity tolerance, Impaired flexibility, Decreased range of motion  Visit Diagnosis: Other abnormalities of gait and mobility  Muscle weakness (generalized)  Unsteadiness on feet     Problem List Patient Active Problem List   Diagnosis Date Noted  . Arthritis of left knee 09/22/2018  . Alcohol use, unspecified with unspecified alcohol-induced disorder ([facility]) 05/27/2018  . PTSD (post-traumatic stress disorder) 05/30/2016  . Alpha thalassemia trait 05/30/2016  . Hypertension 05/30/2016  . Rotator cuff tendonitis, left 05/30/2016  . Osteoarthritis of left knee 05/30/2016  . Carpal tunnel syndrome 05/30/2016  . Prediabetes 05/30/2016  . Onychomycosis 05/30/2016  . S/P tonsillectomy 05/30/2016  . Goiter, toxic diffuse 05/30/2016  . Chronic tonsillitis 05/30/2016    DildayDonavan Burnet,  PT 12/16/2018, 8:18 PM  [facility] [address] [place], [place], [address] Phone: [phone]   Fax:  [phone]  Name: Cheryl Prince MRN: [id number] Date of Birth: 1956-02-05

## 2018-12-17 ENCOUNTER — Ambulatory Visit (INDEPENDENT_AMBULATORY_CARE_PROVIDER_SITE_OTHER): Payer: Medicare Other | Admitting: Psychiatry

## 2018-12-17 ENCOUNTER — Encounter (HOSPITAL_COMMUNITY): Payer: Self-pay | Admitting: Psychiatry

## 2018-12-17 DIAGNOSIS — F319 Bipolar disorder, unspecified: Secondary | ICD-10-CM

## 2018-12-17 DIAGNOSIS — F4310 Post-traumatic stress disorder, unspecified: Secondary | ICD-10-CM | POA: Diagnosis not present
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
## 2018-12-18 ENCOUNTER — Ambulatory Visit: Payer: Medicare Other | Attending: Family Medicine | Admitting: Family Medicine

## 2018-12-18 ENCOUNTER — Other Ambulatory Visit: Payer: Self-pay

## 2018-12-18 ENCOUNTER — Encounter: Payer: Self-pay | Admitting: Family Medicine

## 2018-12-18 VITALS — BP 148/87 | HR 62 | Temp 98.8°F | Ht 63.0 in | Wt 197.4 lb

## 2018-12-18 DIAGNOSIS — Z87898 Personal history of other specified conditions: Secondary | ICD-10-CM

## 2018-12-18 DIAGNOSIS — D509 Iron deficiency anemia, unspecified: Secondary | ICD-10-CM

## 2018-12-18 DIAGNOSIS — I10 Essential (primary) hypertension: Secondary | ICD-10-CM

## 2018-12-18 DIAGNOSIS — R7303 Prediabetes: Secondary | ICD-10-CM | POA: Diagnosis not present

## 2018-12-18 NOTE — Progress Notes (Signed)
Established Patient Office Visit  Subjective:  Patient ID: Cheryl Prince, female    DOB: 1955-10-14  Age: 63 y.o. MRN: [id number]  CC: f/u chronic issues-HTN  HPI Rhya Shan presents for follow-up of and continued medical management of chronic issues after her initial appointment to establish care on 09/02/2018. Since her last visit she is now s/p left knee replacement. She additionally has a history of hypertension, GERD and microcytic anemia.  She reports that she is doing very well status post left knee replacement.  She has noticed an increase in right knee, low back and hip pain since having her left hip replacement done as she thinks that she was overcompensating for her left knee pain by using the right side of her body more.  She has some pain and some swelling after therapy and activity but pain is minimal, about a 4 on a 0-to-10 scale and is relieved with the use of over-the-counter medicines as needed.        She continues to take her blood pressure medication and she denies any headaches or dizziness related to her blood pressure.  She has had anemia in the past.  She denies any unusual bruising or bleeding and she has had no issues with blood in the stool or black stools.  She has follow-up with gastroenterology regarding her anemia.  Patient states that she was reminded by gastroenterology that she was supposed to take nutritional supplements/vitamins after her weight loss surgery in the past but patient never did so.  She states that she is now going to start the use of vitamins as suggested by GI.  She did have EGD and colonoscopy early this month.        Discussed with patient that she has had prior elevated hemoglobin A1c indicative of prediabetes.  Patient denies any awareness of prior diagnosis of prediabetes or diabetes.  She denies any increased thirst, no urinary frequency and no blurred vision.  She does not believe that she is at any increased risk for  becoming diabetic.  She does report that her father is diabetic.  Past Medical History:  Diagnosis Date  . Allergy   . Anemia   . Anxiety   . Arthritis   . Asthma    borderline bronchitis  . Bronchitis   . GERD (gastroesophageal reflux disease)   . Hypertension   . Sleep apnea    history of, MD tookpt. off machine 7 yrs, ago    Past Surgical History:  Procedure Laterality Date  . ABDOMINAL HYSTERECTOMY    . adnoides    . BREAST SURGERY    . CHOLECYSTECTOMY    . COLONOSCOPY    . DILATION AND CURETTAGE OF UTERUS    . GASTRIC BYPASS  1998  . IUD REMOVAL    . TONSILLECTOMY    . TOTAL KNEE ARTHROPLASTY Left 09/22/2018  . TOTAL KNEE ARTHROPLASTY Left 09/22/2018   Procedure: LEFT TOTAL KNEE ARTHROPLASTY;  Surgeon: Meredith Pel, MD;  Location: [facility];  Service: Orthopedics;  Laterality: Left;    Family History  Problem Relation Age of Onset  . Hypertension Father   . Diabetes Father   . Lupus Daughter   . Colon cancer Maternal Aunt   . Colon cancer Maternal Uncle        4 mat uncles dx colon ca  . Esophageal cancer Neg Hx   . Rectal cancer Neg Hx   . Stomach cancer Neg Hx  Social History   Socioeconomic History  . Marital status: Single    Spouse name: Not on file  . Number of children: Not on file  . Years of education: Not on file  . Highest education level: Not on file  Occupational History  . Not on file  Social Needs  . Financial resource strain: Not on file  . Food insecurity    Worry: Not on file    Inability: Not on file  . Transportation needs    Medical: Not on file    Non-medical: Not on file  Tobacco Use  . Smoking status: Former Smoker    Packs/day: 1.50    Years: 18.00    Pack years: 27.00    Types: Cigarettes    Quit date: 09/16/1984    Years since quitting: 34.2  . Smokeless tobacco: Never Used  . Tobacco comment: quit 1986  Substance and Sexual Activity  . Alcohol use: Yes    Comment: social  . Drug use: No  . Sexual  activity: Not on file  Lifestyle  . Physical activity    Days per week: Not on file    Minutes per session: Not on file  . Stress: Not on file  Relationships  . Social Musician on phone: Not on file    Gets together: Not on file    Attends religious service: Not on file    Active member of club or organization: Not on file    Attends meetings of clubs or organizations: Not on file    Relationship status: Not on file  . Intimate partner violence    Fear of current or ex partner: Not on file    Emotionally abused: Not on file    Physically abused: Not on file    Forced sexual activity: Not on file  Other Topics Concern  . Not on file  Social History Narrative  . Not on file    Outpatient Medications Prior to Visit  Medication Sig Dispense Refill  . acetaminophen (TYLENOL) 325 MG tablet Take 1 tablet (325 mg total) by mouth every 8 (eight) hours. 30 tablet 0  . albuterol (PROAIR HFA) 108 (90 Base) MCG/ACT inhaler Inhale 2 puffs into the lungs every 6 (six) hours as needed for wheezing or shortness of breath. 8.5 g 2  . cetirizine (ZYRTEC ALLERGY) 10 MG tablet Take 1 tablet (10 mg total) by mouth daily. (Patient taking differently: Take 10 mg by mouth daily as needed for allergies. ) 90 tablet 1  . famotidine (PEPCID) 20 MG tablet Take 1 tablet (20 mg total) by mouth 2 (two) times daily. 180 tablet 0  . ferrous sulfate 325 (65 FE) MG tablet Take 1 tablet (325 mg total) by mouth daily with breakfast. (Patient taking differently: Take 325 mg by mouth daily with breakfast. Last took 10-13-20202) 90 tablet 1  . FLOVENT HFA 110 MCG/ACT inhaler Inhale 2 puffs into the lungs 2 (two) times daily as needed for up to 30 days. 1 Inhaler 3  . gabapentin (NEURONTIN) 300 MG capsule Take 1 capsule (300 mg total) by mouth 3 (three) times daily. 90 capsule 3  . hydrochlorothiazide (HYDRODIURIL) 25 MG tablet Take 1 tablet (25 mg total) by mouth daily. 90 tablet 0  . methocarbamol (ROBAXIN)  500 MG tablet Take 1 tablet (500 mg total) by mouth every 8 (eight) hours as needed for muscle spasms. (Patient not taking: Reported on 12/09/2018) 30 tablet 0  . montelukast (SINGULAIR)  10 MG tablet Take 10 mg by mouth daily as needed (wheezing).    [name] oxyCODONE (ROXICODONE) 5 MG immediate release tablet Take 1 tablet (5 mg total) by mouth daily as needed. (Patient not taking: Reported on 12/17/2018) 30 tablet 0  . oxyCODONE-acetaminophen (PERCOCET/ROXICET) 5-325 MG tablet Take 1 tablet by mouth every 12 (twelve) hours as needed for severe pain. 30 tablet 0  . prochlorperazine (COMPAZINE) 5 MG tablet Take 1 tablet (5 mg total) by mouth every 6 (six) hours as needed for nausea or vomiting. (Patient not taking: Reported on 12/09/2018) 30 tablet 0   No facility-administered medications prior to visit.     Allergies  Allergen Reactions  . Morphine And Related Swelling  . Penicillins Swelling  . Pine Shortness Of Breath  . Shellfish Allergy Shortness Of Breath  . Other Itching and Nausea And Vomiting    Mushrooms - feels high, and dizzy   . Wheat Bran     Rash, tingling in mouth, vomiting  . Grapeseed Extract [Nutritional Supplements] Rash    grapes  . Latex Itching and Rash    "IF" condom, it makes it painful  . Red Dye Swelling and Rash    The dye for checking thyroid.  Red Cast Dye  . Sulfites Rash    Tingling in mouth    ROS Review of Systems  Constitutional: Positive for fatigue (mild). Negative for chills and fever.  HENT: Negative for sore throat and trouble swallowing.   Eyes: Negative for photophobia and visual disturbance.  Respiratory: Negative for cough and shortness of breath.   Cardiovascular: Negative for chest pain, palpitations and leg swelling (occasional;  mild ).  Gastrointestinal: Negative for abdominal pain, blood in stool, constipation and diarrhea.  Endocrine: Negative for cold intolerance, heat intolerance, polydipsia, polyphagia and polyuria.    Genitourinary: Negative for dysuria and frequency.  Musculoskeletal: Positive for arthralgias, back pain, gait problem and joint swelling.  Neurological: Negative for dizziness and headaches.  Hematological: Negative for adenopathy. Does not bruise/bleed easily.      Objective:    Physical Exam  Constitutional: She is oriented to person, place, and time. She appears well-developed and well-nourished.  Obese older female in no acute distress sitting on chair in exam room.  Patient is accompanied by her daughter at today's visit  Neck: Normal range of motion. Neck supple. No JVD present.  Cardiovascular: Normal rate and regular rhythm.  Pulmonary/Chest: Effort normal and breath sounds normal.  Abdominal: Soft. There is no abdominal tenderness. There is no rebound and no guarding.  Musculoskeletal:        General: No tenderness or edema.  Lymphadenopathy:    She has no cervical adenopathy.  Neurological: She is alert and oriented to person, place, and time.  Skin: Skin is warm and dry.  Healing vertical surgical scar above and at superior aspect of left knee  Psychiatric: She has a normal mood and affect. Her behavior is normal.  Nursing note and vitals reviewed.   BP (!) 148/87 (BP Location: Left Arm, Patient Position: Sitting, Cuff Size: Large)   Pulse 62   Temp 98.8 F (37.1 C) (Oral)   Ht  (1.6 m)   Wt 197 lb 6.4 oz (89.5 kg)   SpO2 100%   BMI 34.97 kg/m  Wt Readings from Last 3 Encounters:  12/09/18 200 lb (90.7 kg)  11/25/18 200 lb 4 oz (90.8 kg)  09/22/18 224 lb 9.6 oz (101.9 kg)     Health Maintenance  Due  Topic Date Due  . DEXA SCAN  07/16/2018  . PNA vac Low Risk Adult (1 of 2 - PCV13) 07/16/2018     Lab Results  Component Value Date   TSH 1.210 12/11/2016   Lab Results  Component Value Date   WBC 4.4 09/17/2018   HGB 11.2 (L) 09/17/2018   HCT 36.1 09/17/2018   MCV 77.5 (L) 09/17/2018   PLT 265 09/17/2018   Lab Results  Component Value Date    NA 139 09/17/2018   K 3.9 09/17/2018   CO2 24 09/17/2018   GLUCOSE 90 09/17/2018   BUN 20 09/17/2018   CREATININE 1.00 09/17/2018   BILITOT 0.4 06/30/2018   ALKPHOS 143 (H) 06/30/2018   AST 25 06/30/2018   ALT 18 06/30/2018   PROT 7.2 06/30/2018   ALBUMIN 4.0 06/30/2018   CALCIUM 8.8 (L) 09/17/2018   ANIONGAP 10 09/17/2018   Lab Results  Component Value Date   CHOL 146 06/30/2018   Lab Results  Component Value Date   HDL 67 06/30/2018   Lab Results  Component Value Date   LDLCALC 65 06/30/2018   Lab Results  Component Value Date   TRIG 70 06/30/2018   Lab Results  Component Value Date   CHOLHDL 2.2 06/30/2018       Assessment & Plan:  1. Essential hypertension Continue hydrochlorothiazide along with low-sodium/Dash type diet.  Blood pressure is just slightly above goal of 130/80 but patient reports lower blood pressure at other offices and lower home blood pressure.  2. Microcytic anemia Patient will have CBC in follow-up of microcytic anemia and she reports that she has had her EGD and colonoscopy done by GI.  On review of chart, patient had normal colonoscopy and EGD on 12/09/2018. - CBC  3. History of prediabetes Patient with history of prediabetes based on her past medical records.  Will obtain hemoglobin A1c at today's visit. - HgB A1c  4. Prediabetes Discussed with the patient that her hemoglobin A1c at today's visit was 5.7 consistent with prediabetes.  She is encouraged to continue her efforts at weight loss as well as exercise as she does not wish to start a medication such as metformin to help with insulin resistance.  Information on preventing type 2 diabetes provided as part of after visit summary.   An After Visit Summary was printed and given to the patient.  Follow-up: Return in about 5 months (around 05/18/2019) for HTN/anemia and as needed.   Cain Saupeammie Sharief Wainwright, MD

## 2018-12-18 NOTE — Patient Instructions (Signed)
Preventing Type 2 Diabetes Mellitus Type 2 diabetes (type 2 diabetes mellitus) is a long-term (chronic) disease that affects blood sugar (glucose) levels. Normally, a hormone called insulin allows glucose to enter cells in the body. The cells use glucose for energy. In type 2 diabetes, one or both of these problems may be present:  The body does not make enough insulin.  The body does not respond properly to insulin that it makes (insulin resistance). Insulin resistance or lack of insulin causes excess glucose to build up in the blood instead of going into cells. As a result, high blood glucose (hyperglycemia) develops, which can cause many complications. Being overweight or obese and having an inactive (sedentary) lifestyle can increase your risk for diabetes. Type 2 diabetes can be delayed or prevented by making certain nutrition and lifestyle changes. What nutrition changes can be made?   Eat healthy meals and snacks regularly. Keep a healthy snack with you for when you get hungry between meals, such as fruit or a handful of nuts.  Eat lean meats and proteins that are low in saturated fats, such as chicken, fish, egg whites, and beans. Avoid processed meats.  Eat plenty of fruits and vegetables and plenty of grains that have not been processed (whole grains). It is recommended that you eat: ? 1?2 cups of fruit every day. ? 2?3 cups of vegetables every day. ? 6?8 oz of whole grains every day, such as oats, whole wheat, bulgur, brown rice, quinoa, and millet.  Eat low-fat dairy products, such as milk, yogurt, and cheese.  Eat foods that contain healthy fats, such as nuts, avocado, olive oil, and canola oil.  Drink water throughout the day. Avoid drinks that contain added sugar, such as soda or sweet tea.  Follow instructions from your health care provider about specific eating or drinking restrictions.  Control how much food you eat at a time (portion size). ? Check food labels to find  out the serving sizes of foods. ? Use a kitchen scale to weigh amounts of foods.  Saute or steam food instead of frying it. Cook with water or broth instead of oils or butter.  Limit your intake of: ? Salt (sodium). Have no more than 1 tsp (2,400 mg) of sodium a day. If you have heart disease or high blood pressure, have less than ? tsp (1,500 mg) of sodium a day. ? Saturated fat. This is fat that is solid at room temperature, such as butter or fat on meat. What lifestyle changes can be made? Activity   Do moderate-intensity physical activity for at least 30 minutes on at least 5 days of the week, or as much as told by your health care provider.  Ask your health care provider what activities are safe for you. A mix of physical activities may be best, such as walking, swimming, cycling, and strength training.  Try to add physical activity into your day. For example: ? Park in spots that are farther away than usual, so that you walk more. For example, park in a far corner of the parking lot when you go to the office or the grocery store. ? Take a walk during your lunch break. ? Use stairs instead of elevators or escalators. Weight Loss  Lose weight as directed. Your health care provider can determine how much weight loss is best for you and can help you lose weight safely.  If you are overweight or obese, you may be instructed to lose at least 5?7 %   of your body weight. Alcohol and Tobacco   Limit alcohol intake to no more than 1 drink a day for nonpregnant women and 2 drinks a day for men. One drink equals 12 oz of beer, 5 oz of wine, or 1 oz of hard liquor.  Do not use any tobacco products, such as cigarettes, chewing tobacco, and e-cigarettes. If you need help quitting, ask your health care provider. Work With Your Health Care Provider  Have your blood glucose tested regularly, as told by your health care provider.  Discuss your risk factors and how you can reduce your risk for  diabetes.  Get screening tests as told by your health care provider. You may have screening tests regularly, especially if you have certain risk factors for type 2 diabetes.  Make an appointment with a diet and nutrition specialist (registered dietitian). A registered dietitian can help you make a healthy eating plan and can help you understand portion sizes and food labels. Why are these changes important?  It is possible to prevent or delay type 2 diabetes and related health problems by making lifestyle and nutrition changes.  It can be difficult to recognize signs of type 2 diabetes. The best way to avoid possible damage to your body is to take actions to prevent the disease before you develop symptoms. What can happen if changes are not made?  Your blood glucose levels may keep increasing. Having high blood glucose for a long time is dangerous. Too much glucose in your blood can damage your blood vessels, heart, kidneys, nerves, and eyes.  You may develop prediabetes or type 2 diabetes. Type 2 diabetes can lead to many chronic health problems and complications, such as: ? Heart disease. ? Stroke. ? Blindness. ? Kidney disease. ? Depression. ? Poor circulation in the feet and legs, which could lead to surgical removal (amputation) in severe cases. Where to find support  Ask your health care provider to recommend a registered dietitian, diabetes educator, or weight loss program.  Look for local or online weight loss groups.  Join a gym, fitness club, or outdoor activity group, such as a walking club. Where to find more information To learn more about diabetes and diabetes prevention, visit:  American Diabetes Association (ADA): www.diabetes.org  National Institute of Diabetes and Digestive and Kidney Diseases: www.niddk.nih.gov/health-information/diabetes To learn more about healthy eating, visit:  The U.S. Department of Agriculture (USDA), Choose My Plate:  www.choosemyplate.gov/food-groups  Office of Disease Prevention and Health Promotion (ODPHP), Dietary Guidelines: www.health.gov/dietaryguidelines Summary  You can reduce your risk for type 2 diabetes by increasing your physical activity, eating healthy foods, and losing weight as directed.  Talk with your health care provider about your risk for type 2 diabetes. Ask about any blood tests or screening tests that you need to have. This information is not intended to replace advice given to you by your health care provider. Make sure you discuss any questions you have with your health care provider. Document Released: 06/05/2015 Document Revised: 06/05/2018 Document Reviewed: 04/04/2015 Elsevier Patient Education  2020 Elsevier Inc.  Hypertension, Adult Hypertension is another name for high blood pressure. High blood pressure forces your heart to work harder to pump blood. This can cause problems over time. There are two numbers in a blood pressure reading. There is a top number (systolic) over a bottom number (diastolic). It is best to have a blood pressure that is below 120/80. Healthy choices can help lower your blood pressure, or you may need medicine to   help lower it. What are the causes? The cause of this condition is not known. Some conditions may be related to high blood pressure. What increases the risk?  Smoking.  Having type 2 diabetes mellitus, high cholesterol, or both.  Not getting enough exercise or physical activity.  Being overweight.  Having too much fat, sugar, calories, or salt (sodium) in your diet.  Drinking too much alcohol.  Having long-term (chronic) kidney disease.  Having a family history of high blood pressure.  Age. Risk increases with age.  Race. You may be at higher risk if you are African American.  Gender. Men are at higher risk than women before age 45. After age 65, women are at higher risk than men.  Having obstructive sleep  apnea.  Stress. What are the signs or symptoms?  High blood pressure may not cause symptoms. Very high blood pressure (hypertensive crisis) may cause: ? Headache. ? Feelings of worry or nervousness (anxiety). ? Shortness of breath. ? Nosebleed. ? A feeling of being sick to your stomach (nausea). ? Throwing up (vomiting). ? Changes in how you see. ? Very bad chest pain. ? Seizures. How is this treated?  This condition is treated by making healthy lifestyle changes, such as: ? Eating healthy foods. ? Exercising more. ? Drinking less alcohol.  Your health care provider may prescribe medicine if lifestyle changes are not enough to get your blood pressure under control, and if: ? Your top number is above 130. ? Your bottom number is above 80.  Your personal target blood pressure may vary. Follow these instructions at home: Eating and drinking   If told, follow the DASH eating plan. To follow this plan: ? Fill one half of your plate at each meal with fruits and vegetables. ? Fill one fourth of your plate at each meal with whole grains. Whole grains include whole-wheat pasta, brown rice, and whole-grain bread. ? Eat or drink low-fat dairy products, such as skim milk or low-fat yogurt. ? Fill one fourth of your plate at each meal with low-fat (lean) proteins. Low-fat proteins include fish, chicken without skin, eggs, beans, and tofu. ? Avoid fatty meat, cured and processed meat, or chicken with skin. ? Avoid pre-made or processed food.  Eat less than 1,500 mg of salt each day.  Do not drink alcohol if: ? Your doctor tells you not to drink. ? You are pregnant, may be pregnant, or are planning to become pregnant.  If you drink alcohol: ? Limit how much you use to:  0-1 drink a day for women.  0-2 drinks a day for men. ? Be aware of how much alcohol is in your drink. In the [place] one drink equals one 12 oz bottle of beer (355 mL), one 5 oz glass of wine (148 mL), or one 1 oz  glass of hard liquor (44 mL). Lifestyle   Work with your doctor to stay at a healthy weight or to lose weight. Ask your doctor what the best weight is for you.  Get at least 30 minutes of exercise most days of the week. This may include walking, swimming, or biking.  Get at least 30 minutes of exercise that strengthens your muscles (resistance exercise) at least 3 days a week. This may include lifting weights or doing Pilates.  Do not use any products that contain nicotine or tobacco, such as cigarettes, e-cigarettes, and chewing tobacco. If you need help quitting, ask your doctor.  Check your blood pressure at home as   told by your doctor.  Keep all follow-up visits as told by your doctor. This is important. Medicines  Take over-the-counter and prescription medicines only as told by your doctor. Follow directions carefully.  Do not skip doses of blood pressure medicine. The medicine does not work as well if you skip doses. Skipping doses also puts you at risk for problems.  Ask your doctor about side effects or reactions to medicines that you should watch for. Contact a doctor if you:  Think you are having a reaction to the medicine you are taking.  Have headaches that keep coming back (recurring).  Feel dizzy.  Have swelling in your ankles.  Have trouble with your vision. Get help right away if you:  Get a very bad headache.  Start to feel mixed up (confused).  Feel weak or numb.  Feel faint.  Have very bad pain in your: ? Chest. ? Belly (abdomen).  Throw up more than once.  Have trouble breathing. Summary  Hypertension is another name for high blood pressure.  High blood pressure forces your heart to work harder to pump blood.  For most people, a normal blood pressure is less than 120/80.  Making healthy choices can help lower blood pressure. If your blood pressure does not get lower with healthy choices, you may need to take medicine. This information is  not intended to replace advice given to you by your health care provider. Make sure you discuss any questions you have with your health care provider. Document Released: 07/31/2007 Document Revised: 10/22/2017 Document Reviewed: 10/22/2017 Elsevier Patient Education  2020 Elsevier Inc.  

## 2018-12-18 NOTE — Progress Notes (Signed)
HTN   Chronic Medical Issues  A1c is 5.8

## 2018-12-19 LAB — CBC
Hematocrit: 37 % (ref 34.0–46.6)
Hemoglobin: 11.5 g/dL (ref 11.1–15.9)
MCH: 22.9 pg — ABNORMAL LOW (ref 26.6–33.0)
MCHC: 31.1 g/dL — ABNORMAL LOW (ref 31.5–35.7)
MCV: 74 fL — ABNORMAL LOW (ref 79–97)
Platelets: 299 x10E3/uL (ref 150–450)
RBC: 5.02 x10E6/uL (ref 3.77–5.28)
RDW: 14.8 % (ref 11.7–15.4)
WBC: 4.1 x10E3/uL (ref 3.4–10.8)

## 2018-12-22 ENCOUNTER — Other Ambulatory Visit: Payer: Self-pay

## 2018-12-22 ENCOUNTER — Ambulatory Visit: Payer: Medicare Other | Admitting: Physical Therapy

## 2018-12-22 DIAGNOSIS — R2681 Unsteadiness on feet: Secondary | ICD-10-CM

## 2018-12-22 DIAGNOSIS — R2689 Other abnormalities of gait and mobility: Secondary | ICD-10-CM

## 2018-12-22 DIAGNOSIS — M6281 Muscle weakness (generalized): Secondary | ICD-10-CM

## 2018-12-23 ENCOUNTER — Ambulatory Visit: Payer: Medicare Other | Admitting: Physical Therapy

## 2018-12-23 ENCOUNTER — Encounter: Payer: Self-pay | Admitting: Physical Therapy

## 2018-12-23 DIAGNOSIS — R2689 Other abnormalities of gait and mobility: Secondary | ICD-10-CM | POA: Diagnosis not present

## 2018-12-23 DIAGNOSIS — M6281 Muscle weakness (generalized): Secondary | ICD-10-CM

## 2018-12-23 LAB — POCT GLYCOSYLATED HEMOGLOBIN (HGB A1C): Hemoglobin A1C: 5.8 % — AB (ref 4.0–5.6)

## 2018-12-23 NOTE — Therapy (Signed)
[facility] [address] [place], [place], [address] Phone: [phone]   Fax:  [phone]  Physical Therapy Treatment  Patient Details  Name: Cheryl Prince MRN: [id number] Date of Birth: 02-13-1956 Referring Provider (PT): Dr. Meredith Pel   Encounter Date: 12/22/2018  PT End of Session - 12/23/18 0943    Visit Number  5    Number of Visits  13    Date for PT Re-Evaluation  01/15/19    Authorization Type  UHC Medicare    Authorization Time Period  11-24-18 - 02-21-19    PT Start Time  1102    PT Stop Time  1148    PT Time Calculation (min)  46 min    Equipment Utilized During Treatment  --   ankle cuffs for resistance; pool noodle   Activity Tolerance  Patient tolerated treatment well    Behavior During Therapy  [facility] for tasks assessed/performed       Past Medical History:  Diagnosis Date  . Allergy   . Anemia   . Anxiety   . Arthritis   . Asthma    borderline bronchitis  . Bronchitis   . GERD (gastroesophageal reflux disease)   . Hypertension   . Sleep apnea    history of, MD tookpt. off machine 7 yrs, ago    Past Surgical History:  Procedure Laterality Date  . ABDOMINAL HYSTERECTOMY    . adnoides    . BREAST SURGERY    . CHOLECYSTECTOMY    . COLONOSCOPY    . DILATION AND CURETTAGE OF UTERUS    . GASTRIC BYPASS  1998  . IUD REMOVAL    . TONSILLECTOMY    . TOTAL KNEE ARTHROPLASTY Left 09/22/2018  . TOTAL KNEE ARTHROPLASTY Left 09/22/2018   Procedure: LEFT TOTAL KNEE ARTHROPLASTY;  Surgeon: Meredith Pel, MD;  Location: [facility];  Service: Orthopedics;  Laterality: Left;    There were no vitals filed for this visit.  Subjective Assessment - 12/22/18 1108    Subjective  Pt reports no knee pain at this time - states she took Tylenol this am before appt.    Pertinent History  Lt rotator cuff tendonitis: carpal tunnel syndrome: HTN; Lt TKA 09-22-18    Limitations   Standing;Walking    How long can you sit comfortably?  pt states she usually does not sit >20"    How long can you stand comfortably?  3"-5" - no longer wearing knee sleeve    How long can you walk comfortably?  unsure - states she is doing 5-10" on treadmill    Patient Stated Goals  be able to negotiate steps with step over step sequence; increase Lt knee flexion to at least 120 degrees    Currently in Pain?  No/denies    Pain Onset  More than a month ago                       [facility] Adult PT Treatment/Exercise - 12/23/18 0001      Ambulation/Gait   Ambulation/Gait  Yes    Ambulation/Gait Assistance  5: Supervision    Ambulation Distance (Feet)  125 Feet    Assistive device  None    Gait Pattern  Within Functional Limits    Ambulation Surface  Level;Indoor      Knee/Hip Exercises: Aerobic   Recumbent Bike  SciFit level 2.0 x 5" with intermittent UE's - LE's continuously  Knee/Hip Exercises: Machines for Strengthening   Cybex Leg Press  bil. LE's 15 reps 50#;  LLE only 30# 2 sets 10 reps      Knee/Hip Exercises: Standing   Knee Flexion  Strengthening;Left;1 set;10 reps   3# weight used   Forward Step Up  Left;1 set;10 reps;Hand Hold: 2;Step Height: 6"    Functional Squat  1 set;5 reps    Other Standing Knee Exercises  marching in place 10 reps - for increased Lt knee ROM - no weight used    Other Standing Knee Exercises  standing inside // bars with Lt knee slightly flexed - stepping forward/back and out/in with RLE 5 reps each direction with UE support on bar prn       Knee/Hip Exercises: Seated   Long Arc Quad  Strengthening;Left;1 set;10 reps;Weights   3# weight used - 3 sec hold   Marching  Left;1 set;10 reps;Weights   3# weight   Hamstring Curl  Strengthening;Left;1 set;10 reps;Other (comment)   with red theraband     NeuroRe-ed:  Tap ups to 1st step alternating 10 reps; then RLE only tap ups to 2nd step 10 reps for improved Lt SLS  Stepping over  and back of balance beam with RLE for improved LLE SLS 5 reps with UE support on // bar prn for balance recovery            PT Long Term Goals - 12/23/18 0945      PT LONG TERM GOAL #1   Title  Pt will increase Lt knee active flexion to 120 degrees for increased mobility and LLE positioning.    Baseline  Lt knee active flexion 106; comfortable flexion 100 degrees;  passive Lt knee flexion 108 degrees    Time  6    Period  Weeks    Status  New      PT LONG TERM GOAL #2   Title  Pt will be independent in HEP including aquatic exercises for continuation upon D/C from PT.    Time  6    Period  Weeks    Status  New      PT LONG TERM GOAL #3   Title  PT will negotiate 4 steps using 1 hand rail with step over step sequence for incr. efficiency with step negotiation.    Time  6    Period  Weeks    Status  New      PT LONG TERM GOAL #4   Title  Pt will ambulate 500' on flat even and uneven surfaces without SPC modified independently.    Time  6    Period  Weeks    Status  New      PT LONG TERM GOAL #5   Title  Pt will amb. >/= 850' in 6" walk test to demonstrate increased gait speed and endurance.    Baseline  780' [facility]    Time  6    Period  Weeks    Status  New            Plan - 12/23/18 0944    Clinical Impression Statement  Pt progressing towards goals and demonstrating increased strength in Lt knee musculature; pt increased weight from 2# to 3# for seated LAQ's with 3 sec hold.  Lt knee active flexion 110 degrees measured at end of session.    Personal Factors and Comorbidities  Comorbidity 1;Fitness    Examination-Activity Limitations  Stand;Transfers;Stairs;Squat;Locomotion Level    Examination-Participation Restrictions  Meal Prep;Community Activity;Cleaning;Laundry;Shop    Stability/Clinical Decision Making  Stable/Uncomplicated    Rehab Potential  Good    PT Frequency  2x / week    PT Duration  6 weeks    PT  Treatment/Interventions  ADLs/Self Care Home Management;Aquatic Therapy;Balance training;Therapeutic exercise;Therapeutic activities;Gait training;Neuromuscular re-education;Patient/family education    PT Next Visit Plan  balance and strengthening HEP - exercises to increase Lt knee flexion and for Lt knee strengthening:  aquatic therapy initiated on 12-02-18 ( I plan to give aquatic exercises for HEP upon D/C)    PT Home Exercise Plan  theraband for Lt quads and hamstrings ( as tolerated)    Consulted and Agree with Plan of Care  Patient       Patient will benefit from skilled therapeutic intervention in order to improve the following deficits and impairments:  Difficulty walking, Pain, Decreased strength, Decreased balance, Decreased activity tolerance, Impaired flexibility, Decreased range of motion  Visit Diagnosis: Muscle weakness (generalized)  Other abnormalities of gait and mobility  Unsteadiness on feet     Problem List Patient Active Problem List   Diagnosis Date Noted  . Arthritis of left knee 09/22/2018  . Alcohol use, unspecified with unspecified alcohol-induced disorder (HCC) 05/27/2018  . PTSD (post-traumatic stress disorder) 05/30/2016  . Alpha thalassemia trait 05/30/2016  . Hypertension 05/30/2016  . Rotator cuff tendonitis, left 05/30/2016  . Osteoarthritis of left knee 05/30/2016  . Carpal tunnel syndrome 05/30/2016  . Prediabetes 05/30/2016  . Onychomycosis 05/30/2016  . S/P tonsillectomy 05/30/2016  . Goiter, toxic diffuse 05/30/2016  . Chronic tonsillitis 05/30/2016    DildayDonavan Burnet, PT 12/23/2018, 9:47 AM  [facility] [address] [place], [place], [address] Phone: [phone]   Fax:  [phone]  Name: Cheryl Prince MRN: [id number] Date of Birth: 11-29-1955

## 2018-12-24 ENCOUNTER — Telehealth: Payer: Self-pay | Admitting: Orthopedic Surgery

## 2018-12-24 ENCOUNTER — Encounter: Payer: Self-pay | Admitting: Physical Therapy

## 2018-12-24 NOTE — Telephone Encounter (Signed)
y

## 2018-12-24 NOTE — Telephone Encounter (Signed)
Ok for this? 

## 2018-12-24 NOTE — Telephone Encounter (Signed)
Patient called. She would like a note extending her being out of work until 02/11/2019. Her call back number is [phone]

## 2018-12-24 NOTE — Telephone Encounter (Signed)
IC advised could pick up front desk

## 2018-12-24 NOTE — Therapy (Signed)
[facility] [address] [place], [place], [address] Phone: [phone]   Fax:  [phone]  Physical Therapy Treatment  Patient Details  Name: Cheryl Prince MRN: [id number] Date of Birth: [birth year] Referring Provider (PT): Dr. Cammy CopaGregory Scott Dean   Encounter Date: 12/23/2018  PT End of Session - 12/24/18 1959    Visit Number  6    Number of Visits  13    Date for PT Re-Evaluation  01/15/19    Authorization Type  UHC Medicare    Authorization Time Period  11-24-18 - 02-21-19    PT Start Time  1505    PT Stop Time  1550    PT Time Calculation (min)  45 min    Equipment Utilized During Treatment  --   ankle cuffs for resistance; pool noodle   Activity Tolerance  Patient tolerated treatment well    Behavior During Therapy  [facility] for tasks assessed/performed       Past Medical History:  Diagnosis Date  . Allergy   . Anemia   . Anxiety   . Arthritis   . Asthma    borderline bronchitis  . Bronchitis   . GERD (gastroesophageal reflux disease)   . Hypertension   . Sleep apnea    history of, MD tookpt. off machine 7 yrs, ago    Past Surgical History:  Procedure Laterality Date  . ABDOMINAL HYSTERECTOMY    . adnoides    . BREAST SURGERY    . CHOLECYSTECTOMY    . COLONOSCOPY    . DILATION AND CURETTAGE OF UTERUS    . GASTRIC BYPASS  1998  . IUD REMOVAL    . TONSILLECTOMY    . TOTAL KNEE ARTHROPLASTY Left 09/22/2018  . TOTAL KNEE ARTHROPLASTY Left 09/22/2018   Procedure: LEFT TOTAL KNEE ARTHROPLASTY;  Surgeon: Cammy Copaean, Gregory Scott, MD;  [facility];  Service: Orthopedics;  Laterality: Left;    There were no vitals filed for this visit.  Subjective Assessment - 12/24/18 1957    Subjective  Pt reports she was tired after PT session yesterday - took a nap;  [facility]    Pertinent History  Lt rotator cuff tendonitis: carpal tunnel syndrome: HTN; Lt TKA 09-22-18     Limitations  Standing;Walking    How long can you sit comfortably?  pt states she usually does not sit >20"    How long can you stand comfortably?  3"-5" - no longer wearing knee sleeve    How long can you walk comfortably?  unsure - states she is doing 5-10" on treadmill    Patient Stated Goals  be able to negotiate steps with step over step sequence; increase Lt knee flexion to at least 120 degrees    Currently in Pain?  No/denies    Pain Onset  More than a month ago         [facility] temp. 87.4 degrees  Patient seen for aquatic therapy today.  Treatment took place in water 3.5-4 feet deep depending upon activity.  Pt entered  And exited the pool via step negotiation with use of hand rails with supervision for safety.  Runner's stretch for Lt hamstring and gastroc stretching;  Pt performed water walking for warm up - forwards, backwards, sideways 127m across pool x 1 rep each direction; Sidestepping with small squats 577m across pool  Marching forwards and backwards 47m x 2 reps with pt  flexing Lt knee to tolerance  Pt performed standing Lt hip abduction, extension and flexion on each leg 10 reps each direction: Standing Lt knee flexion 10 reps Marching in place 10 reps each without UE support Heel raises bil. LE's 10 reps Partial squats x 10 reps with pt holding at pool edge prn; Lt unilateral squats 5 reps with bil. UE support on pool edge  Pt performed bicycling LE's with use of pool noodle for flotation 17m x 2 reps across pool  Pt performed hip extension closed chain - pushing blue noodle down toward pool floor 10 reps  Crossovers front - RLE over LLE 80m x 1 rep    Pt requires buoyancy of water for support and for the ability to off load the body while in water;  Weight of LLE is reduced which allows More freedom of movement to facilitate increased AROM of Lt knee; pt requires viscosity of water for resistance for strengthening                                         PT Long Term Goals - 12/24/18 2004      PT LONG TERM GOAL #1   Title  Pt will increase Lt knee active flexion to 120 degrees for increased mobility and LLE positioning.    Baseline  Lt knee active flexion 106; comfortable flexion 100 degrees;  passive Lt knee flexion 108 degrees    Time  6    Period  Weeks    Status  New      PT LONG TERM GOAL #2   Title  Pt will be independent in HEP including aquatic exercises for continuation upon D/C from PT.    Time  6    Period  Weeks    Status  New      PT LONG TERM GOAL #3   Title  PT will negotiate 4 steps using 1 hand rail with step over step sequence for incr. efficiency with step negotiation.    Time  6    Period  Weeks    Status  New      PT LONG TERM GOAL #4   Title  Pt will ambulate 500' on flat even and uneven surfaces without SPC modified independently.    Time  6    Period  Weeks    Status  New      PT LONG TERM GOAL #5   Title  Pt will amb. >/= 850' in 6" walk test to demonstrate increased gait speed and endurance.    Baseline  780' in 6" walk test with [facility] on 11-24-18    Time  6    Period  Weeks    Status  New            Plan - 12/24/18 1959    Clinical Impression Statement  Pt demonstrating improved balance with water walking and also improved Lt knee flexion in seated position on bench in water; pt able to perform Lt unilateral squats with UE support on pool wall.  Pt tolerated aquatic exercise well with few rest breaks required.    Personal Factors and Comorbidities  Comorbidity 1;Fitness    Examination-Activity Limitations  Stand;Transfers;Stairs;Squat;Locomotion Level    Examination-Participation Restrictions  Meal Prep;Community Activity;Cleaning;Laundry;Shop    Stability/Clinical Decision Making  Stable/Uncomplicated    Rehab Potential  Good    PT Frequency  2x / week    PT Duration  6 weeks    PT Treatment/Interventions  ADLs/Self  Care Home Management;Aquatic Therapy;Balance training;Therapeutic exercise;Therapeutic activities;Gait training;Neuromuscular re-education;Patient/family education    PT Next Visit Plan  balance and strengthening HEP - exercises to increase Lt knee flexion and for Lt knee strengthening:  aquatic therapy initiated on 12-02-18 ( I plan to give aquatic exercises for HEP upon D/C)    PT Home Exercise Plan  theraband for Lt quads and hamstrings ( as tolerated)    Consulted and Agree with Plan of Care  Patient       Patient will benefit from skilled therapeutic intervention in order to improve the following deficits and impairments:  Difficulty walking, Pain, Decreased strength, Decreased balance, Decreased activity tolerance, Impaired flexibility, Decreased range of motion  Visit Diagnosis: Muscle weakness (generalized)  Other abnormalities of gait and mobility     Problem List Patient Active Problem List   Diagnosis Date Noted  . Arthritis of left knee 09/22/2018  . Alcohol use, unspecified with unspecified alcohol-induced disorder (HCC) 05/27/2018  . PTSD (post-traumatic stress disorder) 05/30/2016  . Alpha thalassemia trait 05/30/2016  . Hypertension 05/30/2016  . Rotator cuff tendonitis, left 05/30/2016  . Osteoarthritis of left knee 05/30/2016  . Carpal tunnel syndrome 05/30/2016  . Prediabetes 05/30/2016  . Onychomycosis 05/30/2016  . S/P tonsillectomy 05/30/2016  . Goiter, toxic diffuse 05/30/2016  . Chronic tonsillitis 05/30/2016    Chrystel Barefield, Donavan Burnet, PT, ATRIC 12/24/2018, 8:05 PM  [facility] [address] [place], [place], [address] Phone: [phone]   Fax:  [phone]  Name: Cheryl Prince MRN: [id number] Date of Birth: 11-Jun-1955

## 2018-12-29 ENCOUNTER — Ambulatory Visit: Payer: Medicare Other | Attending: Orthopedic Surgery | Admitting: Physical Therapy

## 2018-12-29 ENCOUNTER — Other Ambulatory Visit: Payer: Self-pay

## 2018-12-29 DIAGNOSIS — R2681 Unsteadiness on feet: Secondary | ICD-10-CM | POA: Diagnosis present

## 2018-12-29 DIAGNOSIS — R2689 Other abnormalities of gait and mobility: Secondary | ICD-10-CM | POA: Insufficient documentation

## 2018-12-29 DIAGNOSIS — M6281 Muscle weakness (generalized): Secondary | ICD-10-CM | POA: Diagnosis present

## 2018-12-30 ENCOUNTER — Encounter: Payer: Self-pay | Admitting: Physical Therapy

## 2018-12-30 ENCOUNTER — Ambulatory Visit: Payer: Medicare Other | Admitting: Physical Therapy

## 2018-12-30 DIAGNOSIS — R2689 Other abnormalities of gait and mobility: Secondary | ICD-10-CM

## 2018-12-30 DIAGNOSIS — R2681 Unsteadiness on feet: Secondary | ICD-10-CM

## 2018-12-30 DIAGNOSIS — M6281 Muscle weakness (generalized): Secondary | ICD-10-CM

## 2018-12-30 NOTE — Therapy (Signed)
[facility] [address] [place], [place], [address] Phone: [phone]   Fax:  [phone]  Physical Therapy Treatment  Patient Details  Name: Cheryl Prince MRN: [id number] Date of Birth: Apr 30, 1955 Referring Provider (PT): Dr. Cammy CopaGregory Scott Dean   Encounter Date: 12/29/2018  PT End of Session - 12/30/18 2105    Visit Number  7    Number of Visits  13    Date for PT Re-Evaluation  01/15/19    Authorization Type  UHC Medicare    Authorization Time Period  11-24-18 - 02-21-19    PT Start Time  1015    PT Stop Time  1101    PT Time Calculation (min)  46 min    Equipment Utilized During Treatment  --   ankle cuffs for resistance; pool noodle   Activity Tolerance  Patient tolerated treatment well    Behavior During Therapy  [facility] for tasks assessed/performed       Past Medical History:  Diagnosis Date  . Allergy   . Anemia   . Anxiety   . Arthritis   . Asthma    borderline bronchitis  . Bronchitis   . GERD (gastroesophageal reflux disease)   . Hypertension   . Sleep apnea    history of, MD tookpt. off machine 7 yrs, ago    Past Surgical History:  Procedure Laterality Date  . ABDOMINAL HYSTERECTOMY    . adnoides    . BREAST SURGERY    . CHOLECYSTECTOMY    . COLONOSCOPY    . DILATION AND CURETTAGE OF UTERUS    . GASTRIC BYPASS  1998  . IUD REMOVAL    . TONSILLECTOMY    . TOTAL KNEE ARTHROPLASTY Left 09/22/2018  . TOTAL KNEE ARTHROPLASTY Left 09/22/2018   Procedure: LEFT TOTAL KNEE ARTHROPLASTY;  Surgeon: Cammy Copaean, Gregory Scott, MD;  [facility];  Service: Orthopedics;  Laterality: Left;    There were no vitals filed for this visit.  Subjective Assessment - 12/30/18 2101    Subjective  Pt reports she continues to ride her bike at home and walks on her treadmill for approx. 20"- states she is doing well    Pertinent History  Lt rotator cuff tendonitis: carpal tunnel syndrome: HTN; Lt TKA  09-22-18    Limitations  Standing;Walking    How long can you sit comfortably?  pt states she usually does not sit >20"    How long can you stand comfortably?  3"-5" - no longer wearing knee sleeve    How long can you walk comfortably?  unsure - states she is doing 5-10" on treadmill    Patient Stated Goals  be able to negotiate steps with step over step sequence; increase Lt knee flexion to at least 120 degrees    Currently in Pain?  No/denies    Pain Onset  More than a month ago                       [facility] Treatment/Exercise - 12/30/18 0001      Ambulation/Gait   Ambulation/Gait  Yes    Ambulation/Gait Assistance  5: Supervision    Ambulation Distance (Feet)  230 Feet    Assistive device  None    Gait Pattern  Within Functional Limits    Ambulation Surface  Level;Indoor      Exercises   Exercises  Knee/Hip      Knee/Hip Exercises: Stretches  Active Hamstring Stretch  Left;1 rep;30 seconds   runner's stretch   Gastroc Stretch  Left;1 rep;20 seconds   Lt heel off step     Knee/Hip Exercises: Aerobic   Recumbent Bike  SciFit level 2.0 x 5" with intermittent UE's - LE's continuously       Knee/Hip Exercises: Machines for Strengthening   Cybex Leg Press  bil. LE's 55# 20 reps;  LLE only       Knee/Hip Exercises: Standing   Knee Flexion  Strengthening;Left;1 set;10 reps   3# weight used   Forward Step Up  Left;1 set;10 reps;Hand Hold: 2;Step Height: 6"    Step Down  Left;1 set;10 reps;Hand Hold: 2;Step Height: 6"    Functional Squat  1 set;5 reps    Other Standing Knee Exercises  marching in place 10 reps - for increased Lt knee ROM - no weight used      Knee/Hip Exercises: Seated   Long Arc Quad  Strengthening;Left;1 set;10 reps;Weights   3#   Marching  Left;1 set;10 reps;Weights   3# weight   Hamstring Curl  Strengthening;Left;1 set;10 reps;Other (comment)   with red theraband         Balance Exercises - 12/30/18 2104      Balance  Exercises: Standing   Rockerboard  Anterior/posterior;10 reps;Intermittent UE support    Other Standing Exercises  pt performed stepping Lt leg over/back of balance beam; then sideways stepping with Lt leg over balance beam 10 reps each             PT Long Term Goals - 12/30/18 2108      PT LONG TERM GOAL #1   Title  Pt will increase Lt knee active flexion to 120 degrees for increased mobility and LLE positioning.    Baseline  Lt knee active flexion 106; comfortable flexion 100 degrees;  passive Lt knee flexion 108 degrees    Time  6    Period  Weeks    Status  New      PT LONG TERM GOAL #2   Title  Pt will be independent in HEP including aquatic exercises for continuation upon D/C from PT.    Time  6    Period  Weeks    Status  New      PT LONG TERM GOAL #3   Title  PT will negotiate 4 steps using 1 hand rail with step over step sequence for incr. efficiency with step negotiation.    Time  6    Period  Weeks    Status  New      PT LONG TERM GOAL #4   Title  Pt will ambulate 500' on flat even and uneven surfaces without SPC modified independently.    Time  6    Period  Weeks    Status  New      PT LONG TERM GOAL #5   Title  Pt will amb. >/= 850' in 6" walk test to demonstrate increased gait speed and endurance.    Baseline  780' in 6" walk test with [facility] on 11-24-18    Time  6    Period  Weeks    Status  New            Plan - 12/30/18 2105    Clinical Impression Statement  Pt is progressing well towards goals - increased weight on leg press exercise from 50# to 55#.  Lt knee active flexion = 110 degrees.  Pt  is amb. with less antalgic gait pattern without use of SPC.    Personal Factors and Comorbidities  Comorbidity 1;Fitness    Examination-Activity Limitations  Stand;Transfers;Stairs;Squat;Locomotion Level    Examination-Participation Restrictions  Meal Prep;Community Activity;Cleaning;Laundry;Shop    Stability/Clinical Decision Making  Stable/Uncomplicated     Rehab Potential  Good    PT Frequency  2x / week    PT Duration  6 weeks    PT Treatment/Interventions  ADLs/Self Care Home Management;Aquatic Therapy;Balance training;Therapeutic exercise;Therapeutic activities;Gait training;Neuromuscular re-education;Patient/family education    PT Next Visit Plan  balance and strengthening HEP - exercises to increase Lt knee flexion and for Lt knee strengthening:  aquatic therapy initiated on 12-02-18 ( I plan to give aquatic exercises for HEP upon D/C)    PT Home Exercise Plan  theraband for Lt quads and hamstrings ( as tolerated)    Consulted and Agree with Plan of Care  Patient       Patient will benefit from skilled therapeutic intervention in order to improve the following deficits and impairments:  Difficulty walking, Pain, Decreased strength, Decreased balance, Decreased activity tolerance, Impaired flexibility, Decreased range of motion  Visit Diagnosis: Muscle weakness (generalized)  Other abnormalities of gait and mobility  Unsteadiness on feet     Problem List Patient Active Problem List   Diagnosis Date Noted  . Arthritis of left knee 09/22/2018  . Alcohol use, unspecified with unspecified alcohol-induced disorder (HCC) 05/27/2018  . PTSD (post-traumatic stress disorder) 05/30/2016  . Alpha thalassemia trait 05/30/2016  . Hypertension 05/30/2016  . Rotator cuff tendonitis, left 05/30/2016  . Osteoarthritis of left knee 05/30/2016  . Carpal tunnel syndrome 05/30/2016  . Prediabetes 05/30/2016  . Onychomycosis 05/30/2016  . S/P tonsillectomy 05/30/2016  . Goiter, toxic diffuse 05/30/2016  . Chronic tonsillitis 05/30/2016    Kary Kos, PT 12/30/2018, 9:09 PM  [facility] [address] [place], [place], [address] Phone: [phone]   Fax:  [phone]  Name: Cheryl Prince MRN: [id number] Date of Birth: 10/24/1955

## 2018-12-31 ENCOUNTER — Encounter: Payer: Self-pay | Admitting: Physical Therapy

## 2018-12-31 NOTE — Therapy (Signed)
[facility] [address] [place], [place], [address] Phone: [phone]   Fax:  [phone]  Physical Therapy Treatment  Patient Details  Name: Cheryl Prince MRN: [id number] Date of Birth: 29-Mar-1955 Referring Provider (PT): Dr. Meredith Pel   Encounter Date: 12/30/2018  PT End of Session - 12/31/18 2035    Visit Number  8    Number of Visits  13    Date for PT Re-Evaluation  01/15/19    Authorization Type  [employer] Medicare    Authorization Time Period  11-24-18 - 02-21-19    PT Start Time  1500    PT Stop Time  1545    PT Time Calculation (min)  45 min    Equipment Utilized During Treatment  --   ankle cuffs for resistance; pool noodle   Activity Tolerance  Patient tolerated treatment well    Behavior During Therapy  [facility] for tasks assessed/performed       Past Medical History:  Diagnosis Date  . Allergy   . Anemia   . Anxiety   . Arthritis   . Asthma    borderline bronchitis  . Bronchitis   . GERD (gastroesophageal reflux disease)   . Hypertension   . Sleep apnea    history of, MD tookpt. off machine 7 yrs, ago    Past Surgical History:  Procedure Laterality Date  . ABDOMINAL HYSTERECTOMY    . adnoides    . BREAST SURGERY    . CHOLECYSTECTOMY    . COLONOSCOPY    . DILATION AND CURETTAGE OF UTERUS    . GASTRIC BYPASS  1998  . IUD REMOVAL    . TONSILLECTOMY    . TOTAL KNEE ARTHROPLASTY Left 09/22/2018  . TOTAL KNEE ARTHROPLASTY Left 09/22/2018   Procedure: LEFT TOTAL KNEE ARTHROPLASTY;  Surgeon: Meredith Pel, MD;  Location: [place];  Service: Orthopedics;  Laterality: Left;    There were no vitals filed for this visit.  Subjective Assessment - 12/31/18 2034    Subjective  Pt reports no problems or changes - presents for aquatic therapy at [facility]    Patient Stated Goals  be able to negotiate steps with step over step sequence; increase Lt knee flexion to at least 120 degrees    Currently in Pain?  No/denies              Aquatic therapy at [facility] - pool temp. 87.6 degrees  Patient seen for aquatic therapy today.  Treatment took place in water 3.5-4 feet deep depending upon activity.  Pt entered  And exited the pool via ramp negotiation with use of hand rails modified independently.  Runner's stretch for Lt hamstring and gastroc stretching;  Pt performed water walking for warm up - forwards, backwards, sideways 34m across pool x 1 rep each direction; Sidestepping with small squats 82m across pool  Marching forwards and backwards 56m x 2 reps with pt flexing Lt knee to tolerance  Pt performed standing Lt hip abduction, extension and flexion on each leg 10 reps each direction: Standing Lt knee flexion 10 reps Marching in place 10 reps each without UE support Heel raises bil. LE's 10 reps Partial squats x 10 reps with pt holding at pool edge prn; Lt unilateral squats 5 reps with bil. UE support on pool edge  Pt performed bicycling LE's with use of pool noodle for flotation 12m x 2 reps across pool   Crossovers front - RLE over LLE 37m  x 1 rep    Pt requires buoyancy of water for support and for the ability to off load the body while in water;  Weight of LLE is reduced which allows More freedom of movement to facilitate increased AROM of Lt knee; pt requires viscosity of water for resistance for strengthening                         PT Long Term Goals - 12/31/18 2039      PT LONG TERM GOAL #1   Title  Pt will increase Lt knee active flexion to 120 degrees for increased mobility and LLE positioning.    Baseline  Lt knee active flexion 106; comfortable flexion 100 degrees;  passive Lt knee flexion 108 degrees    Time  6    Period  Weeks    Status  New      PT LONG TERM GOAL #2   Title  Pt will be independent in HEP including aquatic exercises for continuation upon D/C from PT.    Time  6    Period  Weeks    Status  New      PT  LONG TERM GOAL #3   Title  PT will negotiate 4 steps using 1 hand rail with step over step sequence for incr. efficiency with step negotiation.    Time  6    Period  Weeks    Status  New      PT LONG TERM GOAL #4   Title  Pt will ambulate 500' on flat even and uneven surfaces without SPC modified independently.    Time  6    Period  Weeks    Status  New      PT LONG TERM GOAL #5   Title  Pt will amb. >/= 850' in 6" walk test to demonstrate increased gait speed and endurance.    Baseline  780' in 6" walk test with [facility] on 11-24-18    Time  6    Period  Weeks    Status  New            Plan - 12/31/18 2037    Clinical Impression Statement  Pt continues to progress well towards goals; demonstrates improvements in Lt knee AROM, strength and gait; pt able to tolerate 45" aquatic therapy session with few rest breaks    PT Frequency  2x / week    PT Duration  6 weeks    PT Treatment/Interventions  ADLs/Self Care Home Management;Aquatic Therapy;Balance training;Therapeutic exercise;Therapeutic activities;Gait training;Neuromuscular re-education;Patient/family education    PT Next Visit Plan  balance and strengthening HEP - exercises to increase Lt knee flexion and for Lt knee strengthening:  aquatic therapy initiated on 12-02-18 ( I plan to give aquatic exercises for HEP upon D/C)       Patient will benefit from skilled therapeutic intervention in order to improve the following deficits and impairments:     Visit Diagnosis: Muscle weakness (generalized)  Other abnormalities of gait and mobility  Unsteadiness on feet     Problem List Patient Active Problem List   Diagnosis Date Noted  . Arthritis of left knee 09/22/2018  . Alcohol use, unspecified with unspecified alcohol-induced disorder (HCC) 05/27/2018  . PTSD (post-traumatic stress disorder) 05/30/2016  . Alpha thalassemia trait 05/30/2016  . Hypertension 05/30/2016  . Rotator cuff tendonitis, left 05/30/2016  .  Osteoarthritis of left knee 05/30/2016  . Carpal tunnel syndrome 05/30/2016  .  Prediabetes 05/30/2016  . Onychomycosis 05/30/2016  . S/P tonsillectomy 05/30/2016  . Goiter, toxic diffuse 05/30/2016  . Chronic tonsillitis 05/30/2016    Xxavier Noon, Donavan BurnetLinda Suzanne, PT, ATRIC 12/31/2018, 8:40 PM  [facility] [address] [place], [place], [address] Phone: [phone]   Fax:  [phone]  Name: Terrilee Croakhyllis Denise Manalo MRN: [id number] Date of Birth: Aug 19, 1955

## 2019-01-04 ENCOUNTER — Ambulatory Visit: Payer: Medicare Other | Admitting: Physical Therapy

## 2019-01-04 ENCOUNTER — Other Ambulatory Visit: Payer: Self-pay

## 2019-01-04 DIAGNOSIS — M6281 Muscle weakness (generalized): Secondary | ICD-10-CM

## 2019-01-04 DIAGNOSIS — R2681 Unsteadiness on feet: Secondary | ICD-10-CM

## 2019-01-04 DIAGNOSIS — R2689 Other abnormalities of gait and mobility: Secondary | ICD-10-CM

## 2019-01-05 ENCOUNTER — Ambulatory Visit: Payer: Medicare Other | Admitting: Physical Therapy

## 2019-01-05 ENCOUNTER — Encounter: Payer: Self-pay | Admitting: Physical Therapy

## 2019-01-05 DIAGNOSIS — R2689 Other abnormalities of gait and mobility: Secondary | ICD-10-CM

## 2019-01-05 DIAGNOSIS — M6281 Muscle weakness (generalized): Secondary | ICD-10-CM

## 2019-01-05 DIAGNOSIS — R2681 Unsteadiness on feet: Secondary | ICD-10-CM

## 2019-01-05 NOTE — Therapy (Signed)
[facility] [address] [place], [place], [address] Phone: [phone]   Fax:  [phone]  Physical Therapy Treatment  Patient Details  Name: Cheryl Prince MRN: [id number] Date of Birth: August 05, 1955 Referring Provider (PT): Dr. Meredith Pel   Encounter Date: 01/04/2019  PT End of Session - 01/05/19 2231    Visit Number  9    Number of Visits  13    Date for PT Re-Evaluation  01/15/19    Authorization Type  [employer]HC Medicare    Authorization Time Period  11-24-18 - 02-21-19    PT Start Time  1500    PT Stop Time  1545    PT Time Calculation (min)  45 min    Equipment Utilized During Treatment  --   ankle cuffs for resistance; pool noodle   Activity Tolerance  Patient tolerated treatment well    Behavior During Therapy  [facility] for tasks assessed/performed       Past Medical History:  Diagnosis Date  . Allergy   . Anemia   . Anxiety   . Arthritis   . Asthma    borderline bronchitis  . Bronchitis   . GERD (gastroesophageal reflux disease)   . Hypertension   . Sleep apnea    history of, MD tookpt. off machine 7 yrs, ago    Past Surgical History:  Procedure Laterality Date  . ABDOMINAL HYSTERECTOMY    . adnoides    . BREAST SURGERY    . CHOLECYSTECTOMY    . COLONOSCOPY    . DILATION AND CURETTAGE OF UTERUS    . GASTRIC BYPASS  1998  . IUD REMOVAL    . TONSILLECTOMY    . TOTAL KNEE ARTHROPLASTY Left 09/22/2018  . TOTAL KNEE ARTHROPLASTY Left 09/22/2018   Procedure: LEFT TOTAL KNEE ARTHROPLASTY;  Surgeon: Meredith Pel, MD;  Location: [place];  Service: Orthopedics;  Laterality: Left;    There were no vitals filed for this visit.  Subjective Assessment - 01/05/19 2230    Subjective  Pt states she is doing well - mild left knee discomfort reported today    Patient Stated Goals  be able to negotiate steps with step over step sequence; increase Lt knee flexion to at least 120 degrees    Currently in Pain?  No/denies                                    PT Long Term Goals - 01/05/19 2232      PT LONG TERM GOAL #1   Title  Pt will increase Lt knee active flexion to 120 degrees for increased mobility and LLE positioning.    Baseline  Lt knee active flexion 106; comfortable flexion 100 degrees;  passive Lt knee flexion 108 degrees    Time  6    Period  Weeks    Status  New      PT LONG TERM GOAL #2   Title  Pt will be independent in HEP including aquatic exercises for continuation upon D/C from PT.    Time  6    Period  Weeks    Status  New      PT LONG TERM GOAL #3   Title  PT will negotiate 4 steps using 1 hand rail with step over step sequence for incr. efficiency with step negotiation.    Time  6  Period  Weeks    Status  New      PT LONG TERM GOAL #4   Title  Pt will ambulate 500' on flat even and uneven surfaces without SPC modified independently.    Time  6    Period  Weeks    Status  New      PT LONG TERM GOAL #5   Title  Pt will amb. >/= 850' in 6" walk test to demonstrate increased gait speed and endurance.    Baseline  780' in 6" walk test with [facility] on 11-24-18    Time  6    Period  Weeks    Status  New            Plan - 01/05/19 2231    PT Frequency  2x / week    PT Duration  6 weeks    PT Treatment/Interventions  ADLs/Self Care Home Management;Aquatic Therapy;Balance training;Therapeutic exercise;Therapeutic activities;Gait training;Neuromuscular re-education;Patient/family education    PT Next Visit Plan  balance and strengthening HEP - exercises to increase Lt knee flexion and for Lt knee strengthening:  aquatic therapy initiated on 12-02-18 ( I plan to give aquatic exercises for HEP upon D/C)       Patient will benefit from skilled therapeutic intervention in order to improve the following deficits and impairments:     Visit Diagnosis: Muscle weakness (generalized)  Other abnormalities of gait and  mobility  Unsteadiness on feet     Problem List Patient Active Problem List   Diagnosis Date Noted  . Arthritis of left knee 09/22/2018  . Alcohol use, unspecified with unspecified alcohol-induced disorder (HCC) 05/27/2018  . PTSD (post-traumatic stress disorder) 05/30/2016  . Alpha thalassemia trait 05/30/2016  . Hypertension 05/30/2016  . Rotator cuff tendonitis, left 05/30/2016  . Osteoarthritis of left knee 05/30/2016  . Carpal tunnel syndrome 05/30/2016  . Prediabetes 05/30/2016  . Onychomycosis 05/30/2016  . S/P tonsillectomy 05/30/2016  . Goiter, toxic diffuse 05/30/2016  . Chronic tonsillitis 05/30/2016    Jakyle Petrucelli, Donavan Burnet, PT, ATRIC 01/05/2019, 10:33 PM  [facility] [address] [place], [place], [address] Phone: [phone]   Fax:  [phone]  Name: Alexica Schlossberg MRN: [id number] Date of Birth: Jul 09, 1955

## 2019-01-06 ENCOUNTER — Encounter: Payer: Self-pay | Admitting: Physical Therapy

## 2019-01-06 NOTE — Therapy (Signed)
[facility] [address] [place], [place], [address] Phone: [phone]   Fax:  [phone]  Physical Therapy Treatment & 10th visit progress note    Reporting period:  11-24-18 - 01-05-19; please see below for progress towards goals - D/C is planned next week as pt has made excellent progress   Patient Details  Name: Cheryl Prince MRN: [id number] Date of Birth: 1955/07/19 Referring Provider (PT): Dr. Meredith Pel   Encounter Date: 01/05/2019  PT End of Session - 01/06/19 1355    Visit Number  10    Number of Visits  13    Date for PT Re-Evaluation  01/15/19    Authorization Type  UHC Medicare    Authorization Time Period  11-24-18 - 02-21-19    PT Start Time  1016    PT Stop Time  1100    PT Time Calculation (min)  44 min    Equipment Utilized During Treatment  --   ankle cuffs for resistance; pool noodle   Activity Tolerance  Patient tolerated treatment well    Behavior During Therapy  [facility] for tasks assessed/performed       Past Medical History:  Diagnosis Date  . Allergy   . Anemia   . Anxiety   . Arthritis   . Asthma    borderline bronchitis  . Bronchitis   . GERD (gastroesophageal reflux disease)   . Hypertension   . Sleep apnea    history of, MD tookpt. off machine 7 yrs, ago    Past Surgical History:  Procedure Laterality Date  . ABDOMINAL HYSTERECTOMY    . adnoides    . BREAST SURGERY    . CHOLECYSTECTOMY    . COLONOSCOPY    . DILATION AND CURETTAGE OF UTERUS    . GASTRIC BYPASS  1998  . IUD REMOVAL    . TONSILLECTOMY    . TOTAL KNEE ARTHROPLASTY Left 09/22/2018  . TOTAL KNEE ARTHROPLASTY Left 09/22/2018   Procedure: LEFT TOTAL KNEE ARTHROPLASTY;  Surgeon: Meredith Pel, MD;  Location: [place];  Service: Orthopedics;  Laterality: Left;    There were no vitals filed for this visit.  Subjective Assessment - 01/06/19 1348    Subjective  pt states her left knee was  sore after aquatic therapy on Monday - states she took some Tylenol and that relieved it    Patient Stated Goals  be able to negotiate steps with step over step sequence; increase Lt knee flexion to at least 120 degrees    Currently in Pain?  No/denies                       [facility] Adult PT Treatment/Exercise - 01/06/19 0001      Ambulation/Gait   Ambulation/Gait  Yes    Ambulation/Gait Assistance  5: Supervision    Ambulation/Gait Assistance Details  3# weight on LLE    Ambulation Distance (Feet)  230 Feet    Assistive device  None    Gait Pattern  Within Functional Limits    Ambulation Surface  Level;Indoor      Exercises   Exercises  Knee/Hip      Knee/Hip Exercises: Stretches   Active Hamstring Stretch  Left;1 rep;30 seconds   runner's stretch     Knee/Hip Exercises: Aerobic   Recumbent Bike  SciFit level 2.5 x 10" with intermittent UE's - LE's continuously       Knee/Hip Exercises: Machines for  Strengthening   Cybex Leg Press  bil. LE's 55# 15 reps;  LLE only 30# 20 reps      Knee/Hip Exercises: Standing   Knee Flexion  Strengthening;Left;1 set;10 reps   3# weight in standing 10 reps; red band in seated 10 reps   Hip Abduction  Stengthening;Left;1 set;10 reps;Knee straight   3# weight used    Hip Extension  Stengthening;Left;1 set;10 reps;Knee straight   3# weight   Forward Step Up  Left;1 set;10 reps;Hand Hold: 2;Step Height: 6"    Step Down  Left;1 set;Hand Hold: 2;Step Height: 6";5 reps    Other Standing Knee Exercises  hip and knee flexion LLE 3# weight 10 reps           Balance Exercises - 01/06/19 1354      Balance Exercises: Standing   Sidestepping  2 reps;Other (comment)   added crossovers front 10' , stepping behind 10' x 1    Other Standing Exercises  pt performed stepping Lt leg over/back of balance beam; then sideways stepping with Lt leg over balance beam 10 reps each             PT Long Term Goals - 01/06/19 1356      PT  LONG TERM GOAL #1   Title  Pt will increase Lt knee active flexion to 120 degrees for increased mobility and LLE positioning.    Baseline  Lt knee active flexion 106; comfortable flexion 100 degrees;  passive Lt knee flexion 108 degrees;  114 degrees on 01-05-19    Time  6    Period  Weeks    Status  On-going      PT LONG TERM GOAL #2   Title  Pt will be independent in HEP including aquatic exercises for continuation upon D/C from PT.    Baseline  met 01-05-19    Time  6    Period  Weeks    Status  Achieved      PT LONG TERM GOAL #3   Title  PT will negotiate 4 steps using 1 hand rail with step over step sequence for incr. efficiency with step negotiation.    Time  6    Period  Weeks    Status  On-going      PT LONG TERM GOAL #4   Title  Pt will ambulate 500' on flat even and uneven surfaces without SPC modified independently.    Time  6    Period  Weeks    Status  On-going      PT LONG TERM GOAL #5   Title  Pt will amb. >/= 850' in 6" walk test to demonstrate increased gait speed and endurance.    Baseline  780' in 6" walk test with [facility] on 11-24-18    Time  6    Period  Weeks    Status  On-going            Plan - 01/06/19 1358    Clinical Impression Statement  Pt has partially met LTG #1 with active Lt knee flexion increasing from 106 to 114 degrees; LTG #2 met with LTG's #3-5 ongoing; pt has 2 more PT appts with D/C planned next week as pt has made excellent progress.    PT Frequency  2x / week    PT Duration  6 weeks    PT Treatment/Interventions  ADLs/Self Care Home Management;Aquatic Therapy;Balance training;Therapeutic exercise;Therapeutic activities;Gait training;Neuromuscular re-education;Patient/family education    PT Next Visit  Plan  balance and strengthening HEP - exercises to increase Lt knee flexion and for Lt knee strengthening:  aquatic therapy initiated on 12-02-18 ( I plan to give aquatic exercises for HEP upon D/C)    Consulted and Agree with Plan of Care   Patient       Patient will benefit from skilled therapeutic intervention in order to improve the following deficits and impairments:     Visit Diagnosis: Muscle weakness (generalized)  Other abnormalities of gait and mobility  Unsteadiness on feet     Problem List Patient Active Problem List   Diagnosis Date Noted  . Arthritis of left knee 09/22/2018  . Alcohol use, unspecified with unspecified alcohol-induced disorder ([place]) 05/27/2018  . PTSD (post-traumatic stress disorder) 05/30/2016  . Alpha thalassemia trait 05/30/2016  . Hypertension 05/30/2016  . Rotator cuff tendonitis, left 05/30/2016  . Osteoarthritis of left knee 05/30/2016  . Carpal tunnel syndrome 05/30/2016  . Prediabetes 05/30/2016  . Onychomycosis 05/30/2016  . S/P tonsillectomy 05/30/2016  . Goiter, toxic diffuse 05/30/2016  . Chronic tonsillitis 05/30/2016    Alda Lea, PT 01/06/2019, 2:07 PM  [facility] [address] [place], [place], [address] Phone: [phone]   Fax:  [phone]  Name: Aracely Rickett MRN: [id number] Date of Birth: 01/28/1956

## 2019-01-12 ENCOUNTER — Other Ambulatory Visit: Payer: Self-pay

## 2019-01-12 ENCOUNTER — Encounter: Payer: Self-pay | Admitting: Physical Therapy

## 2019-01-12 ENCOUNTER — Ambulatory Visit: Payer: Medicare Other | Admitting: Physical Therapy

## 2019-01-12 DIAGNOSIS — R2689 Other abnormalities of gait and mobility: Secondary | ICD-10-CM

## 2019-01-12 DIAGNOSIS — M6281 Muscle weakness (generalized): Secondary | ICD-10-CM

## 2019-01-13 ENCOUNTER — Encounter: Payer: Self-pay | Admitting: Physical Therapy

## 2019-01-13 ENCOUNTER — Ambulatory Visit: Payer: Medicare Other | Admitting: Physical Therapy

## 2019-01-13 DIAGNOSIS — M6281 Muscle weakness (generalized): Secondary | ICD-10-CM

## 2019-01-13 DIAGNOSIS — R2681 Unsteadiness on feet: Secondary | ICD-10-CM

## 2019-01-13 DIAGNOSIS — R2689 Other abnormalities of gait and mobility: Secondary | ICD-10-CM

## 2019-01-13 NOTE — Therapy (Signed)
[facility] [address] [place], [place], [address] Phone: [phone]   Fax:  [phone]  Physical Therapy Treatment  Patient Details  Name: Cheryl Prince MRN: [id number] Date of Birth: 05/24/1955 Referring Provider (PT): Dr. Meredith Pel   Encounter Date: 01/12/2019  PT End of Session - 01/13/19 1106    Visit Number  11    Number of Visits  13    Date for PT Re-Evaluation  01/15/19    Authorization Type  UHC Medicare    Authorization Time Period  11-24-18 - 02-21-19    PT Start Time  1015    PT Stop Time  1100    PT Time Calculation (min)  45 min    Equipment Utilized During Treatment  --   ankle cuffs for resistance; pool noodle   Activity Tolerance  Patient tolerated treatment well    Behavior During Therapy  [facility] for tasks assessed/performed       Past Medical History:  Diagnosis Date  . Allergy   . Anemia   . Anxiety   . Arthritis   . Asthma    borderline bronchitis  . Bronchitis   . GERD (gastroesophageal reflux disease)   . Hypertension   . Sleep apnea    history of, MD tookpt. off machine 7 yrs, ago    Past Surgical History:  Procedure Laterality Date  . ABDOMINAL HYSTERECTOMY    . adnoides    . BREAST SURGERY    . CHOLECYSTECTOMY    . COLONOSCOPY    . DILATION AND CURETTAGE OF UTERUS    . GASTRIC BYPASS  1998  . IUD REMOVAL    . TONSILLECTOMY    . TOTAL KNEE ARTHROPLASTY Left 09/22/2018  . TOTAL KNEE ARTHROPLASTY Left 09/22/2018   Procedure: LEFT TOTAL KNEE ARTHROPLASTY;  Surgeon: Meredith Pel, MD;  Location: [facility];  Service: Orthopedics;  Laterality: Left;    There were no vitals filed for this visit.  Subjective Assessment - 01/12/19 1011    Subjective  Pt presents for last land visit - states she walked on her treadmill this morning to get her knee loosened up    Patient Stated Goals  be able to negotiate steps with step over step sequence; increase Lt knee  flexion to at least 120 degrees    Currently in Pain?  Other (Comment)   pt states she took some pain medicine this am before PT appt                      [facility] Adult PT Treatment/Exercise - 01/13/19 0001      Transfers   Transfers  Sit to Stand    Number of Reps  10 reps    Comments  no UE support      Ambulation/Gait   Ambulation/Gait  Yes    Ambulation/Gait Assistance  6: Modified independent (Device/Increase time)    Ambulation Distance (Feet)  1012.8 Feet   in 6" walk test   Assistive device  None    Gait Pattern  Within Functional Limits    Ambulation Surface  Level;Indoor    Stairs  Yes    Stairs Assistance  6: Modified independent (Device/Increase time)    Stair Management Technique  One rail Left;Alternating pattern;Forwards    Number of Stairs  4    Height of Stairs  6      Standardized Balance Assessment   Standardized Balance Assessment  Timed  Up and Go Test      Timed Up and Go Test   TUG  Normal TUG    Normal TUG (seconds)  11.5      Exercises   Exercises  Knee/Hip      Knee/Hip Exercises: Stretches   Active Hamstring Stretch  Left;1 rep;30 seconds   runner's stretch   Gastroc Stretch  Left;1 rep;30 seconds   heel off edge of step     Knee/Hip Exercises: Aerobic   Recumbent Bike  SciFit level 2.5 x 8" with intermittent UE's - LE's continuously       Knee/Hip Exercises: Machines for Strengthening   Cybex Leg Press  bil. LE's 60# 15 reps;  LLE only 35# 20 reps      Knee/Hip Exercises: Standing   Knee Flexion  Strengthening;Left;1 set;10 reps   3# weight in standing 10 reps; red band in seated 10 reps   Forward Step Up  Left;1 set;10 reps;Hand Hold: 2;Step Height: 6"    Step Down  Left;1 set;Hand Hold: 2;Step Height: 6";5 reps    Other Standing Knee Exercises  hip and knee flexion LLE 3# weight 10 reps     Other Standing Knee Exercises  Lt knee flexion 3# in standing 10 reps                  PT Long Term Goals - 01/13/19  1055      PT LONG TERM GOAL #1   Title  Pt will increase Lt knee active flexion to 120 degrees for increased mobility and LLE positioning.    Baseline  Lt knee active flexion 106; comfortable flexion 100 degrees;  passive Lt knee flexion 108 degrees;  114 degrees on 01-05-19; 118 degrees Lt knee active flexion 01-12-19    Time  6    Period  Weeks    Status  Partially Met      PT LONG TERM GOAL #2   Title  Pt will be independent in HEP including aquatic exercises for continuation upon D/C from PT.    Baseline  met 01-05-19    Time  6    Period  Weeks    Status  Achieved      PT LONG TERM GOAL #3   Title  PT will negotiate 4 steps using 1 hand rail with step over step sequence for incr. efficiency with step negotiation.    Time  6    Period  Weeks    Status  Achieved      PT LONG TERM GOAL #4   Title  Pt will ambulate 500' on flat even and uneven surfaces without SPC modified independently.    Baseline  pt states she is still using cane but feels that she could walk to her mailbox without cane    Time  6    Period  Weeks    Status  Partially Met      PT LONG TERM GOAL #5   Title  Pt will amb. >/= 850' in 6" walk test to demonstrate increased gait speed and endurance.    Baseline  780' in 6" walk test with [facility] on 11-24-18; 1012.8' with no device - 01-12-19    Time  6    Period  Weeks    Status  Achieved            Plan - 01/13/19 1107    Clinical Impression Statement  Pt has met LTG's #1,2, & 5:  LTG's #1 and  4 partially met has pt's Lt knee active flexion = 118 degrees (not 120 degrees per stated LTG) and goal #4 partially met as pt is still using SPC minimally for assistance with outdoor ambulation for safety and security.  Pt has made excellent progress in increasing ROM and strength in LLE following TKR.    PT Frequency  2x / week    PT Duration  6 weeks    PT Treatment/Interventions  ADLs/Self Care Home Management;Aquatic Therapy;Balance training;Therapeutic  exercise;Therapeutic activities;Gait training;Neuromuscular re-education;Patient/family education    PT Next Visit Plan  balance and strengthening HEP - exercises to increase Lt knee flexion and for Lt knee strengthening:  aquatic therapy initiated on 12-02-18 ( I plan to give aquatic exercises for HEP upon D/C)    Consulted and Agree with Plan of Care  Patient       Patient will benefit from skilled therapeutic intervention in order to improve the following deficits and impairments:     Visit Diagnosis: Muscle weakness (generalized)  Other abnormalities of gait and mobility     Problem List Patient Active Problem List   Diagnosis Date Noted  . Arthritis of left knee 09/22/2018  . Alcohol use, unspecified with unspecified alcohol-induced disorder ([place]) 05/27/2018  . PTSD (post-traumatic stress disorder) 05/30/2016  . Alpha thalassemia trait 05/30/2016  . Hypertension 05/30/2016  . Rotator cuff tendonitis, left 05/30/2016  . Osteoarthritis of left knee 05/30/2016  . Carpal tunnel syndrome 05/30/2016  . Prediabetes 05/30/2016  . Onychomycosis 05/30/2016  . S/P tonsillectomy 05/30/2016  . Goiter, toxic diffuse 05/30/2016  . Chronic tonsillitis 05/30/2016    Alda Lea, PT 01/13/2019, 11:15 AM  [facility] [address] [place], [place], [address] Phone: [phone]   Fax:  [phone]  Name: Cheryl Prince MRN: [id number] Date of Birth: [birth year]

## 2019-01-14 ENCOUNTER — Encounter: Payer: Self-pay | Admitting: Physical Therapy

## 2019-01-14 NOTE — Therapy (Addendum)
[facility] [address] [place], [place], [address] Phone: [phone]   Fax:  [phone]  Physical Therapy Treatment & Discharge Summary  Patient Details  Name: Cheryl Prince MRN: [id number] Date of Birth: [birth year] Referring Provider (PT): Dr. Meredith Pel   Encounter Date: 01/13/2019  PT End of Session - 01/14/19 1145    Visit Number  12    Number of Visits  13    Date for PT Re-Evaluation  01/15/19    Authorization Type  UHC Medicare    Authorization Time Period  11-24-18 - 02-21-19    PT Start Time  1500    PT Stop Time  1545    PT Time Calculation (min)  45 min    Equipment Utilized During Treatment  --   ankle cuffs for resistance; pool noodle   Activity Tolerance  Patient tolerated treatment well    Behavior During Therapy  [facility] for tasks assessed/performed       Past Medical History:  Diagnosis Date  . Allergy   . Anemia   . Anxiety   . Arthritis   . Asthma    borderline bronchitis  . Bronchitis   . GERD (gastroesophageal reflux disease)   . Hypertension   . Sleep apnea    history of, MD tookpt. off machine 7 yrs, ago    Past Surgical History:  Procedure Laterality Date  . ABDOMINAL HYSTERECTOMY    . adnoides    . BREAST SURGERY    . CHOLECYSTECTOMY    . COLONOSCOPY    . DILATION AND CURETTAGE OF UTERUS    . GASTRIC BYPASS  1998  . IUD REMOVAL    . TONSILLECTOMY    . TOTAL KNEE ARTHROPLASTY Left 09/22/2018  . TOTAL KNEE ARTHROPLASTY Left 09/22/2018   Procedure: LEFT TOTAL KNEE ARTHROPLASTY;  Surgeon: Meredith Pel, MD;  Location: [facility];  Service: Orthopedics;  Laterality: Left;    There were no vitals filed for this visit.  Subjective Assessment - 01/14/19 1143    Subjective  Pt presents for final PT session - at [facility] for aquatic session; pt amb. without use of device today; pt arrives via [place] (early) and begins pool exercise program independently    Patient  Stated Goals  be able to negotiate steps with step over step sequence; increase Lt knee flexion to at least 120 degrees    Currently in Pain?  No/denies        Aquatic therapy at [facility] - pool temp. 87.6 degrees  Patient seen for aquatic therapy today.  Treatment took place in water 3.5-4 feet deep depending upon activity.  Pt entered  And exited the pool via ramp negotiation with use of hand rails modified independently.  Runner's stretch for Lt hamstring and gastroc stretching 30 sec hold x 1 rep at beginning and end of session Pt performed water walking for warm up - forwards, backwards, sideways 3macross pool x 1 rep each direction; Sidestepping with small squats 261mcross pool  Marching forwards and backwards 2576m2 reps with pt flexing Lt knee to tolerance  Seated position - on bench in pool - Lt LAQ 10 reps ;  Sit to stand without UE support 10 reps from bench  Pt performed standing Lt hip abduction, extension and flexion on each leg 10 reps each direction with use of ankle cuffs Marching in place 10 reps each without UE support  Partial squats x 10 reps with  pt holding at pool edge prn; Lt unilateral squats 10 reps with bil. UE support on pool edge  Pt performed bicycling LE's with use of pool noodle for flotation 87mx 2 reps across pool   Crossovers front - RLE over LLE 210m 1 rep    Pt requires buoyancy of water for support and for the ability to off load the body while in water;  Weight of LLE is reduced which allows More freedom of movement to facilitate increased AROM of Lt knee; pt requires viscosity of water for resistance for strengthening                                     PT Long Term Goals - 01/14/19 1157      PT LONG TERM GOAL #1   Title  Pt will increase Lt knee active flexion to 120 degrees for increased mobility and LLE positioning.    Baseline  Lt knee active flexion 106; comfortable flexion 100 degrees;  passive Lt  knee flexion 108 degrees;  114 degrees on 01-05-19; 118 degrees Lt knee active flexion 01-12-19    Time  6    Period  Weeks    Status  Partially Met      PT LONG TERM GOAL #2   Title  Pt will be independent in HEP including aquatic exercises for continuation upon D/C from PT.    Baseline  met 01-05-19    Time  6    Period  Weeks    Status  Achieved      PT LONG TERM GOAL #3   Title  PT will negotiate 4 steps using 1 hand rail with step over step sequence for incr. efficiency with step negotiation.    Time  6    Period  Weeks    Status  Achieved      PT LONG TERM GOAL #4   Title  Pt will ambulate 500' on flat even and uneven surfaces without SPC modified independently.    Baseline  pt states she is still using cane but feels that she could walk to her mailbox without cane    Time  6    Period  Weeks    Status  Partially Met      PT LONG TERM GOAL #5   Title  Pt will amb. >/= 850' in 6" walk test to demonstrate increased gait speed and endurance.    [facility] 1012.8' with no device - 01-12-19    Time  6    Period  Weeks    Status  Achieved            Plan - 01/14/19 1150    Clinical Impression Statement  Pt has made excellent progress - Lt knee active flexion = 118 degrees.  Pt amb. to aquatic therapy session today without use of device; pt is discharged due to goals met and program completed.    PT Frequency  2x / week    PT Duration  6 weeks    PT Treatment/Interventions  ADLs/Self Care Home Management;Aquatic Therapy;Balance training;Therapeutic exercise;Therapeutic activities;Gait training;Neuromuscular re-education;Patient/family education    PT Next Visit Plan  balance and strengthening HEP - exercises to increase Lt knee flexion and for Lt knee strengthening:  aquatic therapy initiated on 12-02-18 ( I plan to give aquatic exercises for HEP upon D/C)  Consulted and Agree with Plan of Care  Patient       Patient will  benefit from skilled therapeutic intervention in order to improve the following deficits and impairments:     Visit Diagnosis: Muscle weakness (generalized)  Other abnormalities of gait and mobility  Unsteadiness on feet     Problem List Patient Active Problem List   Diagnosis Date Noted  . Arthritis of left knee 09/22/2018  . Alcohol use, unspecified with unspecified alcohol-induced disorder ([place]) 05/27/2018  . PTSD (post-traumatic stress disorder) 05/30/2016  . Alpha thalassemia trait 05/30/2016  . Hypertension 05/30/2016  . Rotator cuff tendonitis, left 05/30/2016  . Osteoarthritis of left knee 05/30/2016  . Carpal tunnel syndrome 05/30/2016  . Prediabetes 05/30/2016  . Onychomycosis 05/30/2016  . S/P tonsillectomy 05/30/2016  . Goiter, toxic diffuse 05/30/2016  . Chronic tonsillitis 05/30/2016    PHYSICAL THERAPY DISCHARGE SUMMARY  Visits from Start of Care: 12   Current functional level related to goals / functional outcomes: Pt has met LTG's #2,3 and 5:  LTG's #1 and 4 partially met; please see above for progress towards goals - pt has made excellent progress   Remaining deficits: Pt's left knee active ROM is now 118 degrees; pt continues to use [facility] for assistance with long distance community ambulation Continued minimally decreased high level balance skills   Education / Equipment: Pt has been instructed in a HEP for Lt knee strengthening, ROM and stretching exercises.  Pt is also independent in HEP for aquatic exercises.  Plan: Patient agrees to discharge.  Patient goals were met. Patient is being discharged due to meeting the stated rehab goals.  ?????        Alda Lea, PT 01/14/2019, 11:59 AM  [facility] [address] [place], [place], [address] Phone: [phone]   Fax:  [phone]  Name: Emmelyn Schmale MRN: [id number] Date of Birth: 11-02-1955

## 2019-01-18 ENCOUNTER — Other Ambulatory Visit: Payer: Self-pay | Admitting: Family Medicine

## 2019-01-18 DIAGNOSIS — R0602 Shortness of breath: Secondary | ICD-10-CM

## 2019-01-28 ENCOUNTER — Telehealth: Payer: Self-pay | Admitting: Family Medicine

## 2019-01-28 ENCOUNTER — Other Ambulatory Visit: Payer: Self-pay | Admitting: Family Medicine

## 2019-01-28 DIAGNOSIS — Z760 Encounter for issue of repeat prescription: Secondary | ICD-10-CM

## 2019-01-28 MED ORDER — GABAPENTIN 300 MG PO CAPS: 300.0000 mg | ORAL_CAPSULE | Freq: Three times a day (TID) | ORAL | 3 refills | Status: DC

## 2019-01-28 NOTE — Telephone Encounter (Signed)
Notify patient that gabapentin has been refilled to [facility] for 90-day supply with 3 refills

## 2019-01-28 NOTE — Progress Notes (Signed)
Patient ID: Cheryl Prince, female   DOB: August 01, 1955, 62 y.o.   MRN: [id number]   Phone message received from patient requesting gabapentin refills be sent to [facility] for 90-day supply.

## 2019-01-28 NOTE — Telephone Encounter (Signed)
1) Medication(s) Requested (by name):gabapentin (NEURONTIN) 300 MG capsule [[id number]   2) Pharmacy of Choice:  [facility], [place] #:  --     3) Special Requests: Please send a 3 month supply so its free    Approved medications will be sent to the pharmacy, we will reach out if there is an issue.  Requests made after 3pm may not be addressed until the following business day!  If a patient is unsure of the name of the medication(s) please note and ask patient to call back when they are able to provide all info, do not send to responsible party until all information is available!

## 2019-01-29 NOTE — Telephone Encounter (Signed)
UTR 

## 2019-02-02 NOTE — Telephone Encounter (Signed)
UTR per message patient do not have voicemail set up.

## 2019-02-03 NOTE — Telephone Encounter (Signed)
Spoke with patient and informed her with what provider stated and she verbalized understanding.  °

## 2019-02-10 ENCOUNTER — Other Ambulatory Visit: Payer: Self-pay

## 2019-02-10 ENCOUNTER — Encounter: Payer: Self-pay | Admitting: Orthopedic Surgery

## 2019-02-10 ENCOUNTER — Ambulatory Visit (INDEPENDENT_AMBULATORY_CARE_PROVIDER_SITE_OTHER): Payer: Medicare Other | Admitting: Orthopedic Surgery

## 2019-02-10 ENCOUNTER — Ambulatory Visit: Payer: Self-pay

## 2019-02-10 DIAGNOSIS — M12811 Other specific arthropathies, not elsewhere classified, right shoulder: Secondary | ICD-10-CM | POA: Diagnosis not present

## 2019-02-10 DIAGNOSIS — S46012A Strain of muscle(s) and tendon(s) of the rotator cuff of left shoulder, initial encounter: Secondary | ICD-10-CM

## 2019-02-10 MED ORDER — CLINDAMYCIN HCL 300 MG PO CAPS: ORAL_CAPSULE | ORAL | 0 refills | Status: DC

## 2019-02-12 ENCOUNTER — Encounter: Payer: Self-pay | Admitting: Orthopedic Surgery

## 2019-02-12 DIAGNOSIS — M12811 Other specific arthropathies, not elsewhere classified, right shoulder: Secondary | ICD-10-CM

## 2019-02-12 MED ORDER — LIDOCAINE HCL 1 % IJ SOLN
5.0000 mL | INTRAMUSCULAR | Status: AC | PRN
  Administered 2019-02-12: 22:00:00 5 mL

## 2019-02-12 MED ORDER — METHYLPREDNISOLONE ACETATE 40 MG/ML IJ SUSP
40.0000 mg | INTRAMUSCULAR | Status: AC | PRN
  Administered 2019-02-12: 22:00:00 40 mg via INTRA_ARTICULAR

## 2019-02-12 MED ORDER — BUPIVACAINE HCL 0.5 % IJ SOLN
9.0000 mL | INTRAMUSCULAR | Status: AC | PRN
  Administered 2019-02-12: 9 mL via INTRA_ARTICULAR

## 2019-02-12 NOTE — Progress Notes (Signed)
Office Visit Note   Patient: Cheryl Prince           Date of Birth: 1955-08-21           MRN: [id number] Visit Date: 02/10/2019 Requested by: Antony Blackbird, MD [place],  [place] [address] PCP: Antony Blackbird, MD  Subjective: Chief Complaint  Patient presents with  . Left Knee - Follow-up    HPI: Cheryl Prince is a 63 y.o. female who presents to the office complaining of right shoulder pain.  Patient reports right shoulder pain over the last 9 months following a fall.  She denies any acute weakness of the shoulder.  Her pain wakes her up at night and she states that she cannot sleep on her right side.  She takes Tylenol for pain.  She has never had an injection or surgery on the right shoulder.  She has had physical therapy for her left shoulder in the past but this was long ago.  She is able to lift her arm above her head with no deficit.                ROS:  All systems reviewed are negative as they relate to the chief complaint within the history of present illness.  Patient denies fevers or chills.  Assessment & Plan: Visit Diagnoses:  1. Traumatic tear of left rotator cuff, unspecified tear extent, initial encounter     Plan: Patient is a 63 year old female who presents complaining of right shoulder pain over the last 9 months following a fall.  On exam, she has excellent functional range of motion that is equivalent to her contralateral side.  She does have some weakness with supraspinatus resistance testing.  X-rays taken of the right shoulder today reveal chronic rotator cuff tear as evidenced by high riding humerus.  This is not causing patient enough distress or functional limitation to warrant surgical intervention so no MRI was ordered.  Offered injection of the right shoulder.  Patient accepted and the right shoulder was injected with cortisone.  Patient tolerated the procedure well.  She will follow-up with the office as needed.  She may need  reverse shoulder replacement in the future if her symptoms increase and pain and function diminish  Additionally prescribed clindamycin to be taken prior to dental procedures due to her left total knee arthroplasty.  Follow-Up Instructions: No follow-ups on file.   Orders:  Orders Placed This Encounter  Procedures  . XR Shoulder Right   Meds ordered this encounter  Medications  . DISCONTD: clindamycin (CLEOCIN) 300 MG capsule    Sig: Take 2 capsules (600 mg) 30-60 minutes prior to dental procedure    Dispense:  6 capsule    Refill:  0  . clindamycin (CLEOCIN) 300 MG capsule    Sig: Take 2 capsules (600 mg) 30-60 minutes prior to dental procedure    Dispense:  6 capsule    Refill:  0      Procedures: Large Joint Inj: R glenohumeral on 02/12/2019 10:17 PM Indications: diagnostic evaluation and pain Details: 18 G 1.5 in needle, posterior approach  Arthrogram: No  Medications: 9 mL bupivacaine 0.5 %; 40 mg methylPREDNISolone acetate 40 MG/ML; 5 mL lidocaine 1 % Outcome: tolerated well, no immediate complications Procedure, treatment alternatives, risks and benefits explained, specific risks discussed. Consent was given by the patient. Immediately prior to procedure a time out was called to verify the correct patient, procedure, equipment, support staff and site/side marked  as required. Patient was prepped and draped in the usual sterile fashion.       Clinical Data: No additional findings.  Objective: Vital Signs: There were no vitals taken for this visit.  Physical Exam:  Constitutional: Patient appears well-developed HEENT:  Head: Normocephalic Eyes:EOM are normal Neck: Normal range of motion Cardiovascular: Normal rate Pulmonary/chest: Effort normal Neurologic: Patient is alert Skin: Skin is warm Psychiatric: Patient has normal mood and affect  Ortho Exam:  Right shoulder Exam Able to fully forward flex and abduct shoulder overhead No loss of ER relative to  the other shoulder.  Good endpoint with ER No TTP over the [facility] joint or bicipital groove 5/5 motor strength of the infraspinatus and subscapularis muscles.  4/5 motor strength of the supraspinatus muscle. 5/5 grip strength, forearm pronation/supination, and bicep strength  Left knee exam Well-healed surgical incision from prior left total knee arthroplasty 0 degrees of extension No significant effusion Greater than 105 degrees of flexion  Specialty Comments:  No specialty comments available.  Imaging: No results found.   PMFS History: Patient Active Problem List   Diagnosis Date Noted  . Arthritis of left knee 09/22/2018  . Alcohol use, unspecified with unspecified alcohol-induced disorder ([facility]) 05/27/2018  . PTSD (post-traumatic stress disorder) 05/30/2016  . Alpha thalassemia trait 05/30/2016  . Hypertension 05/30/2016  . Rotator cuff tendonitis, left 05/30/2016  . Osteoarthritis of left knee 05/30/2016  . Carpal tunnel syndrome 05/30/2016  . Prediabetes 05/30/2016  . Onychomycosis 05/30/2016  . S/P tonsillectomy 05/30/2016  . Goiter, toxic diffuse 05/30/2016  . Chronic tonsillitis 05/30/2016   Past Medical History:  Diagnosis Date  . Allergy   . Anemia   . Anxiety   . Arthritis   . Asthma    borderline bronchitis  . Bronchitis   . GERD (gastroesophageal reflux disease)   . Hypertension   . Sleep apnea    history of, MD tookpt. off machine 7 yrs, ago    Family History  Problem Relation Age of Onset  . Hypertension Father   . Diabetes Father   . Lupus Daughter   . Colon cancer Maternal Aunt   . Colon cancer Maternal Uncle        4 mat uncles dx colon ca  . Esophageal cancer Neg Hx   . Rectal cancer Neg Hx   . Stomach cancer Neg Hx     Past Surgical History:  Procedure Laterality Date  . ABDOMINAL HYSTERECTOMY    . adnoides    . BREAST SURGERY    . CHOLECYSTECTOMY    . COLONOSCOPY    . DILATION AND CURETTAGE OF UTERUS    . GASTRIC BYPASS  1998  .  IUD REMOVAL    . TONSILLECTOMY    . TOTAL KNEE ARTHROPLASTY Left 09/22/2018  . TOTAL KNEE ARTHROPLASTY Left 09/22/2018   Procedure: LEFT TOTAL KNEE ARTHROPLASTY;  Surgeon: Cammy Copa, MD;  Location: [facility] OR;  Service: Orthopedics;  Laterality: Left;   Social History   Occupational History  . Not on file  Tobacco Use  . Smoking status: Former Smoker    Packs/day: 1.50    Years: 18.00    Pack years: 27.00    Types: Cigarettes    Quit date: 09/16/1984    Years since quitting: 34.4  . Smokeless tobacco: Never Used  . Tobacco comment: quit 1986  Substance and Sexual Activity  . Alcohol use: Yes    Comment: social  . Drug use: No  . Sexual  activity: Not on file

## 2019-03-02 ENCOUNTER — Other Ambulatory Visit: Payer: Self-pay | Admitting: Pharmacist

## 2019-03-02 MED ORDER — HYDROCHLOROTHIAZIDE 25 MG PO TABS: 25.0000 mg | ORAL_TABLET | Freq: Every day | ORAL | 1 refills | Status: DC

## 2019-03-29 ENCOUNTER — Ambulatory Visit: Payer: Medicare Other | Attending: Family Medicine | Admitting: Pharmacist

## 2019-03-29 ENCOUNTER — Other Ambulatory Visit: Payer: Self-pay

## 2019-03-29 VITALS — BP 119/79

## 2019-03-29 DIAGNOSIS — I10 Essential (primary) hypertension: Secondary | ICD-10-CM

## 2019-03-29 DIAGNOSIS — Z0130 Encounter for examination of blood pressure without abnormal findings: Secondary | ICD-10-CM

## 2019-03-29 NOTE — Progress Notes (Signed)
    S:    PCP: Dr. Jillyn Hidden  Patient arrives in good spirits. Presents to the clinic for hypertension evaluation, counseling, and management.  Patient was referred and last seen by Primary Care Provider on 12/18/18.   Patient reports adherence with medications.   Current BP Medications include:   - HCTZ 25 mg daily  Antihypertensives tried in the past include:  - Exforge-HCT  Dietary habits include:  - Admits salt-heavy snacks such as chips - Denies drinking caffeine  Exercise habits include: - Limited d/t recent knee replacement  - Spends ~5 minutes daily on the treadmill  Family / Social history:  - Tobacco: former smoker (quit in 75s) - Alcohol: denies use since ~3 wks prior to knee replacement   O:  Vitals:   03/29/19 1409  BP: 119/79   Home BP readings:  - Needs a new cuff to check at home  Last 3 Office BP readings: BP Readings from Last 3 Encounters:  03/29/19 119/79  12/18/18 (!) 148/87  12/09/18 138/81   BMET    Component Value Date/Time   NA 139 09/17/2018 0938   NA 137 06/30/2018 1019   K 3.9 09/17/2018 0938   CL 105 09/17/2018 0938   CO2 24 09/17/2018 0938   GLUCOSE 90 09/17/2018 0938   BUN 20 09/17/2018 0938   BUN 17 06/30/2018 1019   CREATININE 1.00 09/17/2018 0938   CALCIUM 8.8 (L) 09/17/2018 0938   GFRNONAA 59 (L) 09/17/2018 0938   GFRAA >60 09/17/2018 2831    Renal function: CrCl cannot be calculated (Patient's most recent lab result is older than the maximum 21 days allowed.).  Clinical ASCVD: No  The 10-year ASCVD risk score Denman George DC Jr., et al., 2013) is: 5.5%   Values used to calculate the score:     Age: 64 years     Sex: Female     Is Non-Hispanic African American: Yes     Diabetic: No     Tobacco smoker: No     Systolic Blood Pressure: 119 mmHg     Is BP treated: Yes     HDL Cholesterol: 67 mg/dL     Total Cholesterol: 146 mg/dL  A/P: Hypertension longstanding currently controlled on current medications. BP Goal = <130/80  mmHg. Patient is adherent with HCTZ.  -Continued HCTZ 25 mg daily.  -Counseled on lifestyle modifications for blood pressure control including reduced dietary sodium, increased exercise, adequate sleep  Results reviewed and written information provided. Total time in face-to-face counseling 15 minutes.   F/U Clinic Visit in March w/ PCP.    Butch Penny, PharmD, CPP Clinical Pharmacist [facility] [phone]

## 2019-05-12 ENCOUNTER — Ambulatory Visit: Payer: Medicare Other | Admitting: Family Medicine

## 2019-05-13 ENCOUNTER — Ambulatory Visit: Payer: Medicare Other | Attending: Family Medicine | Admitting: Physician Assistant

## 2019-05-13 ENCOUNTER — Other Ambulatory Visit: Payer: Self-pay

## 2019-05-13 VITALS — BP 126/84 | HR 71 | Temp 98.7°F | Ht 63.0 in | Wt 213.4 lb

## 2019-05-13 DIAGNOSIS — D509 Iron deficiency anemia, unspecified: Secondary | ICD-10-CM | POA: Diagnosis not present

## 2019-05-13 DIAGNOSIS — R112 Nausea with vomiting, unspecified: Secondary | ICD-10-CM

## 2019-05-13 DIAGNOSIS — Z87898 Personal history of other specified conditions: Secondary | ICD-10-CM

## 2019-05-13 DIAGNOSIS — I10 Essential (primary) hypertension: Secondary | ICD-10-CM

## 2019-05-13 MED ORDER — ACETAMINOPHEN 325 MG PO TABS: 325.0000 mg | ORAL_TABLET | Freq: Three times a day (TID) | ORAL | 2 refills | Status: AC

## 2019-05-13 MED ORDER — HYDROCHLOROTHIAZIDE 25 MG PO TABS: 25.0000 mg | ORAL_TABLET | Freq: Every day | ORAL | 1 refills | Status: DC

## 2019-05-13 MED ORDER — PROCHLORPERAZINE MALEATE 5 MG PO TABS: 5.0000 mg | ORAL_TABLET | Freq: Four times a day (QID) | ORAL | 0 refills | Status: DC | PRN

## 2019-05-13 NOTE — Progress Notes (Signed)
Med refills

## 2019-05-14 LAB — COMPREHENSIVE METABOLIC PANEL
ALT: 30 IU/L (ref 0–32)
AST: 32 IU/L (ref 0–40)
Albumin/Globulin Ratio: 1.4 (ref 1.2–2.2)
Albumin: 4.1 g/dL (ref 3.8–4.8)
Alkaline Phosphatase: 143 IU/L — ABNORMAL HIGH (ref 39–117)
BUN/Creatinine Ratio: 21 (ref 12–28)
BUN: 14 mg/dL (ref 8–27)
Bilirubin Total: 0.3 mg/dL (ref 0.0–1.2)
CO2: 25 mmol/L (ref 20–29)
Calcium: 8.9 mg/dL (ref 8.7–10.3)
Chloride: 101 mmol/L (ref 96–106)
Creatinine, Ser: 0.68 mg/dL (ref 0.57–1.00)
GFR calc Af Amer: 106 mL/min/{1.73_m2} (ref >59)
GFR calc non Af Amer: 92 mL/min/{1.73_m2} (ref >59)
Globulin, Total: 3 g/dL (ref 1.5–4.5)
Glucose: 79 mg/dL (ref 65–99)
Potassium: 3.8 mmol/L (ref 3.5–5.2)
Sodium: 138 mmol/L (ref 134–144)
Total Protein: 7.1 g/dL (ref 6.0–8.5)

## 2019-05-14 LAB — CBC WITH DIFFERENTIAL/PLATELET
Basophils Absolute: 0.1 10*3/uL (ref 0.0–0.2)
Basos: 1 %
EOS (ABSOLUTE): 0.1 10*3/uL (ref 0.0–0.4)
Eos: 2 %
Hematocrit: 36.3 % (ref 34.0–46.6)
Hemoglobin: 11.2 g/dL (ref 11.1–15.9)
Immature Grans (Abs): 0 10*3/uL (ref 0.0–0.1)
Immature Granulocytes: 0 %
Lymphocytes Absolute: 1.9 10*3/uL (ref 0.7–3.1)
Lymphs: 45 %
MCH: 23.8 pg — ABNORMAL LOW (ref 26.6–33.0)
MCHC: 30.9 g/dL — ABNORMAL LOW (ref 31.5–35.7)
MCV: 77 fL — ABNORMAL LOW (ref 79–97)
Monocytes Absolute: 0.5 10*3/uL (ref 0.1–0.9)
Monocytes: 11 %
Neutrophils Absolute: 1.7 10*3/uL (ref 1.4–7.0)
Neutrophils: 41 %
Platelets: 244 10*3/uL (ref 150–450)
RBC: 4.71 x10E6/uL (ref 3.77–5.28)
RDW: 14.8 % (ref 11.7–15.4)
WBC: 4.2 10*3/uL (ref 3.4–10.8)

## 2019-05-14 NOTE — Progress Notes (Signed)
Cheryl Prince, is a 64 y.o. female  OTL:[id number]  HRC:[id number]  DOB - 08/02/1955  Subjective:  Chief Complaint and HPI: Cheryl Prince is a 64 y.o. female here today for Rf on prescriptions.  No concerns or complaints.  ROS:   Constitutional:  No f/c, No night sweats, No unexplained weight loss. EENT:  No vision changes, No blurry vision, No hearing changes. No mouth, throat, or ear problems.  Respiratory: No cough, No SOB Cardiac: No CP, no palpitations GI:  No abd pain, No N/V/D. GU: No Urinary s/sx Musculoskeletal: No joint pain Neuro: No headache, no dizziness, no motor weakness.  Skin: No rash Endocrine:  No polydipsia. No polyuria.  Psych: Denies SI/HI  No problems updated.  ALLERGIES: Allergies  Allergen Reactions  . Morphine And Related Swelling  . Penicillins Swelling  . Pine Shortness Of Breath  . Shellfish Allergy Shortness Of Breath  . Other Itching and Nausea And Vomiting    Mushrooms - feels high, and dizzy   . Wheat Bran     Rash, tingling in mouth, vomiting  . Grapeseed Extract [Nutritional Supplements] Rash    grapes  . Latex Itching and Rash    "IF" condom, it makes it painful  . Red Dye Swelling and Rash    The dye for checking thyroid.  Red Cast Dye  . Sulfites Rash    Tingling in mouth    PAST MEDICAL HISTORY: Past Medical History:  Diagnosis Date  . Allergy   . Anemia   . Anxiety   . Arthritis   . Asthma    borderline bronchitis  . Bronchitis   . GERD (gastroesophageal reflux disease)   . Hypertension   . Sleep apnea    history of, MD tookpt. off machine 7 yrs, ago    MEDICATIONS AT HOME: Prior to Admission medications   Medication Sig Start Date End Date Taking? Authorizing Provider  acetaminophen (TYLENOL) 325 MG tablet Take 1 tablet (325 mg total) by mouth every 8 (eight) hours. 05/13/19  Yes Nekeya Briski, Marzella Schlein, PA-C  albuterol (PROAIR HFA) 108 (90 Base) MCG/ACT inhaler Inhale 2 puffs into the lungs every 6 (six)  hours as needed for wheezing or shortness of breath. 07/08/18  Yes Hoy Register, MD  cetirizine (ZYRTEC ALLERGY) 10 MG tablet Take 1 tablet (10 mg total) by mouth daily. Patient taking differently: Take 10 mg by mouth daily as needed for allergies.  05/25/17  Yes Wallis Bamberg, PA-C  clindamycin (CLEOCIN) 300 MG capsule Take 2 capsules (600 mg) 30-60 minutes prior to dental procedure 02/10/19  Yes Magnant, Charles L, PA-C  famotidine (PEPCID) 20 MG tablet Take 1 tablet (20 mg total) by mouth 2 (two) times daily. 11/30/18  Yes Fulp, Cammie, MD  gabapentin (NEURONTIN) 300 MG capsule Take 1 capsule (300 mg total) by mouth 3 (three) times daily. 01/28/19  Yes Fulp, Cammie, MD  hydrochlorothiazide (HYDRODIURIL) 25 MG tablet Take 1 tablet (25 mg total) by mouth daily. 05/13/19  Yes Silvia Markuson M, PA-C  montelukast (SINGULAIR) 10 MG tablet Take 10 mg by mouth daily as needed (wheezing).   Yes [provider]  ferrous sulfate 325 (65 FE) MG tablet Take 1 tablet (325 mg total) by mouth daily with breakfast. Patient taking differently: Take 325 mg by mouth daily with breakfast. Last took 10-13-20202 09/02/18 12/18/18  Fulp, Hewitt Shorts, MD  FLOVENT HFA 110 MCG/ACT inhaler Inhale 2 puffs into the lungs 2 (two) times daily as needed for up to 30 days.  Patient not taking: Reported on 05/13/2019 07/06/18 12/18/18  Kerin Perna, NP  methocarbamol (ROBAXIN) 500 MG tablet Take 1 tablet (500 mg total) by mouth every 8 (eight) hours as needed for muscle spasms. Patient not taking: Reported on 12/09/2018 09/25/18   Magnant, Gerrianne Scale, PA-C  oxyCODONE (ROXICODONE) 5 MG immediate release tablet Take 1 tablet (5 mg total) by mouth daily as needed. Patient not taking: Reported on 12/17/2018 11/16/18 11/16/19  Magnant, Gerrianne Scale, PA-C  oxyCODONE-acetaminophen (PERCOCET/ROXICET) 5-325 MG tablet Take 1 tablet by mouth every 12 (twelve) hours as needed for severe pain. Patient not taking: Reported on 05/13/2019 12/16/18    Meredith Pel, MD  prochlorperazine (COMPAZINE) 5 MG tablet Take 1 tablet (5 mg total) by mouth every 6 (six) hours as needed for nausea or vomiting. 05/13/19   Argentina Donovan, PA-C     Objective:  EXAM:   Vitals:   05/13/19 1602  BP: 126/84  Pulse: 71  Temp: 98.7 F (37.1 C)  SpO2: 99%  Weight: 213 lb 6.4 oz (96.8 kg)  Height: 5\' 3"  (1.6 m)    General appearance : A&OX3. NAD. Non-toxic-appearing HEENT: Atraumatic and Normocephalic.  PERRLA. EOM intact.  Neck: supple, no JVD. No cervical lymphadenopathy. No thyromegaly Chest/Lungs:  Breathing-non-labored, Good air entry bilaterally, breath sounds normal without rales, rhonchi, or wheezing  CVS: S1 S2 regular, no murmurs, gallops, rubs  Extremities: Bilateral Lower Ext shows no edema, both legs are warm to touch with = pulse throughout Neurology:  CN II-XII grossly intact, Non focal.   Psych:  TP linear. J/I WNL. Normal speech. Appropriate eye contact and affect.  Skin:  No Rash  Data Review Lab Results  Component Value Date   HGBA1C 5.8 (A) 12/23/2018     Assessment & Plan   1. Nausea and vomiting, intractability of vomiting not specified, unspecified vomiting type Intermittent;  Just wants RF - prochlorperazine (COMPAZINE) 5 MG tablet; Take 1 tablet (5 mg total) by mouth every 6 (six) hours as needed for nausea or vomiting.  Dispense: 30 tablet; Refill: 0  2. Essential hypertension controlled - hydrochlorothiazide (HYDRODIURIL) 25 MG tablet; Take 1 tablet (25 mg total) by mouth daily.  Dispense: 90 tablet; Refill: 1 - Comprehensive metabolic panel  3. Microcytic anemia - CBC with Differential/Platelet  4. History of prediabetes I have had a lengthy discussion and provided education about insulin resistance and the intake of too much sugar/refined carbohydrates.  I have advised the patient to work at a goal of eliminating sugary drinks, candy, desserts, sweets, refined sugars, processed foods, and white  carbohydrates.  The patient expresses understanding.       Patient have been counseled extensively about nutrition and exercise  Return in about 6 months (around 11/13/2019) for pcp.  The patient was given clear instructions to go to ER or return to medical center if symptoms don't improve, worsen or new problems develop. The patient verbalized understanding. The patient was told to call to get lab results if they haven't heard anything in the next week.     Freeman Caldron, PA-C [facility] [place] [place]   05/14/2019, 1:32 PMPatient ID: [name], female   DOB: [birth year], 64 y.o.   MRN: [id number]

## 2019-05-19 ENCOUNTER — Encounter: Payer: Self-pay | Admitting: *Deleted

## 2019-06-03 ENCOUNTER — Telehealth: Payer: Self-pay | Admitting: Family Medicine

## 2019-06-03 NOTE — Telephone Encounter (Signed)
Pt call to request a referral for a "" Lazy Boy Electric chair" to be sent to [facility], please she has [place] [employer] adviser [employer] advance. Please follow up

## 2019-06-04 NOTE — Telephone Encounter (Signed)
This will need to wait until PCP returns.  Please schedule an appointment for her.  Thank you

## 2019-06-09 ENCOUNTER — Ambulatory Visit: Payer: Medicare Other | Attending: Family Medicine | Admitting: Family Medicine

## 2019-06-09 ENCOUNTER — Other Ambulatory Visit: Payer: Self-pay

## 2019-06-09 ENCOUNTER — Encounter: Payer: Self-pay | Admitting: Family Medicine

## 2019-06-09 VITALS — BP 150/95 | HR 64 | Ht 63.0 in | Wt 213.0 lb

## 2019-06-09 DIAGNOSIS — R112 Nausea with vomiting, unspecified: Secondary | ICD-10-CM

## 2019-06-09 DIAGNOSIS — K219 Gastro-esophageal reflux disease without esophagitis: Secondary | ICD-10-CM | POA: Diagnosis not present

## 2019-06-09 DIAGNOSIS — M12811 Other specific arthropathies, not elsewhere classified, right shoulder: Secondary | ICD-10-CM | POA: Diagnosis not present

## 2019-06-09 MED ORDER — PROCHLORPERAZINE MALEATE 5 MG PO TABS: 5.0000 mg | ORAL_TABLET | Freq: Four times a day (QID) | ORAL | 0 refills | Status: DC | PRN

## 2019-06-09 MED ORDER — MISC. DEVICES MISC: 0 refills | Status: DC

## 2019-06-09 MED ORDER — METHOCARBAMOL 500 MG PO TABS: 500.0000 mg | ORAL_TABLET | Freq: Three times a day (TID) | ORAL | 1 refills | Status: DC | PRN

## 2019-06-09 MED ORDER — FAMOTIDINE 20 MG PO TABS: 20.0000 mg | ORAL_TABLET | Freq: Two times a day (BID) | ORAL | 0 refills | Status: DC

## 2019-06-09 NOTE — Progress Notes (Signed)
Subjective:  Patient ID: Cheryl Prince, female    DOB: Aug 31, 1955  Age: 64 y.o. MRN: [id number]  CC:  Chief Complaint  Patient presents with  . Shoulder Pain     HPI Cheryl Prince presents today for an acute visit requesting an Development worker, community chair.  States she was informed her insurance will cover it. She can't lift her right arm up as she is needing R shoulder surgery.and so she needs an electric lazy boy chair due to difficulty getting up out of her current chair. Right shoulder is currently being managed by orthopedics  Compazine has been used for her nausea since her gastrioc bypass in 2002 and she is requesting a refill. She had received the 2nd Moderna vaccine yesterday and is doing well. Past Medical History:  Diagnosis Date  . Allergy   . Anemia   . Anxiety   . Arthritis   . Asthma    borderline bronchitis  . Bronchitis   . GERD (gastroesophageal reflux disease)   . Hypertension   . Sleep apnea    history of, MD tookpt. off machine 7 yrs, ago    Past Surgical History:  Procedure Laterality Date  . ABDOMINAL HYSTERECTOMY    . adnoides    . BREAST SURGERY    . CHOLECYSTECTOMY    . COLONOSCOPY    . DILATION AND CURETTAGE OF UTERUS    . GASTRIC BYPASS  1998  . IUD REMOVAL    . TONSILLECTOMY    . TOTAL KNEE ARTHROPLASTY Left 09/22/2018  . TOTAL KNEE ARTHROPLASTY Left 09/22/2018   Procedure: LEFT TOTAL KNEE ARTHROPLASTY;  Surgeon: Meredith Pel, MD;  Location: [place];  Service: Orthopedics;  Laterality: Left;    Family History  Problem Relation Age of Onset  . Hypertension Father   . Diabetes Father   . Lupus Daughter   . Colon cancer Maternal Aunt   . Colon cancer Maternal Uncle        4 mat uncles dx colon ca  . Esophageal cancer Neg Hx   . Rectal cancer Neg Hx   . Stomach cancer Neg Hx     Allergies  Allergen Reactions  . Morphine And Related Swelling  . Penicillins Swelling  . Pine Shortness Of Breath  . Shellfish  Allergy Shortness Of Breath  . Other Itching and Nausea And Vomiting    Mushrooms - feels high, and dizzy   . Wheat Bran     Rash, tingling in mouth, vomiting  . Grapeseed Extract [Nutritional Supplements] Rash    grapes  . Latex Itching and Rash    "IF" condom, it makes it painful  . Red Dye Swelling and Rash    The dye for checking thyroid.  Red Cast Dye  . Sulfites Rash    Tingling in mouth    Outpatient Medications Prior to Visit  Medication Sig Dispense Refill  . acetaminophen (TYLENOL) 325 MG tablet Take 1 tablet (325 mg total) by mouth every 8 (eight) hours. 100 tablet 2  . albuterol (PROAIR HFA) 108 (90 Base) MCG/ACT inhaler Inhale 2 puffs into the lungs every 6 (six) hours as needed for wheezing or shortness of breath. 8.5 g 2  . cetirizine (ZYRTEC ALLERGY) 10 MG tablet Take 1 tablet (10 mg total) by mouth daily. (Patient taking differently: Take 10 mg by mouth daily as needed for allergies. ) 90 tablet 1  . famotidine (PEPCID) 20 MG tablet Take 1 tablet (20 mg total) by mouth  2 (two) times daily. 180 tablet 0  . gabapentin (NEURONTIN) 300 MG capsule Take 1 capsule (300 mg total) by mouth 3 (three) times daily. 270 capsule 3  . hydrochlorothiazide (HYDRODIURIL) 25 MG tablet Take 1 tablet (25 mg total) by mouth daily. 90 tablet 1  . montelukast (SINGULAIR) 10 MG tablet Take 10 mg by mouth daily as needed (wheezing).    . prochlorperazine (COMPAZINE) 5 MG tablet Take 1 tablet (5 mg total) by mouth every 6 (six) hours as needed for nausea or vomiting. 30 tablet 0  . clindamycin (CLEOCIN) 300 MG capsule Take 2 capsules (600 mg) 30-60 minutes prior to dental procedure (Patient not taking: Reported on 06/09/2019) 6 capsule 0  . ferrous sulfate 325 (65 FE) MG tablet Take 1 tablet (325 mg total) by mouth daily with breakfast. (Patient taking differently: Take 325 mg by mouth daily with breakfast. Last took 10-13-20202) 90 tablet 1  . FLOVENT HFA 110 MCG/ACT inhaler Inhale 2 puffs into the  lungs 2 (two) times daily as needed for up to 30 days. (Patient not taking: Reported on 05/13/2019) 1 Inhaler 3  . methocarbamol (ROBAXIN) 500 MG tablet Take 1 tablet (500 mg total) by mouth every 8 (eight) hours as needed for muscle spasms. (Patient not taking: Reported on 12/09/2018) 30 tablet 0   No facility-administered medications prior to visit.     ROS Review of Systems  Constitutional: Negative for activity change, appetite change and fatigue.  HENT: Negative for congestion, sinus pressure and sore throat.   Eyes: Negative for visual disturbance.  Respiratory: Negative for cough, chest tightness, shortness of breath and wheezing.   Cardiovascular: Negative for chest pain and palpitations.  Gastrointestinal: Negative for abdominal distention, abdominal pain and constipation.  Endocrine: Negative for polydipsia.  Genitourinary: Negative for dysuria and frequency.  Musculoskeletal:       See HPI  Skin: Negative for rash.  Neurological: Negative for tremors, light-headedness and numbness.  Hematological: Does not bruise/bleed easily.  Psychiatric/Behavioral: Negative for agitation and behavioral problems.    Objective:  BP (!) 150/95   Pulse 64   Ht 5\' 3"  (1.6 m)   Wt 213 lb (96.6 kg)   SpO2 99%   BMI 37.73 kg/m   BP/Weight 06/09/2019 05/13/2019 03/29/2019  Systolic BP 150 126 119  Diastolic BP 95 84 79  Wt. (Lbs) 213 213.4 -  BMI 37.73 37.8 -  Some encounter information is confidential and restricted. Go to Review Flowsheets activity to see all data.      Physical Exam Constitutional:      Appearance: She is well-developed.  Neck:     Vascular: No JVD.  Cardiovascular:     Rate and Rhythm: Normal rate.     Heart sounds: Normal heart sounds. No murmur.  Pulmonary:     Effort: Pulmonary effort is normal.     Breath sounds: Normal breath sounds. No wheezing or rales.  Chest:     Chest wall: No tenderness.  Abdominal:     General: Bowel sounds are normal. There  is no distension.     Palpations: Abdomen is soft. There is no mass.     Tenderness: There is no abdominal tenderness.  Musculoskeletal:        General: Normal range of motion.     Right lower leg: No edema.     Left lower leg: No edema.     Comments: Abduction of right shoulder restricted to 90 degrees; with associated pain  Neurological:  Mental Status: She is alert and oriented to person, place, and time.  Psychiatric:        Mood and Affect: Mood normal.     CMP Latest Ref Rng & Units 05/13/2019 09/17/2018 06/30/2018  Glucose 65 - 99 mg/dL 79 90 90  BUN 8 - 27 mg/dL 14 20 17   Creatinine 0.57 - 1.00 mg/dL 3.66 4.40  Sodium 134 - 144 mmol/L 138 139 137  Potassium 3.5 - 5.2 mmol/L 3.8 3.9 3.7  Chloride 96 - 106 mmol/L 101 105 102  CO2 20 - 29 mmol/L 25 24 22   Calcium 8.7 - 10.3 mg/dL 8.9 3.47) 8.9  Total Protein 6.0 - 8.5 g/dL 7.1 - 7.2  Total Bilirubin 0.0 - 1.2 mg/dL 0.3 - 0.4  Alkaline Phos 39 - 117 IU/L 143(H) - 143(H)  AST 0 - 40 IU/L 32 - 25  ALT 0 - 32 IU/L 30 - 18    Lipid Panel     Component Value Date/Time   CHOL 146 06/30/2018 1019   TRIG 70 06/30/2018 1019   HDL 67 06/30/2018 1019   CHOLHDL 2.2 06/30/2018 1019   LDLCALC 65 06/30/2018 1019    CBC    Component Value Date/Time   WBC 4.2 05/13/2019 1634   WBC 4.4 09/17/2018 0938   RBC 4.71 05/13/2019 1634   RBC 4.66 09/17/2018 0938   HGB 11.2 05/13/2019 1634   HCT 36.3 05/13/2019 1634   PLT 244 05/13/2019 1634   MCV 77 (L) 05/13/2019 1634   MCH 23.8 (L) 05/13/2019 1634   MCH 24.0 (L) 09/17/2018 0938   MCHC 30.9 (L) 05/13/2019 1634   MCHC 31.0 09/17/2018 0938   RDW 14.8 05/13/2019 1634   LYMPHSABS 1.9 05/13/2019 1634   EOSABS 0.1 05/13/2019 1634   BASOSABS 0.1 05/13/2019 1634    Lab Results  Component Value Date   HGBA1C 5.8 (A) 12/23/2018    Assessment & Plan:  1. Nausea and vomiting, intractability of vomiting not specified, unspecified vomiting type - prochlorperazine (COMPAZINE) 5  MG tablet; Take 1 tablet (5 mg total) by mouth every 6 (six) hours as needed for nausea or vomiting.  Dispense: 90 tablet; Refill: 0  2. Rotator cuff arthropathy of right shoulder Prescription has been written for requested Lazy Boy chair She will follow up with orthopedics for her right shoulder - methocarbamol (ROBAXIN) 500 MG tablet; Take 1 tablet (500 mg total) by mouth every 8 (eight) hours as needed for muscle spasms.  Dispense: 90 tablet; Refill: 1 - Misc. Devices MISC; Lazy boy chair. Dx- right rotator cuff arthropathy  Dispense: 1 each; Refill: 0  3. Gastroesophageal reflux disease without esophagitis Stable - famotidine (PEPCID) 20 MG tablet; Take 1 tablet (20 mg total) by mouth 2 (two) times daily.  Dispense: 180 tablet; Refill: 0       05/15/2019, MD, FAAFP. [facility] [place], [facility] [place]   06/09/2019, 9:51 AM

## 2019-06-09 NOTE — Progress Notes (Signed)
Would like a referral for a electric lazy boy chair.  Needs a 90 day supply.

## 2019-06-10 ENCOUNTER — Telehealth: Payer: Self-pay | Admitting: Family Medicine

## 2019-06-10 NOTE — Telephone Encounter (Signed)
Patient came in saying that the prescription that was suppose to be faxed to [facility] has not been received by the company. Patient came in today requesting the the prescription and states she will take it to [facility]. Patient was told that the nurse had left for the day and she stated she would be back tomorrow.

## 2019-06-11 NOTE — Telephone Encounter (Signed)
Order was faxed over to [facility] with recent office note.

## 2019-06-14 ENCOUNTER — Ambulatory Visit (HOSPITAL_COMMUNITY): Payer: Medicare Other | Admitting: Psychiatry

## 2019-06-23 ENCOUNTER — Telehealth: Payer: Self-pay | Admitting: Family Medicine

## 2019-06-23 NOTE — Telephone Encounter (Signed)
Patient stopped by the office today and dropped off the script for her power chair. Patient stated that [facility] needed it in the sequence that has been corrected on the form. Patient also dropped off a note for an allergy letter she is in the need of . Please follow up at your earliest convenience.

## 2019-06-24 NOTE — Telephone Encounter (Signed)
Please contact patient and let her know that she will need to call and schedule a specific appointment for a power wheel chair mobility exam

## 2019-06-28 ENCOUNTER — Telehealth: Payer: Self-pay

## 2019-06-28 NOTE — Telephone Encounter (Signed)
At request of Dr Jillyn Hidden call placed to the patient # [phone],  to schedule appointment for power chair eval.  Message left with call back requested to this CM.

## 2019-06-29 ENCOUNTER — Telehealth: Payer: Self-pay

## 2019-06-29 NOTE — Telephone Encounter (Signed)
Call placed to patient regarding scheduling appointment for power chair.  She said that she does not need a power chair, she would like a lift chair.  She spoke to [facility] about the lift chair  and was informed that the out of pocket expense for the chair would be about $600.  She said she can't afford that and has checked options on-line and will purchase one herself. She said that she found some options for less than $600.   No appointment needed at this time.  She is requesting a letter from Dr Jillyn Hidden for her [facility] stating  she is allergic to pine and cannot have pine needles at her home/yard. This letter can be emailed to :  [email or web address] and the patient would like the letter cc'd to her: [email or web address]

## 2019-06-30 ENCOUNTER — Telehealth: Payer: Self-pay

## 2019-06-30 NOTE — Telephone Encounter (Signed)
Please ask if she has had prior allergy testing showing an allergy to pine/pine needles and if not then I can schedule her will an allergist because proof of her allergy may need to be offered

## 2019-06-30 NOTE — Telephone Encounter (Signed)
Call placed to patient [phone] regarding allergy testing for pine.  Message left requesting a call back to this CM

## 2019-07-04 ENCOUNTER — Ambulatory Visit (HOSPITAL_COMMUNITY)
Admission: EM | Admit: 2019-07-04 | Discharge: 2019-07-04 | Disposition: A | Discharge status: Home / Self Care | Payer: Medicare Other

## 2019-07-04 ENCOUNTER — Encounter (HOSPITAL_COMMUNITY): Payer: Self-pay

## 2019-07-04 ENCOUNTER — Other Ambulatory Visit: Payer: Self-pay

## 2019-07-04 DIAGNOSIS — T7840XA Allergy, unspecified, initial encounter: Secondary | ICD-10-CM

## 2019-07-04 DIAGNOSIS — L500 Allergic urticaria: Secondary | ICD-10-CM | POA: Diagnosis not present

## 2019-07-04 MED ORDER — PREDNISONE 10 MG PO TABS: 40.0000 mg | ORAL_TABLET | Freq: Every day | ORAL | 0 refills | Status: AC

## 2019-07-04 MED ORDER — HYDROXYZINE HCL 25 MG PO TABS: 25.0000 mg | ORAL_TABLET | Freq: Two times a day (BID) | ORAL | 0 refills | Status: DC

## 2019-07-04 NOTE — ED Triage Notes (Addendum)
Pt c/o itching all over bodyx2 days. Pt states she started itching after taking miralax.Pt has used antibiotic cream, but it burns.

## 2019-07-04 NOTE — ED Provider Notes (Signed)
[facility]    CSN: [id number] Arrival date & time: 07/04/19  1018      History   Chief Complaint Chief Complaint  Patient presents with  . allergies    HPI Cheryl Prince is a 64 y.o. female.   Patient history of numerous allergies and GERD reports for evaluation of itchy rash.  She reports this started Friday evening after taking MiraLAX.  She reports she started to have whelps that were itchy.  This is since spread on her arms and legs and her back.  She reports she has several allergies and this is been consistent with other allergic skin rashes.  She reports no difficulty breathing or throat swelling.  She reports she does have an EpiPen and did not feel the need to use it.  She has tried topical hydrocortisone and antibiotic creams.  Antibiotic cream made skin burn.  She reports Benadryl makes her very sleepy and reports issues with blood pressure the next morning after taking Benadryl, so she has tried to avoid that.  She takes Zyrtec daily for allergies currently.  She has not had fevers or chills.  No recent respiratory illnesses or febrile illnesses.     Past Medical History:  Diagnosis Date  . Allergy   . Anemia   . Anxiety   . Arthritis   . Asthma    borderline bronchitis  . Bronchitis   . GERD (gastroesophageal reflux disease)   . Hypertension   . Sleep apnea    history of, MD tookpt. off machine 7 yrs, ago    Patient Active Problem List   Diagnosis Date Noted  . Arthritis of left knee 09/22/2018  . Alcohol use, unspecified with unspecified alcohol-induced disorder (HCC) 05/27/2018  . PTSD (post-traumatic stress disorder) 05/30/2016  . Alpha thalassemia trait 05/30/2016  . Hypertension 05/30/2016  . Rotator cuff tendonitis, left 05/30/2016  . Osteoarthritis of left knee 05/30/2016  . Carpal tunnel syndrome 05/30/2016  . Prediabetes 05/30/2016  . Onychomycosis 05/30/2016  . S/P tonsillectomy 05/30/2016  . Goiter, toxic diffuse  05/30/2016  . Chronic tonsillitis 05/30/2016    Past Surgical History:  Procedure Laterality Date  . ABDOMINAL HYSTERECTOMY    . adnoides    . BREAST SURGERY    . CHOLECYSTECTOMY    . COLONOSCOPY    . DILATION AND CURETTAGE OF UTERUS    . GASTRIC BYPASS  1998  . IUD REMOVAL    . TONSILLECTOMY    . TOTAL KNEE ARTHROPLASTY Left 09/22/2018  . TOTAL KNEE ARTHROPLASTY Left 09/22/2018   Procedure: LEFT TOTAL KNEE ARTHROPLASTY;  Surgeon: Cammy Copa, MD;  Location: [facility] OR;  Service: Orthopedics;  Laterality: Left;    OB History   No obstetric history on file.      Home Medications    Prior to Admission medications   Medication Sig Start Date End Date Taking? Authorizing Provider  Multiple Vitamin (MULTIVITAMIN) tablet Take 1 tablet by mouth daily.   Yes [provider]  acetaminophen (TYLENOL) 325 MG tablet Take 1 tablet (325 mg total) by mouth every 8 (eight) hours. 05/13/19   Anders Simmonds, PA-C  albuterol (PROAIR HFA) 108 (90 Base) MCG/ACT inhaler Inhale 2 puffs into the lungs every 6 (six) hours as needed for wheezing or shortness of breath. 07/08/18   Hoy Register, MD  cetirizine (ZYRTEC ALLERGY) 10 MG tablet Take 1 tablet (10 mg total) by mouth daily. Patient taking differently: Take 10 mg by mouth daily as needed  for allergies.  05/25/17   Jaynee Eagles, PA-C  clindamycin (CLEOCIN) 300 MG capsule Take 2 capsules (600 mg) 30-60 minutes prior to dental procedure Patient not taking: Reported on 06/09/2019 02/10/19   Magnant, Gerrianne Scale, PA-C  famotidine (PEPCID) 20 MG tablet Take 1 tablet (20 mg total) by mouth 2 (two) times daily. 06/09/19   Charlott Rakes, MD  ferrous sulfate 325 (65 FE) MG tablet Take 1 tablet (325 mg total) by mouth daily with breakfast. Patient taking differently: Take 325 mg by mouth daily with breakfast. Last took 10-13-20202 09/02/18 12/18/18  Fulp, Ander Gaster, MD  FLOVENT HFA 110 MCG/ACT inhaler Inhale 2 puffs into the lungs 2 (two) times daily  as needed for up to 30 days. Patient not taking: Reported on 05/13/2019 07/06/18 12/18/18  Kerin Perna, NP  gabapentin (NEURONTIN) 300 MG capsule Take 1 capsule (300 mg total) by mouth 3 (three) times daily. 01/28/19   Fulp, Cammie, MD  hydrochlorothiazide (HYDRODIURIL) 25 MG tablet Take 1 tablet (25 mg total) by mouth daily. 05/13/19   Argentina Donovan, PA-C  hydrOXYzine (ATARAX/VISTARIL) 25 MG tablet Take 1 tablet (25 mg total) by mouth in the morning and at bedtime. 07/04/19   Kiosha Buchan, Marguerita Beards, PA-C  methocarbamol (ROBAXIN) 500 MG tablet Take 1 tablet (500 mg total) by mouth every 8 (eight) hours as needed for muscle spasms. 06/09/19   Charlott Rakes, MD  Misc. Devices MISC Lazy boy chair. Dx- right rotator cuff arthropathy 06/09/19   Charlott Rakes, MD  montelukast (SINGULAIR) 10 MG tablet Take 10 mg by mouth daily as needed (wheezing).    [provider]  predniSONE (DELTASONE) 10 MG tablet Take 4 tablets (40 mg total) by mouth daily with breakfast for 3 days. 07/04/19 07/07/19  Zaeem Kandel, Marguerita Beards, PA-C  prochlorperazine (COMPAZINE) 5 MG tablet Take 1 tablet (5 mg total) by mouth every 6 (six) hours as needed for nausea or vomiting. 06/09/19   Charlott Rakes, MD    Family History Family History  Problem Relation Age of Onset  . Hypertension Father   . Diabetes Father   . Lupus Daughter   . Colon cancer Maternal Aunt   . Colon cancer Maternal Uncle        4 mat uncles dx colon ca  . Esophageal cancer Neg Hx   . Rectal cancer Neg Hx   . Stomach cancer Neg Hx     Social History Social History   Tobacco Use  . Smoking status: Former Smoker    Packs/day: 1.50    Years: 18.00    Pack years: 27.00    Types: Cigarettes    Quit date: 09/16/1984    Years since quitting: 34.8  . Smokeless tobacco: Never Used  . Tobacco comment: quit 1986  Substance Use Topics  . Alcohol use: Yes    Comment: social  . Drug use: No     Allergies   Morphine and related, Penicillins, Pine,  Shellfish allergy, Miralax [polyethylene glycol], Other, Wheat bran, Grapeseed extract [nutritional supplements], Latex, Red dye, and Sulfites   Review of Systems Review of Systems  Per HPI Physical Exam Triage Vital Signs ED Triage Vitals [07/04/19 1035]  Enc Vitals Group     BP 112/82     Pulse Rate 79     Resp 18     Temp 99 F (37.2 C)     Temp Source Oral     SpO2 97 %     Weight 207 lb (93.9 kg)  Height 5\' 3"  (1.6 m)     Head Circumference      Peak Flow      Pain Score 0     Pain Loc      Pain Edu?      Excl. in [place]?    No data found.  Updated Vital Signs BP 112/82   Pulse 79   Temp 99 F (37.2 C) (Oral)   Resp 18   Ht 5\' 3"  (1.6 m)   Wt 207 lb (93.9 kg)   SpO2 97%   BMI 36.67 kg/m   Visual Acuity Right Eye Distance:   Left Eye Distance:   Bilateral Distance:    Right Eye Near:   Left Eye Near:    Bilateral Near:     Physical Exam Vitals and nursing note reviewed.  Constitutional:      General: She is not in acute distress.    Appearance: Normal appearance. She is well-developed. She is not ill-appearing.  HENT:     Head: Normocephalic and atraumatic.     Mouth/Throat:     Mouth: Mucous membranes are moist.     Pharynx: Oropharynx is clear.     Comments: No angioedema or pharyngeal swelling Eyes:     Extraocular Movements: Extraocular movements intact.     Conjunctiva/sclera: Conjunctivae normal.     Pupils: Pupils are equal, round, and reactive to light.  Cardiovascular:     Rate and Rhythm: Normal rate and regular rhythm.     Heart sounds: No murmur.  Pulmonary:     Effort: Pulmonary effort is normal. No respiratory distress.     Breath sounds: Normal breath sounds. No stridor. No wheezing or rales.  Musculoskeletal:     Cervical back: Neck supple.  Skin:    General: Skin is warm and dry.     Comments: There is a raised erythematous blanching rash throughout upper extremities.  There is some dermatographia on the upper extremities.   Similar rash on lower extremities and back.  Minimal facial and neck involvement.-Rashes consistent with urticaria  Neurological:     General: No focal deficit present.     Mental Status: She is alert and oriented to person, place, and time.      UC Treatments / Results  Labs (all labs ordered are listed, but only abnormal results are displayed) Labs Reviewed - No data to display  EKG   Radiology No results found.  Procedures Procedures (including critical care time)  Medications Ordered in UC Medications - No data to display  Initial Impression / Assessment and Plan / UC Course  I have reviewed the triage vital signs and the nursing notes.  Pertinent labs & imaging results that were available during my care of the patient were reviewed by me and considered in my medical decision making (see chart for details).     #Allergic urticaria #Allergic reaction Patient is a 64 year old presenting with allergic urticaria.  She reports her new exposure was MiraLAX and believes this to be the calls.  She has since stopped taking this.  Given widespread and patient reported good tolerance to prednisone for previous episodes we will do 4-day course of prednisone.  Hydroxyzine twice daily as needed and to continue Zyrtec.  Recommended taking 1 Benadryl tonight.  Patient to follow-up with primary care or return if not improved after 3 days.  Emergency department precautions for acutely worsening or development of respiratory symptoms.  Patient verbalized understanding when she reports she does have an  EpiPen and understands the reasons to use this. Final Clinical Impressions(s) / UC Diagnoses   Final diagnoses:  Allergic urticaria  Allergic reaction, initial encounter     Discharge Instructions     Take the prednisone in the morning for 4 days Take the hydroxyzine up to 2 times a day for itch Continue zyrtec Take your pepcid as prescribed 2 times a day until rash resolves  Take 1  benadryl tonight prior to going to bed  If not improving over the next 3 days, please return or follow up with primary care  If worsening rash, shortness of breath or feeling of throat swelling return or report to the Emergency Department        ED Prescriptions    Medication Sig Dispense Auth. Provider   predniSONE (DELTASONE) 10 MG tablet Take 4 tablets (40 mg total) by mouth daily with breakfast for 3 days. 15 tablet Veola Cafaro, Veryl Speak, PA-C   hydrOXYzine (ATARAX/VISTARIL) 25 MG tablet Take 1 tablet (25 mg total) by mouth in the morning and at bedtime. 8 tablet Keandrea Tapley, Veryl Speak, PA-C     PDMP not reviewed this encounter.   Hermelinda Medicus, PA-C 07/04/19 1135

## 2019-07-04 NOTE — Discharge Instructions (Signed)
Take the prednisone in the morning for 4 days Take the hydroxyzine up to 2 times a day for itch Continue zyrtec Take your pepcid as prescribed 2 times a day until rash resolves  Take 1 benadryl tonight prior to going to bed  If not improving over the next 3 days, please return or follow up with primary care  If worsening rash, shortness of breath or feeling of throat swelling return or report to the Emergency Department

## 2019-07-06 NOTE — Telephone Encounter (Signed)
That's fine. I did not realize that she already saw Dr. Alvis Lemmings about this issue

## 2019-07-06 NOTE — Telephone Encounter (Signed)
Spoke with patient and she stated she already saw Dr. Alvis Lemmings for this issue and feels like she should not have to resch another appt and just need Dr. Alvis Lemmings to modify the form or complete the form for her without her having to come back to the office or be seen again.

## 2019-07-06 NOTE — Telephone Encounter (Signed)
Call placed to patient regarding allergy testing.  She said that she was tested for the pine allergy in [place] prior to moving to [place]. She said that the property manager has placed wood chips in her yard this year but would like a letter noting the allergy to support the need to avoid pine needles,   She also explained that she was seen in the ED 07/04/2019 for an allergic reaction.  She was prescribed prednisone for 4 days.  She said that the rash is only very slightly improved.  In the past she has been on a prednisone taper and she thinks that is what she needs now.  She is concerned about just stopping the prednisone without the taper.   Please advise

## 2019-07-06 NOTE — Telephone Encounter (Signed)
I do not see a recent ED note in her chart. Can you find out where she went. Does she the records regarding her allergy to pine?

## 2019-07-08 NOTE — Telephone Encounter (Signed)
Call placed to patient.  She said that she gave the records about her allergy testing to Franki Cabot at [facility] and noted that they should be in her medical record.   Regarding the allergic reaction, she said she is done with the prednisone.  She still has the itching and redness but there has been slight improvement.

## 2019-07-14 NOTE — Telephone Encounter (Signed)
Call received from patient.  She said that she would contact her provider in [place] regarding the allergy testing results and would have them faxed to [facility]

## 2019-08-09 ENCOUNTER — Other Ambulatory Visit: Payer: Self-pay

## 2019-08-09 ENCOUNTER — Ambulatory Visit (HOSPITAL_COMMUNITY)
Admission: EM | Admit: 2019-08-09 | Discharge: 2019-08-09 | Disposition: A | Discharge status: Home / Self Care | Payer: Medicare Other | Attending: Family Medicine | Admitting: Family Medicine

## 2019-08-09 ENCOUNTER — Encounter (HOSPITAL_COMMUNITY): Payer: Self-pay

## 2019-08-09 DIAGNOSIS — H1032 Unspecified acute conjunctivitis, left eye: Secondary | ICD-10-CM

## 2019-08-09 MED ORDER — NEOMYCIN-POLYMYXIN-HC 3.5-10000-1 OP SUSP: 2.0000 [drp] | Freq: Two times a day (BID) | OPHTHALMIC | 0 refills | Status: AC

## 2019-08-09 NOTE — ED Provider Notes (Signed)
[facility]    CSN: [id number] Arrival date & time: 08/09/19  1052      History   Chief Complaint Chief Complaint  Patient presents with   Eye Problem    HPI Cheryl Prince is a 64 y.o. female.   HPI  Patient presents today with itching and irritation and persistent drainage from left eye x4 days.  This morning she grew concerned as her eye was crusted over upon awakening and she is having persistent tearing.  Denies sensation of foreign body. She is having some mild photophobia.  Denies any other visual changes.  Past Medical History:  Diagnosis Date   Allergy    Anemia    Anxiety    Arthritis    Asthma    borderline bronchitis   Bronchitis    GERD (gastroesophageal reflux disease)    Hypertension    Sleep apnea    history of, MD tookpt. off machine 7 yrs, ago    Patient Active Problem List   Diagnosis Date Noted   Arthritis of left knee 09/22/2018   Alcohol use, unspecified with unspecified alcohol-induced disorder (HCC) 05/27/2018   PTSD (post-traumatic stress disorder) 05/30/2016   Alpha thalassemia trait 05/30/2016   Hypertension 05/30/2016   Rotator cuff tendonitis, left 05/30/2016   Osteoarthritis of left knee 05/30/2016   Carpal tunnel syndrome 05/30/2016   Prediabetes 05/30/2016   Onychomycosis 05/30/2016   S/P tonsillectomy 05/30/2016   Goiter, toxic diffuse 05/30/2016   Chronic tonsillitis 05/30/2016    Past Surgical History:  Procedure Laterality Date   ABDOMINAL HYSTERECTOMY     adnoides     BREAST SURGERY     CHOLECYSTECTOMY     COLONOSCOPY     DILATION AND CURETTAGE OF UTERUS     GASTRIC BYPASS  1998   IUD REMOVAL     TONSILLECTOMY     TOTAL KNEE ARTHROPLASTY Left 09/22/2018   TOTAL KNEE ARTHROPLASTY Left 09/22/2018   Procedure: LEFT TOTAL KNEE ARTHROPLASTY;  Surgeon: Cammy Copa, MD;  Location: [facility];  Service: Orthopedics;  Laterality: Left;    OB History   No obstetric  history on file.      Home Medications    Prior to Admission medications   Medication Sig Start Date End Date Taking? Authorizing Provider  acetaminophen (TYLENOL) 325 MG tablet Take 1 tablet (325 mg total) by mouth every 8 (eight) hours. 05/13/19   Anders Simmonds, PA-C  albuterol (PROAIR HFA) 108 (90 Base) MCG/ACT inhaler Inhale 2 puffs into the lungs every 6 (six) hours as needed for wheezing or shortness of breath. 07/08/18   Hoy Register, MD  cetirizine (ZYRTEC ALLERGY) 10 MG tablet Take 1 tablet (10 mg total) by mouth daily. Patient taking differently: Take 10 mg by mouth daily as needed for allergies.  05/25/17   Wallis Bamberg, PA-C  clindamycin (CLEOCIN) 300 MG capsule Take 2 capsules (600 mg) 30-60 minutes prior to dental procedure Patient not taking: Reported on 06/09/2019 02/10/19   Magnant, Joycie Peek, PA-C  famotidine (PEPCID) 20 MG tablet Take 1 tablet (20 mg total) by mouth 2 (two) times daily. 06/09/19   Hoy Register, MD  ferrous sulfate 325 (65 FE) MG tablet Take 1 tablet (325 mg total) by mouth daily with breakfast. Patient taking differently: Take 325 mg by mouth daily with breakfast. Last took 10-13-20202 09/02/18 12/18/18  Fulp, Hewitt Shorts, MD  FLOVENT HFA 110 MCG/ACT inhaler Inhale 2 puffs into the lungs 2 (two) times daily as needed  for up to 30 days. Patient not taking: Reported on 05/13/2019 07/06/18 12/18/18  Kerin Perna, NP  gabapentin (NEURONTIN) 300 MG capsule Take 1 capsule (300 mg total) by mouth 3 (three) times daily. 01/28/19   Fulp, Cammie, MD  hydrochlorothiazide (HYDRODIURIL) 25 MG tablet Take 1 tablet (25 mg total) by mouth daily. 05/13/19   Argentina Donovan, PA-C  hydrOXYzine (ATARAX/VISTARIL) 25 MG tablet Take 1 tablet (25 mg total) by mouth in the morning and at bedtime. 07/04/19   Darr, Marguerita Beards, PA-C  methocarbamol (ROBAXIN) 500 MG tablet Take 1 tablet (500 mg total) by mouth every 8 (eight) hours as needed for muscle spasms. 06/09/19   Charlott Rakes, MD   Misc. Devices MISC Lazy boy chair. Dx- right rotator cuff arthropathy 06/09/19   Charlott Rakes, MD  montelukast (SINGULAIR) 10 MG tablet Take 10 mg by mouth daily as needed (wheezing).    [provider]  Multiple Vitamin (MULTIVITAMIN) tablet Take 1 tablet by mouth daily.    [provider]  prochlorperazine (COMPAZINE) 5 MG tablet Take 1 tablet (5 mg total) by mouth every 6 (six) hours as needed for nausea or vomiting. 06/09/19   Charlott Rakes, MD    Family History Family History  Problem Relation Age of Onset   Hypertension Father    Diabetes Father    Lupus Daughter    Colon cancer Maternal Aunt    Colon cancer Maternal Uncle        4 mat uncles dx colon ca   Esophageal cancer Neg Hx    Rectal cancer Neg Hx    Stomach cancer Neg Hx     Social History Social History   Tobacco Use   Smoking status: Former Smoker    Packs/day: 1.50    Years: 18.00    Pack years: 27.00    Types: Cigarettes    Quit date: 09/16/1984    Years since quitting: 34.9   Smokeless tobacco: Never Used   Tobacco comment: quit 1986  Vaping Use   Vaping Use: Never used  Substance Use Topics   Alcohol use: Yes    Comment: social   Drug use: No     Allergies   Morphine and related, Penicillins, Pine, Shellfish allergy, Miralax [polyethylene glycol], Other, Wheat bran, Grapeseed extract [nutritional supplements], Latex, Red dye, and Sulfites   Review of Systems Review of Systems  Pertinent negatives listed in HPI Physical Exam Triage Vital Signs ED Triage Vitals  Enc Vitals Group     BP 08/09/19 1202 139/83     Pulse Rate 08/09/19 1202 75     Resp 08/09/19 1202 20     Temp 08/09/19 1202 98.3 F (36.8 C)     Temp Source 08/09/19 1202 Oral     SpO2 08/09/19 1202 98 %     Weight --      Height --      Head Circumference --      Peak Flow --      Pain Score 08/09/19 1200 2     Pain Loc --      Pain Edu? --      Excl. in [place]? --    No data  found.  Updated Vital Signs BP 139/83 (BP Location: Left Arm)    Pulse 75    Temp 98.3 F (36.8 C) (Oral)    Resp 20    SpO2 98%   Visual Acuity Right Eye Distance: 20/20 (without correction. ) Left Eye Distance:  20/30 (without correction. ) Bilateral Distance: 20/20 (without correction. )  Right Eye Near:   Left Eye Near:    Bilateral Near:     Physical Exam Constitutional:      Appearance: Normal appearance.  Eyes:     General: Vision grossly intact.        Left eye: Hordeolum present.    Extraocular Movements:     Left eye: Normal extraocular motion and no nystagmus.     Conjunctiva/sclera:     Left eye: Left conjunctiva is injected. No exudate or hemorrhage.    Comments: Clear eye drainage present and crusting present upper and lower eye lashes  Cardiovascular:     Rate and Rhythm: Normal rate.  Pulmonary:     Effort: Pulmonary effort is normal.     Breath sounds: Normal breath sounds and air entry.  Neurological:     Mental Status: She is alert.     UC Treatments / Results  Labs (all labs ordered are listed, but only abnormal results are displayed) Labs Reviewed - No data to display  EKG   Radiology No results found.  Procedures Procedures (including critical care time)  Medications Ordered in UC Medications - No data to display  Initial Impression / Assessment and Plan / UC Course  I have reviewed the triage vital signs and the nursing notes.  Pertinent labs & imaging results that were available during my care of the patient were reviewed by me and considered in my medical decision making (see chart for details).     Treating for acute bacterial conjunctivitis involving the left eye with Cortisporin drops 2 drops to left eye twice daily x 5 days.  Red flags discussed.  Strict return precautions given.  Patient verbalized understanding and agreement with plan. Final Clinical Impressions(s) / UC Diagnoses   Final diagnoses:  Acute bacterial  conjunctivitis of left eye   Discharge Instructions   None    ED Prescriptions    Medication Sig Dispense Auth. Provider   neomycin-polymyxin-hydrocortisone (CORTISPORIN) 3.5-10000-1 ophthalmic suspension Place 2 drops into the left eye in the morning and at bedtime for 5 days. 3 mL Bing Neighbors, FNP     PDMP not reviewed this encounter.   Bing Neighbors, FNP 08/09/19 1246

## 2019-08-09 NOTE — ED Triage Notes (Signed)
Pt presents to UC with mild discomfort, redness and itchy in the left eye x 4 days. Pt denies any discharge.

## 2019-08-10 ENCOUNTER — Other Ambulatory Visit: Payer: Self-pay | Admitting: Family Medicine

## 2019-08-10 DIAGNOSIS — K219 Gastro-esophageal reflux disease without esophagitis: Secondary | ICD-10-CM

## 2019-10-01 ENCOUNTER — Other Ambulatory Visit: Payer: Self-pay | Admitting: Physician Assistant

## 2019-10-01 DIAGNOSIS — I10 Essential (primary) hypertension: Secondary | ICD-10-CM

## 2019-10-25 ENCOUNTER — Other Ambulatory Visit: Payer: Self-pay | Admitting: Primary Care

## 2019-10-25 DIAGNOSIS — Z760 Encounter for issue of repeat prescription: Secondary | ICD-10-CM

## 2019-10-25 NOTE — Telephone Encounter (Signed)
Requested medication (s) are due for refill today- unsure  Requested medication (s) are on the active medication list -yes  Future visit scheduled -no  Last refill: 05/10/19  Notes to clinic: Request for medication no current on list at last visit- #8 provided 05/10/19- sent for review of request  Requested Prescriptions  Pending Prescriptions Disp Refills   hydrOXYzine (ATARAX/VISTARIL) 10 MG tablet [Pharmacy Med Name: HYDROXYZINE HYDROCHLORIDE  10MG   TAB] 30 tablet 3    Sig: TAKE 1 TABLET BY MOUTH  DAILY AS NEEDED FOR ITCHING OR ANXIETY.      Ear, Nose, and Throat:  Antihistamines Passed - 10/25/2019  9:41 AM      Passed - Valid encounter within last 12 months    Recent Outpatient Visits           4 months ago Rotator cuff arthropathy of right shoulder   [facility] 10/27/2019, [place]   5 months ago Essential hypertension   [facility] [place], [place], [place]   7 months ago Essential hypertension   [facility] [place], [place], RPH-CPP   10 months ago Essential hypertension   [facility] [place], [place], [place]   12 months ago Essential hypertension   [facility], RPH-CPP                  Requested Prescriptions  Pending Prescriptions Disp Refills   hydrOXYzine (ATARAX/VISTARIL) 10 MG tablet [Pharmacy Med Name: HYDROXYZINE HYDROCHLORIDE  10MG   TAB] 30 tablet 3    Sig: TAKE 1 TABLET BY MOUTH  DAILY AS NEEDED FOR ITCHING OR ANXIETY.      Ear, Nose, and Throat:  Antihistamines Passed - 10/25/2019  9:41 AM      Passed - Valid encounter within last 12 months    Recent Outpatient Visits           4 months ago Rotator cuff arthropathy of right shoulder   [facility] [place], 10/27/2019, MD   5 months ago Essential hypertension   [facility]  [place], [place], [place]   7 months ago Essential hypertension   [facility] [place], [facility], RPH-CPP   10 months ago Essential hypertension   [facility] [place], [place], MD   12 months ago Essential hypertension   [facility] [place], [facility], RPH-CPP

## 2019-11-11 ENCOUNTER — Telehealth: Payer: Self-pay | Admitting: Family Medicine

## 2019-11-11 NOTE — Telephone Encounter (Signed)
Patient came in to drop off SCAT forms to be filled out by PCP. Forms will be placed in PCP box.

## 2019-11-11 NOTE — Telephone Encounter (Signed)
Forms were forwarded to Erskine Squibb to help with form completion

## 2019-11-17 ENCOUNTER — Telehealth: Payer: Self-pay

## 2019-11-17 NOTE — Telephone Encounter (Signed)
Call placed to patient and completed SCAT application.

## 2019-11-18 NOTE — Telephone Encounter (Signed)
Completed SCAT application faxed to [employer] [place] eligibility.  

## 2019-12-20 ENCOUNTER — Other Ambulatory Visit: Payer: Self-pay

## 2019-12-20 ENCOUNTER — Encounter (HOSPITAL_COMMUNITY): Payer: Self-pay

## 2019-12-20 ENCOUNTER — Ambulatory Visit (HOSPITAL_COMMUNITY)
Admission: EM | Admit: 2019-12-20 | Discharge: 2019-12-20 | Disposition: A | Discharge status: Home / Self Care | Payer: Medicare Other | Attending: Family Medicine | Admitting: Family Medicine

## 2019-12-20 DIAGNOSIS — J0100 Acute maxillary sinusitis, unspecified: Secondary | ICD-10-CM

## 2019-12-20 MED ORDER — FLUTICASONE PROPIONATE 50 MCG/ACT NA SUSP
2.0000 | Freq: Every day | NASAL | 2 refills | Status: DC
Start: 2019-12-20 — End: 2020-02-17

## 2019-12-20 MED ORDER — DOXYCYCLINE HYCLATE 100 MG PO CAPS
100.0000 mg | ORAL_CAPSULE | Freq: Two times a day (BID) | ORAL | 0 refills | Status: DC
Start: 2019-12-20 — End: 2020-02-17

## 2019-12-20 NOTE — Discharge Instructions (Signed)
Take the doxycycline antibiotic 2 x a day You have taken this successfully in the past Use the flonase as directed Continue to drink plenty of water

## 2019-12-20 NOTE — ED Provider Notes (Signed)
[facility]    CSN: [id number] Arrival date & time: 12/20/19  1149      History   Chief Complaint Chief Complaint  Patient presents with  . Headache  . Nasal Congestion    HPI Cheryl Prince is a 64 y.o. female.   HPI  History of sinus infections Currently with sinus pressure and drainage for 3 weeks Has used OTC medicines Over the last few days is worse, with thick brown drainage and fever Pressure and pain in cheeks left over right Nasal congestion Is out of flonase No cough or chest congestion Patient has had 2 Covid vaccinations   Past Medical History:  Diagnosis Date  . Allergy   . Anemia   . Anxiety   . Arthritis   . Asthma    borderline bronchitis  . Bronchitis   . GERD (gastroesophageal reflux disease)   . Hypertension   . Sleep apnea    history of, MD tookpt. off machine 7 yrs, ago    Patient Active Problem List   Diagnosis Date Noted  . Arthritis of left knee 09/22/2018  . Alcohol use, unspecified with unspecified alcohol-induced disorder (HCC) 05/27/2018  . PTSD (post-traumatic stress disorder) 05/30/2016  . Alpha thalassemia trait 05/30/2016  . Hypertension 05/30/2016  . Rotator cuff tendonitis, left 05/30/2016  . Osteoarthritis of left knee 05/30/2016  . Carpal tunnel syndrome 05/30/2016  . Prediabetes 05/30/2016  . Onychomycosis 05/30/2016  . S/P tonsillectomy 05/30/2016  . Goiter, toxic diffuse 05/30/2016  . Chronic tonsillitis 05/30/2016    Past Surgical History:  Procedure Laterality Date  . ABDOMINAL HYSTERECTOMY    . adnoides    . BREAST SURGERY    . CHOLECYSTECTOMY    . COLONOSCOPY    . DILATION AND CURETTAGE OF UTERUS    . GASTRIC BYPASS  1998  . IUD REMOVAL    . TONSILLECTOMY    . TOTAL KNEE ARTHROPLASTY Left 09/22/2018  . TOTAL KNEE ARTHROPLASTY Left 09/22/2018   Procedure: LEFT TOTAL KNEE ARTHROPLASTY;  Surgeon: Cammy Copa, MD;  Location: [facility] OR;  Service: Orthopedics;  Laterality: Left;     OB History   No obstetric history on file.      Home Medications    Prior to Admission medications   Medication Sig Start Date End Date Taking? Authorizing Provider  acetaminophen (TYLENOL) 325 MG tablet Take 1 tablet (325 mg total) by mouth every 8 (eight) hours. 05/13/19   Anders Simmonds, PA-C  albuterol (PROAIR HFA) 108 (90 Base) MCG/ACT inhaler Inhale 2 puffs into the lungs every 6 (six) hours as needed for wheezing or shortness of breath. 07/08/18   Hoy Register, MD  cetirizine (ZYRTEC ALLERGY) 10 MG tablet Take 1 tablet (10 mg total) by mouth daily. Patient taking differently: Take 10 mg by mouth daily as needed for allergies.  05/25/17   Wallis Bamberg, PA-C  clindamycin (CLEOCIN) 300 MG capsule Take 2 capsules (600 mg) 30-60 minutes prior to dental procedure Patient not taking: Reported on 06/09/2019 02/10/19   Magnant, Joycie Peek, PA-C  doxycycline (VIBRAMYCIN) 100 MG capsule Take 1 capsule (100 mg total) by mouth 2 (two) times daily. 12/20/19   Eustace Moore, MD  famotidine (PEPCID) 20 MG tablet TAKE 1 TABLET BY MOUTH  TWICE DAILY 08/11/19   Fulp, Cammie, MD  ferrous sulfate 325 (65 FE) MG tablet Take 1 tablet (325 mg total) by mouth daily with breakfast. Patient taking differently: Take 325 mg by mouth daily with breakfast.  Last took 10-13-20202 09/02/18 12/18/18  Cain Saupe, MD  FLOVENT HFA 110 MCG/ACT inhaler Inhale 2 puffs into the lungs 2 (two) times daily as needed for up to 30 days. Patient not taking: Reported on 05/13/2019 07/06/18 12/18/18  Grayce Sessions, NP  fluticasone [facility]) 50 MCG/ACT nasal spray Place 2 sprays into both nostrils daily. 12/20/19   Eustace Moore, MD  gabapentin (NEURONTIN) 300 MG capsule Take 1 capsule (300 mg total) by mouth 3 (three) times daily. 01/28/19   Fulp, Cammie, MD  hydrochlorothiazide (HYDRODIURIL) 25 MG tablet TAKE 1 TABLET BY MOUTH  DAILY 10/01/19   Fulp, Cammie, MD  hydrOXYzine (ATARAX/VISTARIL) 25 MG tablet Take 1  tablet (25 mg total) by mouth in the morning and at bedtime. 07/04/19   Darr, Veryl Speak, PA-C  methocarbamol (ROBAXIN) 500 MG tablet Take 1 tablet (500 mg total) by mouth every 8 (eight) hours as needed for muscle spasms. 06/09/19   Hoy Register, MD  Misc. Devices MISC Lazy boy chair. Dx- right rotator cuff arthropathy 06/09/19   Hoy Register, MD  montelukast (SINGULAIR) 10 MG tablet Take 10 mg by mouth daily as needed (wheezing).    [provider]  Multiple Vitamin (MULTIVITAMIN) tablet Take 1 tablet by mouth daily.    [provider]  prochlorperazine (COMPAZINE) 5 MG tablet Take 1 tablet (5 mg total) by mouth every 6 (six) hours as needed for nausea or vomiting. 06/09/19   Hoy Register, MD    Family History Family History  Problem Relation Age of Onset  . Hypertension Father   . Diabetes Father   . Lupus Daughter   . Colon cancer Maternal Aunt   . Colon cancer Maternal Uncle        4 mat uncles dx colon ca  . Esophageal cancer Neg Hx   . Rectal cancer Neg Hx   . Stomach cancer Neg Hx     Social History Social History   Tobacco Use  . Smoking status: Former Smoker    Packs/day: 1.50    Years: 18.00    Pack years: 27.00    Types: Cigarettes    Quit date: 09/16/1984    Years since quitting: 35.2  . Smokeless tobacco: Never Used  . Tobacco comment: quit 1986  Vaping Use  . Vaping Use: Never used  Substance Use Topics  . Alcohol use: Yes    Comment: social  . Drug use: No     Allergies   Morphine and related, Penicillins, Pine, Shellfish allergy, Miralax [polyethylene glycol], Other, Wheat bran, Grapeseed extract [nutritional supplements], Latex, Red dye, and Sulfites   Review of Systems Review of Systems  See HPI Physical Exam Triage Vital Signs ED Triage Vitals  Enc Vitals Group     BP 12/20/19 1443 132/87     Pulse Rate 12/20/19 1443 87     Resp 12/20/19 1443 (!) 23     Temp 12/20/19 1443 97.7 F (36.5 C)     Temp Source 12/20/19 1443  Oral     SpO2 12/20/19 1443 98 %     Weight --      Height --      Head Circumference --      Peak Flow --      Pain Score 12/20/19 1440 4     Pain Loc --      Pain Edu? --      Excl. in GC? --    No data found.  Updated Vital Signs BP  132/87 (BP Location: Right Arm)   Pulse 87   Temp 97.7 F (36.5 C) (Oral)   Resp (!) 23   SpO2 98%       Physical Exam Constitutional:      General: She is not in acute distress.    Appearance: She is well-developed.     Comments: Pleasant.  Overweight  HENT:     Head: Normocephalic and atraumatic.     Right Ear: Tympanic membrane, ear canal and external ear normal.     Left Ear: Tympanic membrane, ear canal and external ear normal.     Nose: Congestion present.     Comments: Sinus tenderness in maxillary areas.  Nasal membranes are swollen and red    Mouth/Throat:     Mouth: Mucous membranes are moist.     Pharynx: No posterior oropharyngeal erythema.  Eyes:     Conjunctiva/sclera: Conjunctivae normal.     Pupils: Pupils are equal, round, and reactive to light.  Cardiovascular:     Rate and Rhythm: Normal rate and regular rhythm.     Heart sounds: Normal heart sounds.  Pulmonary:     Effort: Pulmonary effort is normal. No respiratory distress.     Breath sounds: Normal breath sounds. No stridor. No wheezing or rales.  Abdominal:     Palpations: Abdomen is soft.  Musculoskeletal:        General: Normal range of motion.     Cervical back: Normal range of motion.  Lymphadenopathy:     Cervical: No cervical adenopathy.  Skin:    General: Skin is warm and dry.  Neurological:     Mental Status: She is alert.  Psychiatric:        Mood and Affect: Mood normal.        Behavior: Behavior normal.      UC Treatments / Results  Labs (all labs ordered are listed, but only abnormal results are displayed) Labs Reviewed - No data to display  EKG   Radiology No results found.  Procedures Procedures (including critical care  time)  Medications Ordered in UC Medications - No data to display  Initial Impression / Assessment and Plan / UC Course  I have reviewed the triage vital signs and the nursing notes.  Pertinent labs & imaging results that were available during my care of the patient were reviewed by me and considered in my medical decision making (see chart for details).      Final Clinical Impressions(s) / UC Diagnoses   Final diagnoses:  Acute non-recurrent maxillary sinusitis     Discharge Instructions     Take the doxycycline antibiotic 2 x a day You have taken this successfully in the past Use the flonase as directed Continue to drink plenty of water   ED Prescriptions    Medication Sig Dispense Auth. Provider   fluticasone (FLONASE) 50 MCG/ACT nasal spray Place 2 sprays into both nostrils daily. 16 g Eustace Moore, MD   doxycycline (VIBRAMYCIN) 100 MG capsule Take 1 capsule (100 mg total) by mouth 2 (two) times daily. 20 capsule Eustace Moore, MD     PDMP not reviewed this encounter.   Eustace Moore, MD 12/20/19 [phone]

## 2019-12-20 NOTE — ED Triage Notes (Addendum)
Pt presents with headache and nasal congestion, fever, body aches x 3 weeks.

## 2019-12-25 ENCOUNTER — Other Ambulatory Visit: Payer: Self-pay | Admitting: Family Medicine

## 2019-12-25 DIAGNOSIS — I10 Essential (primary) hypertension: Secondary | ICD-10-CM

## 2019-12-26 ENCOUNTER — Other Ambulatory Visit: Payer: Self-pay | Admitting: Family Medicine

## 2019-12-26 DIAGNOSIS — K219 Gastro-esophageal reflux disease without esophagitis: Secondary | ICD-10-CM

## 2019-12-27 ENCOUNTER — Encounter: Payer: Medicare Other | Admitting: Family Medicine

## 2019-12-31 ENCOUNTER — Ambulatory Visit: Payer: Medicare Other | Admitting: Family Medicine

## 2020-01-28 ENCOUNTER — Ambulatory Visit: Payer: Medicare Other | Admitting: Family Medicine

## 2020-02-17 ENCOUNTER — Encounter: Payer: Self-pay | Admitting: Internal Medicine

## 2020-02-17 ENCOUNTER — Ambulatory Visit: Payer: Medicare Other | Admitting: Critical Care Medicine

## 2020-02-17 ENCOUNTER — Ambulatory Visit: Payer: Medicare Other | Admitting: Family Medicine

## 2020-02-17 ENCOUNTER — Other Ambulatory Visit: Payer: Self-pay

## 2020-02-17 ENCOUNTER — Ambulatory Visit: Payer: Medicare Other | Attending: Family Medicine | Admitting: Internal Medicine

## 2020-02-17 VITALS — BP 135/78 | HR 85 | Temp 98.5°F | Resp 16 | Wt 219.0 lb

## 2020-02-17 DIAGNOSIS — I10 Essential (primary) hypertension: Secondary | ICD-10-CM

## 2020-02-17 DIAGNOSIS — Z0000 Encounter for general adult medical examination without abnormal findings: Secondary | ICD-10-CM

## 2020-02-17 DIAGNOSIS — R718 Other abnormality of red blood cells: Secondary | ICD-10-CM | POA: Diagnosis not present

## 2020-02-17 NOTE — Addendum Note (Signed)
Addended by: Guy Franco on: 02/17/2020 10:16 AM   Modules accepted: Orders

## 2020-02-17 NOTE — Progress Notes (Signed)
Requesting refill for: Hydroxyzine- she would like for itching Prochlorperazine- she occasionaly uses  Medicare wellness- she feels well no complaints  Reviewed immunizations and health maintenance issues  She lives with family members  nonsmokder -quit 1987 Alcohol- 2 drinks/day  Past Medical History:  Diagnosis Date  . Allergy   . Anemia   . Anxiety   . Arthritis   . Asthma    borderline bronchitis  . Bronchitis   . GERD (gastroesophageal reflux disease)   . Hypertension   . Sleep apnea    history of, MD tookpt. off machine 7 yrs, ago    Social History   Socioeconomic History  . Marital status: Single    Spouse name: Not on file  . Number of children: Not on file  . Years of education: Not on file  . Highest education level: Not on file  Occupational History  . Not on file  Tobacco Use  . Smoking status: Former Smoker    Packs/day: 1.50    Years: 18.00    Pack years: 27.00    Types: Cigarettes    Quit date: 09/16/1984    Years since quitting: 35.4  . Smokeless tobacco: Never Used  . Tobacco comment: quit 1986  Vaping Use  . Vaping Use: Never used  Substance and Sexual Activity  . Alcohol use: Yes    Comment: social  . Drug use: No  . Sexual activity: Not on file  Other Topics Concern  . Not on file  Social History Narrative  . Not on file   Social Determinants of Health   Financial Resource Strain: Not on file  Food Insecurity: Not on file  Transportation Needs: Not on file  Physical Activity: Not on file  Stress: Not on file  Social Connections: Not on file  Intimate Partner Violence: Not on file    Past Surgical History:  Procedure Laterality Date  . ABDOMINAL HYSTERECTOMY    . adnoides    . BREAST SURGERY    . CHOLECYSTECTOMY    . COLONOSCOPY    . DILATION AND CURETTAGE OF UTERUS    . GASTRIC BYPASS  1998  . IUD REMOVAL    . TONSILLECTOMY    . TOTAL KNEE ARTHROPLASTY Left 09/22/2018  . TOTAL KNEE ARTHROPLASTY Left 09/22/2018    Procedure: LEFT TOTAL KNEE ARTHROPLASTY;  Surgeon: Cammy Copa, MD;  Location: [facility] OR;  Service: Orthopedics;  Laterality: Left;    Family History  Problem Relation Age of Onset  . Hypertension Father   . Diabetes Father   . Lupus Daughter   . Colon cancer Maternal Aunt   . Colon cancer Maternal Uncle        4 mat uncles dx colon ca  . Esophageal cancer Neg Hx   . Rectal cancer Neg Hx   . Stomach cancer Neg Hx     Allergies  Allergen Reactions  . Morphine And Related Swelling  . Penicillins Swelling  . Pine Shortness Of Breath  . Shellfish Allergy Shortness Of Breath  . Miralax [Polyethylene Glycol] Itching  . Other Itching and Nausea And Vomiting    Mushrooms - feels high, and dizzy   . Wheat Bran     Rash, tingling in mouth, vomiting  . Grapeseed Extract [Nutritional Supplements] Rash    grapes  . Latex Itching and Rash    "IF" condom, it makes it painful  . Red Dye Swelling and Rash    The dye for checking thyroid.  Red  Cast Dye  . Sulfites Rash    Tingling in mouth    Current Outpatient Medications on File Prior to Visit  Medication Sig Dispense Refill  . acetaminophen (TYLENOL) 325 MG tablet Take 1 tablet (325 mg total) by mouth every 8 (eight) hours. 100 tablet 2  . albuterol (PROAIR HFA) 108 (90 Base) MCG/ACT inhaler Inhale 2 puffs into the lungs every 6 (six) hours as needed for wheezing or shortness of breath. 8.5 g 2  . famotidine (PEPCID) 20 MG tablet TAKE 1 TABLET BY MOUTH  TWICE DAILY 180 tablet 1  . gabapentin (NEURONTIN) 300 MG capsule Take 1 capsule (300 mg total) by mouth 3 (three) times daily. 270 capsule 3  . hydrochlorothiazide (HYDRODIURIL) 25 MG tablet TAKE 1 TABLET BY MOUTH  DAILY 90 tablet 0  . ibuprofen (ADVIL) 600 MG tablet Take 600 mg by mouth every 6 (six) hours as needed.    . montelukast (SINGULAIR) 10 MG tablet Take 10 mg by mouth daily as needed (wheezing).    . ferrous sulfate 325 (65 FE) MG tablet Take 1 tablet (325 mg total) by  mouth daily with breakfast. (Patient taking differently: Take 325 mg by mouth daily with breakfast. Last took 10-13-20202) 90 tablet 1  . FLOVENT HFA 110 MCG/ACT inhaler Inhale 2 puffs into the lungs 2 (two) times daily as needed for up to 30 days. (Patient not taking: Reported on 05/13/2019) 1 Inhaler 3  . Misc. Devices MISC Lazy boy chair. Dx- right rotator cuff arthropathy 1 each 0  . Multiple Vitamin (MULTIVITAMIN) tablet Take 1 tablet by mouth daily.     No current facility-administered medications on file prior to visit.     patient denies chest pain, shortness of breath, orthopnea. Denies lower extremity edema, abdominal pain, change in appetite, change in bowel movements. Patient denies rashes, musculoskeletal complaints. No other specific complaints in a complete review of systems.   BP (!) 150/87   Pulse 85   Temp 98.5 F (36.9 C) (Oral)   Resp 16   Wt 219 lb (99.3 kg)   SpO2 100%   BMI 38.79 kg/m   Well-developed well-nourished female in no acute distress. HEENT exam atraumatic, normocephalic, extraocular muscles are intact. Neck is supple. No jugular venous distention no thyromegaly. Chest clear to auscultation without increased work of breathing. Cardiac exam S1 and S2 are regular. Abdominal exam active bowel sounds, soft, nontender. Extremities no edema. Neurologic exam she is alert without any motor sensory deficits. Gait is normal.   Medicare wellness exam. Health maintenance is UTD. She refuses pneumovax and flu immunization.   I will get labs- cbc, bmet (low mcv, and on diuretic).  meds- I will avoid aterax and prochlorperazine for now. I think the less medications she has, the better

## 2020-02-18 LAB — BMP8+EGFR
BUN/Creatinine Ratio: 26 (ref 12–28)
BUN: 22 mg/dL (ref 8–27)
CO2: 22 mmol/L (ref 20–29)
Calcium: 9 mg/dL (ref 8.7–10.3)
Chloride: 101 mmol/L (ref 96–106)
Creatinine, Ser: 0.84 mg/dL (ref 0.57–1.00)
GFR calc Af Amer: 84 mL/min/{1.73_m2} (ref >59)
GFR calc non Af Amer: 73 mL/min/{1.73_m2} (ref >59)
Glucose: 77 mg/dL (ref 65–99)
Potassium: 3.8 mmol/L (ref 3.5–5.2)
Sodium: 140 mmol/L (ref 134–144)

## 2020-02-18 LAB — CBC WITH DIFFERENTIAL/PLATELET
Basophils Absolute: 0.1 10*3/uL (ref 0.0–0.2)
Basos: 2 %
EOS (ABSOLUTE): 0 10*3/uL (ref 0.0–0.4)
Eos: 1 %
Hematocrit: 38.5 % (ref 34.0–46.6)
Hemoglobin: 12 g/dL (ref 11.1–15.9)
Immature Grans (Abs): 0 10*3/uL (ref 0.0–0.1)
Immature Granulocytes: 0 %
Lymphocytes Absolute: 1.3 10*3/uL (ref 0.7–3.1)
Lymphs: 42 %
MCH: 24.3 pg — ABNORMAL LOW (ref 26.6–33.0)
MCHC: 31.2 g/dL — ABNORMAL LOW (ref 31.5–35.7)
MCV: 78 fL — ABNORMAL LOW (ref 79–97)
Monocytes Absolute: 0.3 10*3/uL (ref 0.1–0.9)
Monocytes: 10 %
Neutrophils Absolute: 1.4 10*3/uL (ref 1.4–7.0)
Neutrophils: 45 %
Platelets: 238 10*3/uL (ref 150–450)
RBC: 4.94 x10E6/uL (ref 3.77–5.28)
RDW: 14.9 % (ref 11.7–15.4)
WBC: 3.2 10*3/uL — ABNORMAL LOW (ref 3.4–10.8)

## 2020-02-18 LAB — FERRITIN: Ferritin: 246 ng/mL — ABNORMAL HIGH (ref 15–150)

## 2020-02-23 ENCOUNTER — Ambulatory Visit: Payer: Medicare Other | Admitting: Critical Care Medicine

## 2020-02-26 ENCOUNTER — Other Ambulatory Visit: Payer: Self-pay | Admitting: Family Medicine

## 2020-02-26 DIAGNOSIS — Z760 Encounter for issue of repeat prescription: Secondary | ICD-10-CM

## 2020-02-26 DIAGNOSIS — R112 Nausea with vomiting, unspecified: Secondary | ICD-10-CM

## 2020-02-28 NOTE — Telephone Encounter (Signed)
   Notes to clinic:  medication not on current list Review for use and refill   Requested Prescriptions  Pending Prescriptions Disp Refills   prochlorperazine (COMPAZINE) 5 MG tablet [Pharmacy Med Name: PROCHLORPERAZINE  5MG   TAB] 90 tablet 0    Sig: TAKE 1 TABLET BY MOUTH  EVERY 6 HOURS AS NEEDED FOR NAUSEA OR VOMITING      Not Delegated - Gastroenterology: Antiemetics Failed - 02/26/2020 10:15 AM      Failed - This refill cannot be delegated      Passed - Valid encounter within last 6 months    Recent Outpatient Visits           1 week ago Encounter for 04/25/2020 annual wellness exam   [facility] [place], [employer], MD   8 months ago Rotator cuff arthropathy of right shoulder   [facility] [place], [place], MD   9 months ago Essential hypertension   [facility] [place], [place] M, M   11 months ago Essential hypertension   [facility] [place], [place], RPH-CPP   1 year ago Essential hypertension   [facility] [place], [place], MD

## 2020-03-01 MED ORDER — HYDROXYZINE HCL 10 MG PO TABS: 10.0000 mg | ORAL_TABLET | Freq: Every day | ORAL | 1 refills | Status: DC | PRN

## 2020-05-02 ENCOUNTER — Other Ambulatory Visit: Payer: Self-pay | Admitting: Family Medicine

## 2020-05-02 DIAGNOSIS — R112 Nausea with vomiting, unspecified: Secondary | ICD-10-CM

## 2020-05-02 NOTE — Telephone Encounter (Signed)
Requested medications are due for refill today yes (Mail order, [place])  Requested medications are on the active medication list yes  Last refill 03/01/20  Last visit 02/17/20  Future visit scheduled no  Notes to clinic OV note states return in 6 months, no appt made, 360 tabs given, OV note states "uses occasionally. Not Delegated

## 2020-06-05 ENCOUNTER — Telehealth: Payer: Self-pay

## 2020-06-05 IMAGING — DX DG LUMBAR SPINE COMPLETE 4+V
5 series · 5 of 5 positions shown · non-contrast
Comparison: None.

CLINICAL DATA: Chronic low back pain and right-sided sciatica. No
recent injury.

EXAM:
LUMBAR SPINE - COMPLETE 4+ VIEW

[t lumbar spine ap]
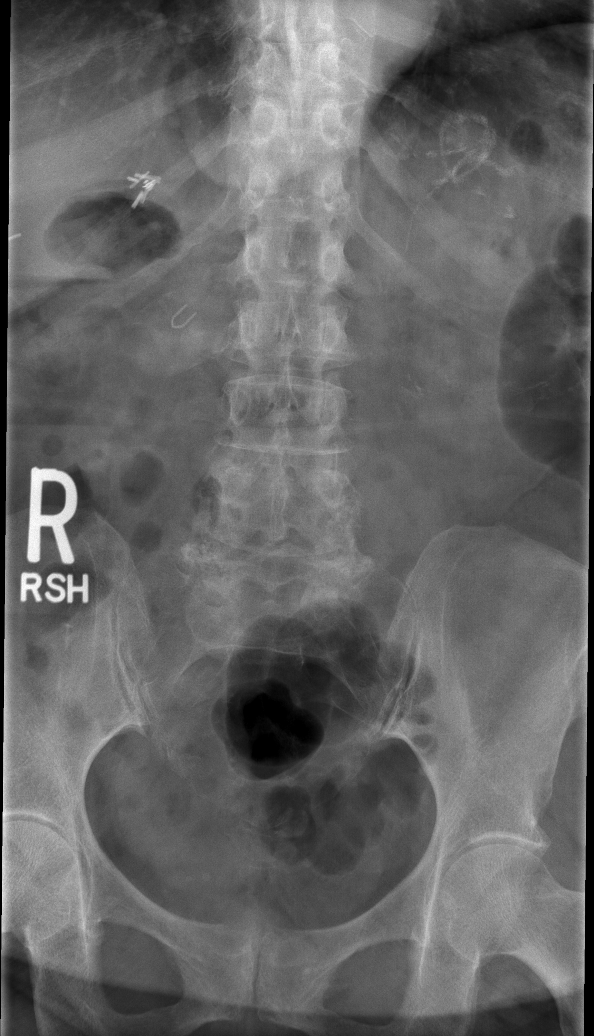

[t lumbar spine obl (1 of 2)]
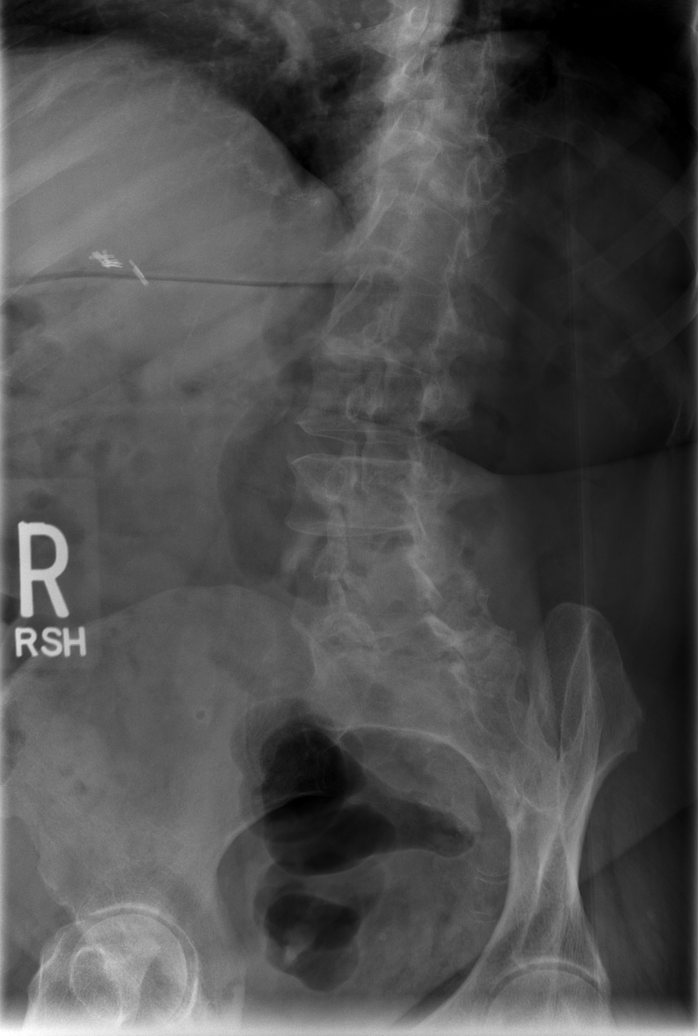

[t lumbar spine obl (2 of 2)]
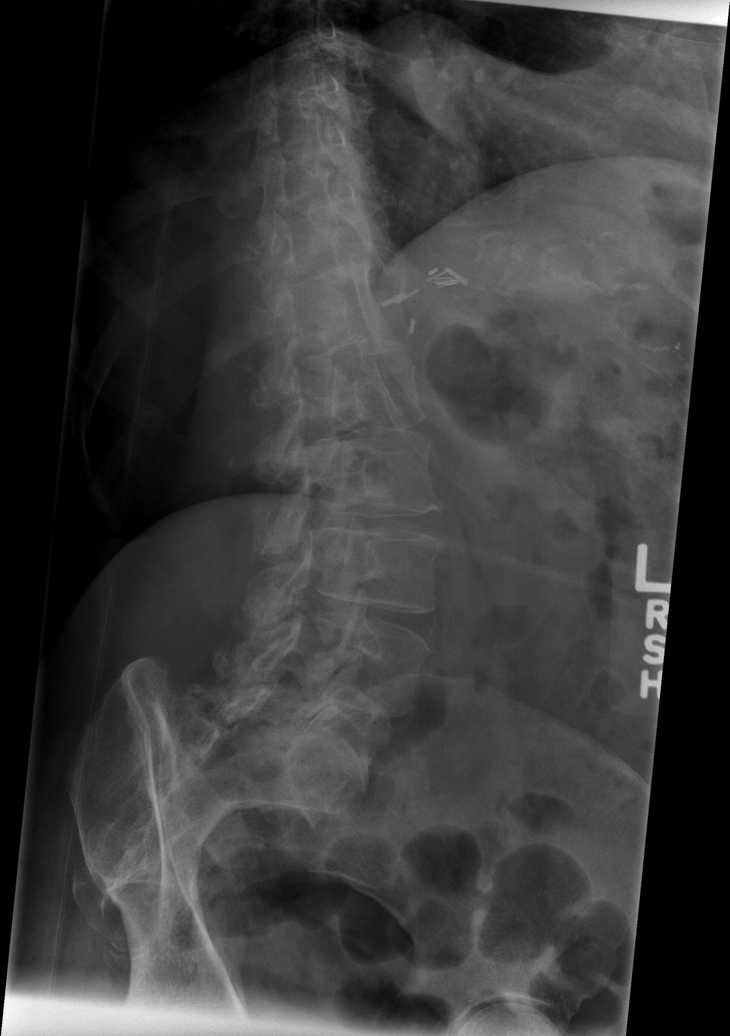

[t lumbar spine lat]
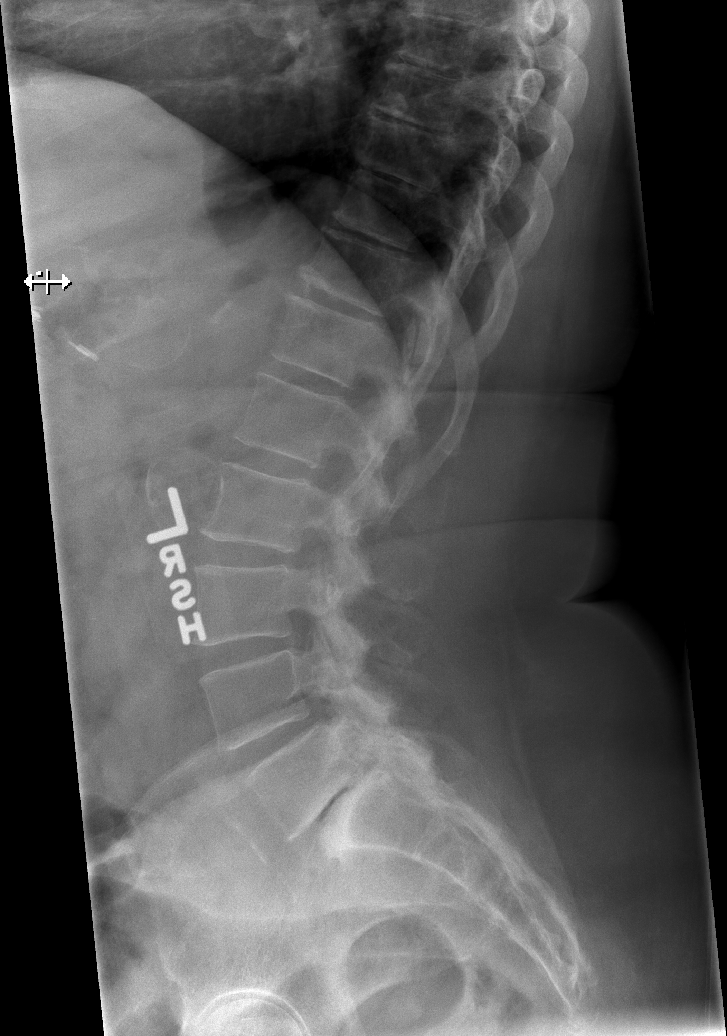

[t lumbar l-5 s-1 spot]
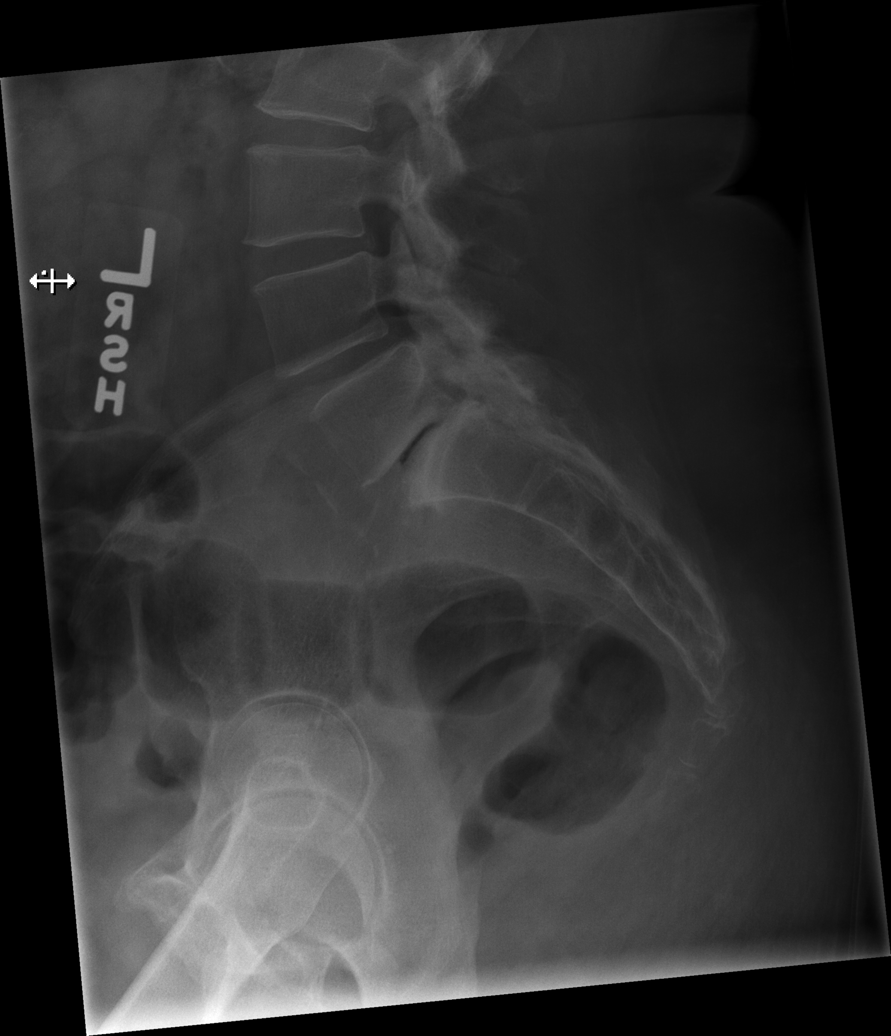

[5 of 5 positions shown; findings below may reference images not displayed]

FINDINGS: Vertebral body height is maintained. Facet degenerative disease
results in 0.4 cm anterolisthesis L4 on L5. There is marked loss of
disc space height at L4-5 with vacuum disc phenomenon. Paraspinous
structures demonstrate no acute or focal abnormality. Suture
material and surgical clips in the upper abdomen noted.
IMPRESSION: No acute abnormality.

Advanced appearing degenerative disease L5-S1.

## 2020-06-05 NOTE — Telephone Encounter (Signed)
Called Pt no answer and vm full. Dr. Cato Mulligan can not be Pt PCP he is only a Psychologist, sport and exercise. Also appt with Marylene Land on 5/25 is not to establish care because Marylene Land is the PA & on works on Wednesday and Thursday. Pt will need an appt with Dr. Laural Benes, Delman Cheadle, or Plummer to reestablish care. Original PCP was Fulp.

## 2020-06-13 ENCOUNTER — Other Ambulatory Visit: Payer: Self-pay | Admitting: Pharmacist

## 2020-06-13 DIAGNOSIS — I10 Essential (primary) hypertension: Secondary | ICD-10-CM

## 2020-06-13 MED ORDER — HYDROCHLOROTHIAZIDE 25 MG PO TABS: 1.0000 | ORAL_TABLET | Freq: Every day | ORAL | 0 refills | Status: DC

## 2020-07-19 ENCOUNTER — Ambulatory Visit: Payer: Medicare Other | Admitting: Physician Assistant

## 2020-07-26 ENCOUNTER — Ambulatory Visit: Payer: Medicare Other | Attending: Physician Assistant | Admitting: Critical Care Medicine

## 2020-07-26 ENCOUNTER — Encounter: Payer: Self-pay | Admitting: Critical Care Medicine

## 2020-07-26 ENCOUNTER — Other Ambulatory Visit: Payer: Self-pay

## 2020-07-26 VITALS — BP 133/85 | HR 62 | Resp 16 | Wt 209.0 lb

## 2020-07-26 DIAGNOSIS — Z9189 Other specified personal risk factors, not elsewhere classified: Secondary | ICD-10-CM | POA: Diagnosis not present

## 2020-07-26 DIAGNOSIS — J40 Bronchitis, not specified as acute or chronic: Secondary | ICD-10-CM | POA: Insufficient documentation

## 2020-07-26 DIAGNOSIS — M25511 Pain in right shoulder: Secondary | ICD-10-CM | POA: Diagnosis not present

## 2020-07-26 DIAGNOSIS — R0602 Shortness of breath: Secondary | ICD-10-CM | POA: Diagnosis not present

## 2020-07-26 DIAGNOSIS — J301 Allergic rhinitis due to pollen: Secondary | ICD-10-CM

## 2020-07-26 DIAGNOSIS — F319 Bipolar disorder, unspecified: Secondary | ICD-10-CM | POA: Insufficient documentation

## 2020-07-26 DIAGNOSIS — R112 Nausea with vomiting, unspecified: Secondary | ICD-10-CM

## 2020-07-26 DIAGNOSIS — K219 Gastro-esophageal reflux disease without esophagitis: Secondary | ICD-10-CM | POA: Insufficient documentation

## 2020-07-26 DIAGNOSIS — Z760 Encounter for issue of repeat prescription: Secondary | ICD-10-CM

## 2020-07-26 DIAGNOSIS — I10 Essential (primary) hypertension: Secondary | ICD-10-CM | POA: Diagnosis not present

## 2020-07-26 DIAGNOSIS — G8929 Other chronic pain: Secondary | ICD-10-CM

## 2020-07-26 DIAGNOSIS — T7840XA Allergy, unspecified, initial encounter: Secondary | ICD-10-CM

## 2020-07-26 DIAGNOSIS — M25551 Pain in right hip: Secondary | ICD-10-CM | POA: Insufficient documentation

## 2020-07-26 DIAGNOSIS — Z1231 Encounter for screening mammogram for malignant neoplasm of breast: Secondary | ICD-10-CM

## 2020-07-26 HISTORY — DX: Bipolar disorder, unspecified: F31.9

## 2020-07-26 MED ORDER — IBUPROFEN 600 MG PO TABS: 600.0000 mg | ORAL_TABLET | Freq: Four times a day (QID) | ORAL | 1 refills | Status: DC | PRN

## 2020-07-26 MED ORDER — PROCHLORPERAZINE MALEATE 5 MG PO TABS: 5.0000 mg | ORAL_TABLET | Freq: Four times a day (QID) | ORAL | 0 refills | Status: DC | PRN

## 2020-07-26 MED ORDER — MONTELUKAST SODIUM 10 MG PO TABS: 10.0000 mg | ORAL_TABLET | Freq: Every day | ORAL | 3 refills | Status: DC | PRN

## 2020-07-26 MED ORDER — FLOVENT HFA 110 MCG/ACT IN AERO: 2.0000 | INHALATION_SPRAY | Freq: Two times a day (BID) | RESPIRATORY_TRACT | 3 refills | Status: DC | PRN

## 2020-07-26 MED ORDER — HYDROCHLOROTHIAZIDE 25 MG PO TABS: 1.0000 | ORAL_TABLET | Freq: Every day | ORAL | 3 refills | Status: DC

## 2020-07-26 MED ORDER — FAMOTIDINE 20 MG PO TABS: 20.0000 mg | ORAL_TABLET | Freq: Two times a day (BID) | ORAL | 1 refills | Status: DC

## 2020-07-26 MED ORDER — GABAPENTIN 300 MG PO CAPS: 300.0000 mg | ORAL_CAPSULE | Freq: Two times a day (BID) | ORAL | 1 refills | Status: DC

## 2020-07-26 MED ORDER — HYDROXYZINE HCL 10 MG PO TABS: 10.0000 mg | ORAL_TABLET | Freq: Every day | ORAL | 1 refills | Status: DC | PRN

## 2020-07-26 MED ORDER — ALBUTEROL SULFATE HFA 108 (90 BASE) MCG/ACT IN AERS: 2.0000 | INHALATION_SPRAY | Freq: Four times a day (QID) | RESPIRATORY_TRACT | 2 refills | Status: DC | PRN

## 2020-07-26 NOTE — Patient Instructions (Signed)
A bone density study will be obtained  Mammogram will be ordered  Referral to orthopedics we made for your right shoulder  Obtain an x-ray at [facility] you do not need an appointment of your right hip and pelvis  Refills on all your medications sent to your [facility] mail order  Return to see Dr. Delford Field 4 months

## 2020-07-26 NOTE — Assessment & Plan Note (Signed)
Continue Pepcid 2-3 times daily and Compazine as need for symptoms. Follow up at next appointment

## 2020-07-26 NOTE — Assessment & Plan Note (Signed)
Patient states she has chronic bronchitis rather than asthma. Continue Flovent twice daily while experiencing symptoms. Continue albuterol as needed.  Follow up at next appointment

## 2020-07-26 NOTE — Progress Notes (Signed)
New Patient Office Visit  Subjective:  Patient ID: Cheryl Prince, female    DOB: [birth year]  Age: 65 y.o. MRN: [id number]  CC:  Chief Complaint  Patient presents with  . Hypertension    HPI Cheryl Prince presents for PCP to f/u    Former [facility] pt. The patient has been experiencing frequent chronic right shoulder and right hip pain for a few months. The right shoulder pain occurs throughout the shoulder area and joint. The pain is worse with movement and improves with keeping the right shoulder still. The patient takes Tylenol and gabapentin for the pain which is helpful. The patient has seen orthopedics in the past for this pain and was told she may need a right shoulder replacement in the future. She also states she saw physical therapy in the past for her shoulder which was helpful. The patient's right hip pain occurs mostly in the anterior and lateral aspect of the right hip. The pain improves with taking Tylenol and gabapentin. The pain is exacerbated by walking on the treadmill. She has seen physical therapy in the past for the right hip pain which was helpful. She would like a referral for physical therapy today.  The patient has a history of hypertension which is well controlled on her current dose of HCTZ.  The patient has chronic bronchitis for which she takes Flovent while experiencing symptoms and albuterol as needed.  The patient has multiple seasonal and food allergies which cause itching. The patient takes hydroxyzine and Singulair for these symptoms.  The patient has daily gastroesophageal reflux symptoms for which she takes Pepcid and Compazine.   The patient's medical record reveals a diagnosis of bipolar 1 disorder, though the patient denies having this diagnosis. She admits to having PTSD from [employer].    Past Medical History:  Diagnosis Date  . Allergy   . Anemia   . Anxiety   . Arthritis   . Asthma    borderline bronchitis  .  Bronchitis   . GERD (gastroesophageal reflux disease)   . Hypertension   . S/P tonsillectomy 05/30/2016  . Sleep apnea    history of, MD tookpt. off machine 7 yrs, ago      Family History  Problem Relation Age of Onset  . Hypertension Father   . Diabetes Father   . Lupus Daughter   . Colon cancer Maternal Aunt   . Colon cancer Maternal Uncle        4 mat uncles dx colon ca  . Esophageal cancer Neg Hx   . Rectal cancer Neg Hx   . Stomach cancer Neg Hx     Social History   Socioeconomic History  . Marital status: Single    Spouse name: Not on file  . Number of children: Not on file  . Years of education: Not on file  . Highest education level: Not on file  Occupational History  . Not on file  Tobacco Use  . Smoking status: Former Smoker    Packs/day: 1.50    Years: 18.00    Pack years: 27.00    Types: Cigarettes    Quit date: 09/16/1984    Years since quitting: 35.8  . Smokeless tobacco: Never Used  . Tobacco comment: quit 1986  Vaping Use  . Vaping Use: Never used  Substance and Sexual Activity  . Alcohol use: Yes    Comment: social  . Drug use: No  . Sexual activity: Not on file  Other  Topics Concern  . Not on file  Social History Narrative  . Not on file   Social Determinants of Health   Financial Resource Strain: Not on file  Food Insecurity: Not on file  Transportation Needs: Not on file  Physical Activity: Not on file  Stress: Not on file  Social Connections: Not on file  Intimate Partner Violence: Not on file    ROS Review of Systems  Constitutional: Negative for fever.  HENT: Negative.   Eyes: Negative.   Respiratory: Negative for cough.   Cardiovascular: Negative for chest pain.  Gastrointestinal: Positive for nausea (Occasional, associated with GERD symptoms). Negative for diarrhea and vomiting.  Musculoskeletal:       Right hip pain Right shoulder pain   Skin: Negative.   Neurological: Negative for numbness and headaches.     Objective:   Today's Vitals: BP 133/85   Pulse 62   Resp 16   Wt 209 lb (94.8 kg)   SpO2 98%   BMI 37.02 kg/m   Physical Exam Constitutional:      General: She is not in acute distress. HENT:     Head: Normocephalic and atraumatic.     Right Ear: External ear normal.     Left Ear: External ear normal.     Mouth/Throat:     Mouth: Mucous membranes are dry.  Eyes:     Conjunctiva/sclera: Conjunctivae normal.  Cardiovascular:     Rate and Rhythm: Normal rate and regular rhythm.     Heart sounds: Normal heart sounds. No murmur heard. No friction rub. No gallop.   Pulmonary:     Effort: Pulmonary effort is normal. No respiratory distress.     Breath sounds: Normal breath sounds.  Abdominal:     General: Abdomen is flat. There is no distension.     Tenderness: There is no abdominal tenderness.  Musculoskeletal:        General: Tenderness (Tenderness to palpation of the right shoulder including along the acromioclavicular joint. ) present. Normal range of motion.     Cervical back: Normal range of motion.  Skin:    General: Skin is warm and dry.  Neurological:     Mental Status: She is alert and oriented to person, place, and time.  Psychiatric:        Mood and Affect: Mood normal.        Behavior: Behavior normal.     Assessment & Plan:   Problem List Items Addressed This Visit      Cardiovascular and Mediastinum   Hypertension    Continue HCTZ as prescribed. Follow up in two months       Relevant Medications   hydrochlorothiazide (HYDRODIURIL) 25 MG tablet     Respiratory   Bronchitis    Patient states she has chronic bronchitis rather than asthma. Continue Flovent twice daily while experiencing symptoms. Continue albuterol as needed.  Follow up at next appointment         Digestive   GERD (gastroesophageal reflux disease)    Continue Pepcid 2-3 times daily and Compazine as need for symptoms. Follow up at next appointment       Relevant  Medications   famotidine (PEPCID) 20 MG tablet     Other   Bipolar I disorder (HCC) - Primary   Right hip pain    Discussed will refer to physical therapy. Right hip xray ordered.  Follow up as need      Relevant Orders   DG HIP UNILAT  WITH PELVIS 2-3 VIEWS RIGHT   Right shoulder pain    Referred back to orthopedics.  Follow up as needed.        Relevant Orders   Ambulatory referral to Orthopedic Surgery   Allergies    Continue hydroxyzine and Singulair as needed  Follow up at next visit        Other Visit Diagnoses    Shortness of breath       Relevant Medications   FLOVENT HFA 110 MCG/ACT inhaler   Medication refill       Relevant Medications   gabapentin (NEURONTIN) 300 MG capsule   hydrOXYzine (ATARAX/VISTARIL) 10 MG tablet   Essential hypertension       Relevant Medications   hydrochlorothiazide (HYDRODIURIL) 25 MG tablet   Nausea and vomiting, intractability of vomiting not specified, unspecified vomiting type       Relevant Medications   prochlorperazine (COMPAZINE) 5 MG tablet   Encounter for screening mammogram for malignant neoplasm of breast       Relevant Orders   MM DIGITAL SCREENING BILATERAL   At risk for decreased bone density       Relevant Orders   DG Bone Density      Outpatient Encounter Medications as of 07/26/2020  Medication Sig  . Omega-3 1000 MG CAPS Take 1 capsule by mouth daily before breakfast.  . acetaminophen (TYLENOL) 325 MG tablet Take 1 tablet (325 mg total) by mouth every 8 (eight) hours.  [name] albuterol (PROAIR HFA) 108 (90 Base) MCG/ACT inhaler Inhale 2 puffs into the lungs every 6 (six) hours as needed for wheezing or shortness of breath.  . famotidine (PEPCID) 20 MG tablet Take 1 tablet (20 mg total) by mouth 2 (two) times daily.  [name] FLOVENT HFA 110 MCG/ACT inhaler Inhale 2 puffs into the lungs 2 (two) times daily as needed.  . gabapentin (NEURONTIN) 300 MG capsule Take 1 capsule (300 mg total) by mouth 2 (two) times daily.  .  hydrochlorothiazide (HYDRODIURIL) 25 MG tablet Take 1 tablet (25 mg total) by mouth daily.  . hydrOXYzine (ATARAX/VISTARIL) 10 MG tablet Take 1 tablet (10 mg total) by mouth daily as needed for itching or anxiety.  [name] ibuprofen (ADVIL) 600 MG tablet Take 1 tablet (600 mg total) by mouth every 6 (six) hours as needed for moderate pain.  . Misc. Devices MISC Lazy boy chair. Dx- right rotator cuff arthropathy  . montelukast (SINGULAIR) 10 MG tablet Take 1 tablet (10 mg total) by mouth daily as needed (wheezing).  . Multiple Vitamin (MULTIVITAMIN) tablet Take 1 tablet by mouth daily.  . prochlorperazine (COMPAZINE) 5 MG tablet Take 1 tablet (5 mg total) by mouth every 6 (six) hours as needed.  . [DISCONTINUED] albuterol (PROAIR HFA) 108 (90 Base) MCG/ACT inhaler Inhale 2 puffs into the lungs every 6 (six) hours as needed for wheezing or shortness of breath.  . [DISCONTINUED] famotidine (PEPCID) 20 MG tablet TAKE 1 TABLET BY MOUTH  TWICE DAILY  . [DISCONTINUED] ferrous sulfate 325 (65 FE) MG tablet Take 1 tablet (325 mg total) by mouth daily with breakfast. (Patient taking differently: Take 325 mg by mouth every other day. Last took 02/17/2020)  . [DISCONTINUED] FLOVENT HFA 110 MCG/ACT inhaler Inhale 2 puffs into the lungs 2 (two) times daily as needed for up to 30 days. (Patient not taking: No sig reported)  . [DISCONTINUED] gabapentin (NEURONTIN) 300 MG capsule Take 1 capsule (300 mg total) by mouth 3 (three) times daily. (Patient taking differently:  Take 300 mg by mouth 2 (two) times daily.)  . [DISCONTINUED] hydrochlorothiazide (HYDRODIURIL) 25 MG tablet Take 1 tablet (25 mg total) by mouth daily.  . [DISCONTINUED] hydrOXYzine (ATARAX/VISTARIL) 10 MG tablet Take 1 tablet (10 mg total) by mouth daily as needed for itching or anxiety.  . [DISCONTINUED] ibuprofen (ADVIL) 600 MG tablet Take 600 mg by mouth every 6 (six) hours as needed.  . [DISCONTINUED] montelukast (SINGULAIR) 10 MG tablet Take 10 mg by  mouth daily as needed (wheezing).  . [DISCONTINUED] prochlorperazine (COMPAZINE) 5 MG tablet TAKE 1 TABLET BY MOUTH  EVERY 6 HOURS AS NEEDED FOR NAUSEA OR VOMITING   No facility-administered encounter medications on file as of 07/26/2020.    Follow-up: Return in about 4 months (around 11/25/2020).   Shan Levans, MD   I have seen and examined this patient with the mid-level provider and agree with the above note . Shan Levans

## 2020-07-26 NOTE — Assessment & Plan Note (Signed)
Continue HCTZ as prescribed. Follow up in two months

## 2020-07-26 NOTE — Assessment & Plan Note (Addendum)
Discussed will refer to physical therapy. Right hip xray ordered.  Follow up as need

## 2020-07-26 NOTE — Assessment & Plan Note (Signed)
Referred back to orthopedics.  Follow up as needed.

## 2020-07-26 NOTE — Assessment & Plan Note (Addendum)
Continue hydroxyzine and Singulair as needed  Follow up at next visit

## 2020-07-28 ENCOUNTER — Other Ambulatory Visit: Payer: Self-pay

## 2020-07-28 ENCOUNTER — Ambulatory Visit (HOSPITAL_COMMUNITY)
Admission: RE | Admit: 2020-07-28 | Discharge: 2020-07-28 | Disposition: A | Discharge status: Home / Self Care | Payer: Medicare Other | Source: Ambulatory Visit | Attending: Critical Care Medicine | Admitting: Critical Care Medicine

## 2020-07-28 DIAGNOSIS — M25551 Pain in right hip: Secondary | ICD-10-CM | POA: Insufficient documentation

## 2020-08-02 ENCOUNTER — Encounter: Payer: Self-pay | Admitting: Orthopedic Surgery

## 2020-08-02 ENCOUNTER — Ambulatory Visit (INDEPENDENT_AMBULATORY_CARE_PROVIDER_SITE_OTHER): Payer: Medicare Other | Admitting: Orthopedic Surgery

## 2020-08-02 ENCOUNTER — Other Ambulatory Visit: Payer: Self-pay

## 2020-08-02 ENCOUNTER — Ambulatory Visit: Payer: Self-pay

## 2020-08-02 DIAGNOSIS — M7061 Trochanteric bursitis, right hip: Secondary | ICD-10-CM

## 2020-08-02 DIAGNOSIS — M25511 Pain in right shoulder: Secondary | ICD-10-CM | POA: Diagnosis not present

## 2020-08-02 DIAGNOSIS — M12811 Other specific arthropathies, not elsewhere classified, right shoulder: Secondary | ICD-10-CM | POA: Diagnosis not present

## 2020-08-02 NOTE — Progress Notes (Signed)
Office Visit Note   Patient: Cheryl Prince           Date of Birth: 06/08/1955           MRN: [id number] Visit Date: 08/02/2020 Requested by: Storm Frisk, MD [address] [place],  [place] [address] PCP: Storm Frisk, MD  Subjective: Chief Complaint  Patient presents with  . Right Shoulder - Pain    HPI: Cheryl Prince is a 65 y.o. female who presents to the office complaining of right shoulder pain.  Patient is left-hand dominant.  She has history of right shoulder pain for several years.  She complains of constant pain that she localizes to the anterior right shoulder.  Complains of a "deep" pain.  She has no radiation or radicular pain.  She does note occasional numbness and tingling of the second, fourth, fifth fingers with a history of bilateral carpal tunnel release in the past in [place].  She complains of mild neck pain in addition but this is about her baseline.  She does not feel the neck pain is coming from her shoulder.  She uses heat, take gabapentin and Tylenol which provide some relief and she feels she does not need to take it every day.  No history of prior right shoulder surgery.  She does have good shoulder function and is able to lift the arm overhead.  Pain occasionally wakes her up at night if she lays on the right side.  She has difficulty lifting the right arm several years ago but since then she has been doing some exercises which has really restored her function.  She is doing a home exercise program daily.  Additionally, she complains of right hip pain.  Localizes pain to the lateral hip.  She denies any history of injury to this area but she does report pain that makes it difficult to lay on the right side.  Denies any groin pain, radicular pain down the leg.  She is able to weight-bear without difficulty.  Mostly bothers her at night when she tries to sleep on that side..                ROS: All systems reviewed are negative as  they relate to the chief complaint within the history of present illness.  Patient denies fevers or chills.  Assessment & Plan: Visit Diagnoses:  1. Rotator cuff arthropathy of right shoulder   2. Trochanteric bursitis, right hip     Plan: Patient is a 65 year old female who presents complaining of right shoulder pain primarily.  She has history of rotator cuff arthropathy as diagnosed by radiographs at her last office visit.  She does have weakness of infraspinatus and supraspinatus on exam today of the right shoulder but overall her function is actually very good and she is able to easily lift the arm over her head.  Mostly what bothers her is trying to sleep on the right side due to her shoulder and hip pain as well as lifting anything significantly heavy with the right arm.  Her symptoms are not currently bothering her enough for surgery or for an injection.  Regarding the right hip, she would like to try some therapy to see if this will help her hip pain.  She is not interested in injection for this either.  She had recent hip radiographs which were reviewed today and showed no significant degeneration of either hip joint.  Plan for physical therapy for right trochanteric bursitis  and reevaluate patient in 6 weeks.  Follow-Up Instructions: No follow-ups on file.   Orders:  Orders Placed This Encounter  Procedures  . XR Shoulder Right  . Ambulatory referral to Occupational Therapy   No orders of the defined types were placed in this encounter.     Procedures: No procedures performed   Clinical Data: No additional findings.  Objective: Vital Signs: There were no vitals taken for this visit.  Physical Exam:  Constitutional: Patient appears well-developed HEENT:  Head: Normocephalic Eyes:EOM are normal Neck: Normal range of motion Cardiovascular: Normal rate Pulmonary/chest: Effort normal Neurologic: Patient is alert Skin: Skin is warm Psychiatric: Patient has normal mood  and affect  Ortho Exam: Ortho exam demonstrates right shoulder with 40 degrees external rotation, 90 degrees abduction, 155 degrees forward flexion.  This compared with the left shoulder with 50 degrees external rotation, 90 degrees abduction, 170 degrees forward flexion.  Right shoulder with moderate weakness of infraspinatus and mild weakness of supraspinatus.  Excellent strength of the subscapularis.  5/5 motor strength of bilateral grip strength, finger abduction, pronation/supination, bicep, tricep, deltoid.  No significant pain in the shoulder with active cervical spine range of motion.  She does have moderate tenderness over the bicipital groove.  No tenderness over the [facility] joint.  Negative Hornblower sign.  Negative drop arm test.  Active motion is equivalent to the passive motion of the right shoulder.  Right hip with tenderness over the greater trochanter on the right and with much less tenderness over the left greater trochanter.  No pain with hip range of motion such as internal rotation and hip flexion.  Negative Stinchfield sign.  Negative straight leg raise.  Specialty Comments:  No specialty comments available.  Imaging: No results found.   PMFS History: Patient Active Problem List   Diagnosis Date Noted  . Bipolar I disorder (HCC) 07/26/2020  . Right hip pain 07/26/2020  . Right shoulder pain 07/26/2020  . Bronchitis 07/26/2020  . Allergies 07/26/2020  . GERD (gastroesophageal reflux disease) 07/26/2020  . At risk for decreased bone density 07/26/2020  . Arthritis of left knee 09/22/2018  . PTSD (post-traumatic stress disorder) 05/30/2016  . Alpha thalassemia trait 05/30/2016  . Hypertension 05/30/2016  . Rotator cuff tendonitis, left 05/30/2016  . Osteoarthritis of left knee 05/30/2016  . Carpal tunnel syndrome 05/30/2016  . Prediabetes 05/30/2016  . Onychomycosis 05/30/2016  . Goiter, toxic diffuse 05/30/2016   Past Medical History:  Diagnosis Date  . Allergy    . Anemia   . Anxiety   . Arthritis   . Asthma    borderline bronchitis  . Bronchitis   . GERD (gastroesophageal reflux disease)   . Hypertension   . S/P tonsillectomy 05/30/2016  . Sleep apnea    history of, MD tookpt. off machine 7 yrs, ago    Family History  Problem Relation Age of Onset  . Hypertension Father   . Diabetes Father   . Lupus Daughter   . Colon cancer Maternal Aunt   . Colon cancer Maternal Uncle        4 mat uncles dx colon ca  . Esophageal cancer Neg Hx   . Rectal cancer Neg Hx   . Stomach cancer Neg Hx     Past Surgical History:  Procedure Laterality Date  . ABDOMINAL HYSTERECTOMY    . adnoides    . BREAST SURGERY    . CHOLECYSTECTOMY    . COLONOSCOPY    . DILATION AND CURETTAGE OF  UTERUS    . GASTRIC BYPASS  1998  . IUD REMOVAL    . TONSILLECTOMY    . TOTAL KNEE ARTHROPLASTY Left 09/22/2018  . TOTAL KNEE ARTHROPLASTY Left 09/22/2018   Procedure: LEFT TOTAL KNEE ARTHROPLASTY;  Surgeon: Cammy Copa, MD;  Location: [facility] OR;  Service: Orthopedics;  Laterality: Left;   Social History   Occupational History  . Not on file  Tobacco Use  . Smoking status: Former Smoker    Packs/day: 1.50    Years: 18.00    Pack years: 27.00    Types: Cigarettes    Quit date: 09/16/1984    Years since quitting: 35.9  . Smokeless tobacco: Never Used  . Tobacco comment: quit 1986  Vaping Use  . Vaping Use: Never used  Substance and Sexual Activity  . Alcohol use: Yes    Comment: social  . Drug use: No  . Sexual activity: Not on file

## 2020-08-17 ENCOUNTER — Encounter: Payer: Self-pay | Admitting: Physical Therapy

## 2020-08-17 ENCOUNTER — Other Ambulatory Visit: Payer: Self-pay

## 2020-08-17 ENCOUNTER — Ambulatory Visit: Payer: Medicare Other | Attending: Orthopedic Surgery | Admitting: Physical Therapy

## 2020-08-17 DIAGNOSIS — M6281 Muscle weakness (generalized): Secondary | ICD-10-CM | POA: Insufficient documentation

## 2020-08-17 DIAGNOSIS — M25551 Pain in right hip: Secondary | ICD-10-CM

## 2020-08-17 NOTE — Therapy (Signed)
[facility] [address] [place], [place], [address] Phone: [phone]   Fax:  [phone]  Physical Therapy Evaluation  Patient Details  Name: Cheryl Prince MRN: [id number] Date of Birth: May 12, 1955 Referring Provider (PT): August Saucerean, Corrie MckusickGregory Scott, MD   Encounter Date: 08/17/2020   PT End of Session - 08/17/20 1348     Visit Number 1    Number of Visits 8    Date for PT Re-Evaluation 10/12/20    Authorization Type UHC MCR    Authorization Time Period FOTO by 6th, KX by 15th    Progress Note Due on Visit 10    PT Start Time 1350    PT Stop Time 1435    PT Time Calculation (min) 45 min    Activity Tolerance Patient tolerated treatment well    Behavior During Therapy [facility] for tasks assessed/performed             Past Medical History:  Diagnosis Date   Allergy    Anemia    Anxiety    Arthritis    Asthma    borderline bronchitis   Bronchitis    GERD (gastroesophageal reflux disease)    Hypertension    S/P tonsillectomy 05/30/2016   Sleep apnea    history of, MD tookpt. off machine 7 yrs, ago    Past Surgical History:  Procedure Laterality Date   ABDOMINAL HYSTERECTOMY     adnoides     BREAST SURGERY     CHOLECYSTECTOMY     COLONOSCOPY     DILATION AND CURETTAGE OF UTERUS     GASTRIC BYPASS  1998   IUD REMOVAL     TONSILLECTOMY     TOTAL KNEE ARTHROPLASTY Left 09/22/2018   TOTAL KNEE ARTHROPLASTY Left 09/22/2018   Procedure: LEFT TOTAL KNEE ARTHROPLASTY;  Surgeon: Cammy Copaean, Gregory Scott, MD;  Location: [facility] OR;  Service: Orthopedics;  Laterality: Left;    There were no vitals filed for this visit.    Subjective Assessment - 08/17/20 1353     Subjective Patient reports right hip pain, tenderness to touch right hip and increased pain when she leans onto or sleeps on right hip, which will wake her up at night. She also reports that she can't walk as long or as fast as she used to on the treadmill. This  most recent episode started around April 2022 and hasn't gone away, but she also had an episode that went away with exercise.    Limitations Standing;Walking;House hold activities;Lifting;Sitting    How long can you sit comfortably? 15 minutes    How long can you walk comfortably? 20-25 minutes on treadmill at 1.8 mph, 5 minutes at 2.2 mph    Patient Stated Goals Get rid of hip pain to return to walking without limitation    Currently in Pain? Yes    Pain Score 4     Pain Location Hip    Pain Orientation Right    Pain Descriptors / Indicators Tender;Aching    Pain Type Chronic pain    Pain Onset More than a month ago    Aggravating Factors  Sitting or leaning on the right side, lying on the right side to sleep, walking on treadmill    Pain Relieving Factors Rest, medication                [facility] PT Assessment - 08/17/20 0001       Assessment   Medical Diagnosis Trochanteric bursitis, right hip  Referring Provider (PT) August Saucer Corrie Mckusick, MD    Onset Date/Surgical Date --   patient reports most episode in Aprill 2022   Prior Therapy Yes - hip and knee      Precautions   Precautions None      Restrictions   Weight Bearing Restrictions No      Balance Screen   Has the patient fallen in the past 6 months No    Has the patient had a decrease in activity level because of a fear of falling?  No    Is the patient reluctant to leave their home because of a fear of falling?  No      Home Environment   Living Environment Private residence    Type of Home House    Home Access Stairs to enter    Entrance Stairs-Number of Steps 1    Home Layout Two level    Alternate Level Stairs-Number of Steps 15    Alternate Level Stairs-Rails Right      Prior Function   Level of Independence Independent    Leisure [place], swimming, boche ball, movies      Cognition   Overall Cognitive Status Within Functional Limits for tasks assessed      Observation/Other Assessments    Observations Patient appears in no apparent distress    Focus on Therapeutic Outcomes (FOTO)  40% functional status      Sensation   Light Touch Appears Intact      Coordination   Gross Motor Movements are Fluid and Coordinated Yes      Functional Tests   Functional tests Single leg stance      Single Leg Stance   Comments Greater difficulty on right with trendelenburg      ROM / Strength   AROM / PROM / Strength AROM;PROM;Strength      AROM   Overall AROM Comments Lumbar AROM grossly WFL and non-painful      PROM   Overall PROM Comments Hip PROM grossly WFL and non-painful      Strength   Overall Strength Comments patient reports achiness with right hip abduction    Strength Assessment Site Hip;Knee    Right/Left Hip Right;Left    Right Hip Flexion 4-/5    Right Hip Extension 3+/5    Right Hip ABduction 3/5    Left Hip Flexion 4/5    Left Hip Extension 4-/5    Left Hip ABduction 3+/5    Right/Left Knee Right;Left    Right Knee Flexion 5/5    Right Knee Extension 5/5    Left Knee Flexion 5/5    Left Knee Extension 5/5      Flexibility   Soft Tissue Assessment /Muscle Length yes    Hamstrings WFL      Transfers   Transfers Independent with all Transfers      Ambulation/Gait   Ambulation/Gait Yes    Ambulation/Gait Assistance 7: Independent    Gait Comments Trendelenburg greater on right                        Objective measurements completed on examination: See above findings.       [facility] Adult PT Treatment/Exercise - 08/17/20 0001       Exercises   Exercises Knee/Hip      Knee/Hip Exercises: Standing   SLS 3 x 30 sec focusing on maintain level pelvis alignment      Knee/Hip Exercises: Supine   Henreitta Leber  3 sets;10 reps    Bridges Limitations yellow band around knees      Knee/Hip Exercises: Sidelying   Clams 3 x 15 with yellow band                    PT Education - 08/17/20 1348     Education Details Exam findings,  POC, HEP    Person(s) Educated Patient    Methods Explanation;Demonstration;Tactile cues;Verbal cues;Handout    Comprehension Verbalized understanding;Returned demonstration;Verbal cues required;Tactile cues required;Need further instruction              PT Short Term Goals - 08/17/20 1351       PT SHORT TERM GOAL #1   Title Patient will be I with initial HEP to progress with PT    Time 4    Period Weeks    Status New    Target Date 09/14/20      PT SHORT TERM GOAL #2   Title PT will review FOTO with patient by 3rd visit to understand expected progress    Time 4    Period Weeks    Status New    Target Date 09/14/20      PT SHORT TERM GOAL #3   Title Patient will report </= 2/10 pain at rest to reduce functional limitation    Time 4    Period Weeks    Status New    Target Date 09/14/20               PT Long Term Goals - 08/17/20 1352       PT LONG TERM GOAL #1   Title Patient will be I with final HEP to maintain progress from PT    Time 8    Period Weeks    Status New    Target Date 10/12/20      PT LONG TERM GOAL #2   Title Patient will report >/= 58% functional status on FOTO to demonstrate improved functional ability    Time 8    Period Weeks    Status New    Target Date 10/12/20      PT LONG TERM GOAL #3   Title Patient will be able to sit >/= 1 hour without getting up to improve sitting at church    Time 8    Period Weeks    Status New    Target Date 10/12/20      PT LONG TERM GOAL #4   Title Patient will report no limitation lying on right side to improve sleeping ability    Time 8    Period Weeks    Status New    Target Date 10/12/20      PT LONG TERM GOAL #5   Title Patient will exhibit right gluteal strength >/= 4/5 MMT to improve ability to perform household tasks    Time 8    Period Weeks    Status New    Target Date 10/12/20      Additional Long Term Goals   Additional Long Term Goals Yes      PT LONG TERM GOAL #6   Title  Patient will be able to walk at 2.2-2.4 mph on treadmill for >/= 20 minutes to improve exercise ability    Time 8    Period Weeks    Status New    Target Date 10/12/20  Plan - 08/17/20 1409     Clinical Impression Statement Patient presents to PT with report of chronic right hip pain that seems consistent with greater trochanteric pain syndrome. He exhibits weakness or right hip abductors with concordant pain, trendelenburg with SLS on right with, pain lying on the right side or shifting weight onto the right hip while sitting. Patient was provided exercises to initiate hip strengthening and pelvic control with SLS. She would benefit from continued skilled PT to progress hip strength and control to reduce pain and maximize functional level to return to walking and acitivity without limitation.    Personal Factors and Comorbidities Fitness;Time since onset of injury/illness/exacerbation;Past/Current Experience    Examination-Activity Limitations Locomotion Level;Sit;Squat;Stairs;Stand;Lift;Sleep    Examination-Participation Restrictions Church;Meal Prep;Cleaning;Community Activity;Driving;Shop;Laundry;Yard Work    Stability/Clinical Decision Making Stable/Uncomplicated    Optometrist Low    Rehab Potential Good    PT Frequency 1x / week    PT Duration 8 weeks    PT Treatment/Interventions ADLs/Self Care Home Management;Aquatic Therapy;Cryotherapy;Electrical Stimulation;Iontophoresis 4mg /ml Dexamethasone;Moist Heat;Traction;Ultrasound;Neuromuscular re-education;Balance training;Therapeutic exercise;Therapeutic activities;Functional mobility training;Stair training;Gait training;Patient/family education;Manual techniques;Dry needling;Passive range of motion;Taping;Vasopneumatic Device;Spinal Manipulations;Joint Manipulations    PT Next Visit Plan Review HEP and progress PRN, manual right HEP PRN, progress gluteal strength and pelvic control (hip hikes,  step-ups), SLS, possible ionto    PT Home Exercise Plan [email or web address]    Consulted and Agree with Plan of Care Patient             Patient will benefit from skilled therapeutic intervention in order to improve the following deficits and impairments:  Pain, Decreased activity tolerance, Postural dysfunction, Decreased strength, Decreased balance  Visit Diagnosis: Pain in right hip  Muscle weakness (generalized)     Problem List Patient Active Problem List   Diagnosis Date Noted   Bipolar I disorder (HCC) 07/26/2020   Right hip pain 07/26/2020   Right shoulder pain 07/26/2020   Bronchitis 07/26/2020   Allergies 07/26/2020   GERD (gastroesophageal reflux disease) 07/26/2020   At risk for decreased bone density 07/26/2020   Arthritis of left knee 09/22/2018   PTSD (post-traumatic stress disorder) 05/30/2016   Alpha thalassemia trait 05/30/2016   Hypertension 05/30/2016   Rotator cuff tendonitis, left 05/30/2016   Osteoarthritis of left knee 05/30/2016   Carpal tunnel syndrome 05/30/2016   Prediabetes 05/30/2016   Onychomycosis 05/30/2016   Goiter, toxic diffuse 05/30/2016    07/30/2016, PT, DPT, LAT, ATC 08/17/20  3:31 PM Phone: [phone] Fax: [phone]   [facility] [address] [place], [place], [place] Phone: [phone]   Fax:  [phone]  Name: Isabell Bonafede MRN: Cheryl Croak Date of Birth: [birth year]

## 2020-08-17 NOTE — Patient Instructions (Signed)
Access Code: [email or web address] URL: [email or web address] Date: 08/17/2020 Prepared by: Rosana Hoes  Exercises Clamshell with Resistance - 1 x daily - 5 x weekly - 3 sets - 15 reps - 2 hold Supine Bridge with Resistance Band - 1 x daily - 5 x weekly - 3 sets - 10 reps Single Leg Stance - 1 x daily - 5 x weekly - 3 sets - 30 hold

## 2020-08-22 ENCOUNTER — Telehealth: Payer: Self-pay

## 2020-08-22 NOTE — Telephone Encounter (Signed)
Patient name and DOB has been verified Patient was informed of lab results. Patient had no questions.  

## 2020-08-22 NOTE — Telephone Encounter (Signed)
-----   Message from Storm Frisk, MD sent at 07/31/2020  6:02 AM EDT ----- Let pt know hip xray are negative.  I suspect due to ileotibial band inflammation.   Recommend stretching exercises to the right leg, can refer to Phys therapy if she would like

## 2020-08-31 ENCOUNTER — Other Ambulatory Visit: Payer: Self-pay

## 2020-08-31 ENCOUNTER — Encounter: Payer: Self-pay | Admitting: Physical Therapy

## 2020-08-31 ENCOUNTER — Ambulatory Visit: Payer: Medicare Other | Attending: Orthopedic Surgery | Admitting: Physical Therapy

## 2020-08-31 DIAGNOSIS — M25551 Pain in right hip: Secondary | ICD-10-CM | POA: Insufficient documentation

## 2020-08-31 DIAGNOSIS — M6281 Muscle weakness (generalized): Secondary | ICD-10-CM | POA: Insufficient documentation

## 2020-08-31 NOTE — Patient Instructions (Signed)
Access Code: [email or web address] URL: [email or web address] Date: 08/31/2020 Prepared by: Rosana Hoes  Exercises Sidelying Hip Abduction - 1 x daily - 5 x weekly - 2 sets - 10 reps Bridge with Hip Abduction and Resistance - Ground Touches - 1 x daily - 5 x weekly - 2 sets - 10 reps Single Leg Stance - 5 x weekly - 3 sets - 15-30 hold Hip Hiking on Step - 1 x daily - 5 x weekly - 2 sets - 10 reps

## 2020-08-31 NOTE — Therapy (Signed)
[facility] [address] [place], [place], [address] Phone: [phone]   Fax:  [phone]  Physical Therapy Treatment  Patient Details  Name: Cheryl Prince MRN: [id number] Date of Birth: 1955/05/08 Referring Provider (PT): Cheryl Prince Cheryl Mckusick, MD   Encounter Date: 08/31/2020   PT End of Session - 08/31/20 1054     Visit Number 2    Number of Visits 8    Date for PT Re-Evaluation 10/12/20    Authorization Type UHC MCR    Authorization Time Period FOTO by 6th, KX by 15th    Progress Note Due on Visit 10    PT Start Time 1052    PT Stop Time 1130    PT Time Calculation (min) 38 min    Activity Tolerance Patient tolerated treatment well    Behavior During Therapy [facility] for tasks assessed/performed             Past Medical History:  Diagnosis Date   Allergy    Anemia    Anxiety    Arthritis    Asthma    borderline bronchitis   Bronchitis    GERD (gastroesophageal reflux disease)    Hypertension    S/P tonsillectomy 05/30/2016   Sleep apnea    history of, MD tookpt. off machine 7 yrs, ago    Past Surgical History:  Procedure Laterality Date   ABDOMINAL HYSTERECTOMY     adnoides     BREAST SURGERY     CHOLECYSTECTOMY     COLONOSCOPY     DILATION AND CURETTAGE OF UTERUS     GASTRIC BYPASS  1998   IUD REMOVAL     TONSILLECTOMY     TOTAL KNEE ARTHROPLASTY Left 09/22/2018   TOTAL KNEE ARTHROPLASTY Left 09/22/2018   Procedure: LEFT TOTAL KNEE ARTHROPLASTY;  Surgeon: Cheryl Copa, MD;  Location: [facility] OR;  Service: Orthopedics;  Laterality: Left;    There were no vitals filed for this visit.   Subjective Assessment - 08/31/20 1055     Subjective Patient reports she is doing well, she has been consistent with her exercises. She states her hip is feeling better, she only noticed it twice on her trip to [place].    Patient Stated Goals Get rid of hip pain to return to walking without limitation     Currently in Pain? No/denies                [facility] PT Assessment - 08/31/20 0001       Single Leg Stance   Comments Trendelenburg on right      Strength   Right Hip ABduction 3+/5                           [facility] Adult PT Treatment/Exercise - 08/31/20 0001       Exercises   Exercises Knee/Hip      Knee/Hip Exercises: Aerobic   Nustep L5 x 5 min with UE/LE while taking subjective      Knee/Hip Exercises: Standing   SLS 3 x 30 sec each focusing on maintain level pelvis alignment    Other Standing Knee Exercises Hip hike on 2" step 2 x 10 each      Knee/Hip Exercises: Supine   Bridges 10 reps    Bridges with Clamshell 2 sets;10 reps   red band     Knee/Hip Exercises: Sidelying   Hip ABduction 2 sets;10 reps  PT Education - 08/31/20 1054     Education Details HEP update    Person(s) Educated Patient    Methods Explanation;Demonstration;Verbal cues;Handout    Comprehension Verbalized understanding;Returned demonstration;Verbal cues required;Need further instruction              PT Short Term Goals - 08/17/20 1351       PT SHORT TERM GOAL #1   Title Patient will be I with initial HEP to progress with PT    Time 4    Period Weeks    Status New    Target Date 09/14/20      PT SHORT TERM GOAL #2   Title PT will review FOTO with patient by 3rd visit to understand expected progress    Time 4    Period Weeks    Status New    Target Date 09/14/20      PT SHORT TERM GOAL #3   Title Patient will report </= 2/10 pain at rest to reduce functional limitation    Time 4    Period Weeks    Status New    Target Date 09/14/20               PT Long Term Goals - 08/17/20 1352       PT LONG TERM GOAL #1   Title Patient will be I with final HEP to maintain progress from PT    Time 8    Period Weeks    Status New    Target Date 10/12/20      PT LONG TERM GOAL #2   Title Patient will report >/= 58%  functional status on FOTO to demonstrate improved functional ability    Time 8    Period Weeks    Status New    Target Date 10/12/20      PT LONG TERM GOAL #3   Title Patient will be able to sit >/= 1 hour without getting up to improve sitting at church    Time 8    Period Weeks    Status New    Target Date 10/12/20      PT LONG TERM GOAL #4   Title Patient will report no limitation lying on right side to improve sleeping ability    Time 8    Period Weeks    Status New    Target Date 10/12/20      PT LONG TERM GOAL #5   Title Patient will exhibit right gluteal strength >/= 4/5 MMT to improve ability to perform household tasks    Time 8    Period Weeks    Status New    Target Date 10/12/20      Additional Long Term Goals   Additional Long Term Goals Yes      PT LONG TERM GOAL #6   Title Patient will be able to walk at 2.2-2.4 mph on treadmill for >/= 20 minutes to improve exercise ability    Time 8    Period Weeks    Status New    Target Date 10/12/20                   Plan - 08/31/20 1100     Clinical Impression Statement Patient tolerated therapy well with no adverse effects. Therapy focused on progressing gluteal strength this visit. Patient exhibits overall improvement with only occasional pain and was able to progress well with strengthening exercises. She does continue to exhibit trendelenburg with SL stance  encouraged to work on maintaining level pelvis to avoid placing greater compressive forces over lateral hip region. She would benefit from continued skilled PT to progress hip strength and control to reduce pain and maximize functional level to return to walking and acitivity without limitation.    PT Treatment/Interventions ADLs/Self Care Home Management;Aquatic Therapy;Cryotherapy;Electrical Stimulation;Iontophoresis 4mg /ml Dexamethasone;Moist Heat;Traction;Ultrasound;Neuromuscular re-education;Balance training;Therapeutic exercise;Therapeutic  activities;Functional mobility training;Stair training;Gait training;Patient/family education;Manual techniques;Dry needling;Passive range of motion;Taping;Vasopneumatic Device;Spinal Manipulations;Joint Manipulations    PT Next Visit Plan Review HEP and progress PRN, manual right HEP PRN, progress gluteal strength and pelvic control (hip hikes, step-ups), SLS, possible ionto    PT Home Exercise Plan [email or web address]    Consulted and Agree with Plan of Care Patient             Patient will benefit from skilled therapeutic intervention in order to improve the following deficits and impairments:  Pain, Decreased activity tolerance, Postural dysfunction, Decreased strength, Decreased balance  Visit Diagnosis: Pain in right hip  Muscle weakness (generalized)     Problem List Patient Active Problem List   Diagnosis Date Noted   Bipolar I disorder (HCC) 07/26/2020   Right hip pain 07/26/2020   Right shoulder pain 07/26/2020   Bronchitis 07/26/2020   Allergies 07/26/2020   GERD (gastroesophageal reflux disease) 07/26/2020   At risk for decreased bone density 07/26/2020   Arthritis of left knee 09/22/2018   PTSD (post-traumatic stress disorder) 05/30/2016   Alpha thalassemia trait 05/30/2016   Hypertension 05/30/2016   Rotator cuff tendonitis, left 05/30/2016   Osteoarthritis of left knee 05/30/2016   Carpal tunnel syndrome 05/30/2016   Prediabetes 05/30/2016   Onychomycosis 05/30/2016   Goiter, toxic diffuse 05/30/2016    07/30/2016, PT, DPT, LAT, ATC 08/31/20  11:32 AM Phone: [phone] Fax: [phone]   [facility] [address] [place], [place], [place] Phone: [phone]   Fax:  [phone]  Name: Cheryl Prince MRN: [name] Date of Birth: March 16, 1955

## 2020-09-07 ENCOUNTER — Encounter: Payer: Self-pay | Admitting: Physical Therapy

## 2020-09-07 ENCOUNTER — Other Ambulatory Visit: Payer: Self-pay

## 2020-09-07 ENCOUNTER — Ambulatory Visit: Payer: Medicare Other | Admitting: Physical Therapy

## 2020-09-07 DIAGNOSIS — M6281 Muscle weakness (generalized): Secondary | ICD-10-CM | POA: Diagnosis not present

## 2020-09-07 DIAGNOSIS — M25551 Pain in right hip: Secondary | ICD-10-CM

## 2020-09-07 NOTE — Patient Instructions (Signed)
Access Code: [email or web address] URL: [email or web address] Date: 09/07/2020 Prepared by: Rosana Hoes  Exercises Sidelying Hip Abduction - 1 x daily - 5 x weekly - 2 sets - 10 reps Bridge with Hip Abduction and Resistance - Ground Touches - 1 x daily - 5 x weekly - 2 sets - 10 reps Single Leg Stance - 5 x weekly - 3 sets - 15-30 hold Hip Hiking on Step - 1 x daily - 5 x weekly - 2 sets - 10 reps Side Stepping with Resistance at Thighs and Counter Support - 1 x daily - 7 x weekly - 3 sets - 15 reps

## 2020-09-07 NOTE — Therapy (Signed)
[facility] [address] [place], [place], [address] Phone: [phone]   Fax:  [phone]  Physical Therapy Treatment  Patient Details  Name: Cheryl Prince MRN: [id number] Date of Birth: 1955/07/12 Referring Provider (PT): August Saucer Corrie Mckusick, MD   Encounter Date: 09/07/2020   PT End of Session - 09/07/20 1403     Visit Number 3    Number of Visits 8    Date for PT Re-Evaluation 10/12/20    Authorization Type UHC MCR    Authorization Time Period FOTO by 6th, KX by 15th    Progress Note Due on Visit 10    PT Start Time 1358    PT Stop Time 1438    PT Time Calculation (min) 40 min    Activity Tolerance Patient tolerated treatment well    Behavior During Therapy [facility] for tasks assessed/performed             Past Medical History:  Diagnosis Date   Allergy    Anemia    Anxiety    Arthritis    Asthma    borderline bronchitis   Bronchitis    GERD (gastroesophageal reflux disease)    Hypertension    S/P tonsillectomy 05/30/2016   Sleep apnea    history of, MD tookpt. off machine 7 yrs, ago    Past Surgical History:  Procedure Laterality Date   ABDOMINAL HYSTERECTOMY     adnoides     BREAST SURGERY     CHOLECYSTECTOMY     COLONOSCOPY     DILATION AND CURETTAGE OF UTERUS     GASTRIC BYPASS  1998   IUD REMOVAL     TONSILLECTOMY     TOTAL KNEE ARTHROPLASTY Left 09/22/2018   TOTAL KNEE ARTHROPLASTY Left 09/22/2018   Procedure: LEFT TOTAL KNEE ARTHROPLASTY;  Surgeon: Cammy Copa, MD;  Location: [facility] OR;  Service: Orthopedics;  Laterality: Left;    There were no vitals filed for this visit.   Subjective Assessment - 09/07/20 1401     Subjective Patient reports she is doing well, no new issues. She went bowling yesterday with no issues.    Patient Stated Goals Get rid of hip pain to return to walking without limitation    Currently in Pain? No/denies                [facility] PT Assessment -  09/07/20 0001       Single Leg Stance   Comments Trendelenburg on right with longer periods of SLS      Strength   Right Hip Extension 3+/5    Right Hip ABduction 3+/5                           [facility] Adult PT Treatment/Exercise - 09/07/20 0001       Exercises   Exercises Knee/Hip      Knee/Hip Exercises: Aerobic   Nustep L5 x 5 min with UE/LE while taking subjective      Knee/Hip Exercises: Standing   Lateral Step Up 2 sets;10 reps    Lateral Step Up Limitations 6" step height, step up and over    SLS 3 x 30 sec each focusing on maintain level pelvis alignment    Other Standing Knee Exercises Hip hike on 2" step 2 x 10 each    Other Standing Knee Exercises Lateral band walk at counter with green around knees 3 x 15  Knee/Hip Exercises: Supine   Bridges with Clamshell 2 sets;10 reps   green band     Knee/Hip Exercises: Sidelying   Hip ABduction 2 sets;10 reps    Clams 2 x 15 with green   small ball placed between feet                   PT Education - 09/07/20 1403     Education Details HEP progression with green band    Person(s) Educated Patient    Methods Explanation;Demonstration;Verbal cues    Comprehension Verbalized understanding;Returned demonstration;Verbal cues required;Need further instruction              PT Short Term Goals - 09/07/20 1410       PT SHORT TERM GOAL #1   Title Patient will be I with initial HEP to progress with PT    Baseline progressing with HEP    Time 4    Period Weeks    Status On-going    Target Date 09/14/20      PT SHORT TERM GOAL #2   Title PT will review FOTO with patient by 3rd visit to understand expected progress    Baseline reviewed on 3rd visit    Time 4    Period Weeks    Status On-going    Target Date 09/14/20      PT SHORT TERM GOAL #3   Title Patient will report </= 2/10 pain at rest to reduce functional limitation    Baseline patient report 0/10 pain at rest    Time 4     Period Weeks    Status Achieved    Target Date 09/14/20               PT Long Term Goals - 08/17/20 1352       PT LONG TERM GOAL #1   Title Patient will be I with final HEP to maintain progress from PT    Time 8    Period Weeks    Status New    Target Date 10/12/20      PT LONG TERM GOAL #2   Title Patient will report >/= 58% functional status on FOTO to demonstrate improved functional ability    Time 8    Period Weeks    Status New    Target Date 10/12/20      PT LONG TERM GOAL #3   Title Patient will be able to sit >/= 1 hour without getting up to improve sitting at church    Time 8    Period Weeks    Status New    Target Date 10/12/20      PT LONG TERM GOAL #4   Title Patient will report no limitation lying on right side to improve sleeping ability    Time 8    Period Weeks    Status New    Target Date 10/12/20      PT LONG TERM GOAL #5   Title Patient will exhibit right gluteal strength >/= 4/5 MMT to improve ability to perform household tasks    Time 8    Period Weeks    Status New    Target Date 10/12/20      Additional Long Term Goals   Additional Long Term Goals Yes      PT LONG TERM GOAL #6   Title Patient will be able to walk at 2.2-2.4 mph on treadmill for >/= 20 minutes to improve exercise ability  Time 8    Period Weeks    Status New    Target Date 10/12/20                   Plan - 09/07/20 1404     Clinical Impression Statement Patient tolerated therapy well with no adverse effects. Therapy contiued focus on glute med strengthening with good tolerance. Patient demonstrates improved SLS without trendelenburg but does exhibit endurance deficit when holding for multiple repetitions. Provided patient with stronger band for HEP and added band walks at counter. She would benefit from continued skilled PT to progress hip strength and control to reduce pain and maximize functional level to return to walking and acitivity without  limitation.    PT Treatment/Interventions ADLs/Self Care Home Management;Aquatic Therapy;Cryotherapy;Electrical Stimulation;Iontophoresis 4mg /ml Dexamethasone;Moist Heat;Traction;Ultrasound;Neuromuscular re-education;Balance training;Therapeutic exercise;Therapeutic activities;Functional mobility training;Stair training;Gait training;Patient/family education;Manual techniques;Dry needling;Passive range of motion;Taping;Vasopneumatic Device;Spinal Manipulations;Joint Manipulations    PT Next Visit Plan Review HEP and progress PRN, manual right HEP PRN, progress gluteal strength and pelvic control (hip hikes, step-ups), SLS, possible ionto    PT Home Exercise Plan [email or web address]    Consulted and Agree with Plan of Care Patient             Patient will benefit from skilled therapeutic intervention in order to improve the following deficits and impairments:  Pain, Decreased activity tolerance, Postural dysfunction, Decreased strength, Decreased balance  Visit Diagnosis: Pain in right hip  Muscle weakness (generalized)     Problem List Patient Active Problem List   Diagnosis Date Noted   Bipolar I disorder (HCC) 07/26/2020   Right hip pain 07/26/2020   Right shoulder pain 07/26/2020   Bronchitis 07/26/2020   Allergies 07/26/2020   GERD (gastroesophageal reflux disease) 07/26/2020   At risk for decreased bone density 07/26/2020   Arthritis of left knee 09/22/2018   PTSD (post-traumatic stress disorder) 05/30/2016   Alpha thalassemia trait 05/30/2016   Hypertension 05/30/2016   Rotator cuff tendonitis, left 05/30/2016   Osteoarthritis of left knee 05/30/2016   Carpal tunnel syndrome 05/30/2016   Prediabetes 05/30/2016   Onychomycosis 05/30/2016   Goiter, toxic diffuse 05/30/2016    07/30/2016, PT, DPT, LAT, ATC 09/07/20  2:40 PM Phone: [phone] Fax: [phone]   [facility] [address] [place], [place],  [place] Phone: [phone]   Fax:  [phone]  Name: Cheryl Prince MRN: [name] Date of Birth: December 14, 1955

## 2020-09-14 ENCOUNTER — Ambulatory Visit: Payer: Medicare Other | Admitting: Physical Therapy

## 2020-09-21 ENCOUNTER — Ambulatory Visit: Payer: Medicare Other | Admitting: Physical Therapy

## 2020-09-21 ENCOUNTER — Encounter: Payer: Self-pay | Admitting: Physical Therapy

## 2020-09-21 ENCOUNTER — Other Ambulatory Visit: Payer: Self-pay

## 2020-09-21 DIAGNOSIS — M6281 Muscle weakness (generalized): Secondary | ICD-10-CM | POA: Diagnosis not present

## 2020-09-21 DIAGNOSIS — M25551 Pain in right hip: Secondary | ICD-10-CM | POA: Diagnosis not present

## 2020-09-21 NOTE — Therapy (Signed)
[place], [place], [address] Phone: [phone]   Fax:  [phone]  Physical Therapy Treatment / Discharge  Patient Details  Name: Cheryl Prince MRN: [id number] Date of Birth: 11-16-1955 Referring Provider (PT): Marlou Sa Tonna Corner, MD   Encounter Date: 09/21/2020   PT End of Session - 09/21/20 1414     Visit Number 4    Number of Visits 8    Date for PT Re-Evaluation 10/12/20    Authorization Type UHC MCR    Authorization Time Period FOTO by 6th, KX by 15th    Progress Note Due on Visit 10    PT Start Time 1405    PT Stop Time 1445    PT Time Calculation (min) 40 min    Activity Tolerance Patient tolerated treatment well    Behavior During Therapy [facility] for tasks assessed/performed             Past Medical History:  Diagnosis Date   Allergy    Anemia    Anxiety    Arthritis    Asthma    borderline bronchitis   Bronchitis    GERD (gastroesophageal reflux disease)    Hypertension    S/P tonsillectomy 05/30/2016   Sleep apnea    history of, MD tookpt. off machine 7 yrs, ago    Past Surgical History:  Procedure Laterality Date   ABDOMINAL HYSTERECTOMY     adnoides     BREAST SURGERY     CHOLECYSTECTOMY     COLONOSCOPY     DILATION AND CURETTAGE OF UTERUS     GASTRIC BYPASS  1998   IUD REMOVAL     TONSILLECTOMY     TOTAL KNEE ARTHROPLASTY Left 09/22/2018   TOTAL KNEE ARTHROPLASTY Left 09/22/2018   Procedure: LEFT TOTAL KNEE ARTHROPLASTY;  Surgeon: Meredith Pel, MD;  Location: [place];  Service: Orthopedics;  Laterality: Left;    There were no vitals filed for this visit.   Subjective Assessment - 09/21/20 1407     Subjective Patient reports she is doing well, with no new issues. She has a bowling tournament on August 6th and 7th. States the hip has not been bother her and she has been consistent with her exercises. She did walking on the treadmill at 2.4 mph for 7  minutes, then lowered it down to 2.2 mph with no issues. She is walking up to a half a mile.    How long can you walk comfortably? Patient reports she is walk about half a mile    Patient Stated Goals Get rid of hip pain to return to walking without limitation    Currently in Pain? No/denies                [facility] PT Assessment - 09/21/20 0001       Assessment   Medical Diagnosis Trochanteric bursitis, right hip    Referring Provider (PT) Meredith Pel, MD      Precautions   Precautions None      Restrictions   Weight Bearing Restrictions No      Observation/Other Assessments   Focus on Therapeutic Outcomes (FOTO)  84% functional status      Strength   Right Hip Extension 4/5    Right Hip ABduction 4/5                           [facility] Adult PT Treatment/Exercise -  09/21/20 0001       Self-Care   Self-Care Other Self-Care Comments    Other Self-Care Comments  POC discharge, HEP, FOTO      Exercises   Exercises Knee/Hip      Knee/Hip Exercises: Standing   Lateral Step Up 2 sets;10 reps    Lateral Step Up Limitations 4" step height, step up and over    SLS 3 x 30 sec each focusing on maintain level pelvis alignment    Other Standing Knee Exercises Hip hike on 2" step 2 x 10 each    Other Standing Knee Exercises Lateral band walk at counter with bluearound knees 3 x 15      Knee/Hip Exercises: Supine   Bridges with Clamshell 2 sets;10 reps   blue band     Knee/Hip Exercises: Sidelying   Hip ABduction 2 sets;10 reps    Clams 2 x 15 with blue                    PT Education - 09/21/20 1413     Education Details POC discharge, HEP    Person(s) Educated Patient    Methods Explanation;Demonstration;Verbal cues    Comprehension Verbalized understanding;Returned demonstration;Verbal cues required;Need further instruction              PT Short Term Goals - 09/21/20 1421       PT SHORT TERM GOAL #1   Title Patient will be I  with initial HEP to progress with PT    Baseline independent with HEP    Time 4    Period Weeks    Status Achieved    Target Date 09/14/20      PT SHORT TERM GOAL #2   Title PT will review FOTO with patient by 3rd visit to understand expected progress    Baseline reviewed on 3rd visit    Time 4    Period Weeks    Status Achieved    Target Date 09/14/20      PT SHORT TERM GOAL #3   Title Patient will report </= 2/10 pain at rest to reduce functional limitation    Baseline patient report 0/10 pain at rest    Time 4    Period Weeks    Status Achieved    Target Date 09/14/20               PT Long Term Goals - 09/21/20 1422       PT LONG TERM GOAL #1   Title Patient will be I with final HEP to maintain progress from PT    Baseline patient independent with final HEP    Time 8    Period Weeks    Status Achieved      PT LONG TERM GOAL #2   Title Patient will report >/= 58% functional status on FOTO to demonstrate improved functional ability    Baseline 84%    Time 8    Period Weeks    Status Achieved      PT LONG TERM GOAL #3   Title Patient will be able to sit >/= 1 hour without getting up to improve sitting at church    Baseline no limitation with sitting using cushion at church    Time 8    Period Weeks    Status Achieved      PT LONG TERM GOAL #4   Title Patient will report no limitation lying on right side to improve sleeping ability  Baseline no limitation sleeping    Time 8    Period Weeks    Status New      PT LONG TERM GOAL #5   Title Patient will exhibit right gluteal strength >/= 4/5 MMT to improve ability to perform household tasks    Baseline gluteal strength 4/5 MMT    Time 8    Period Weeks    Status Achieved      PT LONG TERM GOAL #6   Title Patient will be able to walk at 2.2-2.4 mph on treadmill for >/= 20 minutes to improve exercise ability    Baseline patient reports she is gradually working her way up on treadmill, able to walk 2.2  - 2.4 without pain    Time 8    Period Weeks    Status Achieved                   Plan - 09/21/20 1414     Clinical Impression Statement Patient tolerated therapy well with no adverse effects. Patient has achieved all established goals and demonstrates improve strength, functional ability, and denies pain with activity. She is consistent with her HEP so will transition to independent exercise and will be formally discharged from PT.    PT Treatment/Interventions ADLs/Self Care Home Management;Aquatic Therapy;Cryotherapy;Electrical Stimulation;Iontophoresis 60m/ml Dexamethasone;Moist Heat;Traction;Ultrasound;Neuromuscular re-education;Balance training;Therapeutic exercise;Therapeutic activities;Functional mobility training;Stair training;Gait training;Patient/family education;Manual techniques;Dry needling;Passive range of motion;Taping;Vasopneumatic Device;Spinal Manipulations;Joint Manipulations    PT Next Visit Plan NA - discharge    PT Home Exercise Plan [email or web address]    Consulted and Agree with Plan of Care Patient             Patient will benefit from skilled therapeutic intervention in order to improve the following deficits and impairments:  Pain, Decreased activity tolerance, Postural dysfunction, Decreased strength, Decreased balance  Visit Diagnosis: Pain in right hip  Muscle weakness (generalized)     Problem List Patient Active Problem List   Diagnosis Date Noted   Bipolar I disorder (HGary 07/26/2020   Right hip pain 07/26/2020   Right shoulder pain 07/26/2020   Bronchitis 07/26/2020   Allergies 07/26/2020   GERD (gastroesophageal reflux disease) 07/26/2020   At risk for decreased bone density 07/26/2020   Arthritis of left knee 09/22/2018   PTSD (post-traumatic stress disorder) 05/30/2016   Alpha thalassemia trait 05/30/2016   Hypertension 05/30/2016   Rotator cuff tendonitis, left 05/30/2016   Osteoarthritis of left knee 05/30/2016   Carpal tunnel  syndrome 05/30/2016   Prediabetes 05/30/2016   Onychomycosis 05/30/2016   Goiter, toxic diffuse 05/30/2016    CHilda Blades PT, DPT, LAT, ATC 09/21/20  2:45 PM Phone: [phone]: 3[facility] [place] [address]Phone: [phone]  Fax:  [phone] Name: Cheryl ParrMRN: [id number] of Birth: 5June 21, 1957  PHYSICAL THERAPY DISCHARGE SUMMARY  Visits from Start of Care: 4  Current functional level related to goals / functional outcomes: See above   Remaining deficits: See above   Education / Equipment: HEP   Patient agrees to discharge. Patient goals were met. Patient is being discharged due to meeting the stated rehab goals.

## 2020-09-21 NOTE — Patient Instructions (Signed)
Access Code: [email or web address] URL: [email or web address] Date: 09/21/2020 Prepared by: Rosana Hoes  Exercises Sidelying Hip Abduction - 1 x daily - 5 x weekly - 2 sets - 10 reps Bridge with Hip Abduction and Resistance - Ground Touches - 1 x daily - 5 x weekly - 2 sets - 10 reps Single Leg Stance - 5 x weekly - 3 sets - 15-30 hold Hip Hiking on Step - 1 x daily - 5 x weekly - 2 sets - 10 reps Side Stepping with Resistance at Thighs and Counter Support - 1 x daily - 7 x weekly - 3 sets - 15 reps Lateral Step Up with Counter Support - 1 x daily - 7 x weekly - 2 sets - 10 reps

## 2020-09-26 ENCOUNTER — Other Ambulatory Visit: Payer: Self-pay | Admitting: Critical Care Medicine

## 2020-09-26 DIAGNOSIS — R112 Nausea with vomiting, unspecified: Secondary | ICD-10-CM

## 2020-09-28 ENCOUNTER — Ambulatory Visit: Payer: Medicare Other | Admitting: Orthopedic Surgery

## 2020-12-12 ENCOUNTER — Other Ambulatory Visit: Payer: Self-pay

## 2020-12-12 ENCOUNTER — Ambulatory Visit (HOSPITAL_BASED_OUTPATIENT_CLINIC_OR_DEPARTMENT_OTHER): Payer: Medicare Other | Admitting: Critical Care Medicine

## 2020-12-12 ENCOUNTER — Encounter: Payer: Self-pay | Admitting: Critical Care Medicine

## 2020-12-12 ENCOUNTER — Ambulatory Visit: Payer: Medicare Other | Admitting: Critical Care Medicine

## 2020-12-12 DIAGNOSIS — R7303 Prediabetes: Secondary | ICD-10-CM

## 2020-12-12 DIAGNOSIS — Z9189 Other specified personal risk factors, not elsewhere classified: Secondary | ICD-10-CM | POA: Diagnosis not present

## 2020-12-12 DIAGNOSIS — I10 Essential (primary) hypertension: Secondary | ICD-10-CM

## 2020-12-12 DIAGNOSIS — M1712 Unilateral primary osteoarthritis, left knee: Secondary | ICD-10-CM | POA: Diagnosis not present

## 2020-12-12 DIAGNOSIS — M7582 Other shoulder lesions, left shoulder: Secondary | ICD-10-CM | POA: Diagnosis not present

## 2020-12-12 DIAGNOSIS — M25511 Pain in right shoulder: Secondary | ICD-10-CM

## 2020-12-12 DIAGNOSIS — M25551 Pain in right hip: Secondary | ICD-10-CM

## 2020-12-12 DIAGNOSIS — K219 Gastro-esophageal reflux disease without esophagitis: Secondary | ICD-10-CM

## 2020-12-12 NOTE — Progress Notes (Signed)
Established Patient Office Visit  Subjective:  Patient ID: Cheryl Prince, female    DOB: 01/09/1956  Age: 65 y.o. MRN: [id number] Virtual Visit via Video Note  I connected with Cheryl Prince 12/12/20 at 2p by a video enabled telemedicine application and verified that I am speaking with the correct person using two identifiers.   Consent:  I discussed the limitations, risks, security and privacy concerns of performing an evaluation and management service by video visit and the availability of in person appointments. I also discussed with the patient that there may be a patient responsible charge related to this service. The patient expressed understanding and agreed to proceed.  Location of patient: At home  Location of provider: I am in my office  Persons participating in the televisit with the patient.   No one else on the call    History of Present Illness:   CC: Primary care follow-up visit  HPI Cheryl Prince presents for primary care follow-up at 07/26/20 Cheryl Prince presents for PCP to f/u    Former [facility] pt. The patient has been experiencing frequent chronic right shoulder and right hip pain for a few months. The right shoulder pain occurs throughout the shoulder area and joint. The pain is worse with movement and improves with keeping the right shoulder still. The patient takes Tylenol and gabapentin for the pain which is helpful. The patient has seen orthopedics in the past for this pain and was told she may need a right shoulder replacement in the future. She also states she saw physical therapy in the past for her shoulder which was helpful. The patient's right hip pain occurs mostly in the anterior and lateral aspect of the right hip. The pain improves with taking Tylenol and gabapentin. The pain is exacerbated by walking on the treadmill. She has seen physical therapy in the past for the right hip pain which was helpful. She would like  a referral for physical therapy today.   The patient has a history of hypertension which is well controlled on her current dose of HCTZ.   The patient has chronic bronchitis for which she takes Flovent while experiencing symptoms and albuterol as needed.   The patient has multiple seasonal and food allergies which cause itching. The patient takes hydroxyzine and Singulair for these symptoms.   The patient has daily gastroesophageal reflux symptoms for which she takes Pepcid and Compazine.    The patient's medical record reveals a diagnosis of bipolar 1 disorder, though the patient denies having this diagnosis. She admits to having PTSD from [employer].   12/12/2020 Since the last visit in June the patient has been doing better.  She is undergoing physical therapy and her right hip and left shoulder are improved.  She is joined the [employer] she is doing swimming and other exercises she has a home gym as well she has lost weight she is got an air [place] and trying to improve her diet.  She is requesting a new electric lift chair because she has difficulty rising from a sitting position.  She does have an upcoming DEXA scan and mammogram in December.  Patient has no other real complaints blood pressures been stable.  Past Medical History:  Diagnosis Date   Allergy    Anemia    Anxiety    Arthritis    Asthma    borderline bronchitis   Bronchitis    GERD (gastroesophageal reflux disease)    Hypertension  S/P tonsillectomy 05/30/2016   Sleep apnea    history of, MD tookpt. off machine 7 yrs, ago    Past Surgical History:  Procedure Laterality Date   ABDOMINAL HYSTERECTOMY     adnoides     BREAST SURGERY     CHOLECYSTECTOMY     COLONOSCOPY     DILATION AND CURETTAGE OF UTERUS     GASTRIC BYPASS  1998   IUD REMOVAL     TONSILLECTOMY     TOTAL KNEE ARTHROPLASTY Left 09/22/2018   TOTAL KNEE ARTHROPLASTY Left 09/22/2018   Procedure: LEFT TOTAL KNEE ARTHROPLASTY;  Surgeon: Cammy Copa, MD;  Location: [facility] OR;  Service: Orthopedics;  Laterality: Left;    Family History  Problem Relation Age of Onset   Hypertension Father    Diabetes Father    Lupus Daughter    Colon cancer Maternal Aunt    Colon cancer Maternal Uncle        4 mat uncles dx colon ca   Esophageal cancer Neg Hx    Rectal cancer Neg Hx    Stomach cancer Neg Hx     Social History   Socioeconomic History   Marital status: Single    Spouse name: Not on file   Number of children: Not on file   Years of education: Not on file   Highest education level: Not on file  Occupational History   Not on file  Tobacco Use   Smoking status: Former    Packs/day: 1.50    Years: 18.00    Pack years: 27.00    Types: Cigarettes    Quit date: 09/16/1984    Years since quitting: 36.2   Smokeless tobacco: Never   Tobacco comments:    quit 1986  Vaping Use   Vaping Use: Never used  Substance and Sexual Activity   Alcohol use: Yes    Comment: social   Drug use: No   Sexual activity: Not on file  Other Topics Concern   Not on file  Social History Narrative   Not on file   Social Determinants of Health   Financial Resource Strain: Not on file  Food Insecurity: Not on file  Transportation Needs: Not on file  Physical Activity: Not on file  Stress: Not on file  Social Connections: Not on file  Intimate Partner Violence: Not on file    Outpatient Medications Prior to Visit  Medication Sig Dispense Refill   acetaminophen (TYLENOL) 325 MG tablet Take 1 tablet (325 mg total) by mouth every 8 (eight) hours. 100 tablet 2   albuterol (PROAIR HFA) 108 (90 Base) MCG/ACT inhaler Inhale 2 puffs into the lungs every 6 (six) hours as needed for wheezing or shortness of breath. 8.5 g 2   famotidine (PEPCID) 20 MG tablet Take 1 tablet (20 mg total) by mouth 2 (two) times daily. 180 tablet 1   FLOVENT HFA 110 MCG/ACT inhaler Inhale 2 puffs into the lungs 2 (two) times daily as needed. 1 each 3    gabapentin (NEURONTIN) 300 MG capsule Take 1 capsule (300 mg total) by mouth 2 (two) times daily. 180 capsule 1   hydrochlorothiazide (HYDRODIURIL) 25 MG tablet Take 1 tablet (25 mg total) by mouth daily. 90 tablet 3   hydrOXYzine (ATARAX/VISTARIL) 10 MG tablet Take 1 tablet (10 mg total) by mouth daily as needed for itching or anxiety. 90 tablet 1   ibuprofen (ADVIL) 600 MG tablet Take 1 tablet (600 mg total) by mouth every  6 (six) hours as needed for moderate pain. 120 tablet 1   Misc. Devices MISC Lazy boy chair. Dx- right rotator cuff arthropathy 1 each 0   montelukast (SINGULAIR) 10 MG tablet Take 1 tablet (10 mg total) by mouth daily as needed (wheezing). 90 tablet 3   Multiple Vitamin (MULTIVITAMIN) tablet Take 1 tablet by mouth daily.     Omega-3 1000 MG CAPS Take 1 capsule by mouth daily before breakfast.     prochlorperazine (COMPAZINE) 5 MG tablet Take 1 tablet (5 mg total) by mouth every 6 (six) hours as needed. 360 tablet 0   No facility-administered medications prior to visit.    Allergies  Allergen Reactions   Morphine And Related Swelling   Penicillins Swelling   Pine Shortness Of Breath   Shellfish Allergy Shortness Of Breath   Miralax [Polyethylene Glycol] Itching   Other Itching and Nausea And Vomiting    Mushrooms - feels high, and dizzy    Wheat Bran     Rash, tingling in mouth, vomiting   Grapeseed Extract [Nutritional Supplements] Rash    grapes   Latex Itching and Rash    "IF" condom, it makes it painful   Red Dye Swelling and Rash    The dye for checking thyroid.  Red Cast Dye   Sulfites Rash    Tingling in mouth    ROS Review of Systems  Constitutional:  Negative for chills, diaphoresis and fever.  HENT:  Negative for congestion, hearing loss, nosebleeds, sore throat and tinnitus.   Eyes:  Negative for photophobia and redness.  Respiratory:  Negative for cough, shortness of breath, wheezing and stridor.   Cardiovascular:  Negative for chest pain,  palpitations and leg swelling.  Gastrointestinal:  Negative for abdominal pain, blood in stool, constipation, diarrhea, nausea and vomiting.  Endocrine: Negative for polydipsia.  Genitourinary:  Negative for dysuria, flank pain, frequency, hematuria and urgency.  Musculoskeletal:  Negative for back pain, myalgias and neck pain.  Skin:  Negative for rash.  Allergic/Immunologic: Negative for environmental allergies.  Neurological:  Negative for dizziness, tremors, seizures, weakness and headaches.  Hematological:  Does not bruise/bleed easily.  Psychiatric/Behavioral:  Negative for suicidal ideas. The patient is not nervous/anxious.      Objective:    Physical Exam Sam this is a video visit patient is in no distress on the video There were no vitals taken for this visit. Wt Readings from Last 3 Encounters:  07/26/20 209 lb (94.8 kg)  02/17/20 219 lb (99.3 kg)  07/04/19 207 lb (93.9 kg)     Health Maintenance Due  Topic Date Due   COVID-19 Vaccine (4 - Booster for Moderna series) 07/06/2020   MAMMOGRAM  07/27/2020    There are no preventive care reminders to display for this patient.  Lab Results  Component Value Date   TSH 1.210 12/11/2016   Lab Results  Component Value Date   WBC 3.2 (L) 02/17/2020   HGB 12.0 02/17/2020   HCT 38.5 02/17/2020   MCV 78 (L) 02/17/2020   PLT 238 02/17/2020   Lab Results  Component Value Date   NA 140 02/17/2020   K 3.8 02/17/2020   CO2 22 02/17/2020   GLUCOSE 77 02/17/2020   BUN 22 02/17/2020   CREATININE 0.84 02/17/2020   BILITOT 0.3 05/13/2019   ALKPHOS 143 (H) 05/13/2019   AST 32 05/13/2019   ALT 30 05/13/2019   PROT 7.1 05/13/2019   ALBUMIN 4.1 05/13/2019   CALCIUM 9.0 02/17/2020  ANIONGAP 10 09/17/2018   Lab Results  Component Value Date   CHOL 146 06/30/2018   Lab Results  Component Value Date   HDL 67 06/30/2018   Lab Results  Component Value Date   LDLCALC 65 06/30/2018   Lab Results  Component Value Date    TRIG 70 06/30/2018   Lab Results  Component Value Date   CHOLHDL 2.2 06/30/2018   Lab Results  Component Value Date   HGBA1C 5.8 (A) 12/23/2018      Assessment & Plan:   Problem List Items Addressed This Visit       Cardiovascular and Mediastinum   Hypertension    Blood pressure stable at this time no changes made we will maintain low-dose diuretic        Digestive   GERD (gastroesophageal reflux disease)     Musculoskeletal and Integument   Rotator cuff tendonitis, left - Primary    Rotator cuff tendinitis on the left improved at this time continue current therapy      Relevant Orders   For home use only DME Other see comment   Osteoarthritis of left knee   Relevant Orders   For home use only DME Other see comment     Other   Prediabetes    Continue following a healthy diet no medications needed      Right hip pain    Right hip pain improved with physical therapy      At risk for decreased bone density    Follow-up with bone DEXA scan      RESOLVED: Right shoulder pain   Relevant Orders   For home use only DME Other see comment    No orders of the defined types were placed in this encounter.   Follow-up: Return in about 3 months (around 03/14/2021).   Follow Up Instructions: Patient knows a new electric lift chair will be ordered refills on medications will be sent in and she will keep her upon follow-up appointments for bone DEXA scan and mammogram upcoming in December   I discussed the assessment and treatment plan with the patient. The patient was provided an opportunity to ask questions and all were answered. The patient agreed with the plan and demonstrated an understanding of the instructions.   The patient was advised to call back or seek an in-person evaluation if the symptoms worsen or if the condition fails to improve as anticipated.  I provided 35 minutes of non-face-to-face time during this encounter  including  median intraservice  time , review of notes, labs, imaging, medications  and explaining diagnosis and management to the patient .      Shan Levans, MD

## 2020-12-12 NOTE — Assessment & Plan Note (Signed)
Rotator cuff tendinitis on the left improved at this time continue current therapy

## 2020-12-12 NOTE — Assessment & Plan Note (Signed)
Follow-up with bone DEXA scan

## 2020-12-12 NOTE — Assessment & Plan Note (Signed)
Airway function improved plan continue inhaled medications

## 2020-12-12 NOTE — Assessment & Plan Note (Signed)
Continue following a healthy diet no medications needed

## 2020-12-12 NOTE — Assessment & Plan Note (Signed)
Blood pressure stable at this time no changes made we will maintain low-dose diuretic

## 2020-12-12 NOTE — Assessment & Plan Note (Signed)
Right hip pain improved with physical therapy

## 2020-12-14 ENCOUNTER — Telehealth: Payer: Self-pay

## 2020-12-14 NOTE — Telephone Encounter (Signed)
Order received for electric lift chair. Call placed to patient to inquire if she has a preference for supplier.  She prefers the order be sent to [facility] in [place]. She said she can get to [facility] easier than to [employer] in [place].  She knows that she will need to try the chair before purchasing it.  Explained to her that the order will be sent to [facility] and if they are not able to provide this for patient, this CM will contact patient and send to another supplier.  The patient stated that her insurance company will cover most of the cost of the chair except for about $400-500.   Order then faxed to [facility]

## 2020-12-28 ENCOUNTER — Telehealth: Payer: Self-pay

## 2020-12-28 NOTE — Telephone Encounter (Addendum)
Call placed to [facility] to inquire about status of order for lift chair.  Spoke to [place] who said that the referral was declined but she had no information why it was declined.  She also noted that they spoke to the patient and informed her.   Call placed to patient and she said no one from [facility] called her about the chair.  Informed her that this CM will contact [facility] again.   Call placed to [facility], spoke to [place] again and she said she would send message to the person working in the account to call this CM back with more information

## 2021-01-01 NOTE — Telephone Encounter (Signed)
Call placed to [facility] to check on status of referral for lift chair.  Spoke to [place] who could not find any additional information about the order. She said she would send a message to the rehab team asking that they reach out to the patient to explain insurance coverage and order status. She said that she is not sure why no one has reached out to the patient and no call was returned to this CM.

## 2021-01-01 NOTE — Telephone Encounter (Signed)
Call received from [facility] # [phone] ext [phone] regarding lift chair,  She said that [facility] does not carry those items any longer. She is not sure who else carries them and she is going to call the patient now and explain to her that the order has been declined.

## 2021-01-02 ENCOUNTER — Telehealth: Payer: Self-pay

## 2021-01-02 NOTE — Telephone Encounter (Signed)
Call placed to [facility] in [place].  They do carry power lift chairs however the insurance will only pay for the lift mechanism if they pay for anything at all.  They do not pay for the chair itself.  The cost for chairs at this store start at about $1500, which would be an out of pocket expense.   Call placed to patient and informed her of above. She noted that [facility] never called her.  This CM explained that [facility] does not carry power lift chairs any longer.  The patient was surprised at the price of the chairs and will look online for power chairs at a more reasonable price.

## 2021-01-09 ENCOUNTER — Other Ambulatory Visit: Payer: Self-pay | Admitting: Critical Care Medicine

## 2021-01-09 DIAGNOSIS — K219 Gastro-esophageal reflux disease without esophagitis: Secondary | ICD-10-CM

## 2021-01-09 NOTE — Telephone Encounter (Signed)
Requested Prescriptions  Pending Prescriptions Disp Refills  . famotidine (PEPCID) 20 MG tablet [Pharmacy Med Name: Famotidine 20 MG Oral Tablet] 180 tablet 3    Sig: TAKE 1 TABLET BY MOUTH  TWICE DAILY     Gastroenterology:  H2 Antagonists Passed - 01/09/2021  6:11 AM      Passed - Valid encounter within last 12 months    Recent Outpatient Visits          4 weeks ago Rotator cuff tendonitis, left   [facility] Storm Frisk, MD   5 months ago Essential hypertension   [facility] [facility] Storm Frisk, MD   10 months ago Encounter for [employer] annual wellness exam   [facility] [place], Valetta Mole, MD   1 year ago Rotator cuff arthropathy of right shoulder    [facility] Hoy Register, MD   1 year ago Essential hypertension   [facility] [place], Marzella Schlein, [place]      Future Appointments            In 2 months Delford Field Charlcie Cradle, MD [facility]

## 2021-02-01 ENCOUNTER — Other Ambulatory Visit: Payer: Medicare Other

## 2021-02-01 ENCOUNTER — Ambulatory Visit: Payer: Medicare Other

## 2021-02-17 ENCOUNTER — Other Ambulatory Visit: Payer: Self-pay | Admitting: Critical Care Medicine

## 2021-02-17 DIAGNOSIS — Z760 Encounter for issue of repeat prescription: Secondary | ICD-10-CM

## 2021-02-17 NOTE — Telephone Encounter (Signed)
Requested medication (s) are due for refill today: med expired  Requested medication (s) are on the active medication list: no  Last refill:  07/26/20   expired  01/22/21  Future visit scheduled: yes  Notes to clinic:  med expired    Requested Prescriptions  Pending Prescriptions Disp Refills   hydrOXYzine (ATARAX) 10 MG tablet [Pharmacy Med Name: HYDROXYZINE HYDROCHLORIDE  10MG   TAB] 90 tablet 1    Sig: TAKE 1 TABLET BY MOUTH  DAILY AS NEEDED FOR ITCHING OR ANXIETY     Ear, Nose, and Throat:  Antihistamines Passed - 02/17/2021 12:00 PM      Passed - Valid encounter within last 12 months    Recent Outpatient Visits           2 months ago Rotator cuff tendonitis, left   [facility] 02/19/2021, MD   6 months ago Essential hypertension   [facility] Storm Frisk, MD   1 year ago Encounter for Storm Frisk annual wellness exam   [facility] [place], [facility], MD   1 year ago Rotator cuff arthropathy of right shoulder   [place] [facility] Valetta Mole, MD   1 year ago Essential hypertension   [facility] [place], [place], Marzella Schlein       Future Appointments             In 1 month [place] Delford Field, MD [facility]

## 2021-03-18 NOTE — Progress Notes (Signed)
Established Patient Office Visit  Subjective:  Patient ID: Cheryl Prince, female    DOB: Jan 31, 1956  Age: 66 y.o. MRN: [id number]  CC:  Chief Complaint  Patient presents with   Hypertension    HPI Cheryl Prince presents for hypertension follow-up  07/26/20 [name] presents for PCP to f/u    Former [facility] pt. The patient has been experiencing frequent chronic right shoulder and right hip pain for a few months. The right shoulder pain occurs throughout the shoulder area and joint. The pain is worse with movement and improves with keeping the right shoulder still. The patient takes Tylenol and gabapentin for the pain which is helpful. The patient has seen orthopedics in the past for this pain and was told she may need a right shoulder replacement in the future. She also states she saw physical therapy in the past for her shoulder which was helpful. The patient's right hip pain occurs mostly in the anterior and lateral aspect of the right hip. The pain improves with taking Tylenol and gabapentin. The pain is exacerbated by walking on the treadmill. She has seen physical therapy in the past for the right hip pain which was helpful. She would like a referral for physical therapy today.   The patient has a history of hypertension which is well controlled on her current dose of HCTZ.   The patient has chronic bronchitis for which she takes Flovent while experiencing symptoms and albuterol as needed.   The patient has multiple seasonal and food allergies which cause itching. The patient takes hydroxyzine and Singulair for these symptoms.   The patient has daily gastroesophageal reflux symptoms for which she takes Pepcid and Compazine.    The patient's medical record reveals a diagnosis of bipolar 1 disorder, though the patient denies having this diagnosis. She admits to having PTSD from [employer].    12/12/2020 Since the last visit in June the patient has  been doing better.  She is undergoing physical therapy and her right hip and left shoulder are improved.  She is joined the [employer] she is doing swimming and other exercises she has a home gym as well she has lost weight she is got an air [place] and trying to improve her diet.  She is requesting a new electric lift chair because she has difficulty rising from a sitting position.  She does have an upcoming DEXA scan and mammogram in December.  Patient has no other real complaints blood pressures been stable.   03/19/21 Since the last visit in October the patient's undergone a great deal of stress and that she lost a cousin in a parent and short periods of time over 2 months.  On arrival blood pressure elevated 176/107.  She is drinking several vodkas a day as well.  Patient is gained some weight and is no longer exercising at the [facility] as she had previously.  She is on the hydrochlorothiazide alone 25 mg.  Her asthma is stable at this time on the Flovent and montelukast and as needed albuterol.  Patient declines to receive any vaccines as she has a prior history of hypersensitivity to all vaccines.  Her diet is also not been very compliant and she is eating a lot of carbohydrates and salt foods.  After resting for 10 minutes I rechecked her blood pressure and it was still elevated as previous.  Past Medical History:  Diagnosis Date   Allergy    Anemia    Anxiety  Arthritis    Asthma    borderline bronchitis   Bronchitis    GERD (gastroesophageal reflux disease)    Goiter, toxic diffuse 05/30/2016   Hypertension    S/P tonsillectomy 05/30/2016   Sleep apnea    history of, MD tookpt. off machine 7 yrs, ago    Past Surgical History:  Procedure Laterality Date   ABDOMINAL HYSTERECTOMY     adnoides     BREAST SURGERY     CHOLECYSTECTOMY     COLONOSCOPY     DILATION AND CURETTAGE OF UTERUS     GASTRIC BYPASS  1998   IUD REMOVAL     TONSILLECTOMY     TOTAL KNEE ARTHROPLASTY Left 09/22/2018    TOTAL KNEE ARTHROPLASTY Left 09/22/2018   Procedure: LEFT TOTAL KNEE ARTHROPLASTY;  Surgeon: Meredith Pel, MD;  Location: [place];  Service: Orthopedics;  Laterality: Left;    Family History  Problem Relation Age of Onset   Hypertension Father    Diabetes Father    Lupus Daughter    Colon cancer Maternal Aunt    Colon cancer Maternal Uncle        4 mat uncles dx colon ca   Esophageal cancer Neg Hx    Rectal cancer Neg Hx    Stomach cancer Neg Hx     Social History   Socioeconomic History   Marital status: Single    Spouse name: Not on file   Number of children: Not on file   Years of education: Not on file   Highest education level: Not on file  Occupational History   Not on file  Tobacco Use   Smoking status: Former    Packs/day: 1.50    Years: 18.00    Pack years: 27.00    Types: Cigarettes    Quit date: 09/16/1984    Years since quitting: 36.5   Smokeless tobacco: Never   Tobacco comments:    quit 1986  Vaping Use   Vaping Use: Never used  Substance and Sexual Activity   Alcohol use: Yes    Comment: social   Drug use: No   Sexual activity: Not on file  Other Topics Concern   Not on file  Social History Narrative   Not on file   Social Determinants of Health   Financial Resource Strain: Not on file  Food Insecurity: Not on file  Transportation Needs: Not on file  Physical Activity: Not on file  Stress: Not on file  Social Connections: Not on file  Intimate Partner Violence: Not on file    Outpatient Medications Prior to Visit  Medication Sig Dispense Refill   acetaminophen (TYLENOL) 325 MG tablet Take 1 tablet (325 mg total) by mouth every 8 (eight) hours. 100 tablet 2   albuterol (PROAIR HFA) 108 (90 Base) MCG/ACT inhaler Inhale 2 puffs into the lungs every 6 (six) hours as needed for wheezing or shortness of breath. 8.5 g 2   famotidine (PEPCID) 20 MG tablet TAKE 1 TABLET BY MOUTH  TWICE DAILY 180 tablet 3   hydrOXYzine (ATARAX) 10 MG tablet  TAKE 1 TABLET BY MOUTH  DAILY AS NEEDED FOR ITCHING OR ANXIETY 90 tablet 0   ibuprofen (ADVIL) 600 MG tablet Take 1 tablet (600 mg total) by mouth every 6 (six) hours as needed for moderate pain. 120 tablet 1   Misc. Devices MISC Lazy boy chair. Dx- right rotator cuff arthropathy 1 each 0   montelukast (SINGULAIR) 10 MG tablet Take 1  tablet (10 mg total) by mouth daily as needed (wheezing). 90 tablet 3   Multiple Vitamin (MULTIVITAMIN) tablet Take 1 tablet by mouth daily.     Omega-3 1000 MG CAPS Take 1 capsule by mouth daily before breakfast.     prochlorperazine (COMPAZINE) 5 MG tablet Take 1 tablet (5 mg total) by mouth every 6 (six) hours as needed. 360 tablet 0   hydrochlorothiazide (HYDRODIURIL) 25 MG tablet Take 1 tablet (25 mg total) by mouth daily. 90 tablet 3   FLOVENT HFA 110 MCG/ACT inhaler Inhale 2 puffs into the lungs 2 (two) times daily as needed. 1 each 3   gabapentin (NEURONTIN) 300 MG capsule Take 1 capsule (300 mg total) by mouth 2 (two) times daily. 180 capsule 1   No facility-administered medications prior to visit.    Allergies  Allergen Reactions   Morphine And Related Swelling   Penicillins Swelling   Pine Shortness Of Breath   Shellfish Allergy Shortness Of Breath   Miralax [Polyethylene Glycol] Itching   Nickel Other (See Comments)    Pt states she gets abscess from the earrings with nickel    Other Itching and Nausea And Vomiting    Mushrooms - feels high, and dizzy    Wheat Bran     Rash, tingling in mouth, vomiting   Grapeseed Extract [Nutritional Supplements] Rash    grapes   Latex Itching and Rash    "IF" condom, it makes it painful   Red Dye Swelling and Rash    The dye for checking thyroid.  Red Cast Dye   Sulfites Rash    Tingling in mouth    ROS Review of Systems  Constitutional: Negative.   HENT: Negative.  Negative for ear pain, postnasal drip, rhinorrhea, sinus pressure, sore throat, trouble swallowing and voice change.   Eyes: Negative.    Respiratory:  Positive for shortness of breath. Negative for apnea, cough, choking, chest tightness, wheezing and stridor.   Cardiovascular: Negative.  Negative for chest pain, palpitations and leg swelling.  Gastrointestinal:  Positive for abdominal pain. Negative for abdominal distention, nausea and vomiting.  Genitourinary: Negative.   Musculoskeletal: Negative.  Negative for arthralgias and myalgias.  Skin: Negative.  Negative for rash.  Allergic/Immunologic: Negative.  Negative for environmental allergies and food allergies.  Neurological:  Positive for dizziness, light-headedness and headaches. Negative for syncope and weakness.  Hematological: Negative.  Negative for adenopathy. Does not bruise/bleed easily.  Psychiatric/Behavioral:  Positive for dysphoric mood. Negative for agitation, sleep disturbance and suicidal ideas. The patient is not nervous/anxious.      Objective:    Physical Exam Vitals reviewed.  Constitutional:      Appearance: Normal appearance. She is well-developed. She is obese. She is not diaphoretic.  HENT:     Head: Normocephalic and atraumatic.     Nose: No nasal deformity, septal deviation, mucosal edema or rhinorrhea.     Right Sinus: No maxillary sinus tenderness or frontal sinus tenderness.     Left Sinus: No maxillary sinus tenderness or frontal sinus tenderness.     Mouth/Throat:     Pharynx: No oropharyngeal exudate.  Eyes:     General: No scleral icterus.    Conjunctiva/sclera: Conjunctivae normal.     Pupils: Pupils are equal, round, and reactive to light.  Neck:     Thyroid: No thyromegaly.     Vascular: No carotid bruit or JVD.     Trachea: Trachea normal. No tracheal tenderness or tracheal deviation.  Cardiovascular:  Rate and Rhythm: Normal rate and regular rhythm.     Chest Wall: PMI is not displaced.     Pulses: Normal pulses. No decreased pulses.     Heart sounds: Normal heart sounds, S1 normal and S2 normal. Heart sounds not  distant. No murmur heard. No systolic murmur is present.  No diastolic murmur is present.    No friction rub. No gallop. No S3 or S4 sounds.  Pulmonary:     Effort: Pulmonary effort is normal. No tachypnea, accessory muscle usage or respiratory distress.     Breath sounds: Normal breath sounds. No stridor. No decreased breath sounds, wheezing, rhonchi or rales.  Chest:     Chest wall: No tenderness.  Abdominal:     General: Bowel sounds are normal. There is no distension.     Palpations: Abdomen is soft. Abdomen is not rigid.     Tenderness: There is no abdominal tenderness. There is no guarding or rebound.  Musculoskeletal:        General: Normal range of motion.     Cervical back: Normal range of motion and neck supple. No edema, erythema or rigidity. No muscular tenderness. Normal range of motion.  Lymphadenopathy:     Head:     Right side of head: No submental or submandibular adenopathy.     Left side of head: No submental or submandibular adenopathy.     Cervical: No cervical adenopathy.  Skin:    General: Skin is warm and dry.     Coloration: Skin is not pale.     Findings: No rash.     Nails: There is no clubbing.  Neurological:     Mental Status: She is alert and oriented to person, place, and time.     Sensory: No sensory deficit.  Psychiatric:        Attention and Perception: Attention and perception normal.        Mood and Affect: Mood is depressed.        Speech: Speech normal.        Behavior: Behavior is cooperative.        Thought Content: Thought content normal. Thought content is not paranoid or delusional. Thought content does not include homicidal or suicidal ideation. Thought content does not include homicidal or suicidal plan.        Cognition and Memory: Cognition and memory normal.        Judgment: Judgment normal.    BP (!) 176/107    Pulse 76    Resp 16    Wt 225 lb 9.6 oz (102.3 kg)    SpO2 94%    BMI 39.96 kg/m  Wt Readings from Last 3 Encounters:   03/19/21 225 lb 9.6 oz (102.3 kg)  07/26/20 209 lb (94.8 kg)  02/17/20 219 lb (99.3 kg)     Health Maintenance Due  Topic Date Due   MAMMOGRAM  07/27/2020    There are no preventive care reminders to display for this patient.  Lab Results  Component Value Date   TSH 1.210 12/11/2016   Lab Results  Component Value Date   WBC 3.2 (L) 02/17/2020   HGB 12.0 02/17/2020   HCT 38.5 02/17/2020   MCV 78 (L) 02/17/2020   PLT 238 02/17/2020   Lab Results  Component Value Date   NA 140 02/17/2020   K 3.8 02/17/2020   CO2 22 02/17/2020   GLUCOSE 77 02/17/2020   BUN 22 02/17/2020   CREATININE 0.84 02/17/2020  BILITOT 0.3 05/13/2019   ALKPHOS 143 (H) 05/13/2019   AST 32 05/13/2019   ALT 30 05/13/2019   PROT 7.1 05/13/2019   ALBUMIN 4.1 05/13/2019   CALCIUM 9.0 02/17/2020   ANIONGAP 10 09/17/2018   Lab Results  Component Value Date   CHOL 146 06/30/2018   Lab Results  Component Value Date   HDL 67 06/30/2018   Lab Results  Component Value Date   LDLCALC 65 06/30/2018   Lab Results  Component Value Date   TRIG 70 06/30/2018   Lab Results  Component Value Date   CHOLHDL 2.2 06/30/2018   Lab Results  Component Value Date   HGBA1C 5.8 (A) 12/23/2018      Assessment & Plan:   Problem List Items Addressed This Visit       Cardiovascular and Mediastinum   Hypertension    Patient had previously been at goal on hydrochlorothiazide alone in October but now has elevated blood pressure due to stress and lack of compliance to diet  Plan is to refer patient to clinical dietitian for hypertension  Recommended the patient reduce her alcohol intake  Referral to clinical social worker will be made for counseling  Will discontinue hydrochlorothiazide and begin losartan HCT 100/25 daily  Patient to return in short-term follow-up 6 weeks for recheck      Relevant Medications   losartan-hydrochlorothiazide (HYZAAR) 100-25 MG tablet     Respiratory   Bronchitis     Stable on inhaled medications no change        Other   Prediabetes - Primary    Not on medications will refer to clinical dietitian      Relevant Orders   Amb ref to Medical Nutrition Therapy-MNT   Bipolar I disorder ([place])    Not currently on any therapy will refer to clinical social work      Other Visit Diagnoses     Class 2 severe obesity due to excess calories with serious comorbidity and body mass index (BMI) of 37.0 to 37.9 in adult [facility])       Relevant Orders   Amb ref to Medical Nutrition Therapy-MNT       Meds ordered this encounter  Medications   losartan-hydrochlorothiazide (HYZAAR) 100-25 MG tablet    Sig: Take 1 tablet by mouth daily.    Dispense:  60 tablet    Refill:  3    Follow-up: No follow-ups on file.    Asencion Noble, MD

## 2021-03-19 ENCOUNTER — Other Ambulatory Visit: Payer: Self-pay

## 2021-03-19 ENCOUNTER — Encounter: Payer: Self-pay | Admitting: Critical Care Medicine

## 2021-03-19 ENCOUNTER — Ambulatory Visit: Payer: Medicare Other | Attending: Critical Care Medicine | Admitting: Critical Care Medicine

## 2021-03-19 ENCOUNTER — Telehealth: Payer: Self-pay | Admitting: Critical Care Medicine

## 2021-03-19 VITALS — BP 176/107 | HR 76 | Resp 16 | Wt 225.6 lb

## 2021-03-19 DIAGNOSIS — J40 Bronchitis, not specified as acute or chronic: Secondary | ICD-10-CM

## 2021-03-19 DIAGNOSIS — I10 Essential (primary) hypertension: Secondary | ICD-10-CM

## 2021-03-19 DIAGNOSIS — R7303 Prediabetes: Secondary | ICD-10-CM

## 2021-03-19 DIAGNOSIS — Z6837 Body mass index (BMI) 37.0-37.9, adult: Secondary | ICD-10-CM

## 2021-03-19 DIAGNOSIS — F319 Bipolar disorder, unspecified: Secondary | ICD-10-CM

## 2021-03-19 MED ORDER — LOSARTAN POTASSIUM-HCTZ 100-25 MG PO TABS: 1.0000 | ORAL_TABLET | Freq: Every day | ORAL | 3 refills | Status: DC

## 2021-03-19 NOTE — Assessment & Plan Note (Signed)
Not currently on any therapy will refer to clinical social work

## 2021-03-19 NOTE — Telephone Encounter (Signed)
This patient has suffered loss, please connect this patient  also using alcohol.  Has mild depression Was dx bipolar in past , not on any  meds

## 2021-03-19 NOTE — Patient Instructions (Signed)
Referral to dietician made  Reduce alcohol use  Stop hydrochlorthiazide  Start losartan  HCT one daily sent to [facility]  You declined pneumonia vaccine  A mammogram was scheduled for June 21 2021  Return Dr Delford Field 6 weeks

## 2021-03-19 NOTE — Assessment & Plan Note (Signed)
Patient had previously been at goal on hydrochlorothiazide alone in October but now has elevated blood pressure due to stress and lack of compliance to diet  Plan is to refer patient to clinical dietitian for hypertension  Recommended the patient reduce her alcohol intake  Referral to clinical social worker will be made for counseling  Will discontinue hydrochlorothiazide and begin losartan HCT 100/25 daily  Patient to return in short-term follow-up 6 weeks for recheck

## 2021-03-19 NOTE — Assessment & Plan Note (Signed)
Stable on inhaled medications no change

## 2021-03-19 NOTE — Assessment & Plan Note (Signed)
Not on medications will refer to clinical dietitian

## 2021-03-20 ENCOUNTER — Other Ambulatory Visit: Payer: Self-pay | Admitting: Critical Care Medicine

## 2021-03-20 DIAGNOSIS — Z760 Encounter for issue of repeat prescription: Secondary | ICD-10-CM

## 2021-03-20 NOTE — Telephone Encounter (Signed)
Requested medications are due for refill today.  yes  Requested medications are on the active medications list.  yes  Last refill. 07/26/2020  Future visit scheduled.   no  Notes to clinic.  Rx expired 12/12/2020    Requested Prescriptions  Pending Prescriptions Disp Refills   gabapentin (NEURONTIN) 300 MG capsule [Pharmacy Med Name: Gabapentin 300 MG Oral Capsule] 180 capsule 3    Sig: TAKE 1 CAPSULE BY MOUTH  TWICE DAILY     Neurology: Anticonvulsants - gabapentin Passed - 03/20/2021  5:47 AM      Passed - Valid encounter within last 12 months    Recent Outpatient Visits           Yesterday Primary hypertension   [facility] Storm Frisk, MD   3 months ago Rotator cuff tendonitis, left   [facility] Storm Frisk, MD   7 months ago Essential hypertension   [facility] Storm Frisk, MD   1 year ago Encounter for [employer] annual wellness exam   [facility], Valetta Mole, MD   1 year ago Rotator cuff arthropathy of right shoulder   [facility] Hoy Register, MD

## 2021-03-23 ENCOUNTER — Other Ambulatory Visit: Payer: Self-pay

## 2021-03-23 ENCOUNTER — Ambulatory Visit (HOSPITAL_COMMUNITY)
Admission: EM | Admit: 2021-03-23 | Discharge: 2021-03-23 | Disposition: A | Discharge status: Home / Self Care | Payer: Medicare Other | Attending: Student | Admitting: Student

## 2021-03-23 ENCOUNTER — Encounter (HOSPITAL_COMMUNITY): Payer: Self-pay

## 2021-03-23 DIAGNOSIS — J4521 Mild intermittent asthma with (acute) exacerbation: Secondary | ICD-10-CM | POA: Diagnosis not present

## 2021-03-23 DIAGNOSIS — B349 Viral infection, unspecified: Secondary | ICD-10-CM

## 2021-03-23 DIAGNOSIS — Z880 Allergy status to penicillin: Secondary | ICD-10-CM | POA: Diagnosis not present

## 2021-03-23 DIAGNOSIS — Z87891 Personal history of nicotine dependence: Secondary | ICD-10-CM

## 2021-03-23 MED ORDER — DOXYCYCLINE HYCLATE 100 MG PO CAPS: 100.0000 mg | ORAL_CAPSULE | Freq: Two times a day (BID) | ORAL | 0 refills | Status: AC

## 2021-03-23 NOTE — ED Provider Notes (Signed)
[facility]    CSN: [id number] Arrival date & time: 03/23/21  0920      History   Chief Complaint Chief Complaint  Patient presents with   Shortness of Breath   Sore Throat    HPI Cheryl Prince is a 66 y.o. female presenting with cough and congestion.  Medical history asthma that is typically controlled on albuterol, former smoker. States symptoms x7 days. Started with sore throat, nasal congestion; this progressed into cough productive of yellow and white sputum that has lingered. Using the albuterol inhaler 2-3x daily with temporary relief. States she is "crackling" at night. States some headaches and swollen glands in neck. Has been using humidifier and hot shower with relief of the congestion. Also using OTC sinus medications.  HPI  Past Medical History:  Diagnosis Date   Allergy    Anemia    Anxiety    Arthritis    Asthma    borderline bronchitis   Bronchitis    GERD (gastroesophageal reflux disease)    Goiter, toxic diffuse 05/30/2016   Hypertension    S/P tonsillectomy 05/30/2016   Sleep apnea    history of, MD tookpt. off machine 7 yrs, ago    Patient Active Problem List   Diagnosis Date Noted   Bipolar I disorder ([place]) 07/26/2020   Right hip pain 07/26/2020   Bronchitis 07/26/2020   Allergies 07/26/2020   GERD (gastroesophageal reflux disease) 07/26/2020   At risk for decreased bone density 07/26/2020   PTSD (post-traumatic stress disorder) 05/30/2016   Alpha thalassemia trait 05/30/2016   Hypertension 05/30/2016   Rotator cuff tendonitis, left 05/30/2016   Osteoarthritis of left knee 05/30/2016   Carpal tunnel syndrome 05/30/2016   Prediabetes 05/30/2016   Onychomycosis 05/30/2016    Past Surgical History:  Procedure Laterality Date   ABDOMINAL HYSTERECTOMY     adnoides     BREAST SURGERY     CHOLECYSTECTOMY     COLONOSCOPY     DILATION AND CURETTAGE OF UTERUS     GASTRIC BYPASS  1998   IUD REMOVAL     TONSILLECTOMY      TOTAL KNEE ARTHROPLASTY Left 09/22/2018   TOTAL KNEE ARTHROPLASTY Left 09/22/2018   Procedure: LEFT TOTAL KNEE ARTHROPLASTY;  Surgeon: Meredith Pel, MD;  Location: [facility];  Service: Orthopedics;  Laterality: Left;    OB History   No obstetric history on file.      Home Medications    Prior to Admission medications   Medication Sig Start Date End Date Taking? Authorizing Provider  doxycycline (VIBRAMYCIN) 100 MG capsule Take 1 capsule (100 mg total) by mouth 2 (two) times daily for 7 days. 03/23/21 03/30/21 Yes Hazel Sams, PA-C  gabapentin (NEURONTIN) 300 MG capsule TAKE 1 CAPSULE BY MOUTH  TWICE DAILY 03/22/21   Elsie Stain, MD  acetaminophen (TYLENOL) 325 MG tablet Take 1 tablet (325 mg total) by mouth every 8 (eight) hours. 05/13/19   Argentina Donovan, PA-C  albuterol (PROAIR HFA) 108 (90 Base) MCG/ACT inhaler Inhale 2 puffs into the lungs every 6 (six) hours as needed for wheezing or shortness of breath. 07/26/20   Elsie Stain, MD  famotidine (PEPCID) 20 MG tablet TAKE 1 TABLET BY MOUTH  TWICE DAILY 01/09/21   Elsie Stain, MD  FLOVENT HFA 110 MCG/ACT inhaler Inhale 2 puffs into the lungs 2 (two) times daily as needed. 07/26/20 12/12/20  Elsie Stain, MD  hydrOXYzine (ATARAX) 10 MG tablet TAKE  1 TABLET BY MOUTH  DAILY AS NEEDED FOR ITCHING OR ANXIETY 02/20/21   Elsie Stain, MD  ibuprofen (ADVIL) 600 MG tablet Take 1 tablet (600 mg total) by mouth every 6 (six) hours as needed for moderate pain. 07/26/20   Elsie Stain, MD  losartan-hydrochlorothiazide (HYZAAR) 100-25 MG tablet Take 1 tablet by mouth daily. 03/19/21   Elsie Stain, MD  Misc. Devices MISC Lazy boy chair. Dx- right rotator cuff arthropathy 06/09/19   Charlott Rakes, MD  montelukast (SINGULAIR) 10 MG tablet Take 1 tablet (10 mg total) by mouth daily as needed (wheezing). 07/26/20   Elsie Stain, MD  Multiple Vitamin (MULTIVITAMIN) tablet Take 1 tablet by mouth daily.    [provider]  Omega-3 1000 MG CAPS Take 1 capsule by mouth daily before breakfast.    [provider]  prochlorperazine (COMPAZINE) 5 MG tablet Take 1 tablet (5 mg total) by mouth every 6 (six) hours as needed. 07/26/20   Elsie Stain, MD    Family History Family History  Problem Relation Age of Onset   Hypertension Father    Diabetes Father    Lupus Daughter    Colon cancer Maternal Aunt    Colon cancer Maternal Uncle        4 mat uncles dx colon ca   Esophageal cancer Neg Hx    Rectal cancer Neg Hx    Stomach cancer Neg Hx     Social History Social History   Tobacco Use   Smoking status: Former    Packs/day: 1.50    Years: 18.00    Pack years: 27.00    Types: Cigarettes    Quit date: 09/16/1984    Years since quitting: 36.5   Smokeless tobacco: Never   Tobacco comments:    quit 1986  Vaping Use   Vaping Use: Never used  Substance Use Topics   Alcohol use: Yes    Comment: social   Drug use: No     Allergies   Morphine and related, Penicillins, Pine, Shellfish allergy, Miralax [polyethylene glycol], Nickel, Other, Wheat bran, Grapeseed extract [nutritional supplements], Latex, Red dye, and Sulfites   Review of Systems Review of Systems  Constitutional:  Negative for appetite change, chills and fever.  HENT:  Positive for congestion. Negative for ear pain, rhinorrhea, sinus pressure, sinus pain and sore throat.   Eyes:  Negative for redness and visual disturbance.  Respiratory:  Positive for cough. Negative for chest tightness, shortness of breath and wheezing.   Cardiovascular:  Negative for chest pain and palpitations.  Gastrointestinal:  Negative for abdominal pain, constipation, diarrhea, nausea and vomiting.  Genitourinary:  Negative for dysuria, frequency and urgency.  Musculoskeletal:  Negative for myalgias.  Neurological:  Negative for dizziness, weakness and headaches.  Psychiatric/Behavioral:  Negative for confusion.   All other systems  reviewed and are negative.   Physical Exam Triage Vital Signs ED Triage Vitals  Enc Vitals Group     BP      Pulse      Resp      Temp      Temp src      SpO2      Weight      Height      Head Circumference      Peak Flow      Pain Score      Pain Loc      Pain Edu?      Excl. in [place]?  No data found.  Updated Vital Signs BP (!) 151/98 (BP Location: Right Arm)    Pulse 90    Temp 98.2 F (36.8 C) (Oral)    Resp 17    SpO2 98%   Visual Acuity Right Eye Distance:   Left Eye Distance:   Bilateral Distance:    Right Eye Near:   Left Eye Near:    Bilateral Near:     Physical Exam Vitals reviewed.  Constitutional:      General: She is not in acute distress.    Appearance: Normal appearance. She is not ill-appearing.  HENT:     Head: Normocephalic and atraumatic.     Right Ear: Tympanic membrane, ear canal and external ear normal. No tenderness. No middle ear effusion. There is no impacted cerumen. Tympanic membrane is not perforated, erythematous, retracted or bulging.     Left Ear: Tympanic membrane, ear canal and external ear normal. No tenderness.  No middle ear effusion. There is no impacted cerumen. Tympanic membrane is not perforated, erythematous, retracted or bulging.     Nose: No congestion.     Right Sinus: Maxillary sinus tenderness present. No frontal sinus tenderness.     Left Sinus: No maxillary sinus tenderness or frontal sinus tenderness.     Mouth/Throat:     Mouth: Mucous membranes are moist.     Pharynx: Uvula midline. No oropharyngeal exudate or posterior oropharyngeal erythema.     Comments: Tonsils not visible Eyes:     Extraocular Movements: Extraocular movements intact.     Pupils: Pupils are equal, round, and reactive to light.  Cardiovascular:     Rate and Rhythm: Normal rate and regular rhythm.     Heart sounds: Normal heart sounds.  Pulmonary:     Effort: Pulmonary effort is normal.     Breath sounds: Normal breath sounds. No  decreased breath sounds, wheezing, rhonchi or rales.  Abdominal:     Palpations: Abdomen is soft.     Tenderness: There is no abdominal tenderness. There is no guarding or rebound.  Lymphadenopathy:     Cervical: Cervical adenopathy present.     Right cervical: Superficial cervical adenopathy present.     Left cervical: Superficial cervical adenopathy present.  Neurological:     General: No focal deficit present.     Mental Status: She is alert and oriented to person, place, and time.  Psychiatric:        Mood and Affect: Mood normal.        Behavior: Behavior normal.        Thought Content: Thought content normal.        Judgment: Judgment normal.     UC Treatments / Results  Labs (all labs ordered are listed, but only abnormal results are displayed) Labs Reviewed - No data to display  EKG   Radiology No results found.  Procedures Procedures (including critical care time)  Medications Ordered in UC Medications - No data to display  Initial Impression / Assessment and Plan / UC Course  I have reviewed the triage vital signs and the nursing notes.  Pertinent labs & imaging results that were available during my care of the patient were reviewed by me and considered in my medical decision making (see chart for details).     This patient is a very pleasant 66 y.o. year old female presenting with asthma exacerbation. Afebrile, nontachy, clear lung sounds throughout - but she took two puffs of albuterol 5 minutes before auscultation.  History asthma and former smoker, no formal diagnosis of COPD. Symptoms consistent with COPD or asthma exacerbation - cough with increasing sputum and shortness of breath. I also have concern for early sinusitis given pressure below eyes and symptoms x7 days. Low concern for PNA given clear lung sounds, normal vitals.   She is penicillin allergic. Doxycycline sent. Continue albuterol. Start flovent, which she already has at home. She declines PO  prednisone in favor of flovent, which I am in agreement with.  ED return precautions discussed. Patient verbalizes understanding and agreement.   Coding Level 4 for acute exacerbation of chronic condition, and prescription drug management   Final Clinical Impressions(s) / UC Diagnoses   Final diagnoses:  Mild intermittent asthma with acute exacerbation  Viral syndrome  Penicillin allergy  Former smoker     Discharge Instructions      -Doxycycline twice daily for 7 days.  Make sure to wear sunscreen while spending time outside while on this medication as it can increase your chance of sunburn. You can take this medication with food if you have a sensitive stomach. -Albuterol inhaler as needed for cough, wheezing, shortness of breath, 1 to 2 puffs every 6 hours as needed. -Start the flovent inhaler, two puffs twice daily -Follow-up if symptoms worsen instead of improve - shortness of breath, new fevers, the color of your mucous changes, etc.      ED Prescriptions     Medication Sig Dispense Auth. Provider   doxycycline (VIBRAMYCIN) 100 MG capsule Take 1 capsule (100 mg total) by mouth 2 (two) times daily for 7 days. 14 capsule Hazel Sams, PA-C      PDMP not reviewed this encounter.   Hazel Sams, PA-C 03/23/21 1026

## 2021-03-23 NOTE — Discharge Instructions (Addendum)
-  Doxycycline twice daily for 7 days.  Make sure to wear sunscreen while spending time outside while on this medication as it can increase your chance of sunburn. You can take this medication with food if you have a sensitive stomach. -Albuterol inhaler as needed for cough, wheezing, shortness of breath, 1 to 2 puffs every 6 hours as needed. -Start the flovent inhaler, two puffs twice daily -Follow-up if symptoms worsen instead of improve - shortness of breath, new fevers, the color of your mucous changes, etc.

## 2021-03-23 NOTE — ED Triage Notes (Signed)
Pt presents with c/o sore throat, SOB and chest congestion.   States she feels mucus in her throat and chest.

## 2021-04-10 ENCOUNTER — Other Ambulatory Visit: Payer: Self-pay | Admitting: Critical Care Medicine

## 2021-04-10 DIAGNOSIS — Z9189 Other specified personal risk factors, not elsewhere classified: Secondary | ICD-10-CM

## 2021-04-10 DIAGNOSIS — Z1231 Encounter for screening mammogram for malignant neoplasm of breast: Secondary | ICD-10-CM

## 2021-04-13 NOTE — Telephone Encounter (Signed)
Pt returned call and stated that she does not need counseling. She stated that she also does not want any medications. She says she is "alright now". I encouraged pt to call back if she feels like she needs support.

## 2021-05-02 ENCOUNTER — Ambulatory Visit: Payer: Medicare Other | Attending: Critical Care Medicine | Admitting: Critical Care Medicine

## 2021-05-02 ENCOUNTER — Other Ambulatory Visit: Payer: Self-pay

## 2021-05-02 ENCOUNTER — Encounter: Payer: Self-pay | Admitting: Critical Care Medicine

## 2021-05-02 DIAGNOSIS — Z9189 Other specified personal risk factors, not elsewhere classified: Secondary | ICD-10-CM

## 2021-05-02 DIAGNOSIS — M1712 Unilateral primary osteoarthritis, left knee: Secondary | ICD-10-CM

## 2021-05-02 DIAGNOSIS — Z760 Encounter for issue of repeat prescription: Secondary | ICD-10-CM

## 2021-05-02 DIAGNOSIS — J40 Bronchitis, not specified as acute or chronic: Secondary | ICD-10-CM

## 2021-05-02 DIAGNOSIS — F319 Bipolar disorder, unspecified: Secondary | ICD-10-CM

## 2021-05-02 DIAGNOSIS — K219 Gastro-esophageal reflux disease without esophagitis: Secondary | ICD-10-CM | POA: Diagnosis not present

## 2021-05-02 DIAGNOSIS — I10 Essential (primary) hypertension: Secondary | ICD-10-CM | POA: Diagnosis not present

## 2021-05-02 MED ORDER — GABAPENTIN 300 MG PO CAPS: 300.0000 mg | ORAL_CAPSULE | Freq: Two times a day (BID) | ORAL | 3 refills | Status: DC

## 2021-05-02 MED ORDER — LOSARTAN POTASSIUM-HCTZ 100-25 MG PO TABS: 1.0000 | ORAL_TABLET | Freq: Every day | ORAL | 3 refills | Status: DC

## 2021-05-02 MED ORDER — ALBUTEROL SULFATE HFA 108 (90 BASE) MCG/ACT IN AERS
2.0000 | INHALATION_SPRAY | Freq: Four times a day (QID) | RESPIRATORY_TRACT | 2 refills | Status: DC | PRN
Start: 2021-05-02 — End: 2021-10-02

## 2021-05-02 NOTE — Patient Instructions (Addendum)
Refills sent on medications ? ?Keep bone density and mammogram appts ? ?Follow lifestyle medicine handout diet suggestions ? ?Knee exercises ? ?Return Dr Delford Field 5 months ?

## 2021-05-02 NOTE — Assessment & Plan Note (Signed)
Blood pressure not yet at goal she has lower numbers at home ? ?Plan to continue losartan HCT as prescribed I went over with the patient a lifestyle medicine program for her to improve her diet.  She is eating a lot of processed foods that are high in salt content.  We will see how she performs on this if there is not change at the next visit will need to increase medication. ?

## 2021-05-02 NOTE — Assessment & Plan Note (Signed)
Patient has a bone density study upcoming she will complete this ?

## 2021-05-02 NOTE — Assessment & Plan Note (Signed)
Patient to continue Pepcid twice daily ?

## 2021-05-02 NOTE — Assessment & Plan Note (Signed)
Patient instructed to continue Flovent and albuterol ?

## 2021-05-02 NOTE — Progress Notes (Signed)
Established Patient Office Visit  Subjective:  Patient ID: Cheryl Prince, female    DOB: 08/05/1955  Age: 66 y.o. MRN: [id number]  CC: PCP f/u knee pain   HPI  Cheryl Prince presents for hypertension follow-up   07/26/20 Cheryl Prince presents for PCP to f/u    Former [facility] pt. The patient has been experiencing frequent chronic right shoulder and right hip pain for a few months. The right shoulder pain occurs throughout the shoulder area and joint. The pain is worse with movement and improves with keeping the right shoulder still. The patient takes Tylenol and gabapentin for the pain which is helpful. The patient has seen orthopedics in the past for this pain and was told she may need a right shoulder replacement in the future. She also states she saw physical therapy in the past for her shoulder which was helpful. The patient's right hip pain occurs mostly in the anterior and lateral aspect of the right hip. The pain improves with taking Tylenol and gabapentin. The pain is exacerbated by walking on the treadmill. She has seen physical therapy in the past for the right hip pain which was helpful. She would like a referral for physical therapy today.   The patient has a history of hypertension which is well controlled on her current dose of HCTZ.   The patient has chronic bronchitis for which she takes Flovent while experiencing symptoms and albuterol as needed.   The patient has multiple seasonal and food allergies which cause itching. The patient takes hydroxyzine and Singulair for these symptoms.   The patient has daily gastroesophageal reflux symptoms for which she takes Pepcid and Compazine.    The patient's medical record reveals a diagnosis of bipolar 1 disorder, though the patient denies having this diagnosis. She admits to having PTSD from [employer].    12/12/2020 Since the last visit in June the patient has been doing better.  She is undergoing  physical therapy and her right hip and left shoulder are improved.  She is joined the [employer] she is doing swimming and other exercises she has a home gym as well she has lost weight she is got an air Rives and trying to improve her diet.  She is requesting a new electric lift chair because she has difficulty rising from a sitting position.  She does have an upcoming DEXA scan and mammogram in December.  Patient has no other real complaints blood pressures been stable.   03/19/21 Since the last visit in October the patient's undergone a great deal of stress and that she lost a cousin in a parent and short periods of time over 2 months.  On arrival blood pressure elevated 176/107.  She is drinking several vodkas a day as well.  Patient is gained some weight and is no longer exercising at the [facility] as she had previously.  She is on the hydrochlorothiazide alone 25 mg.  Her asthma is stable at this time on the Flovent and montelukast and as needed albuterol.  Patient declines to receive any vaccines as she has a prior history of hypersensitivity to all vaccines.   Her diet is also not been very compliant and she is eating a lot of carbohydrates and salt foods.  After resting for 10 minutes I rechecked her blood pressure and it was still elevated as previous.   05/02/2021 Patient is seen in return follow-up she does have some chronic right knee pain.  The patient's overall mood  and stress levels have improved.  She is about to travel to [place] to see her brother for his birthday.  She is trying to eat a healthier diet.  She knows she has a mammogram that is coming up in July of this year.  On arrival blood pressure 138/80.  The patient's been careful to watch her sugar and salt intake but does need improvement in this regard.  Patient is compliant with her blood pressure medications.  She remains on the losartan HCT 100/25.  Patient does need refills on her medications.  She does use the Singulair as  needed hydroxyzine as needed Flovent is being used along with Pepcid.  Gabapentin is twice daily as well. Mammo 09/17/21     Hypertension      Patient had previously been at goal on hydrochlorothiazide alone in October but now has elevated blood pressure due to stress and lack of compliance to diet   Plan is to refer patient to clinical dietitian for hypertension   Recommended the patient reduce her alcohol intake   Referral to clinical social worker will be made for counseling   Will discontinue hydrochlorothiazide and begin losartan HCT 100/25 daily   Patient to return in short-term follow-up 6 weeks for recheck        Relevant Medications    losartan-hydrochlorothiazide (HYZAAR) 100-25 MG tablet        Respiratory    Bronchitis      Stable on inhaled medications no change            Other    Prediabetes - Primary      Not on medications will refer to clinical dietitian        Relevant Orders    Amb ref to Medical Nutrition Therapy-MNT    Bipolar I disorder (HCC)      Not currently on any therapy will refer to clinical social work        Other Visit Diagnoses       Class 2 severe obesity due to excess calories with serious comorbidity and body mass index (BMI) of 37.0 to 37.9 in adult [facility])        Relevant Orders    Amb ref to Medical Nutrition Therapy-MNT               Meds ordered this encounter  Medications   losartan-hydrochlorothiazide (HYZAAR) 100-25 MG tablet      Sig: Take 1 tablet by mouth daily.      Dispense:  60 tablet      Refill:  3    Past Medical History:  Diagnosis Date   Allergy    Anemia    Anxiety    Arthritis    Asthma    borderline bronchitis   Bronchitis    GERD (gastroesophageal reflux disease)    Goiter, toxic diffuse 05/30/2016   Hypertension    S/P tonsillectomy 05/30/2016   Sleep apnea    history of, MD tookpt. off machine 7 yrs, ago    Past Surgical History:  Procedure Laterality Date   ABDOMINAL HYSTERECTOMY      adnoides     BREAST SURGERY     CHOLECYSTECTOMY     COLONOSCOPY     DILATION AND CURETTAGE OF UTERUS     GASTRIC BYPASS  1998   IUD REMOVAL     TONSILLECTOMY     TOTAL KNEE ARTHROPLASTY Left 09/22/2018   TOTAL KNEE ARTHROPLASTY Left 09/22/2018   Procedure: LEFT TOTAL KNEE  ARTHROPLASTY;  Surgeon: Cammy Copa, MD;  Location: [facility] OR;  Service: Orthopedics;  Laterality: Left;    Family History  Problem Relation Age of Onset   Hypertension Father    Diabetes Father    Lupus Daughter    Colon cancer Maternal Aunt    Colon cancer Maternal Uncle        4 mat uncles dx colon ca   Esophageal cancer Neg Hx    Rectal cancer Neg Hx    Stomach cancer Neg Hx     Social History   Socioeconomic History   Marital status: Single    Spouse name: Not on file   Number of children: Not on file   Years of education: Not on file   Highest education level: Not on file  Occupational History   Not on file  Tobacco Use   Smoking status: Former    Packs/day: 1.50    Years: 18.00    Pack years: 27.00    Types: Cigarettes    Quit date: 09/16/1984    Years since quitting: 36.6   Smokeless tobacco: Never   Tobacco comments:    quit 1986  Vaping Use   Vaping Use: Never used  Substance and Sexual Activity   Alcohol use: Yes    Comment: social   Drug use: No   Sexual activity: Not on file  Other Topics Concern   Not on file  Social History Narrative   Not on file   Social Determinants of Health   Financial Resource Strain: Not on file  Food Insecurity: Not on file  Transportation Needs: Not on file  Physical Activity: Not on file  Stress: Not on file  Social Connections: Not on file  Intimate Partner Violence: Not on file    Outpatient Medications Prior to Visit  Medication Sig Dispense Refill   acetaminophen (TYLENOL) 325 MG tablet Take 1 tablet (325 mg total) by mouth every 8 (eight) hours. 100 tablet 2   famotidine (PEPCID) 20 MG tablet TAKE 1 TABLET BY MOUTH  TWICE DAILY  180 tablet 3   FLOVENT HFA 110 MCG/ACT inhaler Inhale 2 puffs into the lungs 2 (two) times daily as needed. 1 each 3   hydrOXYzine (ATARAX) 10 MG tablet TAKE 1 TABLET BY MOUTH  DAILY AS NEEDED FOR ITCHING OR ANXIETY 90 tablet 0   ibuprofen (ADVIL) 600 MG tablet Take 1 tablet (600 mg total) by mouth every 6 (six) hours as needed for moderate pain. 120 tablet 1   Misc. Devices MISC Lazy boy chair. Dx- right rotator cuff arthropathy 1 each 0   montelukast (SINGULAIR) 10 MG tablet Take 1 tablet (10 mg total) by mouth daily as needed (wheezing). 90 tablet 3   Multiple Vitamin (MULTIVITAMIN) tablet Take 1 tablet by mouth daily.     Omega-3 1000 MG CAPS Take 1 capsule by mouth daily before breakfast.     albuterol (PROAIR HFA) 108 (90 Base) MCG/ACT inhaler Inhale 2 puffs into the lungs every 6 (six) hours as needed for wheezing or shortness of breath. 8.5 g 2   gabapentin (NEURONTIN) 300 MG capsule TAKE 1 CAPSULE BY MOUTH  TWICE DAILY 180 capsule 3   losartan-hydrochlorothiazide (HYZAAR) 100-25 MG tablet Take 1 tablet by mouth daily. 60 tablet 3   prochlorperazine (COMPAZINE) 5 MG tablet Take 1 tablet (5 mg total) by mouth every 6 (six) hours as needed. 360 tablet 0   No facility-administered medications prior to visit.    Allergies  Allergen Reactions   Morphine And Related Swelling   Penicillins Swelling   Pine Shortness Of Breath   Shellfish Allergy Shortness Of Breath   Miralax [Polyethylene Glycol] Itching   Nickel Other (See Comments)    Pt states she gets abscess from the earrings with nickel    Other Itching and Nausea And Vomiting    Mushrooms - feels high, and dizzy    Wheat Bran     Rash, tingling in mouth, vomiting   Grapeseed Extract [Nutritional Supplements] Rash    grapes   Latex Itching and Rash    "IF" condom, it makes it painful   Red Dye Swelling and Rash    The dye for checking thyroid.  Red Cast Dye   Sulfites Rash    Tingling in mouth    ROS Review of Systems   Constitutional: Negative.   HENT: Negative.  Negative for ear pain, postnasal drip, rhinorrhea, sinus pressure, sore throat, trouble swallowing and voice change.   Eyes: Negative.   Respiratory: Negative.  Negative for apnea, cough, choking, chest tightness, shortness of breath, wheezing and stridor.   Cardiovascular: Negative.  Negative for chest pain, palpitations and leg swelling.  Gastrointestinal: Negative.  Negative for abdominal distention, abdominal pain, nausea and vomiting.  Genitourinary: Negative.   Musculoskeletal:  Negative for arthralgias and myalgias.       Right knee pain  Skin: Negative.  Negative for rash.  Allergic/Immunologic: Negative.  Negative for environmental allergies and food allergies.  Neurological: Negative.  Negative for dizziness, syncope, weakness and headaches.  Hematological: Negative.  Negative for adenopathy. Does not bruise/bleed easily.  Psychiatric/Behavioral:  Positive for dysphoric mood. Negative for agitation, self-injury, sleep disturbance and suicidal ideas. The patient is not nervous/anxious.      Objective:    Physical Exam Vitals reviewed.  Constitutional:      Appearance: Normal appearance. She is well-developed. She is obese. She is not diaphoretic.  HENT:     Head: Normocephalic and atraumatic.     Right Ear: Tympanic membrane, ear canal and external ear normal. There is no impacted cerumen.     Left Ear: Tympanic membrane, ear canal and external ear normal. There is no impacted cerumen.     Nose: Nose normal. No nasal deformity, septal deviation, mucosal edema or rhinorrhea.     Right Sinus: No maxillary sinus tenderness or frontal sinus tenderness.     Left Sinus: No maxillary sinus tenderness or frontal sinus tenderness.     Mouth/Throat:     Mouth: Mucous membranes are moist.     Pharynx: Oropharynx is clear. No oropharyngeal exudate.  Eyes:     General: No scleral icterus.    Conjunctiva/sclera: Conjunctivae normal.      Pupils: Pupils are equal, round, and reactive to light.  Neck:     Thyroid: No thyromegaly.     Vascular: No carotid bruit or JVD.     Trachea: Trachea normal. No tracheal tenderness or tracheal deviation.  Cardiovascular:     Rate and Rhythm: Normal rate and regular rhythm.     Chest Wall: PMI is not displaced.     Pulses: Normal pulses. No decreased pulses.     Heart sounds: Normal heart sounds, S1 normal and S2 normal. Heart sounds not distant. No murmur heard. No systolic murmur is present.  No diastolic murmur is present.    No friction rub. No gallop. No S3 or S4 sounds.  Pulmonary:     Effort: No tachypnea, accessory  muscle usage or respiratory distress.     Breath sounds: No stridor. No decreased breath sounds, wheezing, rhonchi or rales.  Chest:     Chest wall: No tenderness.  Abdominal:     General: Bowel sounds are normal. There is no distension.     Palpations: Abdomen is soft. Abdomen is not rigid.     Tenderness: There is no abdominal tenderness. There is no guarding or rebound.  Musculoskeletal:        General: Normal range of motion.     Cervical back: Normal range of motion and neck supple. No edema, erythema or rigidity. No muscular tenderness. Normal range of motion.  Lymphadenopathy:     Head:     Right side of head: No submental or submandibular adenopathy.     Left side of head: No submental or submandibular adenopathy.     Cervical: No cervical adenopathy.  Skin:    General: Skin is warm and dry.     Coloration: Skin is not pale.     Findings: No rash.     Nails: There is no clubbing.  Neurological:     Mental Status: She is alert and oriented to person, place, and time.     Sensory: No sensory deficit.  Psychiatric:        Mood and Affect: Mood normal.        Speech: Speech normal.        Behavior: Behavior normal.        Thought Content: Thought content normal.        Judgment: Judgment normal.    BP (!) 163/92    Pulse 67    Resp 16    Wt 225 lb  (102.1 kg)    SpO2 100%    BMI 39.86 kg/m  Wt Readings from Last 3 Encounters:  05/02/21 225 lb (102.1 kg)  03/19/21 225 lb 9.6 oz (102.3 kg)  07/26/20 209 lb (94.8 kg)     Health Maintenance Due  Topic Date Due   MAMMOGRAM  07/27/2020    There are no preventive care reminders to display for this patient.  Lab Results  Component Value Date   TSH 1.210 12/11/2016   Lab Results  Component Value Date   WBC 3.2 (L) 02/17/2020   HGB 12.0 02/17/2020   HCT 38.5 02/17/2020   MCV 78 (L) 02/17/2020   PLT 238 02/17/2020   Lab Results  Component Value Date   NA 140 02/17/2020   K 3.8 02/17/2020   CO2 22 02/17/2020   GLUCOSE 77 02/17/2020   BUN 22 02/17/2020   CREATININE 0.84 02/17/2020   BILITOT 0.3 05/13/2019   ALKPHOS 143 (H) 05/13/2019   AST 32 05/13/2019   ALT 30 05/13/2019   PROT 7.1 05/13/2019   ALBUMIN 4.1 05/13/2019   CALCIUM 9.0 02/17/2020   ANIONGAP 10 09/17/2018   Lab Results  Component Value Date   CHOL 146 06/30/2018   Lab Results  Component Value Date   HDL 67 06/30/2018   Lab Results  Component Value Date   LDLCALC 65 06/30/2018   Lab Results  Component Value Date   TRIG 70 06/30/2018   Lab Results  Component Value Date   CHOLHDL 2.2 06/30/2018   Lab Results  Component Value Date   HGBA1C 5.8 (A) 12/23/2018      Assessment & Plan:   Problem List Items Addressed This Visit       Cardiovascular and Mediastinum   Hypertension  Blood pressure not yet at goal she has lower numbers at home  Plan to continue losartan HCT as prescribed I went over with the patient a lifestyle medicine program for her to improve her diet.  She is eating a lot of processed foods that are high in salt content.  We will see how she performs on this if there is not change at the next visit will need to increase medication.      Relevant Medications   losartan-hydrochlorothiazide (HYZAAR) 100-25 MG tablet     Respiratory   Bronchitis    Patient instructed  to continue Flovent and albuterol        Digestive   GERD (gastroesophageal reflux disease)    Patient to continue Pepcid twice daily        Musculoskeletal and Integument   Osteoarthritis of left knee    Patient given knee exercises        Other   Bipolar I disorder (HCC)    Not on current medication she declined to receive counseling      At risk for decreased bone density    Patient has a bone density study upcoming she will complete this      Other Visit Diagnoses     Medication refill       Relevant Medications   gabapentin (NEURONTIN) 300 MG capsule       Meds ordered this encounter  Medications   losartan-hydrochlorothiazide (HYZAAR) 100-25 MG tablet    Sig: Take 1 tablet by mouth daily.    Dispense:  90 tablet    Refill:  3   gabapentin (NEURONTIN) 300 MG capsule    Sig: Take 1 capsule (300 mg total) by mouth 2 (two) times daily.    Dispense:  180 capsule    Refill:  3    Requesting 1 year supply   albuterol (PROAIR HFA) 108 (90 Base) MCG/ACT inhaler    Sig: Inhale 2 puffs into the lungs every 6 (six) hours as needed for wheezing or shortness of breath.    Dispense:  8.5 g    Refill:  2    Follow-up: Return in about 5 months (around 10/02/2021).    Shan LevansPatrick Vessie Olmsted, MD

## 2021-05-02 NOTE — Assessment & Plan Note (Signed)
Not on current medication she declined to receive counseling ?

## 2021-05-02 NOTE — Assessment & Plan Note (Signed)
Patient given knee exercises ?

## 2021-05-06 ENCOUNTER — Telehealth: Payer: Self-pay | Admitting: Critical Care Medicine

## 2021-05-06 NOTE — Telephone Encounter (Signed)
Call pt, tell her I recommend she consider receiving a pneumonia vaccine Prevnar 20 due to her lung condition and age.  It is a one and done vaccine and no major side effects except mild sore arm ? ?If she agrees schedule a nurse visit to give vaccine ?

## 2021-05-08 NOTE — Telephone Encounter (Signed)
Called pt and left vm  

## 2021-06-04 ENCOUNTER — Other Ambulatory Visit: Payer: Self-pay | Admitting: Critical Care Medicine

## 2021-06-04 DIAGNOSIS — Z760 Encounter for issue of repeat prescription: Secondary | ICD-10-CM

## 2021-06-04 NOTE — Telephone Encounter (Signed)
Medication Refill - Medication: hydrOXYzine (ATARAX) 10 MG tablet ? ?Has the patient contacted their pharmacy? Yes.   ?[facility] calling to follow up on request for this medication.  She said they faxed in 05/28/2021 ? ?Preferred Pharmacy (with phone number or street name): [facility] - [place], [place] - [address] [place] ?Has the patient been seen for an appointment in the last year OR does the patient have an upcoming appointment? Yes.   ? ?Agent: Please be advised that RX refills may take up to 3 business days. We ask that you follow-up with your pharmacy. ? ?

## 2021-06-05 NOTE — Telephone Encounter (Signed)
Requested medications are due for refill today.  yes ? ?Requested medications are on the active medications list.  yes ? ?Last refill. 02/20/2021 #90 0 refills ? ?Future visit scheduled.   yes ? ?Notes to clinic.  Medication failed protocol d/t expired labs. ? ? ? ?Requested Prescriptions  ?Pending Prescriptions Disp Refills  ? hydrOXYzine (ATARAX) 10 MG tablet 90 tablet 0  ?  ? Ear, Nose, and Throat:  Antihistamines 2 Failed - 06/04/2021 11:58 AM  ?  ?  Failed - Cr in normal range and within 360 days  ?  Creatinine, Ser  ?Date Value Ref Range Status  ?02/17/2020 0.84 0.57 - 1.00 mg/dL Final  ?  ?  ?  ?  Passed - Valid encounter within last 12 months  ?  Recent Outpatient Visits   ? ?      ? 1 month ago Medication refill  ? [facility] Storm Frisk, MD  ? 2 months ago Primary hypertension  ? [facility] Storm Frisk, MD  ? 5 months ago Rotator cuff tendonitis, left  ? [facility] Storm Frisk, MD  ? 10 months ago Essential hypertension  ? [facility] Storm Frisk, MD  ? 1 year ago Encounter for Medicare annual wellness exam  ? [facility] Swords, Valetta Mole, MD  ? ?  ?  ?Future Appointments   ? ?        ? In 3 months Delford Field Charlcie Cradle, MD [facility]  ? ?  ? ?  ?  ?  ?  ?

## 2021-06-06 MED ORDER — HYDROXYZINE HCL 10 MG PO TABS: ORAL_TABLET | ORAL | 1 refills | Status: DC

## 2021-06-21 ENCOUNTER — Ambulatory Visit: Payer: Medicare Other

## 2021-06-21 ENCOUNTER — Other Ambulatory Visit: Payer: Medicare Other

## 2021-06-26 ENCOUNTER — Telehealth: Payer: Self-pay | Admitting: Critical Care Medicine

## 2021-06-26 NOTE — Telephone Encounter (Signed)
Copied from CRM [id number]. Topic: General - Other ?>> Jun 26, 2021  2:06 PM Gaetana Michaelis A wrote: ?Reason for CRM: The patient has called to share that an electric recliner can be found at  ? ?[facility]  ?[address],  ?[place], [place] [address] ?Phone: [phone] ? ?The patient shares that this has been an ongoing process and they would like to know from the practice what steps they need to take for the recliner to be covered by their insurance provider  ? ?Please contact further when possible ?

## 2021-06-26 NOTE — Telephone Encounter (Signed)
I do not recall ever providing any note or even filling out any FMLA forms for this patient I do not see where a request for this is ever been obtained ? ?She will need an office visit and this can be simply a telephone virtual visit please add her on this week for that so we can go over what her request are and be clear on it ?

## 2021-06-26 NOTE — Telephone Encounter (Signed)
Routing to pcp

## 2021-06-26 NOTE — Telephone Encounter (Signed)
Copied from CRM [id number]. Topic: General - Other ?>> Jun 26, 2021 11:58 AM Gaetana Michaelis A wrote: ?Reason for CRM: The patient has been directed by their employer to request a new copy of their letter stating their work restrictions  ? ?The patient needs the note to state that they're allowed to work a maximum of 18 hours per week ? ?Please contact the patient further when possible ?

## 2021-06-27 NOTE — Telephone Encounter (Signed)
Can you help with this?

## 2021-06-27 NOTE — Telephone Encounter (Signed)
Called pt and appt made 

## 2021-06-28 ENCOUNTER — Ambulatory Visit: Payer: Medicare Other | Attending: Critical Care Medicine | Admitting: Critical Care Medicine

## 2021-06-28 ENCOUNTER — Encounter: Payer: Self-pay | Admitting: Critical Care Medicine

## 2021-06-28 DIAGNOSIS — M7582 Other shoulder lesions, left shoulder: Secondary | ICD-10-CM

## 2021-06-28 DIAGNOSIS — M1712 Unilateral primary osteoarthritis, left knee: Secondary | ICD-10-CM | POA: Diagnosis not present

## 2021-06-28 NOTE — Telephone Encounter (Signed)
Call returned to patient. She said she has an appointment at [facility] on Sunday, 5/7 to find out more information - do they bill the insurance company? Does insurance pay for this ?  She said she will call me back when she has more information.  ?

## 2021-06-28 NOTE — Assessment & Plan Note (Signed)
Severe osteoarthritis of the left knee again preventing long periods of standing at the workplace. ? ?This patient is not able to work more than 3 days a week 6 hours a day on her current schedule based on her orthopedic limitations letter was issued to her employer ?

## 2021-06-28 NOTE — Assessment & Plan Note (Signed)
Chronic pain left shoulder preventing lungs periods of standing at the workplace ?

## 2021-06-28 NOTE — Progress Notes (Signed)
? ?Established Patient Office Visit ? ?Subjective   ?Patient ID: Cheryl Prince, female    DOB: 04-25-1955  Age: 66 y.o. MRN: [id number] ? ?Virtual Visit via Telephone Note ? ?I connected with Cheryl Prince on 06/28/21 at 10:30 AM EDT by telephone and verified that I am speaking with the correct person using two identifiers. ?  ?Consent:  ?I discussed the limitations, risks, security and privacy concerns of performing an evaluation and management service by telephone and the availability of in person appointments. I also discussed with the patient that there may be a patient responsible charge related to this service. The patient expressed understanding and agreed to proceed. ? ?Location of patient: Patient's at home ? ?Location of provider: I am in the office ? ?Persons participating in the televisit with the patient.   ?No one else on the call  ? ?History of present illness ?07/26/20 ?Cheryl Prince presents for PCP to f/u    Former [facility] pt. The patient has been experiencing frequent chronic right shoulder and right hip pain for a few months. The right shoulder pain occurs throughout the shoulder area and joint. The pain is worse with movement and improves with keeping the right shoulder still. The patient takes Tylenol and gabapentin for the pain which is helpful. The patient has seen orthopedics in the past for this pain and was told she may need a right shoulder replacement in the future. She also states she saw physical therapy in the past for her shoulder which was helpful. The patient's right hip pain occurs mostly in the anterior and lateral aspect of the right hip. The pain improves with taking Tylenol and gabapentin. The pain is exacerbated by walking on the treadmill. She has seen physical therapy in the past for the right hip pain which was helpful. She would like a referral for physical therapy today. ?  ?The patient has a history of hypertension which is well controlled on her  current dose of HCTZ. ?  ?The patient has chronic bronchitis for which she takes Flovent while experiencing symptoms and albuterol as needed. ?  ?The patient has multiple seasonal and food allergies which cause itching. The patient takes hydroxyzine and Singulair for these symptoms. ?  ?The patient has daily gastroesophageal reflux symptoms for which she takes Pepcid and Compazine.  ?  ?The patient's medical record reveals a diagnosis of bipolar 1 disorder, though the patient denies having this diagnosis. She admits to having PTSD from [employer].  ?  ?12/12/2020 ?Since the last visit in June the patient has been doing better.  She is undergoing physical therapy and her right hip and left shoulder are improved.  She is joined the [employer] she is doing swimming and other exercises she has a home gym as well she has lost weight she is got an air [place] and trying to improve her diet.  She is requesting a new electric lift chair because she has difficulty rising from a sitting position. ? ?She does have an upcoming DEXA scan and mammogram in December.  Patient has no other real complaints blood pressures been stable. ?  ?03/19/21 ?Since the last visit in October the patient's undergone a great deal of stress and that she lost a cousin in a parent and short periods of time over 2 months.  On arrival blood pressure elevated 176/107.  She is drinking several vodkas a day as well.  Patient is gained some weight and is no longer exercising at the  [employer] as she had previously.  She is on the hydrochlorothiazide alone 25 mg.  Her asthma is stable at this time on the Flovent and montelukast and as needed albuterol.  Patient declines to receive any vaccines as she has a prior history of hypersensitivity to all vaccines. ?  ?Her diet is also not been very compliant and she is eating a lot of carbohydrates and salt foods.  After resting for 10 minutes I rechecked her blood pressure and it was still elevated as previous. ?   ?05/02/2021 ?Patient is seen in return follow-up she does have some chronic right knee pain.  The patient's overall mood and stress levels have improved.  She is about to travel to [place] to see her brother for his birthday.  She is trying to eat a healthier diet.  She knows she has a mammogram that is coming up in July of this year.  On arrival blood pressure 138/80. ?  ?The patient's been careful to watch her sugar and salt intake but does need improvement in this regard. ?  ?Patient is compliant with her blood pressure medications.  She remains on the losartan HCT 100/25. ?  ?Patient does need refills on her medications.  She does use the Singulair as needed hydroxyzine as needed Flovent is being used along with Pepcid.  Gabapentin is twice daily as well. ?Mammo 09/17/21 ?  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  She has been working 6 hours a day 3 days a week but no new management wanted to work 5 days a week 6 hours each.  She has significant arthritis in the right knee neck and shoulder.  Because of this she can only work the 3-day week 6-hour day schedule.  She is  needing a letter stating as such. ? ? ?Patient Active Problem List  ? Diagnosis Date Noted  ? Bipolar I disorder (HCC) 07/26/2020  ? Right hip pain 07/26/2020  ? Bronchitis 07/26/2020  ? Allergies 07/26/2020  ? GERD (gastroesophageal reflux disease) 07/26/2020  ? At risk for decreased bone density 07/26/2020  ? PTSD (post-traumatic stress disorder) 05/30/2016  ? Alpha thalassemia trait 05/30/2016  ? Hypertension 05/30/2016  ? Rotator cuff tendonitis, left 05/30/2016  ? Osteoarthritis of left knee 05/30/2016  ? Carpal tunnel syndrome 05/30/2016  ?  Prediabetes 05/30/2016  ? Onychomycosis 05/30/2016  ? ?Past Medical History:  ?Diagnosis Date  ? Allergy   ? Anemia   ? Anxiety   ? Arthritis   ? Asthma   ? borderline bronchitis  ? Bronchitis   ? GERD (gastroesophageal reflux disease)   ? Goiter, toxic diffuse 05/30/2016  ? Hypertension   ? S/P tonsillectomy 05/30/2016  ? Sleep apnea   ? history of, MD tookpt. off machine 7 yrs, ago  ? ?Past Surgical History:  ?Procedure Laterality Date  ? ABDOMINAL HYSTERECTOMY    ? adnoides    ? BREAST SURGERY    ? CHOLECYSTECTOMY    ? COLONOSCOPY    ? DILATION AND CURETTAGE OF UTERUS    ? GASTRIC BYPASS  1998  ? IUD REMOVAL    ? TONSILLECTOMY    ? TOTAL KNEE ARTHROPLASTY Left 09/22/2018  ?  TOTAL KNEE ARTHROPLASTY Left 09/22/2018  ? Procedure: LEFT TOTAL KNEE ARTHROPLASTY;  Surgeon: Cammy Copaean, Gregory Scott, MD;  Location: [facility] OR;  Service: Orthopedics;  Laterality: Left;  ? ?Social History  ? ?Tobacco Use  ? Smoking status: Former  ?  Packs/day: 1.50  ?  Years: 18.00  ?  Pack years: 27.00  ?  Types: Cigarettes  ?  Quit date: 09/16/1984  ?  Years since quitting: 36.8  ? Smokeless tobacco: Never  ? Tobacco comments:  ?  quit 1986  ?Vaping Use  ? Vaping Use: Never used  ?Substance Use Topics  ? Alcohol use: Yes  ?  Comment: social  ? Drug use: No  ? ?Social History  ? ?Socioeconomic History  ? Marital status: Single  ?  Spouse name: Not on file  ? Number of children: Not on file  ? Years of education: Not on file  ? Highest education level: Not on file  ?Occupational History  ? Not on file  ?Tobacco Use  ? Smoking status: Former  ?  Packs/day: 1.50  ?  Years: 18.00  ?  Pack years: 27.00  ?  Types: Cigarettes  ?  Quit date: 09/16/1984  ?  Years since quitting: 36.8  ? Smokeless tobacco: Never  ? Tobacco comments:  ?  quit 1986  ?Vaping Use  ? Vaping Use: Never used  ?Substance and Sexual Activity  ? Alcohol use: Yes  ?  Comment: social  ? Drug use: No  ? Sexual activity: Not on file  ?Other Topics Concern  ? Not on file  ?Social History  Narrative  ? Not on file  ? ?Social Determinants of Health  ? ?Financial Resource Strain: Not on file  ?Food Insecurity: Not on file  ?Transportation Needs: Not on file  ?Physical Activity: Not on file  ?Stress: Not on fi

## 2021-07-18 ENCOUNTER — Telehealth: Payer: Self-pay | Admitting: Critical Care Medicine

## 2021-07-18 NOTE — Telephone Encounter (Signed)
Routing to pcp for review 

## 2021-07-18 NOTE — Telephone Encounter (Signed)
This needs to go to practice management : Cheryl Prince

## 2021-07-18 NOTE — Telephone Encounter (Signed)
Copied from CRM [id number]. Topic: General - Other >> Jul 18, 2021  2:01 PM Herby Abraham C wrote: Reason for CRM: received call from [facility] calling in on behalf of the pt. She says that Dr. Delford Field is not credentialed with them. She suggest that provider send in documentation to get credentialed.   [phone] phone #  She didn't provide a fax #

## 2021-07-19 ENCOUNTER — Telehealth: Payer: Self-pay | Admitting: Critical Care Medicine

## 2021-07-19 NOTE — Telephone Encounter (Signed)
Copied from CRM [id number]. Topic: General - Other >> Jul 19, 2021  1:54 PM Glean Salen wrote: [phone].Reason for CRM:pt called in to give phone # and fax for her elyptical wheelchair at [facility]. Phone#[phone] and fax is

## 2021-07-19 NOTE — Telephone Encounter (Signed)
Do you know about this?

## 2021-07-19 NOTE — Telephone Encounter (Signed)
Noted  

## 2021-07-20 ENCOUNTER — Telehealth: Payer: Self-pay | Admitting: Critical Care Medicine

## 2021-07-20 DIAGNOSIS — M1712 Unilateral primary osteoarthritis, left knee: Secondary | ICD-10-CM

## 2021-07-20 DIAGNOSIS — M25551 Pain in right hip: Secondary | ICD-10-CM

## 2021-07-20 NOTE — Telephone Encounter (Signed)
Pt is calling regarding the electric lift chair for [name]. Please advise [phone]

## 2021-07-20 NOTE — Telephone Encounter (Signed)
Patient is calling to check status.

## 2021-07-24 NOTE — Telephone Encounter (Signed)
I called patient about this message. She said she still would like an electric lift chair and hopes to have it before she has her second knee replacement.  She explained that [facility] # [phone] has the chairs and her insurance will cover this 100%. I explained that [facility] [employer] is now [facility] and we contacted them in last fall and they did not have any lift chairs. I told her that I would call to inquire if anything has changed.    I called [facility] at the number the patient provided and spoke to [facility] in customer service.  She said that [facility] has been phasing out lift chairs and she could see the documentation in their computer system that indicates the referral for the lift chair was declined in the fall. Britta Mccreedy then contacted [employer] and [place] told her that they do not have Insurance claims handler.   Britta Mccreedy then called their [employer] and was informed that they have received 3 or 4 lift chairs and an order can be faxed to # [phone]. Britta Mccreedy also said that insurance companies have not covered the cost of the chair, sometimes they may cover the motor mechanism. This information about insurance coverage was shared with the patient last fall.   An order can be faxed to [facility] if Dr Delford Field is in agreement.

## 2021-07-24 NOTE — Telephone Encounter (Signed)
This is addressed in another note from today. I have spoken to the patient

## 2021-07-25 NOTE — Telephone Encounter (Signed)
I called patient and explained the outcome of my conversation with [facility] regarding the electric lift chair.  I also explained that [facility] is part of [facility]. She was in agreement to sending the order to [facility] but is doubtful that the insurance will approve it.   She said she will call her insurance company and inquire if the order can be sent directly to them for reimbursement of the cost of the chair. She explained that when she spoke to [facility], they told her that she would have to pay out of pocket for the chair and would need request payment for reimbursement from her insurance company.   Order for electric lift chair faxed to [facility]

## 2021-07-25 NOTE — Telephone Encounter (Signed)
Electric lift chair order placed it is on Writer

## 2021-07-26 NOTE — Telephone Encounter (Signed)
Fax received from [facility] stating that they do not provide motorized lift chairs

## 2021-07-26 NOTE — Telephone Encounter (Signed)
Pts insurance company along w/ pt called in to advise a PA is needed for the lift chair  Rep requesting a call to [phone] to initiate the PA

## 2021-07-26 NOTE — Telephone Encounter (Signed)
I called [employer] at the number patient provided and spoke to Tanya/ Provider Advocate.  She said she needs a code for the chair to determine if a prior Berkley Harvey is required before she can provide any information about submitting an order.  The patient dropped off a printout of the chair she would like from [employer]  I called the patient and explained to her that [facility] could not provide me any information about submitting an order for prior auth.  She said she would call her insurance company and obtain more information about the prior auth and try to identify who would be the most appropriate person at [facility] to speak to about submitting this request.

## 2021-08-11 ENCOUNTER — Other Ambulatory Visit: Payer: Self-pay | Admitting: Critical Care Medicine

## 2021-08-13 NOTE — Telephone Encounter (Signed)
Requested Prescriptions  Pending Prescriptions Disp Refills  . montelukast (SINGULAIR) 10 MG tablet [Pharmacy Med Name: Montelukast Sodium 10 MG Oral Tablet] 100 tablet 3    Sig: TAKE 1 TABLET BY MOUTH  DAILY AS NEEDED FOR  WHEEZING     Pulmonology:  Leukotriene Inhibitors Passed - 08/11/2021 11:51 AM      Passed - Valid encounter within last 12 months    Recent Outpatient Visits          1 month ago Primary osteoarthritis of left knee   [facility] Storm Frisk, MD   3 months ago Medication refill   [facility] Storm Frisk, [facility]   4 months ago Primary hypertension   [facility] Storm Frisk, MD   8 months ago Rotator cuff tendonitis, left   [facility] Storm Frisk, MD   1 year ago Essential hypertension   [facility] Storm Frisk, MD      Future Appointments            In 2 weeks August Saucer, Corrie Mckusick, MD [facility]   In 1 month Storm Frisk, MD [facility]

## 2021-08-27 ENCOUNTER — Encounter: Payer: Self-pay | Admitting: Orthopedic Surgery

## 2021-08-27 ENCOUNTER — Ambulatory Visit (INDEPENDENT_AMBULATORY_CARE_PROVIDER_SITE_OTHER): Payer: Medicare Other

## 2021-08-27 ENCOUNTER — Ambulatory Visit: Payer: Medicare Other | Admitting: Surgical

## 2021-08-27 VITALS — Ht 61.5 in | Wt 225.2 lb

## 2021-08-27 DIAGNOSIS — M25561 Pain in right knee: Secondary | ICD-10-CM

## 2021-08-27 DIAGNOSIS — M1711 Unilateral primary osteoarthritis, right knee: Secondary | ICD-10-CM | POA: Diagnosis not present

## 2021-08-27 NOTE — Progress Notes (Cosign Needed Addendum)
Office Visit Note   Patient: Tiawanna Luchsinger           Date of Birth: [birth year]           MRN: [id number] Visit Date: 08/27/2021 Requested by: Storm Frisk, MD [address] [place],  [place] [address] PCP: Storm Frisk, MD  Subjective: Chief Complaint  Patient presents with   Right Knee - Pain    HPI: Garima Chronis is a 66 y.o. female who presents to the office complaining of right knee pain.  Patient complains of several years of knee pain.  She has history of right knee arthritis.  Has had left total knee replacement done several years ago by Dr. Lawrence Marseilles ago by Dr. August Saucer.  Has several years ago by Dr. August Saucer.  Has done well from this.  Notes stiffness, weakness, locking symptoms in the right knee.  Takes Tylenol daily, uses ibuprofen, is ibuprofen, uses a brace as ibuprofen, uses a brace and ice wakes with pain most days.  Bowls 1-2 times per week and Alyq.  Leaving for cruise in a few days.  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  Does have a history of anemia..  No history of personal DVT/PE but does have a history of DVT multiple times on her mom side.  No diabetes, smoking, blood thinner use, hypertension..                ROS: All systems reviewed are negative as they relate to the chief complaint within the history of present illness.  Patient denies fevers or chills.  Assessment & Plan: Visit Diagnoses:  1. Right knee pain, unspecified chronicity   2. Unilateral primary osteoarthritis, right knee     Plan: Patient is a 66 year old female who presents for evaluation of right knee pain.  Has history of right knee osteoarthritis.  She would like to proceed with right total knee arthroplasty in October.  She has had left total knee arthroplasty done several years ago and has done well from this.  No personal history of DVT/PE but her mom has had multiple DVTs.  She went to a skilled nursing facility last time.   Discussed the risk and benefits of the procedure including the risk of nerve/blood vessel damage, knee stiffness, instability, medical complication from surgery such as DVT/PE, etc.,, prosthetic joint infection, need for revision surgery, incomplete resolution of symptoms.  After lengthy discussion, patient would like to proceed with surgery.  Plan to post her for knee replacement in October. Xarelto for post op dvt prophylaxis.  Follow-up after procedure.  Follow-Up Instructions: No follow-ups on file.   Orders:  Orders Placed This Encounter  Procedures   XR KNEE 3 VIEW RIGHT   No orders of the defined types were placed in this encounter.     Procedures: No procedures performed   Clinical Data: No additional findings.  Objective: Vital Signs: Ht 5' 1.5" (1.562 m)   Wt 225 lb 3.2 oz (102.2 kg)   BMI 41.86 kg/m   Physical Exam:  Constitutional: Patient appears well-developed HEENT:  Head: Normocephalic Eyes:EOM are normal Neck: Normal range of motion Cardiovascular: Normal rate Pulmonary/chest: Effort normal Neurologic: Patient is alert Skin: Skin is warm Psychiatric: Patient has normal mood and affect  Ortho Exam: Ortho exam demonstrates right knee with 5 degrees extension 5 degrees extension and 110 degrees of knee flexion.  No calf tenderness.  Negative Homans' sign.  No  pain with hip range of motion.  Able to perform straight leg with straight leg raise without extensor lag.  No.  No significant effusion noted.  Tenderness over the medial and lateral joint lines.  Palpable DP pulse rated 2+.  Specialty Comments:  No specialty comments available.  Imaging: No results found.   PMFS History: Patient Active Problem List   Diagnosis Date Noted   Bipolar I disorder (HCC) 07/26/2020   Right hip pain 07/26/2020   Bronchitis 07/26/2020   Allergies 07/26/2020   GERD (gastroesophageal reflux disease) 07/26/2020   At risk for decreased bone density 07/26/2020   PTSD  (post-traumatic stress disorder) 05/30/2016   Alpha thalassemia trait 05/30/2016   Hypertension 05/30/2016   Rotator cuff tendonitis, left 05/30/2016   Osteoarthritis of left knee 05/30/2016   Carpal tunnel syndrome 05/30/2016   Prediabetes 05/30/2016   Onychomycosis 05/30/2016   Past Medical History:  Diagnosis Date   Allergy    Anemia    Anxiety    Arthritis    Asthma    borderline bronchitis   Bronchitis    GERD (gastroesophageal reflux disease)    Goiter, toxic diffuse 05/30/2016   Hypertension    S/P tonsillectomy 05/30/2016   Sleep apnea    history of, MD tookpt. off machine 7 yrs, ago    Family History  Problem Relation Age of Onset   Hypertension Father    Diabetes Father    Lupus Daughter    Colon cancer Maternal Aunt    Colon cancer Maternal Uncle        4 mat uncles dx colon ca   Esophageal cancer Neg Hx    Rectal cancer Neg Hx    Stomach cancer Neg Hx     Past Surgical History:  Procedure Laterality Date   ABDOMINAL HYSTERECTOMY     adnoides     BREAST SURGERY     CHOLECYSTECTOMY     COLONOSCOPY     DILATION AND CURETTAGE OF UTERUS     GASTRIC BYPASS  1998   IUD REMOVAL     TONSILLECTOMY     TOTAL KNEE ARTHROPLASTY Left 09/22/2018   TOTAL KNEE ARTHROPLASTY Left 09/22/2018   Procedure: LEFT TOTAL KNEE ARTHROPLASTY;  Surgeon: Cammy Copa, MD;  Location: [facility] OR;  Service: Orthopedics;  Laterality: Left;   Social History   Occupational History   Not on file  Tobacco Use   Smoking status: Former    Packs/day: 1.50    Years: 18.00    Total pack years: 27.00    Types: Cigarettes    Quit date: 09/16/1984    Years since quitting: 36.9   Smokeless tobacco: Never   Tobacco comments:    quit 1986  Vaping Use   Vaping Use: Never used  Substance and Sexual Activity   Alcohol use: Yes    Comment: social   Drug use: No   Sexual activity: Not on file

## 2021-09-14 ENCOUNTER — Telehealth: Payer: Self-pay | Admitting: Orthopedic Surgery

## 2021-09-14 NOTE — Telephone Encounter (Signed)
Patient called and stated she would like to proceed with scheduling RIGHT TOTAL KNEE REPLACEMENT with Dr. August Saucer.  She has asked we hold 11-29-21 for her.  Please provide surgery sheet if surgery is in orders.  Please indicate if any clearances will need to be obtained prior to surgery.

## 2021-09-17 ENCOUNTER — Ambulatory Visit
Admission: RE | Admit: 2021-09-17 | Discharge: 2021-09-17 | Disposition: A | Discharge status: Home / Self Care | Payer: Medicare Other | Source: Ambulatory Visit | Attending: Critical Care Medicine | Admitting: Critical Care Medicine

## 2021-09-17 ENCOUNTER — Other Ambulatory Visit: Payer: Self-pay | Admitting: Critical Care Medicine

## 2021-09-17 DIAGNOSIS — Z9189 Other specified personal risk factors, not elsewhere classified: Secondary | ICD-10-CM

## 2021-09-17 DIAGNOSIS — Z1231 Encounter for screening mammogram for malignant neoplasm of breast: Secondary | ICD-10-CM

## 2021-09-17 MED ORDER — ALENDRONATE SODIUM 70 MG PO TABS: 70.0000 mg | ORAL_TABLET | ORAL | 11 refills | Status: DC

## 2021-09-17 NOTE — Progress Notes (Signed)
Let pt know she has osteoporosis  I am ordering fosamax to take weekly sent to [facility] mail order and she should take Vit D 1000 units daily along with a calcium pill /tablet daily over the counter

## 2021-09-18 ENCOUNTER — Telehealth: Payer: Self-pay

## 2021-09-18 NOTE — Telephone Encounter (Signed)
Pt was called and vm was left, Information has been sent to nurse pool.   

## 2021-09-18 NOTE — Telephone Encounter (Signed)
-----   Message from Storm Frisk, MD sent at 09/18/2021  9:56 AM EDT ----- Let pt know mammogram normal recheck one year

## 2021-09-18 NOTE — Telephone Encounter (Signed)
-----   Message from Storm Frisk, MD sent at 09/17/2021 12:17 PM EDT ----- Let pt know she has osteoporosis  I am ordering fosamax to take weekly sent to [facility] mail order and she should take Vit D 1000 units daily along with a calcium pill /tablet daily over the counter

## 2021-09-18 NOTE — Progress Notes (Signed)
Let pt know mammogram normal recheck one year

## 2021-10-01 ENCOUNTER — Other Ambulatory Visit: Payer: Self-pay | Admitting: Pharmacist

## 2021-10-01 NOTE — Chronic Care Management (AMB) (Signed)
Patient appearing on report for [facility] - Hypertension Control report due to last documented ambulatory blood pressure of 163/92 on 04/2021. Next appointment with PCP is tomorrow   Outreached patient to discuss hypertension control and medication management.    Current antihypertensives: losartan/HCTZ 100/25 mg daily  Patient has an automated upper arm home BP machine.  Current blood pressure readings: 117/68 with HR 69 today while we are on the phone.   Current meal patterns: has cut back on alcohol - drinks one drink a week, used to be more;   Current physical activity: is in a bowling league once weekly  Patient denies hypotensive signs and symptoms including dizziness, lightheadedness.  Patient denies hypertensive symptoms including headache, chest pain, shortness of breath.  Patient denies side effects related to antihypertensives  Assessment/Plan: - Currently controlled at home - - Reviewed goal blood pressure <130/80 - Praised for medication management principals - Extensive discussion of osteoporosis, how to take alendronate, calcium and vitamin D recommendations. Recommended calcium 1200 mg daily (divided in two); vitamin D [phone] units daily. Confirmed appropriate alendronate administration.  - Patient also asks about history of pre-diabetes. Discussed dietary principals of lean proteins, fruits and vegetables, whole grains, and low sugars. Encouraged physical activity, though limited by knee pain. Plan for knee replacement in October. Interested in talking about options for help around the house with cleaning and things, especially with plans for knee replacement.  Follow up with PCP tomorrow as scheduled.   Catie Eppie Gibson, PharmD, [facility] [phone]

## 2021-10-01 NOTE — Progress Notes (Unsigned)
Established Patient Office Visit  Subjective   Patient ID: Cheryl Prince, female    DOB: [birth year]  Age: 66 y.o. MRN: [id number]   History of present illness 07/26/20 Cheryl Prince presents for PCP to f/u    Former [facility] pt. The patient has been experiencing frequent chronic right shoulder and right hip pain for a few months. The right shoulder pain occurs throughout the shoulder area and joint. The pain is worse with movement and improves with keeping the right shoulder still. The patient takes Tylenol and gabapentin for the pain which is helpful. The patient has seen orthopedics in the past for this pain and was told she may need a right shoulder replacement in the future. She also states she saw physical therapy in the past for her shoulder which was helpful. The patient's right hip pain occurs mostly in the anterior and lateral aspect of the right hip. The pain improves with taking Tylenol and gabapentin. The pain is exacerbated by walking on the treadmill. She has seen physical therapy in the past for the right hip pain which was helpful. She would like a referral for physical therapy today.   The patient has a history of hypertension which is well controlled on her current dose of HCTZ.   The patient has chronic bronchitis for which she takes Flovent while experiencing symptoms and albuterol as needed.   The patient has multiple seasonal and food allergies which cause itching. The patient takes hydroxyzine and Singulair for these symptoms.   The patient has daily gastroesophageal reflux symptoms for which she takes Pepcid and Compazine.    The patient's medical record reveals a diagnosis of bipolar 1 disorder, though the patient denies having this diagnosis. She admits to having PTSD from [employer].    12/12/2020 Since the last visit in June the patient has been doing better.  She is undergoing physical therapy and her right hip and left shoulder are improved.   She is joined the [employer] she is doing swimming and other exercises she has a home gym as well she has lost weight she is got an air [place] and trying to improve her diet.  She is requesting a new electric lift chair because she has difficulty rising from a sitting position.  She does have an upcoming DEXA scan and mammogram in December.  Patient has no other real complaints blood pressures been stable.   03/19/21 Since the last visit in October the patient's undergone a great deal of stress and that she lost a cousin in a parent and short periods of time over 2 months.  On arrival blood pressure elevated 176/107.  She is drinking several vodkas a day as well.  Patient is gained some weight and is no longer exercising at the [facility] as she had previously.  She is on the hydrochlorothiazide alone 25 mg.  Her asthma is stable at this time on the Flovent and montelukast and as needed albuterol.  Patient declines to receive any vaccines as she has a prior history of hypersensitivity to all vaccines.   Her diet is also not been very compliant and she is eating a lot of carbohydrates and salt foods.  After resting for 10 minutes I rechecked her blood pressure and it was still elevated as previous.   05/02/2021 Patient is seen in return follow-up she does have some chronic right knee pain.  The patient's overall mood and stress levels have improved.  She is about to travel to  [place] to see her brother for his birthday.  She is trying to eat a healthier diet.  She knows she has a mammogram that is coming up in July of this year.  On arrival blood pressure 138/80.   The patient's been careful to watch her sugar and salt intake but does need improvement in this regard.   Patient is compliant with her blood pressure medications.  She remains on the losartan HCT 100/25.   Patient does need refills on her medications.  She does use the Singulair as needed hydroxyzine as needed Flovent is being used along with Pepcid.   Gabapentin is twice daily as well. 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  She has been working 6 hours a day 3 days a week but no new management wanted to work 5 days a week 6 hours each.  She has significant arthritis in the right knee neck and shoulder.  Because of this she can only work the 3-day week 6-hour day schedule.  She is  needing a letter stating as such.  8/8 Patient seen in return follow-up she has been monitoring blood pressure at home and has written improved numbers today on arrival 110/78 her blood pressures on her home meter are in similar range.  She has a planned total right knee replacement in October.  She is interested in getting PCS services.  Patient has no other complaints at this visit.    Patient Active Problem List   Diagnosis Date Noted   Bipolar I disorder (HCC) 07/26/2020   Right hip pain 07/26/2020   Bronchitis 07/26/2020   Allergies 07/26/2020   GERD (gastroesophageal reflux disease) 07/26/2020   At risk for decreased bone density 07/26/2020   PTSD (post-traumatic stress disorder) 05/30/2016   Alpha thalassemia trait 05/30/2016   Hypertension 05/30/2016   Rotator cuff tendonitis, left 05/30/2016   Osteoarthritis of left knee 05/30/2016   Carpal tunnel syndrome 05/30/2016   Prediabetes 05/30/2016   Onychomycosis 05/30/2016   Past Medical History:  Diagnosis Date   Allergy    Anemia    Anxiety    Arthritis    Asthma    borderline bronchitis   Bronchitis    GERD (gastroesophageal reflux disease)    Goiter, toxic diffuse 05/30/2016   Hypertension    S/P tonsillectomy 05/30/2016   Sleep apnea    history of, MD tookpt. off machine 7 yrs, ago    Past Surgical History:  Procedure Laterality Date   ABDOMINAL HYSTERECTOMY     adnoides     BREAST SURGERY     CHOLECYSTECTOMY     COLONOSCOPY     DILATION AND CURETTAGE OF UTERUS     GASTRIC BYPASS  1998   IUD REMOVAL     TONSILLECTOMY     TOTAL KNEE ARTHROPLASTY Left 09/22/2018   TOTAL KNEE ARTHROPLASTY Left 09/22/2018   Procedure: LEFT TOTAL KNEE ARTHROPLASTY;  Surgeon: Cammy Copa, MD;  Location: [facility] OR;  Service: Orthopedics;  Laterality: Left;   Social History   Tobacco Use   Smoking status: Former    Packs/day: 1.50    Years: 18.00    Total pack years: 27.00  Types: Cigarettes    Quit date: 09/16/1984    Years since quitting: 37.0   Smokeless tobacco: Never   Tobacco comments:    quit 1986  Vaping Use   Vaping Use: Never used  Substance Use Topics   Alcohol use: Yes    Comment: social   Drug use: No   Social History   Socioeconomic History   Marital status: Single    Spouse name: Not on file   Number of children: Not on file   Years of education: Not on file   Highest education level: Not on file  Occupational History   Not on file  Tobacco Use   Smoking status: Former    Packs/day: 1.50    Years: 18.00    Total pack years: 27.00    Types: Cigarettes    Quit date: 09/16/1984    Years since quitting: 37.0   Smokeless tobacco: Never   Tobacco comments:    quit 1986  Vaping Use   Vaping Use: Never used  Substance and Sexual Activity   Alcohol use: Yes    Comment: social   Drug use: No   Sexual activity: Not on file  Other Topics Concern   Not on file  Social History Narrative   Not on file   Social Determinants of Health   Financial Resource Strain: Not on file  Food Insecurity: Not on file  Transportation Needs: Not on file  Physical Activity: Not on file  Stress: Not on file  Social Connections: Not on file  Intimate Partner Violence: Not on file   Family Status  Relation Name Status   Mother  Deceased   Father  Alive        per pt father have a Pacemaker   Sister  Other       Carrier of Sickle Cell Anemia   Daughter  Alive   Mat Aunt  Alive   Mat Uncle  (Not Specified)   Neg Hx  (Not Specified)   Family History  Problem Relation Age of Onset   Hypertension Father    Diabetes Father    Lupus Daughter    Colon cancer Maternal Aunt    Colon cancer Maternal Uncle        4 mat uncles dx colon ca   Esophageal cancer Neg Hx    Rectal cancer Neg Hx    Stomach cancer Neg Hx    Allergies  Allergen Reactions   Morphine And Related Swelling   Penicillins Swelling   Pine Shortness Of Breath   Shellfish Allergy Shortness Of Breath   Miralax [Polyethylene Glycol] Itching   Nickel Other (See Comments)    Pt states she gets abscess from the earrings with nickel    Other Itching and Nausea And Vomiting    Mushrooms - feels high, and dizzy    Wheat Bran     Rash, tingling in mouth, vomiting   Grapeseed Extract [Nutritional Supplements] Rash    grapes   Latex Itching and Rash    "IF" condom, it makes it painful   Red Dye Swelling and Rash    The dye for checking thyroid.  Red Cast Dye   Sulfites Rash    Tingling in mouth      Review of Systems  Constitutional:  Negative for chills, diaphoresis, fever, malaise/fatigue and weight loss.  HENT:  Negative for congestion, hearing loss, nosebleeds, sore throat and tinnitus.   Eyes:  Negative for blurred vision, photophobia and redness.  Respiratory:  Negative for cough, hemoptysis, sputum production, shortness of breath, wheezing and stridor.   Cardiovascular:  Negative for chest pain, palpitations, orthopnea, claudication, leg swelling and PND.  Gastrointestinal:  Negative for abdominal pain, blood in stool, constipation, diarrhea, heartburn, nausea and vomiting.  Genitourinary:  Negative for dysuria, flank pain, frequency, hematuria and urgency.  Musculoskeletal:  Negative for back pain, falls, joint pain, myalgias and neck pain.  Skin:  Negative for  itching and rash.  Neurological:  Negative for dizziness, tingling, tremors, sensory change, speech change, focal weakness, seizures, loss of consciousness, weakness and headaches.  Endo/Heme/Allergies:  Negative for environmental allergies and polydipsia. Does not bruise/bleed easily.  Psychiatric/Behavioral:  Negative for depression, memory loss, substance abuse and suicidal ideas. The patient is not nervous/anxious and does not have insomnia.       Objective:     BP 110/78   Pulse 67   Temp 98.2 F (36.8 C)   Ht 5\' 4"  (1.626 m)   Wt 222 lb (100.7 kg)   BMI 38.11 kg/m  BP Readings from Last 3 Encounters:  10/02/21 110/78  05/02/21 (!) 163/92  03/23/21 (!) 151/98   Wt Readings from Last 3 Encounters:  10/02/21 222 lb (100.7 kg)  08/27/21 225 lb 3.2 oz (102.2 kg)  05/02/21 225 lb (102.1 kg)      Physical Exam Vitals reviewed.  Constitutional:      Appearance: Normal appearance. She is well-developed. She is obese. She is not diaphoretic.  HENT:     Head: Normocephalic and atraumatic.     Nose: No nasal deformity, septal deviation, mucosal edema or rhinorrhea.     Right Sinus: No maxillary sinus tenderness or frontal sinus tenderness.     Left Sinus: No maxillary sinus tenderness or frontal sinus tenderness.     Mouth/Throat:     Pharynx: No oropharyngeal exudate.  Eyes:     General: No scleral icterus.    Conjunctiva/sclera: Conjunctivae normal.     Pupils: Pupils are equal, round, and reactive to light.  Neck:     Thyroid: No thyromegaly.     Vascular: No carotid bruit or JVD.     Trachea: Trachea normal. No tracheal tenderness or tracheal deviation.  Cardiovascular:     Rate and Rhythm: Normal rate and regular rhythm.     Chest Wall: PMI is not displaced.     Pulses: Normal pulses. No decreased pulses.     Heart sounds: Normal heart sounds, S1 normal and S2 normal. Heart sounds not distant. No murmur heard.    No systolic murmur is present.     No diastolic  murmur is present.     No friction rub. No gallop. No S3 or S4 sounds.  Pulmonary:     Effort: No tachypnea, accessory muscle usage or respiratory distress.     Breath sounds: No stridor. No decreased breath sounds, wheezing, rhonchi or rales.  Chest:     Chest wall: No tenderness.  Abdominal:     General: Bowel sounds are normal. There is no distension.     Palpations: Abdomen is soft. Abdomen is not rigid.     Tenderness: There is no abdominal tenderness. There is no guarding or rebound.  Musculoskeletal:        General: Normal range of motion.     Cervical back: Normal range of motion and neck supple. No edema, erythema or rigidity. No muscular tenderness. Normal range of motion.  Lymphadenopathy:     Head:  Right side of head: No submental or submandibular adenopathy.     Left side of head: No submental or submandibular adenopathy.     Cervical: No cervical adenopathy.  Skin:    General: Skin is warm and dry.     Coloration: Skin is not pale.     Findings: No rash.     Nails: There is no clubbing.  Neurological:     Mental Status: She is alert and oriented to person, place, and time.     Sensory: No sensory deficit.  Psychiatric:        Speech: Speech normal.        Behavior: Behavior normal.    No exam this is a phone visit  No results found for any visits on 10/02/21.  Last CBC Lab Results  Component Value Date   WBC 3.2 (L) 02/17/2020   HGB 12.0 02/17/2020   HCT 38.5 02/17/2020   MCV 78 (L) 02/17/2020   MCH 24.3 (L) 02/17/2020   RDW 14.9 02/17/2020   PLT 238 02/17/2020   Last metabolic panel Lab Results  Component Value Date   GLUCOSE 77 02/17/2020   NA 140 02/17/2020   K 3.8 02/17/2020   CL 101 02/17/2020   CO2 22 02/17/2020   BUN 22 02/17/2020   CREATININE 0.84 02/17/2020   GFRNONAA 73 02/17/2020   CALCIUM 9.0 02/17/2020   PROT 7.1 05/13/2019   ALBUMIN 4.1 05/13/2019   LABGLOB 3.0 05/13/2019   AGRATIO 1.4 05/13/2019   BILITOT 0.3 05/13/2019    ALKPHOS 143 (H) 05/13/2019   AST 32 05/13/2019   ALT 30 05/13/2019   ANIONGAP 10 09/17/2018   Last lipids Lab Results  Component Value Date   CHOL 146 06/30/2018   HDL 67 06/30/2018   LDLCALC 65 06/30/2018   TRIG 70 06/30/2018   CHOLHDL 2.2 06/30/2018   Last hemoglobin A1c Lab Results  Component Value Date   HGBA1C 5.8 (A) 12/23/2018   Last thyroid functions Lab Results  Component Value Date   TSH 1.210 12/11/2016   Last vitamin D No results found for: "25OHVITD2", "25OHVITD3", "VD25OH" Last vitamin B12 and Folate No results found for: "VITAMINB12", "FOLATE"    The ASCVD Risk score (Arnett DK, et al., 2019) failed to calculate for the following reasons:   Cannot find a previous HDL lab   Cannot find a previous total cholesterol lab    Assessment & Plan:   Problem List Items Addressed This Visit       Cardiovascular and Mediastinum   Hypertension - Primary    Hypertension well controlled no change in medication      Relevant Medications   losartan-hydrochlorothiazide (HYZAAR) 100-25 MG tablet   Other Relevant Orders   Comprehensive metabolic panel   CBC with Differential/Platelet     Musculoskeletal and Integument   Osteoarthritis of left knee    As planned total knee replacement in October        Other   Prediabetes    No change in medications      Relevant Orders   Lipid panel   Hemoglobin A1c   Other Visit Diagnoses     Medication refill       Relevant Medications   gabapentin (NEURONTIN) 300 MG capsule     Follow-up in 4 months

## 2021-10-01 NOTE — Patient Instructions (Addendum)
Tulani,   It was great talking to you today!  Check your blood pressure once weekly, and any time you have concerning symptoms like headache, chest pain, dizziness, shortness of breath, or vision changes.   Our goal is less than 130/80.  To appropriately check your blood pressure, make sure you do the following:  1) Avoid caffeine, exercise, or tobacco products for 30 minutes before checking. Empty your bladder. 2) Sit with your back supported in a flat-backed chair. Rest your arm on something flat (arm of the chair, table, etc). 3) Sit still with your feet flat on the floor, resting, for at least 5 minutes.  4) Check your blood pressure. Take 1-2 readings.  5) Write down these readings and bring with you to any provider appointments.  Bring your home blood pressure machine with you to a provider's office for accuracy comparison at least once a year.   Make sure you take your blood pressure medications before you come to any office visit, even if you were asked to fast for labs.   Catie Eppie Gibson, PharmD, [facility] [phone]

## 2021-10-02 ENCOUNTER — Encounter: Payer: Self-pay | Admitting: Critical Care Medicine

## 2021-10-02 ENCOUNTER — Telehealth: Payer: Self-pay | Admitting: Critical Care Medicine

## 2021-10-02 ENCOUNTER — Ambulatory Visit: Payer: Medicare Other | Attending: Critical Care Medicine | Admitting: Critical Care Medicine

## 2021-10-02 VITALS — BP 110/78 | HR 67 | Temp 98.2°F | Ht 64.0 in | Wt 222.0 lb

## 2021-10-02 DIAGNOSIS — R7303 Prediabetes: Secondary | ICD-10-CM

## 2021-10-02 DIAGNOSIS — I10 Essential (primary) hypertension: Secondary | ICD-10-CM | POA: Diagnosis not present

## 2021-10-02 DIAGNOSIS — Z760 Encounter for issue of repeat prescription: Secondary | ICD-10-CM

## 2021-10-02 DIAGNOSIS — M1712 Unilateral primary osteoarthritis, left knee: Secondary | ICD-10-CM

## 2021-10-02 MED ORDER — ALBUTEROL SULFATE HFA 108 (90 BASE) MCG/ACT IN AERS
2.0000 | INHALATION_SPRAY | Freq: Four times a day (QID) | RESPIRATORY_TRACT | 2 refills | Status: DC | PRN
Start: 2021-10-02 — End: 2022-04-17

## 2021-10-02 MED ORDER — LOSARTAN POTASSIUM-HCTZ 100-25 MG PO TABS: 1.0000 | ORAL_TABLET | Freq: Every day | ORAL | 3 refills | Status: DC

## 2021-10-02 MED ORDER — MONTELUKAST SODIUM 10 MG PO TABS: ORAL_TABLET | ORAL | 3 refills | Status: DC

## 2021-10-02 MED ORDER — GABAPENTIN 300 MG PO CAPS: 300.0000 mg | ORAL_CAPSULE | Freq: Two times a day (BID) | ORAL | 3 refills | Status: DC

## 2021-10-02 NOTE — Telephone Encounter (Signed)
Patient would like PCS services 2 to 3 days a week via Medicare she has difficulty walking and ambulating she is getting a lift chair on her own and she will have total knee replacement coming up October   Is this possible

## 2021-10-02 NOTE — Assessment & Plan Note (Signed)
No change in medications

## 2021-10-02 NOTE — Assessment & Plan Note (Signed)
As planned total knee replacement in October

## 2021-10-02 NOTE — Patient Instructions (Signed)
Refills on medications sent to your [facility] pharmacy  No change in medications  Complete screening labs obtained at this visit  You are medically clear for your planned knee replacement in October  We will see if we can get a PCS order in for you  Return to Dr. Delford Field 4 months

## 2021-10-02 NOTE — Assessment & Plan Note (Signed)
Hypertension well controlled no change in medication 

## 2021-10-03 LAB — COMPREHENSIVE METABOLIC PANEL
ALT: 18 IU/L (ref 0–32)
AST: 22 IU/L (ref 0–40)
Albumin/Globulin Ratio: 1.4 (ref 1.2–2.2)
Albumin: 4.6 g/dL (ref 3.9–4.9)
Alkaline Phosphatase: 110 IU/L (ref 44–121)
BUN/Creatinine Ratio: 21 (ref 12–28)
BUN: 16 mg/dL (ref 8–27)
Bilirubin Total: 0.6 mg/dL (ref 0.0–1.2)
CO2: 22 mmol/L (ref 20–29)
Calcium: 8.9 mg/dL (ref 8.7–10.3)
Chloride: 101 mmol/L (ref 96–106)
Creatinine, Ser: 0.76 mg/dL (ref 0.57–1.00)
Globulin, Total: 3.3 g/dL (ref 1.5–4.5)
Glucose: 86 mg/dL (ref 70–99)
Potassium: 3.9 mmol/L (ref 3.5–5.2)
Sodium: 141 mmol/L (ref 134–144)
Total Protein: 7.9 g/dL (ref 6.0–8.5)
eGFR: 85 mL/min/{1.73_m2} (ref >59)

## 2021-10-03 LAB — CBC WITH DIFFERENTIAL/PLATELET
Basophils Absolute: 0 10*3/uL (ref 0.0–0.2)
Basos: 1 %
EOS (ABSOLUTE): 0.1 10*3/uL (ref 0.0–0.4)
Eos: 2 %
Hematocrit: 36.6 % (ref 34.0–46.6)
Hemoglobin: 11.6 g/dL (ref 11.1–15.9)
Immature Grans (Abs): 0 10*3/uL (ref 0.0–0.1)
Immature Granulocytes: 0 %
Lymphocytes Absolute: 1.5 10*3/uL (ref 0.7–3.1)
Lymphs: 43 %
MCH: 24.6 pg — ABNORMAL LOW (ref 26.6–33.0)
MCHC: 31.7 g/dL (ref 31.5–35.7)
MCV: 78 fL — ABNORMAL LOW (ref 79–97)
Monocytes Absolute: 0.5 10*3/uL (ref 0.1–0.9)
Monocytes: 14 %
Neutrophils Absolute: 1.4 10*3/uL (ref 1.4–7.0)
Neutrophils: 40 %
Platelets: 228 10*3/uL (ref 150–450)
RBC: 4.71 x10E6/uL (ref 3.77–5.28)
RDW: 15.1 % (ref 11.7–15.4)
WBC: 3.4 10*3/uL (ref 3.4–10.8)

## 2021-10-03 LAB — HEMOGLOBIN A1C
Est. average glucose Bld gHb Est-mCnc: 117 mg/dL
Hgb A1c MFr Bld: 5.7 % — ABNORMAL HIGH (ref 4.8–5.6)

## 2021-10-03 LAB — LIPID PANEL
Chol/HDL Ratio: 3.1 ratio (ref 0.0–4.4)
Cholesterol, Total: 187 mg/dL (ref 100–199)
HDL: 61 mg/dL (ref >39)
LDL Chol Calc (NIH): 110 mg/dL — ABNORMAL HIGH (ref 0–99)
Triglycerides: 90 mg/dL (ref 0–149)
VLDL Cholesterol Cal: 16 mg/dL (ref 5–40)

## 2021-10-03 NOTE — Progress Notes (Signed)
Let pt know blood count normal, cholesterol borderline high, stay on omega 3 prediabetes does not require medications is mild follow healthy diet, let the pt know kidney is normal, liver is normal

## 2021-10-05 ENCOUNTER — Telehealth: Payer: Self-pay

## 2021-10-05 NOTE — Telephone Encounter (Signed)
-----   Message from Storm Frisk, MD sent at 10/03/2021  8:29 AM EDT ----- Let pt know blood count normal, cholesterol borderline high, stay on omega 3 prediabetes does not require medications is mild follow healthy diet, let the pt know kidney is normal, liver is normal

## 2021-10-05 NOTE — Telephone Encounter (Signed)
Pt was called and vm was left, Information has been sent to nurse pool.   

## 2021-10-24 ENCOUNTER — Other Ambulatory Visit: Payer: Self-pay

## 2021-11-19 NOTE — Progress Notes (Signed)
Surgical Instructions    Your procedure is scheduled on Thursday, October 5th, 2023.   Report to [facility] Main Entrance "A" at 09:00 A.M., then check in with the Admitting office.  Call this number if you have problems the morning of surgery:  [phone]   If you have any questions prior to your surgery date call [phone]: Open Monday-Friday 8am-4pm    Remember:  Do not eat after midnight the night before your surgery  You may drink clear liquids until 08:00 the morning of your surgery.   Clear liquids allowed are: Water, Non-Citrus Juices (without pulp), Carbonated Beverages, Clear Tea, Black Coffee ONLY (NO MILK, CREAM OR POWDERED CREAMER of any kind), and Gatorade  Patient Instructions  The night before surgery:  No food after midnight. ONLY clear liquids after midnight  The day of surgery (if you do NOT have diabetes):  Drink ONE (1) Pre-Surgery Clear Ensure by 08:00 the morning of surgery. Drink in one sitting. Do not sip.  This drink was given to you during your hospital  pre-op appointment visit.  Nothing else to drink after completing the  Pre-Surgery Clear Ensure.         If you have questions, please contact your surgeon's office.     Take these medicines the morning of surgery with A SIP OF WATER:   acetaminophen (TYLENOL) cetirizine (ZYRTEC) famotidine (PEPCID) gabapentin (NEURONTIN)  If needed:  albuterol (PROAIR HFA) FLOVENT HFA  hydrOXYzine (ATARAX)  methocarbamol (ROBAXIN) montelukast (SINGULAIR)  Please bring all inhalers with you the day of surgery.    As of today, STOP taking any Aspirin (unless otherwise instructed by your surgeon) Aleve, Naproxen, Ibuprofen, Motrin, Advil, Goody's, BC's, all herbal medications, fish oil, and all vitamins.   The day of surgery:          Do not wear jewelry or makeup. Do not wear lotions, powders, perfumes or deodorant. Do not shave 48 hours prior to surgery.   Do not bring valuables to the  hospital. Do not wear nail polish, gel polish, artificial nails, or any other type of covering on natural nails (fingers and toes) If you have artificial nails or gel coating that need to be removed by a nail salon, please have this removed prior to surgery. Artificial nails or gel coating may interfere with anesthesia's ability to adequately monitor your vital signs.  [facility] is not responsible for any belongings or valuables.    Do NOT Smoke (Tobacco/Vaping)  24 hours prior to your procedure  If you use a CPAP at night, you may bring your mask for your overnight stay.   Contacts, glasses, hearing aids, dentures or partials may not be worn into surgery, please bring cases for these belongings   For patients admitted to the hospital, discharge time will be determined by your treatment team.   Patients discharged the day of surgery will not be allowed to drive home, and someone needs to stay with them for 24 hours.   SURGICAL WAITING ROOM VISITATION Patients having surgery or a procedure may have no more than 2 support people in the waiting area - these visitors may rotate.   Children under the age of 29 must have an adult with them who is not the patient. If the patient needs to stay at the hospital during part of their recovery, the visitor guidelines for inpatient rooms apply. Pre-op nurse will coordinate an appropriate time for 1 support person to accompany patient in pre-op.  This support person  may not rotate.   Please refer to the [facility] website for the visitor guidelines for Inpatients (after your surgery is over and you are in a regular room).    Special instructions:    Oral Hygiene is also important to reduce your risk of infection.  Remember - BRUSH YOUR TEETH THE MORNING OF SURGERY WITH YOUR REGULAR TOOTHPASTE   [facility]- Preparing For Surgery  Before surgery, you can play an important role. Because skin is not sterile, your skin needs to be as free of germs as  possible. You can reduce the number of germs on your skin by washing with CHG (chlorahexidine gluconate) Soap before surgery.  CHG is an antiseptic cleaner which kills germs and bonds with the skin to continue killing germs even after washing.     Please do not use if you have an allergy to CHG or antibacterial soaps. If your skin becomes reddened/irritated stop using the CHG.  Do not shave (including legs and underarms) for at least 48 hours prior to first CHG shower. It is OK to shave your face.  Please follow these instructions carefully.     Shower the NIGHT BEFORE SURGERY and the MORNING OF SURGERY with CHG Soap.   If you chose to wash your hair, wash your hair first as usual with your normal shampoo. After you shampoo, rinse your hair and body thoroughly to remove the shampoo.  Then [employer] and genitals (private parts) with your normal soap and rinse thoroughly to remove soap.  After that Use CHG Soap as you would any other liquid soap. You can apply CHG directly to the skin and wash gently with a scrungie or a clean washcloth.   Apply the CHG Soap to your body ONLY FROM THE NECK DOWN.  Do not use on open wounds or open sores. Avoid contact with your eyes, ears, mouth and genitals (private parts). Wash Face and genitals (private parts)  with your normal soap.   Wash thoroughly, paying special attention to the area where your surgery will be performed.  Thoroughly rinse your body with warm water from the neck down.  DO NOT shower/wash with your normal soap after using and rinsing off the CHG Soap.  Pat yourself dry with a CLEAN TOWEL.  Wear CLEAN PAJAMAS to bed the night before surgery  Place CLEAN SHEETS on your bed the night before your surgery  DO NOT SLEEP WITH PETS.   Day of Surgery:  Take a shower with CHG soap. Wear Clean/Comfortable clothing the morning of surgery Do not apply any deodorants/lotions.   Remember to brush your teeth WITH YOUR REGULAR  TOOTHPASTE.    If you received a COVID test during your pre-op visit, it is requested that you wear a mask when out in public, stay away from anyone that may not be feeling well, and notify your surgeon if you develop symptoms. If you have been in contact with anyone that has tested positive in the last 10 days, please notify your surgeon.    Please read over the following fact sheets that you were given.

## 2021-11-20 ENCOUNTER — Other Ambulatory Visit: Payer: Self-pay

## 2021-11-20 ENCOUNTER — Encounter (HOSPITAL_COMMUNITY)
Admission: RE | Admit: 2021-11-20 | Discharge: 2021-11-20 | Disposition: A | Discharge status: Home / Self Care | Payer: Medicare Other | Source: Ambulatory Visit | Attending: Orthopedic Surgery | Admitting: Orthopedic Surgery

## 2021-11-20 ENCOUNTER — Encounter (HOSPITAL_COMMUNITY): Payer: Self-pay

## 2021-11-20 VITALS — BP 145/86 | HR 65 | Temp 98.9°F | Resp 18 | Ht 64.0 in | Wt 225.1 lb

## 2021-11-20 DIAGNOSIS — Z01818 Encounter for other preprocedural examination: Secondary | ICD-10-CM | POA: Diagnosis not present

## 2021-11-20 LAB — BASIC METABOLIC PANEL
Anion gap: 5 (ref 5–15)
BUN: 9 mg/dL (ref 8–23)
CO2: 26 mmol/L (ref 22–32)
Calcium: 8.4 mg/dL — ABNORMAL LOW (ref 8.9–10.3)
Chloride: 104 mmol/L (ref 98–111)
Creatinine, Ser: 0.74 mg/dL (ref 0.44–1.00)
GFR, Estimated: >60 mL/min (ref >60)
Glucose, Bld: 98 mg/dL (ref 70–99)
Potassium: 3.5 mmol/L (ref 3.5–5.1)
Sodium: 135 mmol/L (ref 135–145)

## 2021-11-20 LAB — SURGICAL PCR SCREEN
MRSA, PCR: NEGATIVE
Staphylococcus aureus: NEGATIVE

## 2021-11-20 LAB — URINALYSIS, COMPLETE (UACMP) WITH MICROSCOPIC
Bilirubin Urine: NEGATIVE
Glucose, UA: NEGATIVE mg/dL
Hgb urine dipstick: NEGATIVE
Ketones, ur: NEGATIVE mg/dL
Leukocytes,Ua: NEGATIVE
Nitrite: NEGATIVE
Protein, ur: NEGATIVE mg/dL
Specific Gravity, Urine: 1.02 (ref 1.005–1.030)
pH: 6.5 (ref 5.0–8.0)

## 2021-11-20 LAB — CBC
HCT: 35.2 % — ABNORMAL LOW (ref 36.0–46.0)
Hemoglobin: 11.1 g/dL — ABNORMAL LOW (ref 12.0–15.0)
MCH: 24.3 pg — ABNORMAL LOW (ref 26.0–34.0)
MCHC: 31.5 g/dL (ref 30.0–36.0)
MCV: 77.2 fL — ABNORMAL LOW (ref 80.0–100.0)
Platelets: 260 10*3/uL (ref 150–400)
RBC: 4.56 MIL/uL (ref 3.87–5.11)
RDW: 15.7 % — ABNORMAL HIGH (ref 11.5–15.5)
WBC: 4.1 10*3/uL (ref 4.0–10.5)
nRBC: 0 % (ref 0.0–0.2)

## 2021-11-20 NOTE — Progress Notes (Deleted)
Surgical Instructions    Your procedure is scheduled on Thursday, October 5th, 2023.   Report to [facility] Main Entrance "A" at 09:00 A.M., then check in with the Admitting office.  Call this number if you have problems the morning of surgery:  [phone]   If you have any questions prior to your surgery date call [phone]: Open Monday-Friday 8am-4pm    Remember:  Do not eat after midnight the night before your surgery  You may drink clear liquids until 08:00 the morning of your surgery.   Clear liquids allowed are: Water, Non-Citrus Juices (without pulp), Carbonated Beverages, Clear Tea, Black Coffee ONLY (NO MILK, CREAM OR POWDERED CREAMER of any kind), and Gatorade  Patient Instructions  The night before surgery:  No food after midnight. ONLY clear liquids after midnight   The day of surgery (if you do NOT have diabetes):  Drink ONE (1) Pre-Surgery Clear Ensure by 08:00 the morning of surgery. Drink in one sitting. Do not sip.  This drink was given to you during your hospital  pre-op appointment visit.  Nothing else to drink after completing the  Pre-Surgery Clear Ensure.         If you have questions, please contact your surgeon's office.     Take these medicines the morning of surgery with A SIP OF WATER:   acetaminophen (TYLENOL) cetirizine (ZYRTEC) famotidine (PEPCID) gabapentin (NEURONTIN)  If needed: albuterol (PROAIR HFA) FLOVENT HFA  hydrOXYzine (ATARAX)  methocarbamol (ROBAXIN) montelukast (SINGULAIR)  Please bring all inhalers with you the day of surgery.    As of today, STOP taking any Aspirin (unless otherwise instructed by your surgeon) Aleve, Naproxen, Ibuprofen, Motrin, Advil, Goody's, BC's, all herbal medications, fish oil, and all vitamins.   The day of surgery:          Do not wear jewelry or makeup. Do not wear lotions, powders, perfumes or deodorant. Do not shave 48 hours prior to surgery.   Do not bring valuables to the  hospital. Do not wear nail polish, gel polish, artificial nails, or any other type of covering on natural nails (fingers and toes) If you have artificial nails or gel coating that need to be removed by a nail salon, please have this removed prior to surgery. Artificial nails or gel coating may interfere with anesthesia's ability to adequately monitor your vital signs.  [facility] is not responsible for any belongings or valuables.    Do NOT Smoke (Tobacco/Vaping)  24 hours prior to your procedure  If you use a CPAP at night, you may bring your mask for your overnight stay.   Contacts, glasses, hearing aids, dentures or partials may not be worn into surgery, please bring cases for these belongings   For patients admitted to the hospital, discharge time will be determined by your treatment team.   Patients discharged the day of surgery will not be allowed to drive home, and someone needs to stay with them for 24 hours.   SURGICAL WAITING ROOM VISITATION Patients having surgery or a procedure may have no more than 2 support people in the waiting area - these visitors may rotate.   Children under the age of 23 must have an adult with them who is not the patient. If the patient needs to stay at the hospital during part of their recovery, the visitor guidelines for inpatient rooms apply. Pre-op nurse will coordinate an appropriate time for 1 support person to accompany patient in pre-op.  This support person  may not rotate.   Please refer to the [facility] website for the visitor guidelines for Inpatients (after your surgery is over and you are in a regular room).                                     [place]- Preparing for Total Shoulder Arthroplasty   Before surgery, you can play an important role. Because skin is not sterile, your skin needs to be as free of germs as possible. You can reduce the number of germs on your skin by using the following products. Benzoyl Peroxide Gel Reduces  the number of germs present on the skin Applied twice a day to shoulder area starting two days before surgery   Chlorhexidine Gluconate (CHG) Soap An antiseptic cleaner that kills germs and bonds with the skin to continue killing germs even after washing Used for showering the night before surgery and morning of surgery   Oral Hygiene is also important to reduce your risk of infection.                                    Remember - BRUSH YOUR TEETH THE MORNING OF SURGERY WITH YOUR REGULAR TOOTHPASTE  ==================================================================  Please follow these instructions carefully:  BENZOYL PEROXIDE 5% GEL  Please do not use if you have an allergy to benzoyl peroxide.   If your skin becomes reddened/irritated stop using the benzoyl peroxide.  Starting two days before surgery, apply as follows: Apply benzoyl peroxide in the morning and at night. Apply after taking a shower. If you are not taking a shower clean entire shoulder front, back, and side along with the armpit with a clean wet washcloth.  Place a quarter-sized dollop on your shoulder and rub in thoroughly, making sure to cover the front, back, and side of your shoulder, along with the armpit.   2 days before ____ AM   ____ PM              1 day before ____ AM   ____ PM                             Do this twice a day for two days.  (Last application is the night before surgery, AFTER using the CHG soap as described below).  Do NOT apply benzoyl peroxide gel on the day of surgery.  CHLORHEXIDINE GLUCONATE (CHG) SOAP  Please do not use if you have an allergy to CHG or antibacterial soaps. If your skin becomes reddened/irritated stop using the CHG.   Do not shave (including legs and underarms) for at least 48 hours prior to first CHG shower. It is OK to shave your face.  Starting the night before surgery, use CHG soap as follows:  Shower the NIGHT BEFORE SURGERY and MORNING OF SURGERY with  CHG.  If you choose to wash your hair, wash your hair first as usual with your normal shampoo.  After shampooing, rinse your hair and body thoroughly to remove the shampoo.  Use CHG as you would any other liquid soap.  You can apply CHG directly to the skin and wash gently with a scrungie or a clean washcloth.  Apply the CHG soap to your body ONLY FROM THE NECK DOWN.  Do not use on   open wounds or open sores.  Avoid contact with your eyes, ears, mouth, and genitals (private parts).  Wash face and genitals (private parts) with your normal soap.  Wash thoroughly, paying special attention to the area where your surgery will be performed.  Thoroughly rinse your body with warm water from the neck down.  DO NOT shower/wash with your normal soap after using and rinsing off the CHG soap.   Pat yourself dry with a CLEAN TOWEL.    Apply benzoyl peroxide.   Wear CLEAN PAJAMAS to bed the night before surgery; wear comfortable clothes the morning of surgery.  Place CLEAN SHEETS on your bed the night of your first shower and DO NOT SLEEP WITH PETS.  Day of Surgery: Shower as above Do not apply any deodorants/lotions.  Please wear clean clothes to the hospital/surgery center.   Remember to brush your teeth WITH YOUR REGULAR TOOTHPASTE.     If you received a COVID test during your pre-op visit, it is requested that you wear a mask when out in public, stay away from anyone that may not be feeling well, and notify your surgeon if you develop symptoms. If you have been in contact with anyone that has tested positive in the last 10 days, please notify your surgeon.    Please read over the following fact sheets that you were given.   

## 2021-11-20 NOTE — Progress Notes (Deleted)
Surgical Instructions    Your procedure is scheduled on Thursday, October 5th, 2023.   Report to [facility] Main Entrance "A" at 09:00 A.M., then check in with the Admitting office.  Call this number if you have problems the morning of surgery:  [phone]   If you have any questions prior to your surgery date call [phone]: Open Monday-Friday 8am-4pm    Remember:  Do not eat after midnight the night before your surgery  You may drink clear liquids until 08:00 the morning of your surgery.   Clear liquids allowed are: Water, Non-Citrus Juices (without pulp), Carbonated Beverages, Clear Tea, Black Coffee ONLY (NO MILK, CREAM OR POWDERED CREAMER of any kind), and Gatorade  Patient Instructions  The night before surgery:  No food after midnight. ONLY clear liquids after midnight   The day of surgery (if you do NOT have diabetes):  Drink ONE (1) Pre-Surgery Clear Ensure by 08:00 the morning of surgery. Drink in one sitting. Do not sip.  This drink was given to you during your hospital  pre-op appointment visit.  Nothing else to drink after completing the  Pre-Surgery Clear Ensure.         If you have questions, please contact your surgeon's office.     Take these medicines the morning of surgery with A SIP OF WATER:   acetaminophen (TYLENOL) cetirizine (ZYRTEC) famotidine (PEPCID) gabapentin (NEURONTIN)  If needed: albuterol (PROAIR HFA) FLOVENT HFA  hydrOXYzine (ATARAX)  methocarbamol (ROBAXIN) montelukast (SINGULAIR)  Please bring all inhalers with you the day of surgery.    As of today, STOP taking any Aspirin (unless otherwise instructed by your surgeon) Aleve, Naproxen, Ibuprofen, Motrin, Advil, Goody's, BC's, all herbal medications, fish oil, and all vitamins.   The day of surgery:          Do not wear jewelry or makeup. Do not wear lotions, powders, perfumes or deodorant. Do not shave 48 hours prior to surgery.   Do not bring valuables to the  hospital. Do not wear nail polish, gel polish, artificial nails, or any other type of covering on natural nails (fingers and toes) If you have artificial nails or gel coating that need to be removed by a nail salon, please have this removed prior to surgery. Artificial nails or gel coating may interfere with anesthesia's ability to adequately monitor your vital signs.  [facility] is not responsible for any belongings or valuables.    Do NOT Smoke (Tobacco/Vaping)  24 hours prior to your procedure  If you use a CPAP at night, you may bring your mask for your overnight stay.   Contacts, glasses, hearing aids, dentures or partials may not be worn into surgery, please bring cases for these belongings   For patients admitted to the hospital, discharge time will be determined by your treatment team.   Patients discharged the day of surgery will not be allowed to drive home, and someone needs to stay with them for 24 hours.   SURGICAL WAITING ROOM VISITATION Patients having surgery or a procedure may have no more than 2 support people in the waiting area - these visitors may rotate.   Children under the age of 23 must have an adult with them who is not the patient. If the patient needs to stay at the hospital during part of their recovery, the visitor guidelines for inpatient rooms apply. Pre-op nurse will coordinate an appropriate time for 1 support person to accompany patient in pre-op.  This support person  may not rotate.   Please refer to the [facility] website for the visitor guidelines for Inpatients (after your surgery is over and you are in a regular room).                                     [place]- Preparing for Total Shoulder Arthroplasty   Before surgery, you can play an important role. Because skin is not sterile, your skin needs to be as free of germs as possible. You can reduce the number of germs on your skin by using the following products. Benzoyl Peroxide Gel Reduces  the number of germs present on the skin Applied twice a day to shoulder area starting two days before surgery   Chlorhexidine Gluconate (CHG) Soap An antiseptic cleaner that kills germs and bonds with the skin to continue killing germs even after washing Used for showering the night before surgery and morning of surgery   Oral Hygiene is also important to reduce your risk of infection.                                    Remember - BRUSH YOUR TEETH THE MORNING OF SURGERY WITH YOUR REGULAR TOOTHPASTE  ==================================================================  Please follow these instructions carefully:  BENZOYL PEROXIDE 5% GEL  Please do not use if you have an allergy to benzoyl peroxide.   If your skin becomes reddened/irritated stop using the benzoyl peroxide.  Starting two days before surgery, apply as follows: Apply benzoyl peroxide in the morning and at night. Apply after taking a shower. If you are not taking a shower clean entire shoulder front, back, and side along with the armpit with a clean wet washcloth.  Place a quarter-sized dollop on your shoulder and rub in thoroughly, making sure to cover the front, back, and side of your shoulder, along with the armpit.   2 days before ____ AM   ____ PM              1 day before ____ AM   ____ PM                             Do this twice a day for two days.  (Last application is the night before surgery, AFTER using the CHG soap as described below).  Do NOT apply benzoyl peroxide gel on the day of surgery.  CHLORHEXIDINE GLUCONATE (CHG) SOAP  Please do not use if you have an allergy to CHG or antibacterial soaps. If your skin becomes reddened/irritated stop using the CHG.   Do not shave (including legs and underarms) for at least 48 hours prior to first CHG shower. It is OK to shave your face.  Starting the night before surgery, use CHG soap as follows:  Shower the NIGHT BEFORE SURGERY and MORNING OF SURGERY with  CHG.  If you choose to wash your hair, wash your hair first as usual with your normal shampoo.  After shampooing, rinse your hair and body thoroughly to remove the shampoo.  Use CHG as you would any other liquid soap.  You can apply CHG directly to the skin and wash gently with a scrungie or a clean washcloth.  Apply the CHG soap to your body ONLY FROM THE NECK DOWN.  Do not use on  open wounds or open sores.  Avoid contact with your eyes, ears, mouth, and genitals (private parts).  Wash face and genitals (private parts) with your normal soap.  Wash thoroughly, paying special attention to the area where your surgery will be performed.  Thoroughly rinse your body with warm water from the neck down.  DO NOT shower/wash with your normal soap after using and rinsing off the CHG soap.   Pat yourself dry with a CLEAN TOWEL.    Apply benzoyl peroxide.   Wear CLEAN PAJAMAS to bed the night before surgery; wear comfortable clothes the morning of surgery.  Place CLEAN SHEETS on your bed the night of your first shower and DO NOT SLEEP WITH PETS.  Day of Surgery: Shower as above Do not apply any deodorants/lotions.  Please wear clean clothes to the hospital/surgery center.   Remember to brush your teeth WITH YOUR REGULAR TOOTHPASTE.     If you received a COVID test during your pre-op visit, it is requested that you wear a mask when out in public, stay away from anyone that may not be feeling well, and notify your surgeon if you develop symptoms. If you have been in contact with anyone that has tested positive in the last 10 days, please notify your surgeon.    Please read over the following fact sheets that you were given.

## 2021-11-20 NOTE — Progress Notes (Signed)
PCP - Dr. Asencion Noble Cardiologist - denies  PPM/ICD - n/a  Chest x-ray - n/a EKG - 11/20/21 Stress Test - denies ECHO - denies Cardiac Cath - denies  Sleep Study - Hx of OSA. Pt had gastric bypass and no longer needs CPAP CPAP - denies uses  Blood Thinner Instructions: n/a Aspirin Instructions: n/a  ERAS Protcol -Clear liquids until 0800 DOS PRE-SURGERY Ensure or G2- Ensure provided.  COVID TEST- n/a  Anesthesia review: no  Patient denies shortness of breath, fever, cough and chest pain at PAT appointment   All instructions explained to the patient, with a verbal understanding of the material. Patient agrees to go over the instructions while at home for a better understanding. Patient also instructed to self quarantine after being tested for COVID-19. The opportunity to ask questions was provided.

## 2021-11-21 LAB — URINE CULTURE: Culture: NO GROWTH

## 2021-11-29 ENCOUNTER — Inpatient Hospital Stay (HOSPITAL_COMMUNITY)
Admission: AD | Admit: 2021-11-29 | Discharge: 2021-12-03 | DRG: 470 | Disposition: A | Discharge status: Skilled Nursing Facility | Payer: Medicare Other | Attending: Orthopedic Surgery | Admitting: Orthopedic Surgery

## 2021-11-29 ENCOUNTER — Encounter (HOSPITAL_COMMUNITY)
Admission: AD | Disposition: A | Discharge status: Skilled Nursing Facility | Payer: Self-pay | Source: Home / Self Care | Attending: Orthopedic Surgery

## 2021-11-29 ENCOUNTER — Other Ambulatory Visit: Payer: Self-pay

## 2021-11-29 ENCOUNTER — Ambulatory Visit (HOSPITAL_COMMUNITY): Payer: Medicare Other | Admitting: Anesthesiology

## 2021-11-29 DIAGNOSIS — Z96652 Presence of left artificial knee joint: Secondary | ICD-10-CM | POA: Diagnosis present

## 2021-11-29 DIAGNOSIS — Z800 Family history of malignant neoplasm of digestive organs: Secondary | ICD-10-CM

## 2021-11-29 DIAGNOSIS — Z91013 Allergy to seafood: Secondary | ICD-10-CM

## 2021-11-29 DIAGNOSIS — M1711 Unilateral primary osteoarthritis, right knee: Secondary | ICD-10-CM

## 2021-11-29 DIAGNOSIS — Z8249 Family history of ischemic heart disease and other diseases of the circulatory system: Secondary | ICD-10-CM

## 2021-11-29 DIAGNOSIS — Z833 Family history of diabetes mellitus: Secondary | ICD-10-CM

## 2021-11-29 DIAGNOSIS — F419 Anxiety disorder, unspecified: Secondary | ICD-10-CM

## 2021-11-29 DIAGNOSIS — Z880 Allergy status to penicillin: Secondary | ICD-10-CM

## 2021-11-29 DIAGNOSIS — E739 Lactose intolerance, unspecified: Secondary | ICD-10-CM | POA: Diagnosis present

## 2021-11-29 DIAGNOSIS — Z885 Allergy status to narcotic agent status: Secondary | ICD-10-CM

## 2021-11-29 DIAGNOSIS — D563 Thalassemia minor: Secondary | ICD-10-CM | POA: Diagnosis present

## 2021-11-29 DIAGNOSIS — G4730 Sleep apnea, unspecified: Secondary | ICD-10-CM | POA: Diagnosis not present

## 2021-11-29 DIAGNOSIS — Z888 Allergy status to other drugs, medicaments and biological substances status: Secondary | ICD-10-CM

## 2021-11-29 DIAGNOSIS — I10 Essential (primary) hypertension: Secondary | ICD-10-CM | POA: Diagnosis not present

## 2021-11-29 DIAGNOSIS — F319 Bipolar disorder, unspecified: Secondary | ICD-10-CM | POA: Diagnosis present

## 2021-11-29 DIAGNOSIS — M1710 Unilateral primary osteoarthritis, unspecified knee: Secondary | ICD-10-CM | POA: Diagnosis present

## 2021-11-29 DIAGNOSIS — Z6839 Body mass index (BMI) 39.0-39.9, adult: Secondary | ICD-10-CM

## 2021-11-29 DIAGNOSIS — Z90710 Acquired absence of both cervix and uterus: Secondary | ICD-10-CM

## 2021-11-29 DIAGNOSIS — Z01818 Encounter for other preprocedural examination: Principal | ICD-10-CM

## 2021-11-29 DIAGNOSIS — Z79899 Other long term (current) drug therapy: Secondary | ICD-10-CM

## 2021-11-29 DIAGNOSIS — F4310 Post-traumatic stress disorder, unspecified: Secondary | ICD-10-CM | POA: Diagnosis present

## 2021-11-29 DIAGNOSIS — Z9884 Bariatric surgery status: Secondary | ICD-10-CM

## 2021-11-29 DIAGNOSIS — Z87891 Personal history of nicotine dependence: Secondary | ICD-10-CM

## 2021-11-29 DIAGNOSIS — Z91018 Allergy to other foods: Secondary | ICD-10-CM

## 2021-11-29 DIAGNOSIS — K219 Gastro-esophageal reflux disease without esophagitis: Secondary | ICD-10-CM | POA: Diagnosis present

## 2021-11-29 HISTORY — PX: TOTAL KNEE ARTHROPLASTY: SHX125

## 2021-11-29 SURGERY — ARTHROPLASTY, KNEE, TOTAL
Anesthesia: Spinal | Site: Knee | Laterality: Right

## 2021-11-29 MED ORDER — VITAMIN D 25 MCG (1000 UNIT) PO TABS
2000.0000 [IU] | ORAL_TABLET | Freq: Every day | ORAL | Status: DC
  Administered 2021-11-30 – 2021-12-03 (×4): 2000 [IU] via ORAL
  Filled 2021-11-29 (×8): qty 2

## 2021-11-29 MED ORDER — HYDROCHLOROTHIAZIDE 25 MG PO TABS
25.0000 mg | ORAL_TABLET | Freq: Every day | ORAL | Status: DC
  Administered 2021-11-29 – 2021-12-03 (×5): 25 mg via ORAL
  Filled 2021-11-29 (×5): qty 1

## 2021-11-29 MED ORDER — ONDANSETRON HCL 4 MG/2ML IJ SOLN: 4.0000 mg | Freq: Once | INTRAMUSCULAR | Status: DC | PRN

## 2021-11-29 MED ORDER — VANCOMYCIN HCL 1.5 G IV SOLR
1500.0000 mg | INTRAVENOUS | Status: AC
  Administered 2021-11-29: 1500 mg via INTRAVENOUS
  Filled 2021-11-29: qty 30

## 2021-11-29 MED ORDER — IRRISEPT - 450ML BOTTLE WITH 0.05% CHG IN STERILE WATER, USP 99.95% OPTIME
TOPICAL | Status: DC | PRN
  Administered 2021-11-29: 450 mL

## 2021-11-29 MED ORDER — ONDANSETRON HCL 4 MG/2ML IJ SOLN
INTRAMUSCULAR | Status: AC
  Filled 2021-11-29: qty 2

## 2021-11-29 MED ORDER — ROPIVACAINE HCL 7.5 MG/ML IJ SOLN
INTRAMUSCULAR | Status: DC | PRN
  Administered 2021-11-29: 20 mL via PERINEURAL

## 2021-11-29 MED ORDER — LOSARTAN POTASSIUM-HCTZ 100-25 MG PO TABS: 1.0000 | ORAL_TABLET | Freq: Every day | ORAL | Status: DC

## 2021-11-29 MED ORDER — ADULT MULTIVITAMIN W/MINERALS CH
1.0000 | ORAL_TABLET | Freq: Every day | ORAL | Status: DC
  Administered 2021-11-30 – 2021-12-03 (×4): 1 via ORAL
  Filled 2021-11-29 (×4): qty 1

## 2021-11-29 MED ORDER — DEXAMETHASONE SODIUM PHOSPHATE 10 MG/ML IJ SOLN
INTRAMUSCULAR | Status: AC
  Filled 2021-11-29: qty 1

## 2021-11-29 MED ORDER — FAMOTIDINE 20 MG PO TABS
20.0000 mg | ORAL_TABLET | Freq: Two times a day (BID) | ORAL | Status: DC
  Administered 2021-11-29 – 2021-12-03 (×8): 20 mg via ORAL
  Filled 2021-11-29 (×8): qty 1

## 2021-11-29 MED ORDER — METOCLOPRAMIDE HCL 5 MG PO TABS: 5.0000 mg | ORAL_TABLET | Freq: Three times a day (TID) | ORAL | Status: DC | PRN

## 2021-11-29 MED ORDER — PHENYLEPHRINE 80 MCG/ML (10ML) SYRINGE FOR IV PUSH (FOR BLOOD PRESSURE SUPPORT)
PREFILLED_SYRINGE | INTRAVENOUS | Status: DC | PRN
  Administered 2021-11-29: 30 ug via INTRAVENOUS

## 2021-11-29 MED ORDER — 0.9 % SODIUM CHLORIDE (POUR BTL) OPTIME
TOPICAL | Status: DC | PRN
  Administered 2021-11-29: 4000 mL

## 2021-11-29 MED ORDER — CEFAZOLIN SODIUM-DEXTROSE 2-3 GM-%(50ML) IV SOLR
INTRAVENOUS | Status: DC | PRN
  Administered 2021-11-29: 2 g via INTRAVENOUS

## 2021-11-29 MED ORDER — PROPOFOL 10 MG/ML IV BOLUS
INTRAVENOUS | Status: DC | PRN
  Administered 2021-11-29: 30 mg via INTRAVENOUS
  Administered 2021-11-29: 100 ug/kg/min via INTRAVENOUS

## 2021-11-29 MED ORDER — VANCOMYCIN HCL IN DEXTROSE 1-5 GM/200ML-% IV SOLN
1000.0000 mg | Freq: Two times a day (BID) | INTRAVENOUS | Status: AC
  Administered 2021-11-29: 1000 mg via INTRAVENOUS
  Filled 2021-11-29: qty 200

## 2021-11-29 MED ORDER — PHENYLEPHRINE 80 MCG/ML (10ML) SYRINGE FOR IV PUSH (FOR BLOOD PRESSURE SUPPORT)
PREFILLED_SYRINGE | INTRAVENOUS | Status: AC
  Filled 2021-11-29: qty 10

## 2021-11-29 MED ORDER — GABAPENTIN 300 MG PO CAPS
300.0000 mg | ORAL_CAPSULE | Freq: Three times a day (TID) | ORAL | Status: DC | PRN
  Administered 2021-11-30 – 2021-12-01 (×4): 300 mg via ORAL
  Filled 2021-11-29 (×4): qty 1

## 2021-11-29 MED ORDER — OXYCODONE HCL 5 MG PO TABS: 5.0000 mg | ORAL_TABLET | Freq: Once | ORAL | Status: DC | PRN

## 2021-11-29 MED ORDER — MIDAZOLAM HCL 2 MG/2ML IJ SOLN
INTRAMUSCULAR | Status: AC
  Filled 2021-11-29: qty 2

## 2021-11-29 MED ORDER — TRANEXAMIC ACID-NACL 1000-0.7 MG/100ML-% IV SOLN
INTRAVENOUS | Status: AC
  Filled 2021-11-29: qty 100

## 2021-11-29 MED ORDER — PHENYLEPHRINE HCL-NACL 20-0.9 MG/250ML-% IV SOLN
INTRAVENOUS | Status: DC | PRN
  Administered 2021-11-29: 30 ug/min via INTRAVENOUS

## 2021-11-29 MED ORDER — BUPIVACAINE LIPOSOME 1.3 % IJ SUSP
INTRAMUSCULAR | Status: AC
  Filled 2021-11-29: qty 20

## 2021-11-29 MED ORDER — FENTANYL CITRATE (PF) 250 MCG/5ML IJ SOLN
INTRAMUSCULAR | Status: AC
  Filled 2021-11-29: qty 5

## 2021-11-29 MED ORDER — DEXMEDETOMIDINE HCL IN NACL 80 MCG/20ML IV SOLN
INTRAVENOUS | Status: AC
  Filled 2021-11-29: qty 20

## 2021-11-29 MED ORDER — LACTATED RINGERS IV SOLN: INTRAVENOUS | Status: DC

## 2021-11-29 MED ORDER — ONDANSETRON HCL 4 MG/2ML IJ SOLN
INTRAMUSCULAR | Status: DC | PRN
  Administered 2021-11-29: 4 mg via INTRAVENOUS

## 2021-11-29 MED ORDER — MORPHINE SULFATE (PF) 4 MG/ML IV SOLN
INTRAVENOUS | Status: AC
  Filled 2021-11-29: qty 2

## 2021-11-29 MED ORDER — MORPHINE SULFATE 4 MG/ML IJ SOLN
INTRAMUSCULAR | Status: DC | PRN
  Administered 2021-11-29: 13 mL

## 2021-11-29 MED ORDER — MONTELUKAST SODIUM 10 MG PO TABS
10.0000 mg | ORAL_TABLET | Freq: Every day | ORAL | Status: DC
  Administered 2021-11-30 – 2021-12-03 (×4): 10 mg via ORAL
  Filled 2021-11-29 (×4): qty 1

## 2021-11-29 MED ORDER — MAGNESIUM HYDROXIDE 400 MG/5ML PO SUSP: 30.0000 mL | Freq: Every day | ORAL | Status: DC | PRN

## 2021-11-29 MED ORDER — DEXAMETHASONE SODIUM PHOSPHATE 10 MG/ML IJ SOLN
INTRAMUSCULAR | Status: DC | PRN
  Administered 2021-11-29: 10 mg via INTRAVENOUS

## 2021-11-29 MED ORDER — LOSARTAN POTASSIUM 50 MG PO TABS
100.0000 mg | ORAL_TABLET | Freq: Every day | ORAL | Status: DC
  Administered 2021-11-29 – 2021-12-03 (×5): 100 mg via ORAL
  Filled 2021-11-29 (×5): qty 2

## 2021-11-29 MED ORDER — ORAL CARE MOUTH RINSE: 15.0000 mL | Freq: Once | OROMUCOSAL | Status: AC

## 2021-11-29 MED ORDER — POVIDONE-IODINE 10 % EX SWAB: 2.0000 | Freq: Once | CUTANEOUS | Status: DC

## 2021-11-29 MED ORDER — DEXMEDETOMIDINE HCL IN NACL 80 MCG/20ML IV SOLN
INTRAVENOUS | Status: DC | PRN
  Administered 2021-11-29: 8 ug via BUCCAL

## 2021-11-29 MED ORDER — FENTANYL CITRATE (PF) 100 MCG/2ML IJ SOLN
INTRAMUSCULAR | Status: AC
  Filled 2021-11-29: qty 2

## 2021-11-29 MED ORDER — VANCOMYCIN HCL 1000 MG IV SOLR
INTRAVENOUS | Status: DC | PRN
  Administered 2021-11-29: 1000 mg

## 2021-11-29 MED ORDER — HYDROMORPHONE HCL 1 MG/ML IJ SOLN
INTRAMUSCULAR | Status: AC
  Filled 2021-11-29: qty 1

## 2021-11-29 MED ORDER — POVIDONE-IODINE 7.5 % EX SOLN
Freq: Once | CUTANEOUS | Status: DC
  Filled 2021-11-29: qty 118

## 2021-11-29 MED ORDER — HYDROMORPHONE HCL 1 MG/ML IJ SOLN
0.5000 mg | INTRAMUSCULAR | Status: DC | PRN
  Administered 2021-11-29 – 2021-12-02 (×10): 1 mg via INTRAVENOUS
  Filled 2021-11-29 (×8): qty 1

## 2021-11-29 MED ORDER — LIDOCAINE 2% (20 MG/ML) 5 ML SYRINGE
INTRAMUSCULAR | Status: DC | PRN
  Administered 2021-11-29: 40 mg via INTRAVENOUS

## 2021-11-29 MED ORDER — DIPHENHYDRAMINE HCL 50 MG/ML IJ SOLN
INTRAMUSCULAR | Status: AC
  Filled 2021-11-29: qty 1

## 2021-11-29 MED ORDER — BUPIVACAINE IN DEXTROSE 0.75-8.25 % IT SOLN
INTRATHECAL | Status: DC | PRN
  Administered 2021-11-29: 1.8 mL via INTRATHECAL

## 2021-11-29 MED ORDER — TRANEXAMIC ACID 1000 MG/10ML IV SOLN
2000.0000 mg | INTRAVENOUS | Status: DC
  Filled 2021-11-29: qty 20

## 2021-11-29 MED ORDER — METOCLOPRAMIDE HCL 5 MG/ML IJ SOLN: 5.0000 mg | Freq: Three times a day (TID) | INTRAMUSCULAR | Status: DC | PRN

## 2021-11-29 MED ORDER — FENTANYL CITRATE (PF) 100 MCG/2ML IJ SOLN
25.0000 ug | INTRAMUSCULAR | Status: DC | PRN
  Administered 2021-11-29 (×2): 25 ug via INTRAVENOUS
  Administered 2021-11-29: 50 ug via INTRAVENOUS

## 2021-11-29 MED ORDER — IBUPROFEN 200 MG PO TABS: 600.0000 mg | ORAL_TABLET | Freq: Four times a day (QID) | ORAL | Status: DC | PRN

## 2021-11-29 MED ORDER — CEFAZOLIN SODIUM 1 G IJ SOLR
INTRAMUSCULAR | Status: AC
  Filled 2021-11-29: qty 20

## 2021-11-29 MED ORDER — CHLORHEXIDINE GLUCONATE 0.12 % MT SOLN: 15.0000 mL | Freq: Once | OROMUCOSAL | Status: AC

## 2021-11-29 MED ORDER — ALENDRONATE SODIUM 70 MG PO TABS: 70.0000 mg | ORAL_TABLET | ORAL | Status: DC

## 2021-11-29 MED ORDER — LORATADINE 10 MG PO TABS
10.0000 mg | ORAL_TABLET | Freq: Every day | ORAL | Status: DC
  Administered 2021-11-29 – 2021-12-03 (×5): 10 mg via ORAL
  Filled 2021-11-29 (×5): qty 1

## 2021-11-29 MED ORDER — VANCOMYCIN HCL 1000 MG IV SOLR
INTRAVENOUS | Status: AC
  Filled 2021-11-29: qty 20

## 2021-11-29 MED ORDER — FLUTICASONE PROPIONATE 50 MCG/ACT NA SUSP: 1.0000 | Freq: Three times a day (TID) | NASAL | Status: DC | PRN

## 2021-11-29 MED ORDER — RIVAROXABAN 10 MG PO TABS
10.0000 mg | ORAL_TABLET | Freq: Every day | ORAL | Status: DC
  Administered 2021-11-30 – 2021-12-03 (×4): 10 mg via ORAL
  Filled 2021-11-29 (×4): qty 1

## 2021-11-29 MED ORDER — ONDANSETRON HCL 4 MG/2ML IJ SOLN: 4.0000 mg | Freq: Four times a day (QID) | INTRAMUSCULAR | Status: DC | PRN

## 2021-11-29 MED ORDER — ALBUTEROL SULFATE (2.5 MG/3ML) 0.083% IN NEBU
2.5000 mg | INHALATION_SOLUTION | Freq: Two times a day (BID) | RESPIRATORY_TRACT | Status: DC | PRN
  Administered 2021-12-01 (×2): 2.5 mg via RESPIRATORY_TRACT
  Filled 2021-11-29 (×2): qty 3

## 2021-11-29 MED ORDER — OXYCODONE HCL 5 MG/5ML PO SOLN: 5.0000 mg | Freq: Once | ORAL | Status: DC | PRN

## 2021-11-29 MED ORDER — CHLORHEXIDINE GLUCONATE 0.12 % MT SOLN
OROMUCOSAL | Status: AC
  Administered 2021-11-29: 15 mL via OROMUCOSAL
  Filled 2021-11-29: qty 15

## 2021-11-29 MED ORDER — SODIUM CHLORIDE 0.9 % IV SOLN: INTRAVENOUS | Status: AC

## 2021-11-29 MED ORDER — BUPIVACAINE HCL (PF) 0.25 % IJ SOLN
INTRAMUSCULAR | Status: AC
  Filled 2021-11-29: qty 30

## 2021-11-29 MED ORDER — TRANEXAMIC ACID 1000 MG/10ML IV SOLN
INTRAVENOUS | Status: DC | PRN
  Administered 2021-11-29: 2000 mg via TOPICAL

## 2021-11-29 MED ORDER — SODIUM CHLORIDE 0.9 % IR SOLN
Status: DC | PRN
  Administered 2021-11-29: 3000 mL

## 2021-11-29 MED ORDER — OXYCODONE HCL 5 MG PO TABS
5.0000 mg | ORAL_TABLET | ORAL | Status: DC | PRN
  Administered 2021-11-29: 10 mg via ORAL
  Administered 2021-11-30: 5 mg via ORAL
  Administered 2021-11-30 (×3): 10 mg via ORAL
  Administered 2021-12-01: 5 mg via ORAL
  Administered 2021-12-01 – 2021-12-03 (×9): 10 mg via ORAL
  Filled 2021-11-29 (×2): qty 2
  Filled 2021-11-29: qty 1
  Filled 2021-11-29 (×8): qty 2
  Filled 2021-11-29: qty 1
  Filled 2021-11-29 (×4): qty 2

## 2021-11-29 MED ORDER — SODIUM CHLORIDE (PF) 0.9 % IJ SOLN
INTRAMUSCULAR | Status: DC | PRN
  Administered 2021-11-29: 60 mL

## 2021-11-29 MED ORDER — TRANEXAMIC ACID-NACL 1000-0.7 MG/100ML-% IV SOLN
1000.0000 mg | INTRAVENOUS | Status: AC
  Administered 2021-11-29: 1000 mg via INTRAVENOUS

## 2021-11-29 MED ORDER — HYDROXYZINE HCL 10 MG PO TABS
10.0000 mg | ORAL_TABLET | Freq: Every day | ORAL | Status: DC
  Administered 2021-11-30 – 2021-12-03 (×4): 10 mg via ORAL
  Filled 2021-11-29 (×4): qty 1

## 2021-11-29 MED ORDER — DIPHENHYDRAMINE HCL 50 MG/ML IJ SOLN
INTRAMUSCULAR | Status: DC | PRN
  Administered 2021-11-29: 12.5 mg via INTRAVENOUS

## 2021-11-29 MED ORDER — DOCUSATE SODIUM 100 MG PO CAPS
100.0000 mg | ORAL_CAPSULE | Freq: Two times a day (BID) | ORAL | Status: DC
  Administered 2021-11-29 – 2021-12-02 (×6): 100 mg via ORAL
  Filled 2021-11-29 (×8): qty 1

## 2021-11-29 MED ORDER — BUDESONIDE 0.25 MG/2ML IN SUSP
0.2500 mg | Freq: Two times a day (BID) | RESPIRATORY_TRACT | Status: DC
  Administered 2021-11-30 – 2021-12-03 (×8): 0.25 mg via RESPIRATORY_TRACT
  Filled 2021-11-29 (×8): qty 2

## 2021-11-29 MED ORDER — ACETAMINOPHEN 325 MG PO TABS
325.0000 mg | ORAL_TABLET | Freq: Four times a day (QID) | ORAL | Status: DC | PRN
  Filled 2021-11-29: qty 2

## 2021-11-29 MED ORDER — ONDANSETRON HCL 4 MG PO TABS: 4.0000 mg | ORAL_TABLET | Freq: Four times a day (QID) | ORAL | Status: DC | PRN

## 2021-11-29 MED ORDER — METHOCARBAMOL 500 MG PO TABS
500.0000 mg | ORAL_TABLET | Freq: Every day | ORAL | Status: DC | PRN
  Administered 2021-11-30 – 2021-12-03 (×4): 500 mg via ORAL
  Filled 2021-11-29 (×4): qty 1

## 2021-11-29 SURGICAL SUPPLY — 80 items
BAG COUNTER SPONGE SURGICOUNT (BAG) ×1 IMPLANT
BAG DECANTER FOR FLEXI CONT (MISCELLANEOUS) ×1 IMPLANT
BANDAGE ESMARK 6X9 LF (GAUZE/BANDAGES/DRESSINGS) ×1 IMPLANT
BLADE SAG 18X100X1.27 (BLADE) ×1 IMPLANT
BLADE SAGITTAL (BLADE) ×1
BLADE SAW THK.89X75X18XSGTL (BLADE) ×1 IMPLANT
BNDG COHESIVE 6X5 TAN STRL LF (GAUZE/BANDAGES/DRESSINGS) ×1 IMPLANT
BNDG ELASTIC 6X10 VLCR STRL LF (GAUZE/BANDAGES/DRESSINGS) IMPLANT
BNDG ELASTIC 6X15 VLCR STRL LF (GAUZE/BANDAGES/DRESSINGS) ×1 IMPLANT
BNDG ESMARK 6X9 LF (GAUZE/BANDAGES/DRESSINGS) ×1
BOWL SMART MIX CTS (DISPOSABLE) IMPLANT
CNTNR URN SCR LID CUP LEK RST (MISCELLANEOUS) ×1 IMPLANT
COMP FEMORAL CMTL TRIATH SZ2 (Joint) ×1 IMPLANT
COMPONENT FEMRL CMTL TRITH SZ2 (Joint) IMPLANT
CONT SPEC 4OZ STRL OR WHT (MISCELLANEOUS) ×1
COVER SURGICAL LIGHT HANDLE (MISCELLANEOUS) ×1 IMPLANT
CUFF TOURN SGL QUICK 34 (TOURNIQUET CUFF) ×1
CUFF TOURN SGL QUICK 42 (TOURNIQUET CUFF) IMPLANT
CUFF TRNQT CYL 34X4.125X (TOURNIQUET CUFF) ×1 IMPLANT
DRAPE INCISE IOBAN 66X45 STRL (DRAPES) IMPLANT
DRAPE ORTHO SPLIT 77X108 STRL (DRAPES) ×3
DRAPE SURG ORHT 6 SPLT 77X108 (DRAPES) ×3 IMPLANT
DRAPE U-SHAPE 47X51 STRL (DRAPES) ×1 IMPLANT
DRSG AQUACEL AG ADV 3.5X14 (GAUZE/BANDAGES/DRESSINGS) IMPLANT
DURAPREP 26ML APPLICATOR (WOUND CARE) ×2 IMPLANT
ELECT CAUTERY BLADE 6.4 (BLADE) ×1 IMPLANT
ELECT REM PT RETURN 9FT ADLT (ELECTROSURGICAL) ×1
ELECTRODE REM PT RTRN 9FT ADLT (ELECTROSURGICAL) ×1 IMPLANT
GAUZE SPONGE 4X4 12PLY STRL (GAUZE/BANDAGES/DRESSINGS) ×1 IMPLANT
GLOVE BIOGEL PI IND STRL 7.0 (GLOVE) ×1 IMPLANT
GLOVE BIOGEL PI IND STRL 8 (GLOVE) ×1 IMPLANT
GLOVE ECLIPSE 7.0 STRL STRAW (GLOVE) ×1 IMPLANT
GLOVE ECLIPSE 8.0 STRL XLNG CF (GLOVE) ×1 IMPLANT
GLOVE SURG ENC MOIS LTX SZ6.5 (GLOVE) ×3 IMPLANT
GOWN STRL REUS W/ TWL LRG LVL3 (GOWN DISPOSABLE) ×3 IMPLANT
GOWN STRL REUS W/TWL LRG LVL3 (GOWN DISPOSABLE) ×3
HANDPIECE INTERPULSE COAX TIP (DISPOSABLE) ×1
HOOD PEEL AWAY FLYTE STAYCOOL (MISCELLANEOUS) ×3 IMPLANT
IMMOBILIZER KNEE 20 (SOFTGOODS)
IMMOBILIZER KNEE 20 THIGH 36 (SOFTGOODS) IMPLANT
IMMOBILIZER KNEE 22 UNIV (SOFTGOODS) IMPLANT
IMMOBILIZER KNEE 24 THIGH 36 (MISCELLANEOUS) IMPLANT
IMMOBILIZER KNEE 24 UNIV (MISCELLANEOUS)
INSERT TIB BEAR TRIATH SZ3 13 (Insert) IMPLANT
JET LAVAGE IRRISEPT WOUND (IRRIGATION / IRRIGATOR) ×1
KIT BASIN OR (CUSTOM PROCEDURE TRAY) ×1 IMPLANT
KIT TURNOVER KIT B (KITS) ×1 IMPLANT
KNEE PATELLA ASYMMETRIC 10X35 (Knees) IMPLANT
KNEE TIBIAL COMPONENT SZ3 (Knees) IMPLANT
LAVAGE JET IRRISEPT WOUND (IRRIGATION / IRRIGATOR) IMPLANT
MANIFOLD NEPTUNE II (INSTRUMENTS) ×1 IMPLANT
NDL 22X1.5 STRL (OR ONLY) (MISCELLANEOUS) ×2 IMPLANT
NDL SPNL 18GX3.5 QUINCKE PK (NEEDLE) ×1 IMPLANT
NEEDLE 22X1.5 STRL (OR ONLY) (MISCELLANEOUS) ×2 IMPLANT
NEEDLE SPNL 18GX3.5 QUINCKE PK (NEEDLE) ×1 IMPLANT
NS IRRIG 1000ML POUR BTL (IV SOLUTION) ×2 IMPLANT
PACK TOTAL JOINT (CUSTOM PROCEDURE TRAY) ×1 IMPLANT
PAD ARMBOARD 7.5X6 YLW CONV (MISCELLANEOUS) ×2 IMPLANT
PAD CAST 4YDX4 CTTN HI CHSV (CAST SUPPLIES) ×1 IMPLANT
PADDING CAST COTTON 4X4 STRL (CAST SUPPLIES) ×1
PADDING CAST COTTON 6X4 STRL (CAST SUPPLIES) ×1 IMPLANT
PIN FLUTED HEDLESS FIX 3.5X1/8 (PIN) IMPLANT
SET HNDPC FAN SPRY TIP SCT (DISPOSABLE) ×1 IMPLANT
SPIKE FLUID TRANSFER (MISCELLANEOUS) ×1 IMPLANT
STRIP CLOSURE SKIN 1/2X4 (GAUZE/BANDAGES/DRESSINGS) ×2 IMPLANT
SUCTION FRAZIER HANDLE 10FR (MISCELLANEOUS) ×1
SUCTION TUBE FRAZIER 10FR DISP (MISCELLANEOUS) ×1 IMPLANT
SUT MNCRL AB 3-0 PS2 18 (SUTURE) ×1 IMPLANT
SUT VIC AB 0 CT1 27 (SUTURE) ×5
SUT VIC AB 0 CT1 27XBRD ANBCTR (SUTURE) ×3 IMPLANT
SUT VIC AB 1 CT1 36 (SUTURE) ×5 IMPLANT
SUT VIC AB 2-0 CT1 27 (SUTURE) ×6
SUT VIC AB 2-0 CT1 TAPERPNT 27 (SUTURE) ×4 IMPLANT
SYR 30ML LL (SYRINGE) ×3 IMPLANT
SYR TB 1ML LUER SLIP (SYRINGE) ×1 IMPLANT
TOWEL GREEN STERILE (TOWEL DISPOSABLE) ×2 IMPLANT
TOWEL GREEN STERILE FF (TOWEL DISPOSABLE) ×2 IMPLANT
TRAY CATH 16FR W/PLASTIC CATH (SET/KITS/TRAYS/PACK) IMPLANT
WATER STERILE IRR 1000ML POUR (IV SOLUTION) IMPLANT
YANKAUER SUCT BULB TIP NO VENT (SUCTIONS) ×1 IMPLANT

## 2021-11-29 NOTE — Anesthesia Procedure Notes (Signed)
Spinal  Patient location during procedure: OR Start time: 11/29/2021 11:15 AM End time: 11/29/2021 11:19 AM Reason for block: surgical anesthesia Staffing Performed: anesthesiologist  Anesthesiologist: Audry Pili, MD Performed by: Audry Pili, MD Authorized by: Audry Pili, MD   Preanesthetic Checklist Completed: patient identified, IV checked, risks and benefits discussed, surgical consent, monitors and equipment checked, pre-op evaluation and timeout performed Spinal Block Patient position: sitting Prep: DuraPrep Patient monitoring: heart rate, cardiac monitor, continuous pulse ox and blood pressure Approach: midline Location: L3-4 Injection technique: single-shot Needle Needle type: Quincke  Needle gauge: 22 G Additional Notes Consent was obtained prior to the procedure with all questions answered and concerns addressed. Risks including, but not limited to, bleeding, infection, nerve damage, paralysis, failed block, inadequate analgesia, allergic reaction, high spinal, itching, and headache were discussed and the patient wished to proceed. Functioning IV was confirmed and monitors were applied. Sterile prep and drape, including hand hygiene, mask, and sterile gloves were used. The patient was positioned and the spine was prepped. The skin was anesthetized with lidocaine. Free flow of clear CSF was obtained prior to injecting local anesthetic into the CSF. The spinal needle aspirated freely following injection. The needle was carefully withdrawn. The patient tolerated the procedure well.   Cheryl Don, MD

## 2021-11-29 NOTE — Progress Notes (Signed)
Orthopedic Tech Progress Note Patient Details:  Cheryl Prince 1955-05-27 [id number]  Ortho Devices Type of Ortho Device: Bone foam zero knee Ortho Device/Splint Location: at bedside Ortho Device/Splint Interventions: Ordered   Post Interventions Instructions Provided: Care of device, Adjustment of device  [name] 11/29/2021, 6:33 PM

## 2021-11-29 NOTE — Anesthesia Procedure Notes (Signed)
Anesthesia Regional Block: Adductor canal block   Pre-Anesthetic Checklist: , timeout performed,  Correct Patient, Correct Site, Correct Laterality,  Correct Procedure, Correct Position, site marked,  Risks and benefits discussed,  Surgical consent,  Pre-op evaluation,  At surgeon's request and post-op pain management  Laterality: Right  Prep: chloraprep       Needles:  Injection technique: Single-shot  Needle Type: Echogenic Needle     Needle Length: 10cm  Needle Gauge: 21     Additional Needles:   Narrative:  Start time: 11/29/2021 10:34 AM End time: 11/29/2021 10:37 AM Injection made incrementally with aspirations every 5 mL.  Performed by: Personally  Anesthesiologist: Audry Pili, MD  Additional Notes: No pain on injection. No increased resistance to injection. Injection made in 5cc increments. Good needle visualization. Patient tolerated the procedure well.

## 2021-11-29 NOTE — Transfer of Care (Signed)
Immediate Anesthesia Transfer of Care Note  Patient: Cheryl Prince  Procedure(s) Performed: RIGHT TOTAL KNEE ARTHROPLASTY (Right: Knee)  Patient Location: PACU  Anesthesia Type:Spinal  Level of Consciousness: awake, alert  and oriented  Airway & Oxygen Therapy: Patient Spontanous Breathing  Post-op Assessment: Report given to RN and Post -op Vital signs reviewed and stable  Post vital signs: Reviewed and stable  Last Vitals:  Vitals Value Taken Time  BP 152/81 11/29/21 1403  Temp    Pulse 64 11/29/21 1406  Resp 20 11/29/21 1408  SpO2 96 % 11/29/21 1406  Vitals shown include unvalidated device data.  Last Pain:  Vitals:   11/29/21 0915  TempSrc:   PainSc: 0-No pain         Complications: No notable events documented.

## 2021-11-29 NOTE — Op Note (Signed)
NAME: Cheryl Prince, Cheryl Prince MEDICAL RECORD NO: [id number] ACCOUNT NO: [id number] DATE OF BIRTH: 06/23/1955 FACILITY: [facility] LOCATION: [facility] PHYSICIAN: Graylin Shiver. August Saucer, MD  Operative Report   DATE OF PROCEDURE: 11/29/2021   PREOPERATIVE DIAGNOSIS:  Right knee arthritis.  POSTOPERATIVE DIAGNOSIS:  Right knee arthritis.  PROCEDURE:  Right total knee replacement using Stryker press-fit components, , size 2 femur, posterior cruciate retaining size 3 tibia, 13 mm polyethylene insert, 35 mm 3-peg press-fit patella.  SURGEON:  Graylin Shiver. August Saucer, MD  ASSISTANT:  Karenann Cai.  INDICATIONS:  The patient is a 66 year old patient with right knee arthritis, who presents for operative management after explanation of risks, benefits and good result with left total knee replacement.  DESCRIPTION OF PROCEDURE:  The patient was brought to the operating room where spinal anesthetic was induced.  Preoperative antibiotics administered.  Timeout was called.  Right knee was prescrubbed with alcohol and Betadine, allowed to air dry, prepped  with DuraPrep solution and draped in sterile manner.  Ioban used to cover the operative field.  The leg was elevated and exsanguinated with the Esmarch wrap.  Tourniquet was inflated.  Timeout was called.  Anterior approach to the knee was made.  Skin  and subcutaneous tissue sharply divided.  IrriSept solution utilized after the incision.  Median parapatellar arthrotomy made and marked with #1 Vicryl suture, IrriSept solution again utilized after the arthrotomy patella was everted.  Fat pad partially  removed.  Medial soft tissue dissection on the proximal tibia performed proportional to the amount of the patient's moderate varus deformity preoperatively.  A lateral patellofemoral ligament was released.  Soft tissue removed from the anterior distal  femur.  Osteophytes were removed.  Anterior horn lateral meniscus removed along with the ACL. At this time the knee was flexed and  intramedullary alignment was used to make a cut perpendicular to the mechanical axis setting 9 mm off the least affected  lateral tibial plateau.  At this time, an 8 mm cut was made off the distal femur using intramedullary alignment.  Femur was sized to a size 2.  Spacer block placement demonstrated 12 spacer block with good collateral ligament stability in full extension  and good alignment.  The femur was sized to a size 2 tibia sized to a size 3.  Anterior, posterior chamfer cuts were made in 3 degrees of external rotation parallel to the epicondylar axis. The tibial tray was then placed in the appropriate rotation  aligned with the medial third of the tibial tubercle.  Trial components in position, the patient had good stability in extension with 13 spacer.  The osteophytes were removed from the posteromedial femoral condyle.  Patella was cut down from 20 to 10 mm  and a 3-peg patellar trial was placed.  With trial components in position the patient achieved full extension, full flexion, no liftoff, and excellent patellar tracking using no thumbs technique and very good stability to varus and valgus stress at 0, 30  and 90 degrees, trial components were removed.  Tibia was keel punched prior to removal of the tibial tray.  Thorough irrigation was then performed with 3 L of pulsatile irrigation.  Capsule anesthetized using a combination of Marcaine, Exparel and  saline.  TXA sponge was then allowed to sit along with IrriSept solution for 3 minutes in the knee.  These were removed.  IrriSept solution then used to irrigate the canal.  Vancomycin powder placed on the tibial side.  The components were then tapped  into position  with good bone contact achieved.  Spacer was placed.  Patella was placed.  The patient had very good stability and same stability and range of motion parameters were maintained.  Tourniquet released at this time with bleeding points  encountered controlled using electrocautery.   Pouring irrigation utilized x4 L.  Arthrotomy was closed over bolster using #1 Vicryl suture.  Prior to final arthrotomy closure the knee was irrigated again with IrriSept and vancomycin powder was placed.   Final arthrotomy closure was then achieved.  The knee was then injected with solution of Marcaine, morphine, clonidine.  The incision was then closed using a combination of 0 Vicryl suture, 2-0 Vicryl suture, and 3-0 Monocryl with Steri-Strips.  Aquacel  dressing applied along with Ace wrap, iceman and knee immobilizer.  The patient tolerated the procedure well without immediate complications, transferred to the recovery room in stable condition.     SUJ D: 11/29/2021 2:19:30 pm T: 11/29/2021 9:58:00 pm  JOB: [id number]/ [id number]

## 2021-11-29 NOTE — Brief Op Note (Signed)
   11/29/2021  2:11 PM  PATIENT:  Cheryl Prince  66 y.o. female  PRE-OPERATIVE DIAGNOSIS:  right knee osteoarthritis  POST-OPERATIVE DIAGNOSIS:  right knee osteoarthritis  PROCEDURE:  Procedure(s): RIGHT TOTAL KNEE ARTHROPLASTY  SURGEON:  Surgeon(s): Meredith Pel, MD  ASSISTANT: Emmaline Kluver green RNFA  ANESTHESIA:   Spinal  EBL: 75 ml    Total I/O In: 850 [I.V.:700; IV Piggyback:150] Out: 2683 [Urine:1200; Blood:75]  BLOOD ADMINISTERED: none  DRAINS: none   LOCAL MEDICATIONS USED: Marcaine morphine Exparel vancomycin powder  SPECIMEN:  No Specimen  COUNTS:  YES  TOURNIQUET:   Total Tourniquet Time Documented: Thigh (Right) - 75 minutes Total: Thigh (Right) - 75 minutes  Test dose of Ancef no problem DICTATION: .Other Dictation: Dictation Number [id number]  PLAN OF CARE: Admit to inpatient   PATIENT DISPOSITION:  PACU - hemodynamically stable

## 2021-11-29 NOTE — Anesthesia Postprocedure Evaluation (Signed)
Anesthesia Post Note  Patient: Cheryl Prince  Procedure(s) Performed: RIGHT TOTAL KNEE ARTHROPLASTY (Right: Knee)     Patient location during evaluation: PACU Anesthesia Type: Spinal Level of consciousness: awake and alert Pain management: pain level controlled Vital Signs Assessment: post-procedure vital signs reviewed and stable Respiratory status: spontaneous breathing and respiratory function stable Cardiovascular status: blood pressure returned to baseline and stable Postop Assessment: spinal receding and no apparent nausea or vomiting Anesthetic complications: no   No notable events documented.  Last Vitals:  Vitals:   11/29/21 1415 11/29/21 1430  BP: (!) 154/91 (!) 152/88  Pulse: 67 63  Resp: (!) 24 (!) 21  Temp:    SpO2: 97% 97%    Last Pain:  Vitals:   11/29/21 1400  TempSrc:   PainSc: [name]

## 2021-11-29 NOTE — Anesthesia Preprocedure Evaluation (Addendum)
Anesthesia Evaluation  Patient identified by MRN, date of birth, ID band Patient awake    Reviewed: Allergy & Precautions, NPO status , Patient's Chart, lab work & pertinent test results  History of Anesthesia Complications Negative for: history of anesthetic complications  Airway Mallampati: II  TM Distance: >3 FB Neck ROM: Full    Dental  (+) Partial Lower, Upper Dentures   Pulmonary asthma , sleep apnea , former smoker,    Pulmonary exam normal        Cardiovascular hypertension, Pt. on medications Normal cardiovascular exam     Neuro/Psych PSYCHIATRIC DISORDERS Anxiety Bipolar Disorder negative neurological ROS     GI/Hepatic Neg liver ROS, GERD  Medicated and Controlled, S/p gastric bypass    Endo/Other  Morbid obesity Goiter Pre-DM   Renal/GU negative Renal ROS     Musculoskeletal  (+) Arthritis , Osteoarthritis,    Abdominal (+) + obese,   Peds  Hematology  (+) Blood dyscrasia, anemia ,   Anesthesia Other Findings   Reproductive/Obstetrics                            Anesthesia Physical Anesthesia Plan  ASA: 3  Anesthesia Plan: Spinal   Post-op Pain Management: Regional block*   Induction:   PONV Risk Score and Plan: 2 and Treatment may vary due to age or medical condition and Propofol infusion  Airway Management Planned: Natural Airway and Simple Face Mask  Additional Equipment: None  Intra-op Plan:   Post-operative Plan:   Informed Consent: I have reviewed the patients History and Physical, chart, labs and discussed the procedure including the risks, benefits and alternatives for the proposed anesthesia with the patient or authorized representative who has indicated his/her understanding and acceptance.       Plan Discussed with: CRNA and Anesthesiologist  Anesthesia Plan Comments: (Labs reviewed, platelets acceptable. Discussed risks and benefits of  spinal, including spinal/epidural hematoma, infection, failed block, and PDPH. Patient expressed understanding and wished to proceed. )        Anesthesia Quick Evaluation

## 2021-11-29 NOTE — H&P (Signed)
TOTAL KNEE ADMISSION H&P  Patient is being admitted for right total knee arthroplasty.  Subjective:  Chief Complaint:right knee pain.  HPI: Cheryl Prince, 66 y.o. female, has a history of pain and functional disability in the right knee due to arthritis and has failed non-surgical conservative treatments for greater than 12 weeks to includeNSAID's and/or analgesics, corticosteriod injections, use of assistive devices, weight reduction as appropriate, and activity modification.  Onset of symptoms was gradual, starting 8 years ago with gradually worsening course since that time. The patient noted no past surgery on the right knee(s).  Patient currently rates pain in the right knee(s) at 8 out of 10 with activity. Patient has night pain, worsening of pain with activity and weight bearing, pain that interferes with activities of daily living, pain with passive range of motion, crepitus, and joint swelling.  Patient has evidence of subchondral sclerosis and joint space narrowing by imaging studies. This patient has had  a good result with her left total knee replacement done several years ago.  Does have a positive family history on the mother side of DVT but she has no personal history of DVT. Cheryl Prince There is no active infection.  Patient Active Problem List   Diagnosis Date Noted   Bipolar I disorder ([place]) 07/26/2020   Right hip pain 07/26/2020   Bronchitis 07/26/2020   Allergies 07/26/2020   GERD (gastroesophageal reflux disease) 07/26/2020   At risk for decreased bone density 07/26/2020   PTSD (post-traumatic stress disorder) 05/30/2016   Alpha thalassemia trait 05/30/2016   Hypertension 05/30/2016   Rotator cuff tendonitis, left 05/30/2016   Osteoarthritis of left knee 05/30/2016   Carpal tunnel syndrome 05/30/2016   Prediabetes 05/30/2016   Onychomycosis 05/30/2016   Past Medical History:  Diagnosis Date   Allergy    Anemia    Anxiety    Arthritis    Asthma    borderline  bronchitis   Bronchitis    GERD (gastroesophageal reflux disease)    Goiter, toxic diffuse 05/30/2016   Hypertension    S/P tonsillectomy 05/30/2016   Sleep apnea    history of, MD tookpt. off machine 7 yrs, ago    Past Surgical History:  Procedure Laterality Date   ABDOMINAL HYSTERECTOMY     adnoides     BREAST SURGERY     CHOLECYSTECTOMY     COLONOSCOPY     DILATION AND CURETTAGE OF UTERUS     GASTRIC BYPASS  1998   IUD REMOVAL     TONSILLECTOMY     TOTAL KNEE ARTHROPLASTY Left 09/22/2018   TOTAL KNEE ARTHROPLASTY Left 09/22/2018   Procedure: LEFT TOTAL KNEE ARTHROPLASTY;  Surgeon: Meredith Pel, MD;  Location: [facility];  Service: Orthopedics;  Laterality: Left;    Current Facility-Administered Medications  Medication Dose Route Frequency Provider Last Rate Last Admin   fentaNYL (SUBLIMAZE) 100 MCG/2ML injection            lactated ringers infusion   Intravenous Continuous Effie Berkshire, MD 10 mL/hr at 11/29/21 0933 New Bag at 11/29/21 0933   midazolam (VERSED) 2 MG/2ML injection            povidone-iodine (BETADINE) 7.5 % scrub   Topical Once Magnant, Charles L, PA-C       povidone-iodine 10 % swab 2 Application  2 Application Topical Once Magnant, Charles L, PA-C       povidone-iodine 10 % swab 2 Application  2 Application Topical Once Magnant, Gerrianne Scale, PA-C  tranexamic acid (CYKLOKAPRON) 1000MG /133mL IVPB            tranexamic acid (CYKLOKAPRON) 2,000 mg in sodium chloride 0.9 % 50 mL Topical Application  123XX123 mg Topical To OR Marlou Sa, Tonna Corner, MD       tranexamic acid (CYKLOKAPRON) IVPB 1,000 mg  1,000 mg Intravenous To OR Magnant, Charles L, PA-C       Vancomycin (VANCOCIN) 1,500 mg in sodium chloride 0.9 % 500 mL IVPB  1,500 mg Intravenous On Call to [place], Tonna Corner, MD       Allergies  Allergen Reactions   Morphine And Related Diarrhea, Nausea And Vomiting and Swelling   Penicillins Swelling and Other (See Comments)    Comatose for 2 days    Pine  Shortness Of Breath   Shellfish Allergy Shortness Of Breath and Swelling   Lactose Intolerance (Gi) Other (See Comments)    GI upset   Miralax [Polyethylene Glycol] Itching   Nickel Other (See Comments)    Pt states she gets abscess from the earrings with nickel    Other Itching and Nausea And Vomiting    Mushrooms - feels high, and dizzy    Grapeseed Extract [Nutritional Supplements] Rash    grapes   Latex Itching and Rash    "IF" condom, it makes it painful   Red Dye Swelling and Rash    The dye for checking thyroid.  Red Cast Dye   Sulfites Hives and Rash    Tingling in mouth   Wheat Bran Rash and Other (See Comments)     tingling in mouth, vomiting    Social History   Tobacco Use   Smoking status: Former    Packs/day: 1.50    Years: 18.00    Total pack years: 27.00    Types: Cigarettes    Quit date: 09/16/1984    Years since quitting: 37.2   Smokeless tobacco: Never   Tobacco comments:    quit 1986  Substance Use Topics   Alcohol use: Yes    Comment: social    Family History  Problem Relation Age of Onset   Hypertension Father    Diabetes Father    Lupus Daughter    Colon cancer Maternal Aunt    Colon cancer Maternal Uncle        4 mat uncles dx colon ca   Esophageal cancer Neg Hx    Rectal cancer Neg Hx    Stomach cancer Neg Hx      Review of Systems  Musculoskeletal:  Positive for arthralgias.  All other systems reviewed and are negative.   Objective:  Physical Exam Vitals reviewed.  HENT:     Head: Normocephalic.     Nose: Nose normal.     Mouth/Throat:     Mouth: Mucous membranes are moist.  Eyes:     Pupils: Pupils are equal, round, and reactive to light.  Cardiovascular:     Rate and Rhythm: Normal rate.     Pulses: Normal pulses.  Pulmonary:     Effort: Pulmonary effort is normal.  Abdominal:     General: Abdomen is flat.  Musculoskeletal:     Cervical back: Normal range of motion.  Skin:    General: Skin is warm.     Capillary  Refill: Capillary refill takes less than 2 seconds.  Neurological:     General: No focal deficit present.     Mental Status: She is alert.  Psychiatric:  Mood and Affect: Mood normal.   Right knee range of motion demonstrates 5 to 105 degrees of motion.  Slight varus alignment.  Pedal pulses palpable.  Skin intact.  No groin pain on the right with internal/external rotation of the leg.  Vital signs in last 24 hours: Temp:  [97.8 F (36.6 C)] 97.8 F (36.6 C) (10/05 0900) Pulse Rate:  [77-79] 77 (10/05 1040) Resp:  [18-20] 20 (10/05 1040) BP: (130-140)/(88-91) 130/91 (10/05 1040) SpO2:  [94 %-97 %] 94 % (10/05 1040) Weight:  [102.1 kg] 102.1 kg (10/05 0900)  Labs:   Estimated body mass index is 39.86 kg/m as calculated from the following:   Height as of this encounter: 5\' 3"  (1.6 m).   Weight as of this encounter: 102.1 kg.   Imaging Review Plain radiographs demonstrate moderate degenerative joint disease of the right knee(s). The overall alignment ismild varus. The bone quality appears to be good for age and reported activity level.      Assessment/Plan:  End stage arthritis, right knee   The patient history, physical examination, clinical judgment of the provider and imaging studies are consistent with end stage degenerative joint disease of the right knee(s) and total knee arthroplasty is deemed medically necessary. The treatment options including medical management, injection therapy arthroscopy and arthroplasty were discussed at length. The risks and benefits of total knee arthroplasty were presented and reviewed. The risks due to aseptic loosening, infection, stiffness, patella tracking problems, thromboembolic complications and other imponderables were discussed. The patient acknowledged the explanation, agreed to proceed with the plan and consent was signed. Patient is being admitted for inpatient treatment for surgery, pain control, PT, OT, prophylactic  antibiotics, VTE prophylaxis, progressive ambulation and ADL's and discharge planning. The patient is planning to be discharged to skilled nursing facility     Patient's anticipated LOS is less than 2 midnights, meeting these requirements: - Younger than 30 - Lives within 1 hour of care - Has a competent adult at home to recover with post-op recover - NO history of  - Chronic pain requiring opiods  - Diabetes  - Coronary Artery Disease  - Heart failure  - Heart attack  - Stroke  - DVT/VTE  - Cardiac arrhythmia  - Respiratory Failure/COPD  - Renal failure  - Anemia  - Advanced Liver disease

## 2021-11-29 NOTE — Progress Notes (Signed)
Orthopedic Tech Progress Note Patient Details:  Cheryl Prince March 28, 1955 [id number]  CPM on RLE @ 1550. Will drop off bone foam once pt is in room 5N27.   CPM Right Knee CPM Right Knee: On Right Knee Flexion (Degrees): 35 (40* was too painful for pt) Right Knee Extension (Degrees): 0     Cheryl Prince Cheryl Prince 11/29/2021, 4:05 PM

## 2021-11-30 ENCOUNTER — Encounter (HOSPITAL_COMMUNITY): Payer: Self-pay | Admitting: Orthopedic Surgery

## 2021-11-30 ENCOUNTER — Other Ambulatory Visit: Payer: Self-pay

## 2021-11-30 MED ORDER — OXYCODONE HCL 5 MG PO TABS: 5.0000 mg | ORAL_TABLET | ORAL | 0 refills | Status: DC | PRN

## 2021-11-30 MED ORDER — RIVAROXABAN 10 MG PO TABS: 10.0000 mg | ORAL_TABLET | Freq: Every day | ORAL | 0 refills | Status: DC

## 2021-11-30 NOTE — Plan of Care (Signed)
  Problem: Activity: Goal: Ability to avoid complications of mobility impairment will improve Outcome: Progressing   Problem: Activity: Goal: Range of joint motion will improve Outcome: Progressing   Problem: Clinical Measurements: Goal: Postoperative complications will be avoided or minimized Outcome: Progressing   Problem: Pain Management: Goal: Pain level will decrease with appropriate interventions Outcome: Progressing   Problem: Education: Goal: Knowledge of General Education information will improve Description: Including pain rating scale, medication(s)/side effects and non-pharmacologic comfort measures Outcome: Progressing

## 2021-11-30 NOTE — Evaluation (Signed)
Physical Therapy Evaluation Patient Details Name: Cheryl Prince MRN: [id number] DOB: 1955-07-10 Today's Date: 11/30/2021  History of Present Illness  66 yo female with failed conservative measures for her R knee was admitted on 10/5 for TKA on RLE.  Pt has PMHx:  bipolar 1 disorder, bronchitis, GERD, PTSD, CTS, onychomycosis, HTN, L rotator cuff tendonitis, L knee TKA,  Clinical Impression  Pt was seen for first PT visit post op with notable pain to move RLE at all, difficulty controlling L knee extension and distraction with needs during session.  Pt is up to [facility], then to chair to sit up with nursing aware of her needs.  Ice applied, and provided initial instruction for getting ROM started.  Pt will be followed up with PT for progression to get her to SNF care hopefully, as she is home alone with 15 steps to go to bedroom on her second floor.  Follow acute PT goals as are outlined below.       Recommendations for follow up therapy are one component of a multi-disciplinary discharge planning process, led by the attending physician.  Recommendations may be updated based on patient status, additional functional criteria and insurance authorization.  Follow Up Recommendations Follow physician's recommendations for discharge plan and follow up therapies      Assistance Recommended at Discharge Frequent or constant Supervision/Assistance  Patient can return home with the following  A lot of help with walking and/or transfers;A lot of help with bathing/dressing/bathroom;Assistance with cooking/housework;Assist for transportation;Help with stairs or ramp for entrance    Equipment Recommendations None recommended by PT  Recommendations for Other Services       Functional Status Assessment Patient has had a recent decline in their functional status and demonstrates the ability to make significant improvements in function in a reasonable and predictable amount of time.     Precautions /  Restrictions Precautions Precautions: Knee Precaution Comments: reviewed TKA precautions Restrictions Weight Bearing Restrictions: Yes Other Position/Activity Restrictions: WBAT      Mobility  Bed Mobility Overal bed mobility: Needs Assistance Bed Mobility: Supine to Sit     Supine to sit: Min assist, Mod assist     General bed mobility comments: min for LE and mod for trunk    Transfers Overall transfer level: Needs assistance Equipment used: Rolling walker (2 wheels) Transfers: Sit to/from Stand Sit to Stand: Mod assist           General transfer comment: mod from bedside and mod for [facility]    Ambulation/Gait Ambulation/Gait assistance: Min assist Gait Distance (Feet): 10 Feet (4+6) Assistive device: Rolling walker (2 wheels), 1 person hand held assist Gait Pattern/deviations: Step-to pattern, Step-through pattern, Decreased stride length, Decreased weight shift to right Gait velocity: reduced Gait velocity interpretation: <1.31 ft/sec, indicative of household ambulator   General Gait Details: pt requires help to support on walker for RLE in brace but is stable to stand on it with RW and cues for maintaining it under her  Stairs            Wheelchair Mobility    Modified Rankin (Stroke Patients Only)       Balance Overall balance assessment: Needs assistance Sitting-balance support: Feet supported Sitting balance-Leahy Scale: Fair     Standing balance support: Bilateral upper extremity supported, During functional activity Standing balance-Leahy Scale: Poor Standing balance comment: cues for transition to walker with hands and to support her balance  Pertinent Vitals/Pain Pain Assessment Pain Assessment: Faces Faces Pain Scale: Hurts even more Pain Location: R knee with assisted movement Pain Descriptors / Indicators: Operative site guarding, Grimacing Pain Intervention(s): Limited activity within  patient's tolerance, Monitored during session, Premedicated before session, Repositioned, Ice applied    Home Living Family/patient expects to be discharged to:: Skilled nursing facility Living Arrangements: Alone                 Additional Comments: was home with Rollator and RW at baseline    Prior Function Prior Level of Function : Independent/Modified Independent             Mobility Comments: able to walk with rollator at baseline but had RW at top of stairs       Hand Dominance   Dominant Hand: Right    Extremity/Trunk Assessment   Upper Extremity Assessment Upper Extremity Assessment: Overall WFL for tasks assessed    Lower Extremity Assessment Lower Extremity Assessment: RLE deficits/detail RLE Deficits / Details: new TKA with 40 deg tolerance to flex RLE: Unable to fully assess due to pain RLE Coordination: decreased gross motor    Cervical / Trunk Assessment Cervical / Trunk Assessment: Kyphotic (very mild)  Communication   Communication: No difficulties  Cognition Arousal/Alertness: Awake/alert Behavior During Therapy: Anxious Overall Cognitive Status: Within Functional Limits for tasks assessed                                 General Comments: anxiety with movement but pt is able to describe her issues        General Comments General comments (skin integrity, edema, etc.): pt was seen for mobility with RW and R knee immobilizer, and is mod assist to move mainly over the difficulty of the brace and getting it placed for initiation and landing with transfers    Exercises     Assessment/Plan    PT Assessment Patient needs continued PT services  PT Problem List Decreased strength;Decreased range of motion;Decreased activity tolerance;Decreased balance;Decreased mobility;Decreased coordination;Decreased knowledge of precautions;Decreased skin integrity;Pain       PT Treatment Interventions DME instruction;Gait training;Stair  training;Functional mobility training;Therapeutic activities;Therapeutic exercise;Balance training;Neuromuscular re-education;Patient/family education    PT Goals (Current goals can be found in the Care Plan section)  Acute Rehab PT Goals Patient Stated Goal: to get pain managed PT Goal Formulation: With patient Time For Goal Achievement: 12/14/21 Potential to Achieve Goals: Good    Frequency 7X/week     Co-evaluation               AM-PAC PT "6 Clicks" Mobility  Outcome Measure Help needed turning from your back to your side while in a flat bed without using bedrails?: A Lot Help needed moving from lying on your back to sitting on the side of a flat bed without using bedrails?: A Lot Help needed moving to and from a bed to a chair (including a wheelchair)?: A Lot Help needed standing up from a chair using your arms (e.g., wheelchair or bedside chair)?: A Lot Help needed to walk in hospital room?: A Little Help needed climbing 3-5 steps with a railing? : Total 6 Click Score: 12    End of Session Equipment Utilized During Treatment: Gait belt Activity Tolerance: Patient limited by fatigue;Treatment limited secondary to medical complications (Comment);Patient limited by pain Patient left: in chair;with call bell/phone within reach;with chair alarm set Nurse Communication: Mobility status PT  Visit Diagnosis: Unsteadiness on feet (R26.81);Muscle weakness (generalized) (M62.81);Pain;Other abnormalities of gait and mobility (R26.89) Pain - Right/Left: Right Pain - part of body: Knee    Time: 9758-8325 PT Time Calculation (min) (ACUTE ONLY): 33 min   Charges:   PT Evaluation $PT Eval Moderate Complexity: 1 Mod PT Treatments $Gait Training: 8-22 mins       Ivar Drape 11/30/2021, 3:43 PM  Samul Dada, PT PhD Acute Rehab Dept. Number: [facility] [id number] and [facility] [phone]

## 2021-11-30 NOTE — TOC Initial Note (Addendum)
Transition of Care [facility]) - Initial/Assessment Note    Patient Details  Name: Cheryl Prince MRN: [id number] Date of Birth: 04-15-1955  [facility]) CM/SW Contact:    Joanne Chars, LCSW Phone Number: 11/30/2021, 3:11 PM  Clinical Narrative:   CSW met with  pt for Code 70 and initial assessment.  Pt lives alone, no current services.  Permission given to speak with daughter Cheryl Prince.  Pt believes she needs SNF rehab and would like to go to [facility], discussed that PT eval is pending at this time, will wait for recommendations.  Pt is vaccinated for covid but not boosted. TOC will continue to follow.                   Expected Discharge Plan:  (TBD) Barriers to Discharge: Continued Medical Work up   Patient Goals and CMS Choice Patient states their goals for this hospitalization and ongoing recovery are:: get back to 100%      Expected Discharge Plan and Services Expected Discharge Plan:  (TBD) In-house Referral: Clinical Social Work   Post Acute Care Choice:  (TBD) Living arrangements for the past 2 months: Single Family Home                                      Prior Living Arrangements/Services Living arrangements for the past 2 months: Single Family Home Lives with:: Self Patient language and need for interpreter reviewed:: Yes Do you feel safe going back to the place where you live?: Yes      Need for Family Participation in Patient Care: No (Comment) Care giver support system in place?: Yes (comment) Current home services: Other (comment) (none) Criminal Activity/Legal Involvement Pertinent to Current Situation/Hospitalization: No - Comment as needed  Activities of Daily Living Home Assistive Devices/Equipment: Cane (specify quad or straight), Walker (specify type) ADL Screening (condition at time of admission) Patient's cognitive ability adequate to safely complete daily activities?: Yes Is the patient deaf or have difficulty  hearing?: No Does the patient have difficulty seeing, even when wearing glasses/contacts?: No Does the patient have difficulty concentrating, remembering, or making decisions?: No Patient able to express need for assistance with ADLs?: Yes Does the patient have difficulty dressing or bathing?: No Independently performs ADLs?: Yes (appropriate for developmental age) Does the patient have difficulty walking or climbing stairs?: Yes Weakness of Legs: Both Weakness of Arms/Hands: None  Permission Sought/Granted Permission sought to share information with : Family Supports Permission granted to share information with : Yes, Verbal Permission Granted  Share Information with NAME: daughter Cheryl Prince           Emotional Assessment Appearance:: Appears stated age Attitude/Demeanor/Rapport: Engaged Affect (typically observed): Pleasant, Appropriate Orientation: : Oriented to Self, Oriented to Place, Oriented to  Time, Oriented to Situation Alcohol / Substance Use: Not Applicable Psych Involvement: No (comment)  Admission diagnosis:  Arthritis of knee [M17.10] Patient Active Problem List   Diagnosis Date Noted   Arthritis of knee 11/29/2021   Bipolar I disorder ([place]) 07/26/2020   Right hip pain 07/26/2020   Bronchitis 07/26/2020   Allergies 07/26/2020   GERD (gastroesophageal reflux disease) 07/26/2020   At risk for decreased bone density 07/26/2020   PTSD (post-traumatic stress disorder) 05/30/2016   Alpha thalassemia trait 05/30/2016   Hypertension 05/30/2016   Rotator cuff tendonitis, left 05/30/2016   Osteoarthritis of left knee 05/30/2016  Carpal tunnel syndrome 05/30/2016   Prediabetes 05/30/2016   Onychomycosis 05/30/2016   PCP:  Elsie Stain, MD Pharmacy:   [facility] ([place], [place] [facility] [address] [place] [address] [place] [address] Phone: [phone] Fax: [phone]  [facility] [place], [place] AT [place] [place] [address] Phone: [phone] Fax: [phone]  [place] [place] [address] Phone: [phone] Fax: [phone]     Social Determinants of Health (SDOH) Interventions    Readmission Risk Interventions     No data to display

## 2021-11-30 NOTE — Care Management CC44 (Signed)
Condition Code 44 Documentation Completed  Patient Details  Name: Halayna Blane MRN: [id number] Date of Birth: 11/07/1955   Condition Code 44 given:  Yes Patient signature on Condition Code 44 notice:  Yes Documentation of 2 MD's agreement:  Yes Code 44 added to claim:  Yes    Joanne Chars, LCSW 11/30/2021, 9:06 AM

## 2021-11-30 NOTE — Progress Notes (Signed)
Physical Therapy Treatment Patient Details Name: Cheryl Prince MRN: [id number] DOB: 03/19/1955 Today's Date: 11/30/2021   History of Present Illness 66 yo female with failed conservative measures for her R knee was admitted on 10/5 for TKA on RLE.  Pt has PMHx:  bipolar 1 disorder, bronchitis, GERD, PTSD, CTS, onychomycosis, HTN, L rotator cuff tendonitis, L knee TKA,    PT Comments    Pt is up to walk to bed with stop on BSC, and is demonstrating better control of RLE in brace to capture standing balance on RW.  Pt is motivated to work, and allowed PT to assist flexion of knee AAROM before CPM was placed on R knee.  Pt is in polar care and settled in bed with all needs in reach.  Follow BID for progression to home if possible but SNF is likely to be needed.   Recommendations for follow up therapy are one component of a multi-disciplinary discharge planning process, led by the attending physician.  Recommendations may be updated based on patient status, additional functional criteria and insurance authorization.  Follow Up Recommendations  Follow physician's recommendations for discharge plan and follow up therapies     Assistance Recommended at Discharge Frequent or constant Supervision/Assistance  Patient can return home with the following A lot of help with walking and/or transfers;A lot of help with bathing/dressing/bathroom;Assistance with cooking/housework;Assist for transportation;Help with stairs or ramp for entrance   Equipment Recommendations  None recommended by PT    Recommendations for Other Services       Precautions / Restrictions Precautions Precautions: Knee Precaution Comments: reviewed TKA precautions Restrictions Weight Bearing Restrictions: Yes Other Position/Activity Restrictions: WBAT     Mobility  Bed Mobility Overal bed mobility: Needs Assistance Bed Mobility: Supine to Sit     Supine to sit: Min assist, Mod assist     General bed  mobility comments: mod assist for legs to return to bed    Transfers Overall transfer level: Needs assistance Equipment used: Rolling walker (2 wheels) Transfers: Sit to/from Stand Sit to Stand: Mod assist, Min assist           General transfer comment: min mod from bedside and mod for [facility]    Ambulation/Gait Ambulation/Gait assistance: Min assist Gait Distance (Feet): 8 Feet Assistive device: Rolling walker (2 wheels), 1 person hand held assist Gait Pattern/deviations: Step-to pattern, Step-through pattern, Decreased stride length, Decreased weight shift to right Gait velocity: reduced Gait velocity interpretation: <1.31 ft/sec, indicative of household ambulator   General Gait Details: pt requires help to support on walker for RLE in brace but is stable to stand on it with RW and cues for maintaining it under her   Stairs             Wheelchair Mobility    Modified Rankin (Stroke Patients Only)       Balance Overall balance assessment: Needs assistance Sitting-balance support: Feet supported Sitting balance-Leahy Scale: Fair     Standing balance support: Bilateral upper extremity supported, During functional activity Standing balance-Leahy Scale: Poor Standing balance comment: cues for transition to walker with hands and to support her balance                            Cognition Arousal/Alertness: Awake/alert Behavior During Therapy: Anxious Overall Cognitive Status: Within Functional Limits for tasks assessed  General Comments: anxiety with movement but pt is able to describe her issues        Exercises      General Comments General comments (skin integrity, edema, etc.): continues to use immobilizer and ice with pt reporting being very cold.  Declined to remove the polar care for now      Pertinent Vitals/Pain Pain Assessment Pain Assessment: Faces Faces Pain Scale: Hurts even  more Pain Location: R knee with assisted movement Pain Descriptors / Indicators: Operative site guarding, Grimacing Pain Intervention(s): Monitored during session, Ice applied    Home Living Family/patient expects to be discharged to:: Skilled nursing facility Living Arrangements: Alone                 Additional Comments: was home with Rollator and RW at baseline    Prior Function            PT Goals (current goals can now be found in the care plan section) Acute Rehab PT Goals Patient Stated Goal: to get pain managed PT Goal Formulation: With patient Time For Goal Achievement: 12/14/21 Potential to Achieve Goals: Good    Frequency    7X/week      PT Plan      Co-evaluation              AM-PAC PT "6 Clicks" Mobility   Outcome Measure  Help needed turning from your back to your side while in a flat bed without using bedrails?: A Lot Help needed moving from lying on your back to sitting on the side of a flat bed without using bedrails?: A Lot Help needed moving to and from a bed to a chair (including a wheelchair)?: A Lot Help needed standing up from a chair using your arms (e.g., wheelchair or bedside chair)?: A Lot Help needed to walk in hospital room?: A Little Help needed climbing 3-5 steps with a railing? : Total 6 Click Score: 12    End of Session Equipment Utilized During Treatment: Gait belt Activity Tolerance: Patient limited by fatigue;Treatment limited secondary to medical complications (Comment);Patient limited by pain Patient left: in chair;with call bell/phone within reach;with chair alarm set Nurse Communication: Mobility status PT Visit Diagnosis: Unsteadiness on feet (R26.81);Muscle weakness (generalized) (M62.81);Pain;Other abnormalities of gait and mobility (R26.89) Pain - Right/Left: Right Pain - part of body: Knee     Time: 8032-1224 PT Time Calculation (min) (ACUTE ONLY): 34 min  Charges:  $Gait Training: 8-22  mins $Therapeutic Exercise: 8-22 mins      Ivar Drape 11/30/2021, 5:19 PM  Samul Dada, PT PhD Acute Rehab Dept. Number: [facility] [id number] and [facility] [phone]

## 2021-11-30 NOTE — Plan of Care (Signed)
  Problem: Activity: Goal: Range of joint motion will improve Outcome: Progressing   Problem: Clinical Measurements: Goal: Postoperative complications will be avoided or minimized Outcome: Progressing   Problem: Pain Management: Goal: Pain level will decrease with appropriate interventions Outcome: Progressing   Problem: Education: Goal: Knowledge of General Education information will improve Description: Including pain rating scale, medication(s)/side effects and non-pharmacologic comfort measures Outcome: Progressing   Problem: Clinical Measurements: Goal: Will remain free from infection Outcome: Progressing

## 2021-11-30 NOTE — Progress Notes (Signed)
  Subjective: Patient stable.  Pain controlled.  Has not had pain medicine since 2 AM.   Objective: Vital signs in last 24 hours: Temp:  [97.8 F (36.6 C)-98.2 F (36.8 C)] 98.2 F (36.8 C) (10/06 7628) Pulse Rate:  [53-80] 75 (10/06 0632) Resp:  [14-24] 16 (10/06 0632) BP: (97-154)/(70-100) 134/82 (10/06 0632) SpO2:  [92 %-100 %] 100 % (10/06 3151) Weight:  [102.1 kg] 102.1 kg (10/05 0900)  Intake/Output from previous day: 10/05 0701 - 10/06 0700 In: 853 [I.V.:703; IV Piggyback:150] Out: 7616 [Urine:1200; Blood:75] Intake/Output this shift: No intake/output data recorded.  Exam:  Sensation intact distally Intact pulses distally Dorsiflexion/Plantar flexion intact  Labs: No results for input(s): "HGB" in the last 72 hours. No results for input(s): "WBC", "RBC", "HCT", "PLT" in the last 72 hours. No results for input(s): "NA", "K", "CL", "CO2", "BUN", "CREATININE", "GLUCOSE", "CALCIUM" in the last 72 hours. No results for input(s): "LABPT", "INR" in the last 72 hours.  Assessment/Plan: Plan at this time is physical therapy this morning and this afternoon.  The social work consult for placement.  Anticipate placement into skilled nursing by Saturday Sunday or at the latest Monday.   Cheryl Prince 11/30/2021, 7:43 AM

## 2021-11-30 NOTE — Care Management Obs Status (Signed)
[place] NOTIFICATION   Patient Details  Name: Cheryl Prince MRN: [id number] Date of Birth: Oct 17, 1955   Medicare Observation Status Notification Given:  Yes    Joanne Chars, LCSW 11/30/2021, 9:06 AM

## 2021-12-01 DIAGNOSIS — Z833 Family history of diabetes mellitus: Secondary | ICD-10-CM | POA: Diagnosis not present

## 2021-12-01 DIAGNOSIS — Z79899 Other long term (current) drug therapy: Secondary | ICD-10-CM | POA: Diagnosis not present

## 2021-12-01 DIAGNOSIS — Z90710 Acquired absence of both cervix and uterus: Secondary | ICD-10-CM | POA: Diagnosis not present

## 2021-12-01 DIAGNOSIS — F319 Bipolar disorder, unspecified: Secondary | ICD-10-CM | POA: Diagnosis present

## 2021-12-01 DIAGNOSIS — Z9884 Bariatric surgery status: Secondary | ICD-10-CM | POA: Diagnosis not present

## 2021-12-01 DIAGNOSIS — Z8249 Family history of ischemic heart disease and other diseases of the circulatory system: Secondary | ICD-10-CM | POA: Diagnosis not present

## 2021-12-01 DIAGNOSIS — Z800 Family history of malignant neoplasm of digestive organs: Secondary | ICD-10-CM | POA: Diagnosis not present

## 2021-12-01 DIAGNOSIS — Z888 Allergy status to other drugs, medicaments and biological substances status: Secondary | ICD-10-CM | POA: Diagnosis not present

## 2021-12-01 DIAGNOSIS — Z91013 Allergy to seafood: Secondary | ICD-10-CM | POA: Diagnosis not present

## 2021-12-01 DIAGNOSIS — Z880 Allergy status to penicillin: Secondary | ICD-10-CM | POA: Diagnosis not present

## 2021-12-01 DIAGNOSIS — K219 Gastro-esophageal reflux disease without esophagitis: Secondary | ICD-10-CM | POA: Diagnosis present

## 2021-12-01 DIAGNOSIS — Z885 Allergy status to narcotic agent status: Secondary | ICD-10-CM | POA: Diagnosis not present

## 2021-12-01 DIAGNOSIS — F4310 Post-traumatic stress disorder, unspecified: Secondary | ICD-10-CM | POA: Diagnosis present

## 2021-12-01 DIAGNOSIS — Z6839 Body mass index (BMI) 39.0-39.9, adult: Secondary | ICD-10-CM | POA: Diagnosis not present

## 2021-12-01 DIAGNOSIS — E739 Lactose intolerance, unspecified: Secondary | ICD-10-CM | POA: Diagnosis present

## 2021-12-01 DIAGNOSIS — Z91018 Allergy to other foods: Secondary | ICD-10-CM | POA: Diagnosis not present

## 2021-12-01 DIAGNOSIS — Z96652 Presence of left artificial knee joint: Secondary | ICD-10-CM | POA: Diagnosis present

## 2021-12-01 DIAGNOSIS — Z87891 Personal history of nicotine dependence: Secondary | ICD-10-CM | POA: Diagnosis not present

## 2021-12-01 DIAGNOSIS — I10 Essential (primary) hypertension: Secondary | ICD-10-CM | POA: Diagnosis present

## 2021-12-01 DIAGNOSIS — M25561 Pain in right knee: Secondary | ICD-10-CM | POA: Diagnosis present

## 2021-12-01 DIAGNOSIS — M1711 Unilateral primary osteoarthritis, right knee: Secondary | ICD-10-CM | POA: Diagnosis present

## 2021-12-01 DIAGNOSIS — D563 Thalassemia minor: Secondary | ICD-10-CM | POA: Diagnosis present

## 2021-12-01 NOTE — Progress Notes (Signed)
Physical Therapy Treatment Patient Details Name: Cheryl Prince MRN: [id number] DOB: Jan 17, 1956 Today's Date: 12/01/2021   History of Present Illness 66 yo female with failed conservative measures for her R knee was admitted on 10/5 for TKA on RLE.  Pt has PMHx:  bipolar 1 disorder, bronchitis, GERD, PTSD, CTS, onychomycosis, HTN, L rotator cuff tendonitis, L knee TKA,    PT Comments    Pt very determined to progress this session. She stood from recliner chair and ambulated short distance in room before becoming short of breath and reporting she was "done". Pt assisted back to bed with CPM on. Rn notified of pt request for pain meds. Current plan remains appropriate. Will continue follow acutely.     Recommendations for follow up therapy are one component of a multi-disciplinary discharge planning process, led by the attending physician.  Recommendations may be updated based on patient status, additional functional criteria and insurance authorization.  Follow Up Recommendations  Follow physician's recommendations for discharge plan and follow up therapies     Assistance Recommended at Discharge Frequent or constant Supervision/Assistance  Patient can return home with the following A lot of help with walking and/or transfers;A lot of help with bathing/dressing/bathroom;Assistance with cooking/housework;Assist for transportation;Help with stairs or ramp for entrance   Equipment Recommendations  None recommended by PT    Recommendations for Other Services       Precautions / Restrictions Precautions Precautions: Knee Precaution Comments: reviewed TKA precautions Restrictions Weight Bearing Restrictions: Yes Other Position/Activity Restrictions: WBAT     Mobility  Bed Mobility Overal bed mobility: Needs Assistance Bed Mobility: Sit to Supine       Sit to supine: Min assist   General bed mobility comments: min A for LE management to return to bed.     Transfers Overall transfer level: Needs assistance Equipment used: Rolling walker (2 wheels) Transfers: Sit to/from Stand Sit to Stand: Min assist           General transfer comment: min A to rise from recliner chair with increased time and effort and use of arm rests.    Ambulation/Gait Ambulation/Gait assistance: Min assist Gait Distance (Feet): 25 Feet Assistive device: Rolling walker (2 wheels), 1 person hand held assist Gait Pattern/deviations: Step-to pattern, Decreased stride length, Decreased weight shift to right Gait velocity: reduced Gait velocity interpretation: <1.31 ft/sec, indicative of household ambulator   General Gait Details: KI on during gait. Pt with difficulty clearing RLE and ambulates with R food slightly dragging. Small steps on the L due to pain and weakness. Heavily reliant on RW for support. Pt out of breath after gait trial.   Stairs             Wheelchair Mobility    Modified Rankin (Stroke Patients Only)       Balance Overall balance assessment: Needs assistance Sitting-balance support: Feet supported Sitting balance-Leahy Scale: Fair     Standing balance support: Bilateral upper extremity supported, During functional activity Standing balance-Leahy Scale: Poor Standing balance comment: cues for transition to walker with hands and to support her balance                            Cognition Arousal/Alertness: Awake/alert Behavior During Therapy: Anxious Overall Cognitive Status: Within Functional Limits for tasks assessed  General Comments: Needed repeated instructions at start of session but seemed to improve as session progressed.        Exercises      General Comments        Pertinent Vitals/Pain Pain Assessment Pain Assessment: Faces Faces Pain Scale: Hurts little more Pain Location: R knee with movement Pain Descriptors / Indicators: Operative site  guarding, Grimacing Pain Intervention(s): Monitored during session, Limited activity within patient's tolerance, Repositioned    Home Living                          Prior Function            PT Goals (current goals can now be found in the care plan section) Acute Rehab PT Goals Patient Stated Goal: to get pain managed PT Goal Formulation: With patient Time For Goal Achievement: 12/14/21 Potential to Achieve Goals: Good Progress towards PT goals: Progressing toward goals    Frequency    7X/week      PT Plan Current plan remains appropriate    Co-evaluation              AM-PAC PT "6 Clicks" Mobility   Outcome Measure  Help needed turning from your back to your side while in a flat bed without using bedrails?: A Little Help needed moving from lying on your back to sitting on the side of a flat bed without using bedrails?: A Little Help needed moving to and from a bed to a chair (including a wheelchair)?: A Little Help needed standing up from a chair using your arms (e.g., wheelchair or bedside chair)?: A Little Help needed to walk in hospital room?: A Little Help needed climbing 3-5 steps with a railing? : Total 6 Click Score: 16    End of Session Equipment Utilized During Treatment: Gait belt Activity Tolerance: Patient tolerated treatment well Patient left: in bed;with call bell/phone within reach Nurse Communication: Mobility status;Patient requests pain meds PT Visit Diagnosis: Unsteadiness on feet (R26.81);Muscle weakness (generalized) (M62.81);Pain;Other abnormalities of gait and mobility (R26.89) Pain - Right/Left: Right Pain - part of body: Knee     Time: 1001-1029 PT Time Calculation (min) (ACUTE ONLY): 28 min  Charges:  $Gait Training: 23-37 mins                    Kallie Locks, PTA Acute Rehab   Sheral Apley 12/01/2021, 10:43 AM

## 2021-12-01 NOTE — Progress Notes (Signed)
PHARMACIST - PHYSICIAN COMMUNICATION  CONCERNING: P&T Medication Policy Regarding Oral Bisphosphonates  RECOMMENDATION: Your order for alendronate (Fosamax), ibandronate (Boniva), or risedronate (Actonel) has been discontinued at this time.  If the patient's post-hospital medical condition warrants safe use of this class of drugs, please resume the pre-hospital regimen upon discharge.  DESCRIPTION:  Alendronate (Fosamax), ibandronate (Boniva), and risedronate (Actonel) can cause severe esophageal erosions in patients who are unable to remain upright at least 30 minutes after taking this medication.   Since brief interruptions in therapy are thought to have minimal impact on bone mineral density, the Pharmacy & Therapeutics Committee has established that bisphosphonate orders should be routinely discontinued during hospitalization.   To override this safety policy and permit administration of Boniva, Fosamax, or Actonel in the hospital, prescribers must write "DO NOT HOLD" in the comments section when placing the order for this class of medications.  Twana Wileman A. Stalin Gruenberg, PharmD, BCPS, FNKF Clinical Pharmacist [place] Please utilize Amion for appropriate phone number to reach the unit pharmacist ([facility])    

## 2021-12-01 NOTE — Progress Notes (Signed)
  Subjective: Patient stable.  Pain controlled but making slow progress with therapy.   Objective: Vital signs in last 24 hours: Temp:  [97.9 F (36.6 C)-98.9 F (37.2 C)] 98.9 F (37.2 C) (10/06 2018) Pulse Rate:  [61-64] 61 (10/06 2018) Resp:  [16-20] 17 (10/06 2018) BP: (133-147)/(72-89) 147/89 (10/06 2018) SpO2:  [100 %] 100 % (10/06 2103)  Intake/Output from previous day: 10/06 0701 - 10/07 0700 In: 484.2 [P.O.:480; I.V.:4.2] Out: -  Intake/Output this shift: No intake/output data recorded.  Exam:  Sensation intact distally Intact pulses distally Dorsiflexion/Plantar flexion intact  Labs: No results for input(s): "HGB" in the last 72 hours. No results for input(s): "WBC", "RBC", "HCT", "PLT" in the last 72 hours. No results for input(s): "NA", "K", "CL", "CO2", "BUN", "CREATININE", "GLUCOSE", "CALCIUM" in the last 72 hours. No results for input(s): "LABPT", "INR" in the last 72 hours.  Assessment/Plan: Plan at this time is skilled nursing placement on Monday. Continue CPM machine 4 to 6 hours/day using the Iceman for 30 minutes after CPM use.  Okay to be weightbearing as tolerated.   G Scott Jennavecia Schwier 12/01/2021, 8:05 AM

## 2021-12-02 NOTE — NC FL2 (Signed)
[place] LEVEL OF CARE SCREENING TOOL     IDENTIFICATION  Patient Name: Cheryl Prince Birthdate: 1955/08/05 Sex: female Admission Date (Current Location): 11/29/2021  [employer] and [place] Number:  Herbalist and Address:  [facility] [place], [place] [address], [place],  [address]      Provider Number:    Attending Physician Name and Address:  Meredith Pel, MD  Relative Name and Phone Number:  Rosielee Corporan [phone]    Current Level of Care: Hospital Recommended Level of Care: [place] Prior Approval Number:    Date Approved/Denied:   PASRR Number: [id number]  Discharge Plan: SNF    Current Diagnoses: Patient Active Problem List   Diagnosis Date Noted   Arthritis of knee 11/29/2021   Bipolar I disorder ([place]) 07/26/2020   Right hip pain 07/26/2020   Bronchitis 07/26/2020   Allergies 07/26/2020   GERD (gastroesophageal reflux disease) 07/26/2020   At risk for decreased bone density 07/26/2020   PTSD (post-traumatic stress disorder) 05/30/2016   Alpha thalassemia trait 05/30/2016   Hypertension 05/30/2016   Rotator cuff tendonitis, left 05/30/2016   Osteoarthritis of left knee 05/30/2016   Carpal tunnel syndrome 05/30/2016   Prediabetes 05/30/2016   Onychomycosis 05/30/2016    Orientation RESPIRATION BLADDER Height & Weight     Self, Time, Situation, Place  Normal Continent Weight: 225 lb (102.1 kg) Height:  5\' 3"  (160 cm)  BEHAVIORAL SYMPTOMS/MOOD NEUROLOGICAL BOWEL NUTRITION STATUS      Continent Diet  AMBULATORY STATUS COMMUNICATION OF NEEDS Skin   Limited Assist Verbally Normal                       Personal Care Assistance Level of Assistance  Bathing, Feeding, Dressing Bathing Assistance: Limited assistance Feeding assistance: Independent Dressing Assistance: Limited assistance     Functional Limitations Info  Sight, Hearing, Speech Sight Info:  Adequate Hearing Info: Adequate Speech Info: Adequate    SPECIAL CARE FACTORS FREQUENCY  PT (By licensed PT), OT (By licensed OT)     PT Frequency: 5x oer week OT Frequency: 5x oer week            Contractures Contractures Info: Not present    Additional Factors Info  Code Status, Allergies Code Status Info: full code Allergies Info: morphone, penicillans, pine, shellfish, lactose intolerance, latex, red dye, sulfites, wheat bran           Current Medications (12/02/2021):  This is the current hospital active medication list Current Facility-Administered Medications  Medication Dose Route Frequency Provider Last Rate Last Admin   acetaminophen (TYLENOL) tablet 325-650 mg  325-650 mg Oral Q6H PRN Meredith Pel, MD       albuterol (PROVENTIL) (2.5 MG/3ML) 0.083% nebulizer solution 2.5 mg  2.5 mg Inhalation BID PRN Meredith Pel, MD   2.5 mg at 12/01/21 1425   budesonide (PULMICORT) nebulizer solution 0.25 mg  0.25 mg Nebulization BID Meredith Pel, MD   0.25 mg at 12/02/21 0754   cholecalciferol (VITAMIN D3) tablet 2,000 Units  2,000 Units Oral Daily Meredith Pel, MD   2,000 Units at 12/02/21 1005   docusate sodium (COLACE) capsule 100 mg  100 mg Oral BID Meredith Pel, MD   100 mg at 12/01/21 2207   famotidine (PEPCID) tablet 20 mg  20 mg Oral BID Meredith Pel, MD   20 mg at 12/02/21 1005   fluticasone (FLONASE) 50  MCG/ACT nasal spray 1 spray  1 spray Each Nare TID PRN Meredith Pel, MD       gabapentin (NEURONTIN) capsule 300 mg  300 mg Oral TID PRN Meredith Pel, MD   300 mg at 12/01/21 1505   losartan (COZAAR) tablet 100 mg  100 mg Oral Daily Madueme, Elvira C, RPH   100 mg at 12/02/21 1005   And   hydrochlorothiazide (HYDRODIURIL) tablet 25 mg  25 mg Oral Daily Madueme, Elvira C, RPH   25 mg at 12/02/21 1005   HYDROmorphone (DILAUDID) injection 0.5-1 mg  0.5-1 mg Intravenous Q4H PRN Meredith Pel, MD   1 mg at 12/01/21  1819   hydrOXYzine (ATARAX) tablet 10 mg  10 mg Oral Daily Meredith Pel, MD   10 mg at 12/02/21 1006   ibuprofen (ADVIL) tablet 600 mg  600 mg Oral Q6H PRN Meredith Pel, MD       loratadine (CLARITIN) tablet 10 mg  10 mg Oral Daily Meredith Pel, MD   10 mg at 12/02/21 1006   magnesium hydroxide (MILK OF MAGNESIA) suspension 30 mL  30 mL Oral Daily PRN Meredith Pel, MD       methocarbamol (ROBAXIN) tablet 500 mg  500 mg Oral Daily PRN Meredith Pel, MD   500 mg at 12/02/21 0144   metoCLOPramide (REGLAN) tablet 5-10 mg  5-10 mg Oral Q8H PRN Meredith Pel, MD       Or   metoCLOPramide (REGLAN) injection 5-10 mg  5-10 mg Intravenous Q8H PRN Meredith Pel, MD       montelukast (SINGULAIR) tablet 10 mg  10 mg Oral Daily Meredith Pel, MD   10 mg at 12/02/21 1006   multivitamin with minerals tablet 1 tablet  1 tablet Oral Daily Meredith Pel, MD   1 tablet at 12/02/21 1006   ondansetron (ZOFRAN) tablet 4 mg  4 mg Oral Q6H PRN Meredith Pel, MD       Or   ondansetron [facility]) injection 4 mg  4 mg Intravenous Q6H PRN Meredith Pel, MD       oxyCODONE (Oxy IR/ROXICODONE) immediate release tablet 5-10 mg  5-10 mg Oral Q4H PRN Meredith Pel, MD   10 mg at 12/02/21 [id number]   rivaroxaban (XARELTO) tablet 10 mg  10 mg Oral Q breakfast Meredith Pel, MD   10 mg at 12/02/21 [id number]     Discharge Medications: Please see discharge summary for a list of discharge medications.  Relevant Imaging Results:  Relevant Lab Results:   Additional Information SSN [id number]  Elliot Gurney [place], [place]

## 2021-12-02 NOTE — Plan of Care (Signed)
  Problem: Activity: Goal: Range of joint motion will improve Outcome: Progressing   Problem: Pain Management: Goal: Pain level will decrease with appropriate interventions Outcome: Progressing   Problem: Skin Integrity: Goal: Will show signs of wound healing Outcome: Progressing   Problem: Education: Goal: Knowledge of General Education information will improve Description: Including pain rating scale, medication(s)/side effects and non-pharmacologic comfort measures Outcome: Progressing   Problem: Nutrition: Goal: Adequate nutrition will be maintained Outcome: Progressing   Problem: Coping: Goal: Level of anxiety will decrease Outcome: Progressing

## 2021-12-02 NOTE — Progress Notes (Signed)
  Subjective: Pain well controlled. Worked with PT. Complaining of discoloration in her mucus.  Objective: Vital signs in last 24 hours: Temp:  [99.1 F (37.3 C)-100 F (37.8 C)] 99.1 F (37.3 C) (10/08 0408) Pulse Rate:  [81-94] 81 (10/08 0408) Resp:  [18-19] 18 (10/08 0408) BP: (100-131)/(77-82) 108/77 (10/08 0408) SpO2:  [95 %-99 %] 96 % (10/08 0408)  Intake/Output from previous day: 10/07 0701 - 10/08 0700 In: 360 [P.O.:360] Out: -  Intake/Output this shift: No intake/output data recorded.  Exam:  Sensation intact distally Intact pulses distally Dorsiflexion/Plantar flexion intact  Labs: No results for input(s): "HGB" in the last 72 hours. No results for input(s): "WBC", "RBC", "HCT", "PLT" in the last 72 hours. No results for input(s): "NA", "K", "CL", "CO2", "BUN", "CREATININE", "GLUCOSE", "CALCIUM" in the last 72 hours. No results for input(s): "LABPT", "INR" in the last 72 hours.  Assessment/Plan: Plan at this time is skilled nursing placement on Monday. Continue CPM machine 4 to 6 hours/day using the Iceman for 30 minutes after CPM use.  I described that discoloration consistent with post anesthesia, will observe for now.   Rush Salce 12/02/2021, 7:11 AM

## 2021-12-02 NOTE — Progress Notes (Signed)
Physical Therapy Treatment Patient Details Name: Cheryl Prince MRN: [id number] DOB: 09-Jun-1955 Today's Date: 12/02/2021   History of Present Illness 66 yo female with failed conservative measures for her R knee was admitted on 10/5 for TKA on RLE.  Pt has PMHx:  bipolar 1 disorder, bronchitis, GERD, PTSD, CTS, onychomycosis, HTN, L rotator cuff tendonitis, L knee TKA,    PT Comments    Continuing work on functional mobility and activity tolerance;  session focused on therex for knee stabiltiy in extension, and progressive amb; Assessed L knee stability in single limb stance at EOB, with multimodal cueing to active quads for better stance stability; Able to walk in the room without KI and without R knee giving way  Recommendations for follow up therapy are one component of a multi-disciplinary discharge planning process, led by the attending physician.  Recommendations may be updated based on patient status, additional functional criteria and insurance authorization.  Follow Up Recommendations  Follow physician's recommendations for discharge plan and follow up therapies     Assistance Recommended at Discharge Frequent or constant Supervision/Assistance  Patient can return home with the following A lot of help with walking and/or transfers;A lot of help with bathing/dressing/bathroom;Assistance with cooking/housework;Assist for transportation;Help with stairs or ramp for entrance   Equipment Recommendations  None recommended by PT    Recommendations for Other Services       Precautions / Restrictions Precautions Precautions: Knee Precaution Comments: reviewed TKA precautions Restrictions Weight Bearing Restrictions: No Other Position/Activity Restrictions: WBAT     Mobility  Bed Mobility Overal bed mobility: Needs Assistance Bed Mobility: Supine to Sit     Supine to sit: Min guard     General bed mobility comments: Cues for technqiue    Transfers Overall  transfer level: Needs assistance Equipment used: Rolling walker (2 wheels) Transfers: Sit to/from Stand Sit to Stand: Min assist           General transfer comment: Min assist to steady    Ambulation/Gait Ambulation/Gait assistance: Min guard Gait Distance (Feet): 15 Feet Assistive device: Rolling walker (2 wheels), 1 person hand held assist Gait Pattern/deviations: Step-to pattern, Decreased stride length, Decreased weight shift to right Gait velocity: reduced     General Gait Details: Able to walk in the room without KI an dno knee buckling  noted   Stairs             Wheelchair Mobility    Modified Rankin (Stroke Patients Only)       Balance     Sitting balance-Leahy Scale: Fair       Standing balance-Leahy Scale: Poor Standing balance comment: cues for transition to walker with hands and to support her balance                            Cognition Arousal/Alertness: Awake/alert Behavior During Therapy: WFL for tasks assessed/performed Overall Cognitive Status: Within Functional Limits for tasks assessed                                          Exercises Total Joint Exercises Ankle Circles/Pumps: AROM, Both, 10 reps Quad Sets: AROM, Left, 10 reps Short Arc Quad: AAROM, Left, 10 reps Heel Slides: AAROM, Left, 10 reps Straight Leg Raises: AAROM, Left, 5 reps Goniometric ROM: approx 2-65 degrees    General Comments  Pertinent Vitals/Pain Pain Assessment Pain Assessment: Faces Faces Pain Scale: Hurts even more Pain Location: R knee with movement Pain Descriptors / Indicators: Operative site guarding, Grimacing Pain Intervention(s): Monitored during session    Home Living                          Prior Function            PT Goals (current goals can now be found in the care plan section) Acute Rehab PT Goals Patient Stated Goal: to get pain managed PT Goal Formulation: With patient Time  For Goal Achievement: 12/14/21 Potential to Achieve Goals: Good Progress towards PT goals: Progressing toward goals    Frequency    7X/week      PT Plan Current plan remains appropriate    Co-evaluation              AM-PAC PT "6 Clicks" Mobility   Outcome Measure  Help needed turning from your back to your side while in a flat bed without using bedrails?: A Little Help needed moving from lying on your back to sitting on the side of a flat bed without using bedrails?: A Little Help needed moving to and from a bed to a chair (including a wheelchair)?: A Little Help needed standing up from a chair using your arms (e.g., wheelchair or bedside chair)?: A Little Help needed to walk in hospital room?: A Little Help needed climbing 3-5 steps with a railing? : Total 6 Click Score: 16    End of Session Equipment Utilized During Treatment: Gait belt Activity Tolerance: Patient tolerated treatment well Patient left: in chair;with call bell/phone within reach Nurse Communication: Mobility status PT Visit Diagnosis: Unsteadiness on feet (R26.81);Muscle weakness (generalized) (M62.81);Pain;Other abnormalities of gait and mobility (R26.89) Pain - Right/Left: Right Pain - part of body: Knee     Time: 0263-7858 PT Time Calculation (min) (ACUTE ONLY): 42 min  Charges:  $Gait Training: 8-22 mins $Therapeutic Exercise: 8-22 mins $Therapeutic Activity: 8-22 mins                     Van Clines, PT  Acute Rehabilitation Services Office [phone]    Levi Aland 12/02/2021, 5:23 PM

## 2021-12-02 NOTE — TOC Progression Note (Signed)
Transition of Care [facility]) - Progression Note    Patient Details  Name: Cheryl Prince MRN: [id number] Date of Birth: 10-01-1955  Transition of [facility]) CM/SW Contact  Onur Mori, [place],  Phone Number: 12/02/2021, 11:23 AM  Clinical Narrative:    CSW met with patient at bedside to discuss therapy recommendations for SNF. Patient agreeable to SNF with preference for [facility].  Fl2 completed and faxed out. Bed offers to be reviewed with patient once received.  Shatima Zalar, LCSW Transition of Care     Expected Discharge Plan:  (TBD) Barriers to Discharge: Continued Medical Work up  Expected Discharge Plan and Services Expected Discharge Plan:  (TBD) In-house Referral: Clinical Social Work   Post Acute Care Choice:  (TBD) Living arrangements for the past 2 months: Single Family Home                                       Social Determinants of Health (SDOH) Interventions    Readmission Risk Interventions     No data to display

## 2021-12-03 NOTE — Progress Notes (Signed)
Pt discharged to SNF in stable condition via bluebird cab. Patient states a CPM machine is being delivered to SNF today. Report called to SNF.

## 2021-12-03 NOTE — TOC Progression Note (Addendum)
Transition of Care [facility]) - Progression Note    Patient Details  Name: Sofija Antwi MRN: [id number] Date of Birth: [birth year]  Transition of Care [facility]) CM/SW Contact  Joanne Chars, LCSW Phone Number: 12/03/2021, 11:41 AM  Clinical Narrative:   CSW spoke with Michelle/[facility].  No bed available today, unclear when next bed will be available.  CSW presented bed offers to pt and she would like to accept offer at [facility].  Confirmed with Christine/[facility] that they can accept pt.  Auth request submitted in [place] and approved: [id number], 3 days: 10/9-10/11.  MD informed.   1330: CSW spoke with pt regarding transportation.  She would like to take cab to [employer], has credit card to pay for it.      Expected Discharge Plan:  (TBD) Barriers to Discharge: Continued Medical Work up  Expected Discharge Plan and Services Expected Discharge Plan:  (TBD) In-house Referral: Clinical Social Work   Post Acute Care Choice:  (TBD) Living arrangements for the past 2 months: Single Family Home                                       Social Determinants of Health (SDOH) Interventions    Readmission Risk Interventions     No data to display

## 2021-12-03 NOTE — Progress Notes (Signed)
  Subjective: Patient stable.  Pain controlled.  Ambulating in hall   Objective: Vital signs in last 24 hours: Temp:  [99 F (37.2 C)-99.8 F (37.7 C)] 99.8 F (37.7 C) (10/09 0824) Pulse Rate:  [77-93] 91 (10/09 1043) Resp:  [15-18] 16 (10/09 0824) BP: (97-111)/(61-80) 111/65 (10/09 1043) SpO2:  [94 %-98 %] 98 % (10/09 1043)  Intake/Output from previous day: 10/08 0701 - 10/09 0700 In: 120 [P.O.:120] Out: -  Intake/Output this shift: No intake/output data recorded.  Exam:  Dorsiflexion/Plantar flexion intact Compartment soft  Labs: No results for input(s): "HGB" in the last 72 hours. No results for input(s): "WBC", "RBC", "HCT", "PLT" in the last 72 hours. No results for input(s): "NA", "K", "CL", "CO2", "BUN", "CREATININE", "GLUCOSE", "CALCIUM" in the last 72 hours. No results for input(s): "LABPT", "INR" in the last 72 hours.  Assessment/Plan: Plan at this time is discharge to skilled nursing today.  Discharge summary done.  Prescription on chart for pain medicine.   Cheryl Prince 12/03/2021, 12:01 PM

## 2021-12-03 NOTE — TOC Transition Note (Signed)
Transition of Care [facility]) - CM/SW Discharge Note   Patient Details  Name: Stephaniemarie Stoffel MRN: [id number] Date of Birth: 12/16/1955  Transition of Care [facility]) CM/SW Contact:  Joanne Chars, LCSW Phone Number: 12/03/2021, 1:53 PM   Clinical Narrative:   Pt discharging to [facility], room 111B.  RN call report to [phone].      Final next level of care: Skilled Nursing Facility Barriers to Discharge: Barriers Resolved   Patient Goals and CMS Choice Patient states their goals for this hospitalization and ongoing recovery are:: get back to 100%      Discharge Placement              Patient chooses bed at:  [facility]) Patient to be transferred to facility by: cab Name of family member notified: daughter Cecille Po Patient and family notified of of transfer: 12/03/21  Discharge Plan and Services In-house Referral: Clinical Social Work   Post Acute Care Choice:  (TBD)                               Social Determinants of Health (SDOH) Interventions     Readmission Risk Interventions     No data to display

## 2021-12-03 NOTE — Progress Notes (Signed)
Physician Discharge Summary      Patient ID: Cheryl Prince MRN: [id number] DOB/AGE: Jun 01, 1955 66 y.o.  Admit date: 11/29/2021 Discharge date: 12/03/2021  Admission Diagnoses:  Principal Problem:   Arthritis of knee   Discharge Diagnoses:  Same  Surgeries: Procedure(s): RIGHT TOTAL KNEE ARTHROPLASTY on 11/29/2021   Consultants:   Discharged Condition: Stable  Hospital Course: Cheryl Prince is an 66 y.o. female who was admitted 11/29/2021 with a chief complaint of right knee pain, and found to have a diagnosis of Arthritis of knee.  They were brought to the operating room on 11/29/2021 and underwent the above named procedures.  Patient did well postoperatively.  She was mobilized with physical therapy.  Ambulating in the hall at the time of discharge.  Discharged to skilled nursing today with oxycodone for pain Robaxin as muscle relaxer and Xarelto for DVT prophylaxis.  Follow-up with [place] in approximately 10 days.  Weightbearing as tolerated and range of motion as tolerated right knee.  Antibiotics given:  Anti-infectives (From admission, onward)    Start     Dose/Rate Route Frequency Ordered Stop   11/29/21 2200  vancomycin (VANCOCIN) IVPB 1000 mg/200 mL premix        1,000 mg 200 mL/hr over 60 Minutes Intravenous Every 12 hours 11/29/21 2110 11/30/21 0759   11/29/21 1256  vancomycin (VANCOCIN) powder  Status:  Discontinued          As needed 11/29/21 1257 11/29/21 1400   11/29/21 1000  Vancomycin (VANCOCIN) 1,500 mg in sodium chloride 0.9 % 500 mL IVPB        1,500 mg 250 mL/hr over 120 Minutes Intravenous On call to O.R. 11/29/21 7412 11/29/21 1258     .  Recent vital signs:  Vitals:   12/03/21 0846 12/03/21 1043  BP:  111/65  Pulse:  91  Resp:    Temp:    SpO2: 96% 98%    Recent laboratory studies:  Results for orders placed or performed during the hospital encounter of 11/20/21  Urine Culture   Specimen: Urine, Clean Catch  Result Value Ref  Range   Specimen Description URINE, CLEAN CATCH    Special Requests NONE    Culture      NO GROWTH Performed at [facility], [address], [place], [place] [address]    Report Status 11/21/2021 FINAL   Surgical pcr screen   Specimen: Nasal Mucosa; Nasal Swab  Result Value Ref Range   MRSA, PCR NEGATIVE NEGATIVE   Staphylococcus aureus NEGATIVE NEGATIVE  Urinalysis, Complete w Microscopic  Result Value Ref Range   Color, Urine YELLOW YELLOW   APPearance CLEAR CLEAR   Specific Gravity, Urine 1.020 1.005 - 1.030   pH 6.5 5.0 - 8.0   Glucose, UA NEGATIVE NEGATIVE mg/dL   Hgb urine dipstick NEGATIVE NEGATIVE   Bilirubin Urine NEGATIVE NEGATIVE   Ketones, ur NEGATIVE NEGATIVE mg/dL   Protein, ur NEGATIVE NEGATIVE mg/dL   Nitrite NEGATIVE NEGATIVE   Leukocytes,Ua NEGATIVE NEGATIVE   Squamous Epithelial / LPF 0-5 0 - 5   WBC, UA 0-5 0 - 5 WBC/hpf   RBC / HPF 0-5 0 - 5 RBC/hpf   Bacteria, UA RARE (A) NONE SEEN   Mucus PRESENT   CBC  Result Value Ref Range   WBC 4.1 4.0 - 10.5 K/uL   RBC 4.56 3.87 - 5.11 MIL/uL   Hemoglobin 11.1 (L) 12.0 - 15.0 g/dL   HCT 67.2 (L) 09.4 - 70.9 %  MCV 77.2 (L) 80.0 - 100.0 fL   MCH 24.3 (L) 26.0 - 34.0 pg   MCHC 31.5 30.0 - 36.0 g/dL   RDW 15.7 (H) 11.5 - 15.5 %   Platelets 260 150 - 400 K/uL   nRBC 0.0 0.0 - 0.2 %  Basic metabolic panel  Result Value Ref Range   Sodium 135 135 - 145 mmol/L   Potassium 3.5 3.5 - 5.1 mmol/L   Chloride 104 98 - 111 mmol/L   CO2 26 22 - 32 mmol/L   Glucose, Bld 98 70 - 99 mg/dL   BUN 9 8 - 23 mg/dL   Creatinine, Ser 0.74 0.44 - 1.00 mg/dL   Calcium 8.4 (L) 8.9 - 10.3 mg/dL   GFR, Estimated >60 >60 mL/min   Anion gap 5 5 - 15    Discharge Medications:   Allergies as of 12/03/2021       Reactions   Morphine And Related Diarrhea, Nausea And Vomiting, Swelling   Penicillins Swelling, Other (See Comments)   Comatose for 2 days    Pine Shortness Of Breath   Shellfish Allergy Shortness Of Breath,  Swelling   Lactose Intolerance (gi) Other (See Comments)   GI upset   Miralax [polyethylene Glycol] Itching   Nickel Other (See Comments)   Pt states she gets abscess from the earrings with nickel    Other Itching, Nausea And Vomiting   Mushrooms - feels high, and dizzy   Grapeseed Extract [nutritional Supplements] Rash   grapes   Latex Itching, Rash   "IF" condom, it makes it painful   Red Dye Swelling, Rash   The dye for checking thyroid.  Red Cast Dye   Sulfites Hives, Rash   Tingling in mouth   Wheat Bran Rash, Other (See Comments)    tingling in mouth, vomiting        Medication List     STOP taking these medications    alendronate 70 MG tablet Commonly known as: FOSAMAX   ibuprofen 600 MG tablet Commonly known as: ADVIL   Omega-3 1000 MG Caps       TAKE these medications    acetaminophen 325 MG tablet Commonly known as: Tylenol Take 1 tablet (325 mg total) by mouth every 8 (eight) hours. What changed:  how much to take when to take this reasons to take this   albuterol 108 (90 Base) MCG/ACT inhaler Commonly known as: ProAir HFA Inhale 2 puffs into the lungs every 6 (six) hours as needed for wheezing or shortness of breath. What changed: when to take this   CALCIUM PO Take 1 tablet by mouth in the morning and at bedtime.   cetirizine 10 MG tablet Commonly known as: ZYRTEC Take 10 mg by mouth daily.   famotidine 20 MG tablet Commonly known as: PEPCID TAKE 1 TABLET BY MOUTH  TWICE DAILY What changed: when to take this   Flovent HFA 110 MCG/ACT inhaler Generic drug: fluticasone Inhale 2 puffs into the lungs 2 (two) times daily as needed. What changed:  when to take this reasons to take this   fluticasone 50 MCG/ACT nasal spray Commonly known as: FLONASE Place 1 spray into both nostrils 3 (three) times daily as needed for allergies or rhinitis.   gabapentin 300 MG capsule Commonly known as: NEURONTIN Take 1 capsule (300 mg total) by mouth  2 (two) times daily. What changed:  when to take this reasons to take this additional instructions   hydrOXYzine 10 MG tablet Commonly  known as: ATARAX TAKE 1 TABLET BY MOUTH  DAILY AS NEEDED FOR ITCHING OR ANXIETY What changed:  how much to take how to take this when to take this additional instructions   losartan-hydrochlorothiazide 100-25 MG tablet Commonly known as: HYZAAR Take 1 tablet by mouth daily.   methocarbamol 500 MG tablet Commonly known as: ROBAXIN Take 500 mg by mouth daily as needed for muscle spasms.   montelukast 10 MG tablet Commonly known as: SINGULAIR TAKE 1 TABLET BY MOUTH  DAILY AS NEEDED FOR  WHEEZING What changed:  how much to take how to take this when to take this additional instructions   multivitamin tablet Take 1 tablet by mouth daily.   oxyCODONE 5 MG immediate release tablet Commonly known as: Oxy IR/ROXICODONE Take 1-2 tablets (5-10 mg total) by mouth every 4 (four) hours as needed for moderate pain (pain score 4-6).   rivaroxaban 10 MG Tabs tablet Commonly known as: XARELTO Take 1 tablet (10 mg total) by mouth daily with breakfast.   TUMS PO Take 2 tablets by mouth 3 (three) times daily as needed (reflux).   vitamin D3 50 MCG (2000 UT) Caps Take 2,000 Units by mouth daily.        Diagnostic Studies: No results found.  Disposition: Discharge disposition: 03-Skilled Nursing Facility       Discharge Instructions     Call MD / Call 911   Complete by: As directed    If you experience chest pain or shortness of breath, CALL 911 and be transported to the hospital emergency room.  If you develope a fever above 101 F, pus (white drainage) or increased drainage or redness at the wound, or calf pain, call your surgeon's office.   Constipation Prevention   Complete by: As directed    Drink plenty of fluids.  Prune juice may be helpful.  You may use a stool softener, such as Colace (over the counter) 100 mg twice a day.  Use  MiraLax (over the counter) for constipation as needed.   Diet - low sodium heart healthy   Complete by: As directed    Increase activity slowly as tolerated   Complete by: As directed    Post-operative opioid taper instructions:   Complete by: As directed    POST-OPERATIVE OPIOID TAPER INSTRUCTIONS: It is important to wean off of your opioid medication as soon as possible. If you do not need pain medication after your surgery it is ok to stop day one. Opioids include: Codeine, Hydrocodone(Norco, Vicodin), Oxycodone(Percocet, oxycontin) and hydromorphone amongst others.  Long term and even short term use of opiods can cause: Increased pain response Dependence Constipation Depression Respiratory depression And more.  Withdrawal symptoms can include Flu like symptoms Nausea, vomiting And more Techniques to manage these symptoms Hydrate well Eat regular healthy meals Stay active Use relaxation techniques(deep breathing, meditating, yoga) Do Not substitute Alcohol to help with tapering If you have been on opioids for less than two weeks and do not have pain than it is ok to stop all together.  Plan to wean off of opioids This plan should start within one week post op of your joint replacement. Maintain the same interval or time between taking each dose and first decrease the dose.  Cut the total daily intake of opioids by one tablet each day Next start to increase the time between doses. The last dose that should be eliminated is the evening dose.  Contact information for after-discharge care     Destination     [facility] .   Service: Skilled Nursing Contact information: [address] [place] [address] [phone]                      Signed: Burnard Bunting 12/03/2021, 12:03 PM

## 2021-12-03 NOTE — Progress Notes (Signed)
Physical Therapy Treatment Patient Details Name: Cheryl Prince MRN: [id number] DOB: 21-Sep-1955 Today's Date: 12/03/2021   History of Present Illness 66 yo female with failed conservative measures for her R knee was admitted on 10/5 for TKA on RLE.  Pt has PMHx:  bipolar 1 disorder, bronchitis, GERD, PTSD, CTS, onychomycosis, HTN, L rotator cuff tendonitis, L knee TKA,    PT Comments    Continuing work on functional mobility and activity tolerance;  Session focused on progressive amb after therapeutic exercises (mostly aimed at knee extension stability, but we also worked on knee flexion in sitting EOB); Assessed R knee single limb stance stability at EOB (holding on to RW), and opted to walk in the hallway without KI; short, guarded steps, but nice, stable knee in stance  Recommendations for follow up therapy are one component of a multi-disciplinary discharge planning process, led by the attending physician.  Recommendations may be updated based on patient status, additional functional criteria and insurance authorization.  Follow Up Recommendations  Follow physician's recommendations for discharge plan and follow up therapies     Assistance Recommended at Discharge Frequent or constant Supervision/Assistance  Patient can return home with the following A lot of help with walking and/or transfers;A lot of help with bathing/dressing/bathroom;Assistance with cooking/housework;Assist for transportation;Help with stairs or ramp for entrance   Equipment Recommendations  None recommended by PT    Recommendations for Other Services       Precautions / Restrictions Precautions Precautions: Knee Precaution Booklet Issued: Yes (comment) Precaution Comments: Pt educated to not allow any pillow or bolster under knee for healing with optimal range of motion.  Restrictions Other Position/Activity Restrictions: WBAT     Mobility  Bed Mobility Overal bed mobility: Needs Assistance Bed  Mobility: Supine to Sit     Supine to sit: Min guard     General bed mobility comments: Uses LLE to suport RLE coming off of the bed    Transfers Overall transfer level: Needs assistance Equipment used: Rolling walker (2 wheels) Transfers: Sit to/from Stand Sit to Stand: Min assist, Min guard           General transfer comment: Min assist to stnad from the bed to steady RW; minguard to stand from recliner; good use of armrests to push up    Ambulation/Gait Ambulation/Gait assistance: Min guard Gait Distance (Feet): 75 Feet Assistive device: Rolling walker (2 wheels) Gait Pattern/deviations: Step-through pattern, Decreased step length - right, Decreased step length - left, Decreased stride length (emerging) Gait velocity: reduced     General Gait Details: Cues to activate quad for stance stability; Overall nice, stable knee in stance   Stairs             Wheelchair Mobility    Modified Rankin (Stroke Patients Only)       Balance     Sitting balance-Leahy Scale: Fair       Standing balance-Leahy Scale: Poor (approaching Fair)                              Cognition Arousal/Alertness: Awake/alert Behavior During Therapy: WFL for tasks assessed/performed Overall Cognitive Status: Within Functional Limits for tasks assessed                                          Exercises Total Joint Exercises Ankle Circles/Pumps:  AROM, Both, 10 reps Quad Sets: AROM, Left, 10 reps Short Arc Quad: AAROM, Left, 10 reps Hip ABduction/ADduction: AROM, Left, 5 reps Straight Leg Raises: AAROM, Left, 5 reps Knee Flexion: AROM, AAROM, Left, 5 reps, Seated (with using LLE to pull R knee into more flexion for self-AAROM) Goniometric ROM: approx 0-65 degrees    General Comments General comments (skin integrity, edema, etc.): Assisted pt to [facility], and tehn to bathroom to wash hands at end of session      Pertinent Vitals/Pain Pain  Assessment Pain Assessment: Faces Faces Pain Scale: Hurts little more Pain Location: R knee with movement Pain Descriptors / Indicators: Operative site guarding, Grimacing Pain Intervention(s): Monitored during session    Home Living                          Prior Function            PT Goals (current goals can now be found in the care plan section) Acute Rehab PT Goals Patient Stated Goal: Back to walking PT Goal Formulation: With patient Time For Goal Achievement: 12/14/21 Potential to Achieve Goals: Good Progress towards PT goals: Progressing toward goals    Frequency    7X/week      PT Plan Current plan remains appropriate    Co-evaluation              AM-PAC PT "6 Clicks" Mobility   Outcome Measure  Help needed turning from your back to your side while in a flat bed without using bedrails?: A Little Help needed moving from lying on your back to sitting on the side of a flat bed without using bedrails?: A Little Help needed moving to and from a bed to a chair (including a wheelchair)?: A Little Help needed standing up from a chair using your arms (e.g., wheelchair or bedside chair)?: A Little Help needed to walk in hospital room?: A Little Help needed climbing 3-5 steps with a railing? : A Lot 6 Click Score: 17    End of Session Equipment Utilized During Treatment: Gait belt Activity Tolerance: Patient tolerated treatment well Patient left: in chair;with call bell/phone within reach Nurse Communication: Mobility status PT Visit Diagnosis: Unsteadiness on feet (R26.81);Muscle weakness (generalized) (M62.81);Pain;Other abnormalities of gait and mobility (R26.89) Pain - Right/Left: Right Pain - part of body: Knee     Time: 2951-8841 PT Time Calculation (min) (ACUTE ONLY): 57 min  Charges:  $Gait Training: 23-37 mins $Therapeutic Exercise: 8-22 mins $Therapeutic Activity: 8-22 mins                     Roney Marion, PT  Acute  Rehabilitation Services Office [phone]    Cheryl Prince 12/03/2021, 12:27 PM

## 2021-12-10 ENCOUNTER — Telehealth: Payer: Self-pay | Admitting: Orthopedic Surgery

## 2021-12-10 NOTE — Telephone Encounter (Signed)
Pt called and states she has removed herself from the rehab facility. The facility the pt was going to was [facility]. She states that this facility was over medication her, giving her things she was allergic to, she also had to do everything herself.She also is concerned because she has some warmth to her leg so she was wondering if she could have an antibiotic called in? She states that she still has her DPM machine and she is doing it 3 times a day for 2 hours. She is at 80 degrees right now.   CB [phone]

## 2021-12-10 NOTE — Telephone Encounter (Signed)
No fevers, chills, redness. Pain improving. No sign of infection.  Follow-up Friday.

## 2021-12-12 ENCOUNTER — Telehealth: Payer: Self-pay | Admitting: Emergency Medicine

## 2021-12-12 NOTE — Telephone Encounter (Signed)
Copied from [place] [id number]. Topic: General - Other >> Dec 12, 2021  8:35 AM Chapman Fitch wrote: Reason for CRM: Pt needs orders for home health / for assistance at home / please advise asap / she also has a question about one of her medications

## 2021-12-12 NOTE — Discharge Summary (Signed)
Physician Discharge Summary      Patient ID: Cheryl Prince MRN: [id number] DOB/AGE: 1956/02/21 66 y.o.  Admit date: 11/29/2021 Discharge date: 12/03/21  Admission Diagnoses:  Principal Problem:   Arthritis of knee   Discharge Diagnoses:  Same  Surgeries: Procedure(s): RIGHT TOTAL KNEE ARTHROPLASTY on 11/29/2021   Consultants:   Discharged Condition: Stable  Hospital Course: Cheryl Prince is an 66 y.o. female who was admitted 11/29/2021 with a chief complaint of right knee pain, and found to have a diagnosis of right knee OA.  They were brought to the operating room on 11/29/2021 and underwent the above named procedures.  Pt awoke from anesthesia without complication and was transferred to the floor. On POD1, patient's pain was controlled. She was able to mobilize with PT but she was discharged to SNF on 12/03/21 due to social issues (living alone).  No red flag symptoms thorughout her stay.  Pt will f/u with Dr. August Saucer in clinic in ~2 weeks.   Antibiotics given:  Anti-infectives (From admission, onward)    Start     Dose/Rate Route Frequency Ordered Stop   11/29/21 2200  vancomycin (VANCOCIN) IVPB 1000 mg/200 mL premix        1,000 mg 200 mL/hr over 60 Minutes Intravenous Every 12 hours 11/29/21 2110 11/30/21 0759   11/29/21 1256  vancomycin (VANCOCIN) powder  Status:  Discontinued          As needed 11/29/21 1257 11/29/21 1400   11/29/21 1000  Vancomycin (VANCOCIN) 1,500 mg in sodium chloride 0.9 % 500 mL IVPB        1,500 mg 250 mL/hr over 120 Minutes Intravenous On call to O.R. 11/29/21 4132 11/29/21 1258     .  Recent vital signs:  Vitals:   12/03/21 0846 12/03/21 1043  BP:  111/65  Pulse:  91  Resp:    Temp:    SpO2: 96% 98%    Recent laboratory studies:  Results for orders placed or performed during the hospital encounter of 11/20/21  Urine Culture   Specimen: Urine, Clean Catch  Result Value Ref Range   Specimen Description URINE, CLEAN  CATCH    Special Requests NONE    Culture      NO GROWTH Performed at [facility], [address], [place], [place] [address]    Report Status 11/21/2021 FINAL   Surgical pcr screen   Specimen: Nasal Mucosa; Nasal Swab  Result Value Ref Range   MRSA, PCR NEGATIVE NEGATIVE   Staphylococcus aureus NEGATIVE NEGATIVE  Urinalysis, Complete w Microscopic  Result Value Ref Range   Color, Urine YELLOW YELLOW   APPearance CLEAR CLEAR   Specific Gravity, Urine 1.020 1.005 - 1.030   pH 6.5 5.0 - 8.0   Glucose, UA NEGATIVE NEGATIVE mg/dL   Hgb urine dipstick NEGATIVE NEGATIVE   Bilirubin Urine NEGATIVE NEGATIVE   Ketones, ur NEGATIVE NEGATIVE mg/dL   Protein, ur NEGATIVE NEGATIVE mg/dL   Nitrite NEGATIVE NEGATIVE   Leukocytes,Ua NEGATIVE NEGATIVE   Squamous Epithelial / LPF 0-5 0 - 5   WBC, UA 0-5 0 - 5 WBC/hpf   RBC / HPF 0-5 0 - 5 RBC/hpf   Bacteria, UA RARE (A) NONE SEEN   Mucus PRESENT   CBC  Result Value Ref Range   WBC 4.1 4.0 - 10.5 K/uL   RBC 4.56 3.87 - 5.11 MIL/uL   Hemoglobin 11.1 (L) 12.0 - 15.0 g/dL   HCT 27.2 (L) 53.6 - 64.4 %  MCV 77.2 (L) 80.0 - 100.0 fL   MCH 24.3 (L) 26.0 - 34.0 pg   MCHC 31.5 30.0 - 36.0 g/dL   RDW 61.6 (H) 07.3 - 71.0 %   Platelets 260 150 - 400 K/uL   nRBC 0.0 0.0 - 0.2 %  Basic metabolic panel  Result Value Ref Range   Sodium 135 135 - 145 mmol/L   Potassium 3.5 3.5 - 5.1 mmol/L   Chloride 104 98 - 111 mmol/L   CO2 26 22 - 32 mmol/L   Glucose, Bld 98 70 - 99 mg/dL   BUN 9 8 - 23 mg/dL   Creatinine, Ser 6.26 0.44 - 1.00 mg/dL   Calcium 8.4 (L) 8.9 - 10.3 mg/dL   GFR, Estimated >94 >85 mL/min   Anion gap 5 5 - 15    Discharge Medications:   Allergies as of 12/03/2021       Reactions   Morphine And Related Diarrhea, Nausea And Vomiting, Swelling   Penicillins Swelling, Other (See Comments)   Comatose for 2 days    Pine Shortness Of Breath   Shellfish Allergy Shortness Of Breath, Swelling   Lactose Intolerance (gi) Other  (See Comments)   GI upset   Miralax [polyethylene Glycol] Itching   Nickel Other (See Comments)   Pt states she gets abscess from the earrings with nickel    Other Itching, Nausea And Vomiting   Mushrooms - feels high, and dizzy   Grapeseed Extract [nutritional Supplements] Rash   grapes   Latex Itching, Rash   "IF" condom, it makes it painful   Red Dye Swelling, Rash   The dye for checking thyroid.  Red Cast Dye   Sulfites Hives, Rash   Tingling in mouth   Wheat Bran Rash, Other (See Comments)    tingling in mouth, vomiting        Medication List     STOP taking these medications    alendronate 70 MG tablet Commonly known as: FOSAMAX   ibuprofen 600 MG tablet Commonly known as: ADVIL   Omega-3 1000 MG Caps       TAKE these medications    acetaminophen 325 MG tablet Commonly known as: Tylenol Take 1 tablet (325 mg total) by mouth every 8 (eight) hours. What changed:  how much to take when to take this reasons to take this   albuterol 108 (90 Base) MCG/ACT inhaler Commonly known as: ProAir HFA Inhale 2 puffs into the lungs every 6 (six) hours as needed for wheezing or shortness of breath. What changed: when to take this   CALCIUM PO Take 1 tablet by mouth in the morning and at bedtime.   cetirizine 10 MG tablet Commonly known as: ZYRTEC Take 10 mg by mouth daily.   famotidine 20 MG tablet Commonly known as: PEPCID TAKE 1 TABLET BY MOUTH  TWICE DAILY What changed: when to take this   Flovent HFA 110 MCG/ACT inhaler Generic drug: fluticasone Inhale 2 puffs into the lungs 2 (two) times daily as needed. What changed:  when to take this reasons to take this   fluticasone 50 MCG/ACT nasal spray Commonly known as: FLONASE Place 1 spray into both nostrils 3 (three) times daily as needed for allergies or rhinitis.   gabapentin 300 MG capsule Commonly known as: NEURONTIN Take 1 capsule (300 mg total) by mouth 2 (two) times daily. What changed:  when  to take this reasons to take this additional instructions   hydrOXYzine 10 MG tablet Commonly  known as: ATARAX TAKE 1 TABLET BY MOUTH  DAILY AS NEEDED FOR ITCHING OR ANXIETY What changed:  how much to take how to take this when to take this additional instructions   losartan-hydrochlorothiazide 100-25 MG tablet Commonly known as: HYZAAR Take 1 tablet by mouth daily.   methocarbamol 500 MG tablet Commonly known as: ROBAXIN Take 500 mg by mouth daily as needed for muscle spasms.   montelukast 10 MG tablet Commonly known as: SINGULAIR TAKE 1 TABLET BY MOUTH  DAILY AS NEEDED FOR  WHEEZING What changed:  how much to take how to take this when to take this additional instructions   multivitamin tablet Take 1 tablet by mouth daily.   oxyCODONE 5 MG immediate release tablet Commonly known as: Oxy IR/ROXICODONE Take 1-2 tablets (5-10 mg total) by mouth every 4 (four) hours as needed for moderate pain (pain score 4-6).   rivaroxaban 10 MG Tabs tablet Commonly known as: XARELTO Take 1 tablet (10 mg total) by mouth daily with breakfast.   TUMS PO Take 2 tablets by mouth 3 (three) times daily as needed (reflux).   vitamin D3 50 MCG (2000 UT) Caps Take 2,000 Units by mouth daily.        Diagnostic Studies: No results found.  Disposition: Discharge disposition: 03-Skilled Nursing Facility       Discharge Instructions     Call MD / Call 911   Complete by: As directed    If you experience chest pain or shortness of breath, CALL 911 and be transported to the hospital emergency room.  If you develope a fever above 101 F, pus (white drainage) or increased drainage or redness at the wound, or calf pain, call your surgeon's office.   Constipation Prevention   Complete by: As directed    Drink plenty of fluids.  Prune juice may be helpful.  You may use a stool softener, such as Colace (over the counter) 100 mg twice a day.  Use MiraLax (over the counter) for constipation  as needed.   Diet - low sodium heart healthy   Complete by: As directed    Increase activity slowly as tolerated   Complete by: As directed    Post-operative opioid taper instructions:   Complete by: As directed    POST-OPERATIVE OPIOID TAPER INSTRUCTIONS: It is important to wean off of your opioid medication as soon as possible. If you do not need pain medication after your surgery it is ok to stop day one. Opioids include: Codeine, Hydrocodone(Norco, Vicodin), Oxycodone(Percocet, oxycontin) and hydromorphone amongst others.  Long term and even short term use of opiods can cause: Increased pain response Dependence Constipation Depression Respiratory depression And more.  Withdrawal symptoms can include Flu like symptoms Nausea, vomiting And more Techniques to manage these symptoms Hydrate well Eat regular healthy meals Stay active Use relaxation techniques(deep breathing, meditating, yoga) Do Not substitute Alcohol to help with tapering If you have been on opioids for less than two weeks and do not have pain than it is ok to stop all together.  Plan to wean off of opioids This plan should start within one week post op of your joint replacement. Maintain the same interval or time between taking each dose and first decrease the dose.  Cut the total daily intake of opioids by one tablet each day Next start to increase the time between doses. The last dose that should be eliminated is the evening dose.  Contact information for after-discharge care     Destination     [facility] .   Service: Skilled Nursing Contact information: [address] [place] [address] [phone]                      Signed: Karenann Cai 12/12/2021, 7:21 PM

## 2021-12-14 ENCOUNTER — Ambulatory Visit (INDEPENDENT_AMBULATORY_CARE_PROVIDER_SITE_OTHER): Payer: Medicare Other | Admitting: Surgical

## 2021-12-14 ENCOUNTER — Other Ambulatory Visit: Payer: Self-pay

## 2021-12-14 ENCOUNTER — Ambulatory Visit (INDEPENDENT_AMBULATORY_CARE_PROVIDER_SITE_OTHER): Payer: Medicare Other

## 2021-12-14 ENCOUNTER — Telehealth: Payer: Self-pay | Admitting: Surgical

## 2021-12-14 DIAGNOSIS — Z96651 Presence of right artificial knee joint: Secondary | ICD-10-CM | POA: Diagnosis not present

## 2021-12-14 DIAGNOSIS — M1712 Unilateral primary osteoarthritis, left knee: Secondary | ICD-10-CM

## 2021-12-14 MED ORDER — ASPIRIN 81 MG PO CHEW: 81.0000 mg | CHEWABLE_TABLET | Freq: Every day | ORAL | 0 refills | Status: DC

## 2021-12-14 MED ORDER — OXYCODONE-ACETAMINOPHEN 5-325 MG PO TABS: 1.0000 | ORAL_TABLET | Freq: Four times a day (QID) | ORAL | 0 refills | Status: DC | PRN

## 2021-12-14 NOTE — Telephone Encounter (Signed)
Patient called in stating she needs her order for her Rx Lurena Joiner was going to put in for her ASAP she is currently sitting at [facility] and she stated she may not have another ride to pick up medication, I informed patient we were still in the mist of clinic and they would get to it but she was very impatient, please advise

## 2021-12-14 NOTE — Telephone Encounter (Signed)
Rx sent in [place] called patient

## 2021-12-15 ENCOUNTER — Encounter: Payer: Self-pay | Admitting: Orthopedic Surgery

## 2021-12-15 NOTE — Progress Notes (Signed)
Post-Op Visit Note   Patient: Cheryl Prince           Date of Birth: 16-Nov-1955           MRN: [id number] Visit Date: 12/14/2021 PCP: Elsie Stain, MD   Assessment & Plan:  Chief Complaint:  Chief Complaint  Patient presents with   Right Knee - Routine Post Op    11/29/21 right TKA   Visit Diagnoses:  1. Hx of total knee arthroplasty, right     Plan: Cheryl Prince is a 66 y.o. female who presents s/p right total knee arthroplasty on 11/29/2021.  Doing well overall.  Using CPM machine up to 88 degrees.  They deny any calf pain, shortness of breath, chest pain, abdominal pain.  Pain is overall controlled and taking oxycodone, Robaxin, gabapentin for pain control.  Taking nothing for DVT prophylaxis; Xarelto was prescribed for her due to her family history of DVT but she never picked this up at the pharmacy.  Ambulating with walker.   On exam, patient has range of motion 3 degrees extension to 85 degrees of knee flexion.  Incision is healing well without evidence of infection or dehiscence.  2+ DP pulse of the operative extremity.  No calf tenderness, negative Homans' sign.  Able to perform straight leg raise.  Intact ankle dorsiflexion.  Plan is start outpatient physical therapy at [facility]. which is the location she did physical therapy after her last knee replacement.  She was prescribed Xarelto but never picked it up or took it so she has not really been taking anything for DVT prophylaxis after knee replacement.  Prescribed aspirin 81 mg to take once daily until she is back to baseline mobility.  She will be set up for ultrasound of the bilateral lower extremities on Monday to rule out DVT though clinical suspicion is low.  Refilled oxycodone.  Counseled her about the signs or symptoms of DVT/PE and she will reach out to the office over the weekend if she notices any worsening calf, chest pain or shortness of breath.  Radiographs of the right knee demonstrate  total knee prosthesis in good position and alignment without any complicating features.  Follow-up in 4 weeks..    Follow-Up Instructions: No follow-ups on file.   Orders:  Orders Placed This Encounter  Procedures   XR Knee 1-2 Views Right   Ambulatory referral to Physical Therapy   Meds ordered this encounter  Medications   oxyCODONE-acetaminophen (PERCOCET) 5-325 MG tablet    Sig: Take 1 tablet by mouth every 6 (six) hours as needed for severe pain.    Dispense:  30 tablet    Refill:  0   aspirin (ASPIRIN CHILDRENS) 81 MG chewable tablet    Sig: Chew 1 tablet (81 mg total) by mouth daily. To prevent blood clots    Dispense:  30 tablet    Refill:  0    Imaging: No results found.  PMFS History: Patient Active Problem List   Diagnosis Date Noted   Arthritis of knee 11/29/2021   Bipolar I disorder ([place]) 07/26/2020   Right hip pain 07/26/2020   Bronchitis 07/26/2020   Allergies 07/26/2020   GERD (gastroesophageal reflux disease) 07/26/2020   At risk for decreased bone density 07/26/2020   PTSD (post-traumatic stress disorder) 05/30/2016   Alpha thalassemia trait 05/30/2016   Hypertension 05/30/2016   Rotator cuff tendonitis, left 05/30/2016   Osteoarthritis of left knee 05/30/2016   Carpal tunnel syndrome 05/30/2016  Prediabetes 05/30/2016   Onychomycosis 05/30/2016   Past Medical History:  Diagnosis Date   Allergy    Anemia    Anxiety    Arthritis    Asthma    borderline bronchitis   Bronchitis    GERD (gastroesophageal reflux disease)    Goiter, toxic diffuse 05/30/2016   Hypertension    S/P tonsillectomy 05/30/2016   Sleep apnea    history of, MD tookpt. off machine 7 yrs, ago    Family History  Problem Relation Age of Onset   Hypertension Father    Diabetes Father    Lupus Daughter    Colon cancer Maternal Aunt    Colon cancer Maternal Uncle        4 mat uncles dx colon ca   Esophageal cancer Neg Hx    Rectal cancer Neg Hx    Stomach cancer Neg Hx      Past Surgical History:  Procedure Laterality Date   ABDOMINAL HYSTERECTOMY     adnoides     BREAST SURGERY     CHOLECYSTECTOMY     COLONOSCOPY     DILATION AND CURETTAGE OF UTERUS     GASTRIC BYPASS  1998   IUD REMOVAL     TONSILLECTOMY     TOTAL KNEE ARTHROPLASTY Left 09/22/2018   TOTAL KNEE ARTHROPLASTY Left 09/22/2018   Procedure: LEFT TOTAL KNEE ARTHROPLASTY;  Surgeon: Cammy Copa, MD;  Location: [facility] OR;  Service: Orthopedics;  Laterality: Left;   TOTAL KNEE ARTHROPLASTY Right 11/29/2021   Procedure: RIGHT TOTAL KNEE ARTHROPLASTY;  Surgeon: Cammy Copa, MD;  Location: [facility];  Service: Orthopedics;  Laterality: Right;   Social History   Occupational History   Not on file  Tobacco Use   Smoking status: Former    Packs/day: 1.50    Years: 18.00    Total pack years: 27.00    Types: Cigarettes    Quit date: 09/16/1984    Years since quitting: 37.2   Smokeless tobacco: Never   Tobacco comments:    quit 1986  Vaping Use   Vaping Use: Never used  Substance and Sexual Activity   Alcohol use: Yes    Comment: social   Drug use: No   Sexual activity: Not on file

## 2021-12-20 ENCOUNTER — Encounter: Payer: Medicare Other | Admitting: Physician Assistant

## 2021-12-20 ENCOUNTER — Other Ambulatory Visit: Payer: Self-pay

## 2021-12-20 ENCOUNTER — Ambulatory Visit (HOSPITAL_COMMUNITY)
Admission: RE | Admit: 2021-12-20 | Discharge: 2021-12-20 | Disposition: A | Discharge status: Home / Self Care | Payer: Medicare Other | Source: Ambulatory Visit | Attending: Surgical | Admitting: Surgical

## 2021-12-20 ENCOUNTER — Other Ambulatory Visit: Payer: Self-pay | Admitting: Surgical

## 2021-12-20 DIAGNOSIS — M1712 Unilateral primary osteoarthritis, left knee: Secondary | ICD-10-CM | POA: Insufficient documentation

## 2021-12-20 DIAGNOSIS — Z96651 Presence of right artificial knee joint: Secondary | ICD-10-CM | POA: Insufficient documentation

## 2021-12-20 MED ORDER — RIVAROXABAN (XARELTO) VTE STARTER PACK (15 & 20 MG): ORAL_TABLET | ORAL | 0 refills | Status: DC

## 2021-12-20 NOTE — Progress Notes (Signed)
Not seen Patient will be seen in person next week

## 2021-12-20 NOTE — Progress Notes (Signed)
Patient called stated pharmacy did not have rx for xarelto and wanted sent to [facility], I submitted.

## 2021-12-20 NOTE — Progress Notes (Signed)
Bilateral lower extremity venous duplex completed. Refer to "CV Proc" under chart review to view preliminary results.  Preliminary results discussed with Rica Mote of [facility]  12/20/2021 9:58 AM Kelby Aline., MHA, RVT, RDCS, RDMS

## 2021-12-20 NOTE — Telephone Encounter (Signed)
Called patient and put her on the schedule for later this afternoon

## 2021-12-20 NOTE — Progress Notes (Signed)
Thank you :)

## 2021-12-21 ENCOUNTER — Ambulatory Visit: Payer: Medicare Other | Admitting: Physical Therapy

## 2021-12-21 ENCOUNTER — Telehealth: Payer: Self-pay | Admitting: Physical Therapy

## 2021-12-21 NOTE — Telephone Encounter (Signed)
Patient has an acute DVT.  PT is planning to hold evaluation today due to being on medication <24hrs.  Office planning to reschedule her evaluation for next week to allow therapeutic window for medication.  Will she be following up with you and are there any precautions should we see her prior to that follow-up?  Thank you, Elease Etienne, PT, DPT

## 2021-12-21 NOTE — Telephone Encounter (Signed)
PT spoke to pt about DVT found 10/27 and newly initiated treatment as of yesterday afternoon resulting in cancellation of evaluation today.  Will hold PT evaluation until pt considered therapeutic and appropriate for evaluation.  Will reach out to MD in regards to timeframe.  Elease Etienne, PT, DPT

## 2021-12-24 ENCOUNTER — Telehealth: Payer: Self-pay | Admitting: Emergency Medicine

## 2021-12-24 ENCOUNTER — Encounter: Payer: Self-pay | Admitting: Surgical

## 2021-12-24 ENCOUNTER — Telehealth: Payer: Self-pay

## 2021-12-24 ENCOUNTER — Other Ambulatory Visit: Payer: Self-pay | Admitting: Surgical

## 2021-12-24 MED ORDER — APIXABAN (ELIQUIS) VTE STARTER PACK (10MG AND 5MG): ORAL_TABLET | ORAL | 0 refills | Status: DC

## 2021-12-24 NOTE — Telephone Encounter (Signed)
I would recommend Cheryl Prince let her PCP know today about this reaction and I can send in a prescription for Eliquis to see if she will tolerate this medication better without any reaction. She should be able to start PT after a few days of anticoagulation

## 2021-12-24 NOTE — Telephone Encounter (Signed)
Copied from [place]. Topic: Referral - Status >> Dec 24, 2021 10:22 AM Cyndi Bender wrote: Reason for CRM: Pt requests referral for home health for assistance at home

## 2021-12-24 NOTE — Telephone Encounter (Signed)
I called her and she confirmed no reaction if she does not take the Tylenol with the Xarelto.  I encouraged her to continue taking Xarelto and not pick up the Eliquis.  She will let us know if she has any continued reactions

## 2021-12-24 NOTE — Telephone Encounter (Addendum)
Patient called stating that she is having an allergic reaction from the taking the Xarelto.  Stated that she started breaking out and itching after taking the medication the second time.  Would like to know about starting PT?  Cb# [phone].  Please advise.  Thank you.

## 2021-12-24 NOTE — Telephone Encounter (Signed)
IC talked with patient and advised. She stated she found out that she does not have reaction if she takes BT's without Tylenol.

## 2021-12-24 NOTE — Telephone Encounter (Signed)
She is following up with [place] on 11/17 and she has follow-up with her PCP regarding her DVT.  She is having reaction to xarelto so we are switching to eliquis to see if she can tolerate this better.  As long as she can stay on that, she should be good for PT later this week.  Hope this helps, thanks!

## 2021-12-25 ENCOUNTER — Ambulatory Visit: Payer: Medicare Other | Attending: Surgical | Admitting: Physical Therapy

## 2021-12-25 ENCOUNTER — Other Ambulatory Visit: Payer: Self-pay

## 2021-12-25 ENCOUNTER — Telehealth: Payer: Self-pay

## 2021-12-25 ENCOUNTER — Encounter: Payer: Self-pay | Admitting: Physical Therapy

## 2021-12-25 ENCOUNTER — Other Ambulatory Visit: Payer: Self-pay | Admitting: Surgical

## 2021-12-25 ENCOUNTER — Telehealth: Payer: Self-pay | Admitting: Orthopedic Surgery

## 2021-12-25 DIAGNOSIS — M25661 Stiffness of right knee, not elsewhere classified: Secondary | ICD-10-CM | POA: Diagnosis present

## 2021-12-25 DIAGNOSIS — M6281 Muscle weakness (generalized): Secondary | ICD-10-CM | POA: Diagnosis present

## 2021-12-25 DIAGNOSIS — M25561 Pain in right knee: Secondary | ICD-10-CM | POA: Insufficient documentation

## 2021-12-25 DIAGNOSIS — Z96651 Presence of right artificial knee joint: Secondary | ICD-10-CM | POA: Insufficient documentation

## 2021-12-25 DIAGNOSIS — R2689 Other abnormalities of gait and mobility: Secondary | ICD-10-CM | POA: Insufficient documentation

## 2021-12-25 MED ORDER — APIXABAN (ELIQUIS) VTE STARTER PACK (10MG AND 5MG): ORAL_TABLET | ORAL | 0 refills | Status: DC

## 2021-12-25 NOTE — Telephone Encounter (Signed)
Patient called concerning Rx for Eliquis  being sent to her pharmacy.  CB# [phone].  Please advise.  Thank you.

## 2021-12-25 NOTE — Telephone Encounter (Signed)
He sent me message on Epic and I responded

## 2021-12-25 NOTE — Telephone Encounter (Signed)
Chris from [facility] called and was wondering if pt could still attend her evaluation today? He noticed she had a reaction to her blood thinner and was just making sure it would be okay.   CB [phone]  Also can message therapist through Epic

## 2021-12-25 NOTE — Therapy (Signed)
OUTPATIENT PHYSICAL THERAPY LOWER EXTREMITY EVALUATION   Patient Name: Cheryl Prince MRN: [id number] DOB:[birth year], 66 y.o., female Today's Date: 12/25/2021   PT End of Session - 12/25/21 1152     Visit Number 1    Number of Visits 17    Date for PT Re-Evaluation 02/19/22    Authorization Type [employer]    Progress Note Due on Visit 10    PT Start Time 1155    PT Stop Time 1241    PT Time Calculation (min) 46 min    Activity Tolerance Patient tolerated treatment well;No increased pain    Behavior During Therapy WFL for tasks assessed/performed             Past Medical History:  Diagnosis Date   Allergy    Anemia    Anxiety    Arthritis    Asthma    borderline bronchitis   Bronchitis    GERD (gastroesophageal reflux disease)    Goiter, toxic diffuse 05/30/2016   Hypertension    S/P tonsillectomy 05/30/2016   Sleep apnea    history of, MD tookpt. off machine 7 yrs, ago   Past Surgical History:  Procedure Laterality Date   ABDOMINAL HYSTERECTOMY     adnoides     BREAST SURGERY     CHOLECYSTECTOMY     COLONOSCOPY     DILATION AND CURETTAGE OF UTERUS     GASTRIC BYPASS  1998   IUD REMOVAL     TONSILLECTOMY     TOTAL KNEE ARTHROPLASTY Left 09/22/2018   TOTAL KNEE ARTHROPLASTY Left 09/22/2018   Procedure: LEFT TOTAL KNEE ARTHROPLASTY;  Surgeon: Cammy Copa, MD;  Location: [facility] OR;  Service: Orthopedics;  Laterality: Left;   TOTAL KNEE ARTHROPLASTY Right 11/29/2021   Procedure: RIGHT TOTAL KNEE ARTHROPLASTY;  Surgeon: Cammy Copa, MD;  Location: [facility] OR;  Service: Orthopedics;  Laterality: Right;   Patient Active Problem List   Diagnosis Date Noted   Arthritis of knee 11/29/2021   Bipolar I disorder (HCC) 07/26/2020   Right hip pain 07/26/2020   Bronchitis 07/26/2020   Allergies 07/26/2020   GERD (gastroesophageal reflux disease) 07/26/2020   At risk for decreased bone density 07/26/2020   PTSD (post-traumatic stress disorder)  05/30/2016   Alpha thalassemia trait 05/30/2016   Hypertension 05/30/2016   Rotator cuff tendonitis, left 05/30/2016   Osteoarthritis of left knee 05/30/2016   Carpal tunnel syndrome 05/30/2016   Prediabetes 05/30/2016   Onychomycosis 05/30/2016    PCP: Storm Frisk, MD  REFERRING PROVIDER: Julieanne Cotton, PA-C  REFERRING DIAG: [phone] (ICD-10-CM) - Hx of total knee arthroplasty, right  THERAPY DIAG:  Right knee pain, unspecified chronicity  Stiffness of right knee, not elsewhere classified  Muscle weakness (generalized)  Other abnormalities of gait and mobility  Rationale for Evaluation and Treatment: Rehabilitation  ONSET DATE: Oct 5th 2023  SUBJECTIVE:   SUBJECTIVE STATEMENT: Pt reports R TKA performed 11/29/21, was hospitalized for 5 days and went to SNF for 5 days before ultimately discharging home alone. Pt states that prior to surgery she was not using rollator but has been using since. Has improved w/ dressing and self care but continues to have difficulty with mobility greater than household distances and housework. Postoperatively pt was found to have DVT in surgical limb and was started on NOAC - initial concern over poor tolerance (hives) but pt states she has continued to take consistently and her provider encouraged her to continue activity/exercise  PERTINENT  HISTORY: HTN, recent DVT on anticoagulant, GERD, TKA 11/29/21 on RLE, prior L TKA PAIN:  Are you having pain: 6/10 Location: R TKA How would you describe your pain? Throbbing, sharp, pressure Best in past week: 0/10 Worst in past week: 8/10 Aggravating factors: stair navigation, walking more than household distances,  Easing factors: medication, rest, ice   PRECAUTIONS: Knee and Fall, recent DVT on anticoagulation  WEIGHT BEARING RESTRICTIONS: No  FALLS:  Has patient fallen in last 6 months? No  LIVING ENVIRONMENT: Lives with: lives alone Lives in: House/apartment Stairs: bedroom on  second floor 15 steps 1 rail; 1 STE home Has following equipment at home: rollator, shower seat  OCCUPATION: retired since 2005, was a Psychiatric nurse. Enjoys swimming and bowling, is a Psychologist, occupational for a basketball team, enjoys singing  PLOF: Independent  PATIENT GOALS: would like to get back to bowling and swimming, improve walking    OBJECTIVE:   PATIENT SURVEYS:  FOTO 33  COGNITION: Overall cognitive status: Within functional limits for tasks assessed     SENSATION: Light touch intact B LE although pt reports subjective numbness along lateral portion of incision  EDEMA/INSPECTION:  Circumferential: 38cm L joint line, 39.6 cm R joint line  12cm distal to joint line 38cm RLE, 38.5cm LLE Incision still has steri strips on distal half, although visible portion appears to be healing well   PALPATION: Palpable tightness quad/TFL and hamstrings on operative limb  LOWER EXTREMITY ROM:  Active ROM Right eval Left eval  Knee flexion 106 118  Knee extension -2 0  Ankle dorsiflexion    Ankle plantarflexion    Ankle inversion    Ankle eversion     (Blank rows = not tested)  LOWER EXTREMITY MMT:  MMT Right eval Left eval  Hip flexion    Hip extension    Hip abduction    Hip adduction    Hip internal rotation    Hip external rotation    Knee flexion  WFL  Knee extension  WFL  Ankle dorsiflexion    Ankle plantarflexion    Ankle inversion    Ankle eversion     (Blank rows = not tested) Comments: operative limb not tested given recency of DVT    FUNCTIONAL TESTS:  STS: standard chair, heavy UE support, weight shift to L, stooped posture prior to initiating upright on rollator  Will plan to test TUG vs 5xSTS next session  GAIT: Distance walked: within clinic Assistive device utilized: rollator Level of assistance: Modified independence Comments: reduced stance on R, reduced knee ROM on R, reduced hip extension B, increased lateral weight shifting   TODAY'S  TREATMENT:                                                                                                                              [facility] Adult PT Treatment:  DATE: 12/25/21 Therapeutic Exercise: LAQ x10, cues for reduced velocity and compensations Heel slides, seated x10, cues for appropriate ROM   PATIENT EDUCATION:  Education details: Pt education on PT impairments, prognosis, and POC. Informed consent. Rationale for interventions, safe/appropriate HEP performance Person educated: Patient Education method: Explanation, Demonstration, Tactile cues, Verbal cues, and Handouts Education comprehension: verbalized understanding, returned demonstration, verbal cues required, tactile cues required, and needs further education   HOME EXERCISE PROGRAM: Access Code: [email or web address] URL: [email or web address] Date: 12/25/2021 Prepared by: Fransisco Hertz  Exercises - Seated Long Arc Quad  - 1 x daily - 7 x weekly - 3 sets - 10 reps - Seated Knee Flexion AAROM  - 1 x daily - 7 x weekly - 3 sets - 10 reps  ASSESSMENT:  CLINICAL IMPRESSION: Pt is a 66 year old woman who arrives to PT evaluation on this date for knee pain s/p TKA. Post operative course has been complicated by LE DVT and issues tolerating anticoagulation; cleared with referring provider via secure chat, confirmed with pt she has remained on oral anticoagulation since DVT finding and as such is appropriate for mobility per LE DVT algorithm. However, given acuity of DVT did limit intensity of session and monitored vitals throughout which were stable throughout (HR 70s-80s and SpO2 97-98% RA). Pt reports difficulty with ADLs and mobility due to pain as expected post op. During today's session pt demonstrates limitations in operative limb mobility and mild swelling which are limiting ability to perform aforementioned activities and contributing to gait kinematics as above. Pt tolerates  session well with no increases in pain and no adverse events. Recommend skilled PT to address aforementioned deficits to improve functional independence/tolerance.   OBJECTIVE IMPAIRMENTS: Abnormal gait, decreased activity tolerance, decreased balance, decreased endurance, decreased knowledge of use of DME, decreased mobility, difficulty walking, decreased ROM, decreased strength, hypomobility, increased edema, impaired flexibility, and pain.   ACTIVITY LIMITATIONS: carrying, lifting, bending, sitting, standing, squatting, stairs, transfers, bathing, toileting, and locomotion level  PARTICIPATION LIMITATIONS: meal prep, cleaning, laundry, driving, shopping, and community activity  PERSONAL FACTORS: Age and 1-2 comorbidities: HTN, DVT  are also affecting patient's functional outcome.   REHAB POTENTIAL: Good  CLINICAL DECISION MAKING: Evolving/moderate complexity  EVALUATION COMPLEXITY: Moderate   GOALS: Goals reviewed with patient? No  SHORT TERM GOALS: Target date: 01/22/2022   Pt will demonstrate appropriate understanding and performance of initially prescribed HEP in order to facilitate improved independence with management of symptoms.  Baseline: HEP provided on eval Goal status: INITIAL   2. Pt will score greater than or equal to 40 on FOTO in order to demonstrate improved perception of function due to symptoms.  Baseline: 33  Goal status: INITIAL   LONG TERM GOALS: Target date: 02/19/2022   Pt will score 55 or greater on FOTO in order to demonstrate improved perception of function due to symptoms.  Baseline: 33 Goal status: INITIAL  2.  Pt will demonstrate at least 120 degrees of R knee flexion AROM in order to facilitate improved tolerance to functional movements such as bending/squatting.  Baseline: see ROM chart above Goal status: INITIAL  3.  Pt will be able to lift up to 10# with less than 2 pt increase in pain on NPS in order to demonstrate improved capacity for  daily activities such as laundry/dishes.  Baseline: NT given difficulty with transfer as described above Goal status: INITIAL  4.  Pt will report/demonstrate ability to navigate up to 15 stairs safely in order to promote improved  safety w/ home navigation.   Baseline: difficulty/pain with navigating stairs, requires rail  Goal status: INITIAL   PLAN:  PT FREQUENCY: 2x/week  PT DURATION: 8 weeks  PLANNED INTERVENTIONS: Therapeutic exercises, Therapeutic activity, Neuromuscular re-education, Balance training, Gait training, Patient/Family education, Self Care, Joint mobilization, Stair training, DME instructions, Aquatic Therapy, Dry Needling, Electrical stimulation, Cryotherapy, Moist heat, Taping, Manual therapy, and Re-evaluation  PLAN FOR NEXT SESSION: Progress ROM/strengthening exercises as able/appropriate, review HEP.    Leeroy Cha PT, DPT 12/25/2021 2:38 PM

## 2021-12-25 NOTE — Telephone Encounter (Signed)
IC talked with patient. She stated that her rash started again last night even without Tylenol.  She stated she assumes that she needs to d/c the Xarelto at this time.  She is needing new rx for blood thinner.  She is requesting this to be sent to [facility]

## 2021-12-25 NOTE — Telephone Encounter (Signed)
Sent in Eliquis starter pack. Can you let her know she can start Eliquis tomorrow morning for the first dose and Discontinue xarelto entirely.  Let us know if she has similar reaction to this medication.  Thanks!

## 2021-12-26 ENCOUNTER — Encounter: Payer: Self-pay | Admitting: Physician Assistant

## 2021-12-26 ENCOUNTER — Ambulatory Visit: Payer: Medicare Other | Admitting: Physician Assistant

## 2021-12-26 DIAGNOSIS — I82491 Acute embolism and thrombosis of other specified deep vein of right lower extremity: Secondary | ICD-10-CM | POA: Diagnosis not present

## 2021-12-26 DIAGNOSIS — Z96651 Presence of right artificial knee joint: Secondary | ICD-10-CM

## 2021-12-26 DIAGNOSIS — I82409 Acute embolism and thrombosis of unspecified deep veins of unspecified lower extremity: Secondary | ICD-10-CM

## 2021-12-26 HISTORY — DX: Acute embolism and thrombosis of unspecified deep veins of unspecified lower extremity: I82.409

## 2021-12-26 NOTE — Progress Notes (Signed)
Established Patient Office Visit  Subjective   Patient ID: Cheryl Prince, female    DOB: [birth year]  Age: 66 y.o. MRN: [id number]  Chief Complaint  Patient presents with   Home Health Services Needed    Virtual Visit via Video / Telephone Note  I connected with [name] on 12/26/21 at 10:00 AM EDT by telephone and verified that I am speaking with the correct person using two identifiers.  Patient was unable to connect to video, proceeded with telephone visit.  Location: Patient: Home Provider: [facility]    I discussed the limitations, risks, security and privacy concerns of performing an evaluation and management service by telephone and the availability of in person appointments. I also discussed with the patient that there may be a patient responsible charge related to this service. The patient expressed understanding and agreed to proceed.   History of Present Illness: States that she made a mistake last week and thought the people who called her to set up services was home health aide, however it was physical therapy.  States that she still desires to have assistance in her home.  States that she is having to use a walker, is unable to safely leave her home without assistance.  States that she is unable to get in and out of of the bath without assistance, is unable to complete many of her daily living activities.  States that she developed a rash with Xarelto, has been in communication with orthopedic provider who discontinued the Xarelto and she will start Eliquis today.  Denies pain or shortness of breath.  States her blood pressure this morning was 126/97.     Observations/Objective: Medical history and current medications reviewed, no physical exam completed    Past Medical History:  Diagnosis Date   Allergy    Anemia    Anxiety    Arthritis    Asthma    borderline bronchitis   Bronchitis    GERD (gastroesophageal  reflux disease)    Goiter, toxic diffuse 05/30/2016   Hypertension    S/P tonsillectomy 05/30/2016   Sleep apnea    history of, MD tookpt. off machine 7 yrs, ago   Social History   Socioeconomic History   Marital status: Single    Spouse name: Not on file   Number of children: Not on file   Years of education: Not on file   Highest education level: Not on file  Occupational History   Not on file  Tobacco Use   Smoking status: Former    Packs/day: 1.50    Years: 18.00    Total pack years: 27.00    Types: Cigarettes    Quit date: 09/16/1984    Years since quitting: 37.3   Smokeless tobacco: Never   Tobacco comments:    quit 1986  Vaping Use   Vaping Use: Never used  Substance and Sexual Activity   Alcohol use: Yes    Comment: social   Drug use: No   Sexual activity: Not on file  Other Topics Concern   Not on file  Social History Narrative   Not on file   Social Determinants of Health   Financial Resource Strain: Not on file  Food Insecurity: Unknown (11/30/2021)   Hunger Vital Sign    Worried About Running Out of Food in the Last Year: Patient refused    Ran Out of Food in the Last Year: Patient refused  Transportation Needs: Unknown (11/30/2021)  PRAPARE - Administrator, Civil Service (Medical): Patient refused    Lack of Transportation (Non-Medical): Patient refused  Physical Activity: Not on file  Stress: Not on file  Social Connections: Not on file  Intimate Partner Violence: Unknown (11/30/2021)   Humiliation, Afraid, Rape, and Kick questionnaire    Fear of Current or Ex-Partner: Patient refused    Emotionally Abused: Patient refused    Physically Abused: Patient refused    Sexually Abused: Patient refused   Family History  Problem Relation Age of Onset   Hypertension Father    Diabetes Father    Lupus Daughter    Colon cancer Maternal Aunt    Colon cancer Maternal Uncle        4 mat uncles dx colon ca   Esophageal cancer Neg Hx    Rectal  cancer Neg Hx    Stomach cancer Neg Hx    Allergies  Allergen Reactions   Morphine And Related Diarrhea, Nausea And Vomiting and Swelling   Penicillins Swelling and Other (See Comments)    Comatose for 2 days    Pine Shortness Of Breath   Shellfish Allergy Shortness Of Breath and Swelling   Lactose Intolerance (Gi) Other (See Comments)    GI upset   Miralax [Polyethylene Glycol] Itching   Nickel Other (See Comments)    Pt states she gets abscess from the earrings with nickel    Other Itching and Nausea And Vomiting    Mushrooms - feels high, and dizzy    Grapeseed Extract [Nutritional Supplements] Rash    grapes   Latex Itching and Rash    "IF" condom, it makes it painful   Red Dye Swelling and Rash    The dye for checking thyroid.  Red Cast Dye   Sulfites Hives and Rash    Tingling in mouth   Wheat Bran Rash and Other (See Comments)     tingling in mouth, vomiting    Review of Systems  Constitutional: Negative.   HENT: Negative.    Eyes: Negative.   Respiratory:  Negative for shortness of breath.   Cardiovascular:  Negative for chest pain and leg swelling.  Gastrointestinal: Negative.   Genitourinary: Negative.   Musculoskeletal:  Positive for joint pain and myalgias.  Skin: Negative.   Neurological: Negative.   Endo/Heme/Allergies: Negative.   Psychiatric/Behavioral: Negative.         Assessment & Plan:   Problem List Items Addressed This Visit   None Visit Diagnoses     S/P total knee arthroplasty, right    -  Primary   Relevant Orders   Ambulatory referral to Home Health   Acute deep vein thrombosis (DVT) of other specified vein of right lower extremity (HCC)           Assessment and Plan: 1. S/P total knee arthroplasty, right Patient encouraged to continue follow-up with physical therapy, orthopedics - Ambulatory referral to Home Health  2. Acute deep vein thrombosis (DVT) of other specified vein of right lower extremity (HCC) Patient is  starting Eliquis today after reaction to Xarelto.  Patient education given on red flags for prompt reevaluation   Follow Up Instructions:    I discussed the assessment and treatment plan with the patient. The patient was provided an opportunity to ask questions and all were answered. The patient agreed with the plan and demonstrated an understanding of the instructions.   The patient was advised to call back or seek an in-person evaluation if  the symptoms worsen or if the condition fails to improve as anticipated.  I provided 12 minutes of non-face-to-face time during this encounter.   Return if symptoms worsen or fail to improve.    Loraine Grip Mayers, PA-C

## 2021-12-26 NOTE — Telephone Encounter (Signed)
Noted  

## 2021-12-26 NOTE — Patient Instructions (Signed)
I have started a referral on your behalf for home health services.  Please let us know if you need any other assistance at this time.  Roney Jaffe, PA-C Physician Assistant [facility] [email or web address]   Deep Vein Thrombosis  Deep vein thrombosis (DVT) is a condition in which a blood clot forms in a vein of the deep venous system. This can occur in the lower leg, thigh, pelvis, arm, or neck. A clot is blood that has thickened into a gel or solid. This condition is serious and can be life-threatening if the clot travels to the arteries of the lungs and causes a blockage (pulmonary embolism). A DVT can also damage veins in the leg, which can lead to long-term venous disease, leg pain, swelling, discoloration, and ulcers or sores (post-thrombotic syndrome). What are the causes? This condition may be caused by: A slowdown of blood flow. Damage to a vein. A condition that causes blood to clot more easily, such as certain bleeding disorders. What increases the risk? The following factors may make you more likely to develop this condition: Obesity. Being older, especially older than age 1. Being inactive or not moving around (sedentary lifestyle). This may include: Sitting or lying down for longer than 4-6 hours other than to sleep at night. Being in the hospital, or having major or lengthy surgery. Having any recent bone injuries, such as breaks (fractures), that reduce movement, especially in the lower extremities. Having recent orthopedic surgery on the lower extremities. Being pregnant, giving birth, or having recently given birth. Taking medicines that contain estrogen, such as birth control or hormone replacement therapy. Using products that contain nicotine or tobacco, especially if you use hormonal birth control. Having a history of a blood vessel disease (peripheral vascular disease) or congestive heart disease. Having a  history of cancer, especially if being treated with chemotherapy. What are the signs or symptoms? Symptoms of this condition include: Swelling, pain, pressure, or tenderness in an arm or a leg. An arm or a leg becoming warm, red, or discolored. A leg turning very pale or blue. You may have a large DVT. This is rare. If the clot is in your leg, you may notice that symptoms get worse when you stand or walk. In some cases, there are no symptoms. How is this diagnosed? This condition is diagnosed with: Your medical history and a physical exam. Tests, such as: Blood tests to check how well your blood clots. Doppler ultrasound. This is the best way to find a DVT. CT venogram. Contrast dye is injected into a vein, and X-rays are taken to check for clots. This is helpful for veins in the chest or pelvis. How is this treated? Treatment for this condition depends on: The cause of your DVT. The size and location of your DVT, or having more than one DVT. Your risk for bleeding or developing more clots. Other medical conditions you may have. Treatment may include: Taking a blood thinner medicine (anticoagulant) to prevent more clots from forming or current clots from growing. Wearing compression stockings. Injecting medicines into the affected vein to break up the clot (catheter-directed thrombolysis). Surgical procedures, when DVT is severe or hard to treat. These may be done to: Isolate and remove your clot. Place an inferior vena cava (IVC) filter. This filter is placed into a large vein called the inferior vena cava to catch blood clots before they reach your lungs. You may get some medical treatments for 6 months or longer. Follow these  instructions at home: If you are taking blood thinners: Talk with your health care provider before you take any medicines that contain aspirin or NSAIDs, such as ibuprofen. These medicines increase your risk for dangerous bleeding. Take your medicine exactly  as told, at the same time every day. Do not skip a dose. Do not take more than the prescribed dose. This is important. Ask your health care provider about foods and medicines that could change or interact with the way your blood thinner works. Avoid these foods and medicines if you are told to do so. Avoid anything that may cause bleeding or bruising. You may bleed more easily while taking blood thinners. Be very careful when using knives, scissors, or other sharp objects. Use an electric razor instead of a blade. Avoid activities that could cause injury or bruising, and follow instructions for preventing falls. Tell your health care provider if you have had any internal bleeding, bleeding ulcers, or neurologic diseases, such as strokes or cerebral aneurysms. Wear a medical alert bracelet or carry a card that lists what medicines you take. General instructions Take over-the-counter and prescription medicines only as told by your health care provider. Return to your normal activities as told by your health care provider. Ask your health care provider what activities are safe for you. If recommended, wear compression stockings as told by your health care provider. These stockings help to prevent blood clots and reduce swelling in your legs. Never wear your compression stockings while sleeping at night. Keep all follow-up visits. This is important. Where to find more information American Heart Association: www.heart.org Centers for Disease Control and Prevention: http://www.wolf.info/ National Heart, Lung, and Blood Institute: https://wilson-eaton.com/ Contact a health care provider if: You miss a dose of your blood thinner. You have unusual bruising or other color changes. You have new or worse pain, swelling, or redness in an arm or a leg. You have worsening numbness or tingling in an arm or a leg. You have a significant color change (pale or blue) in the extremity that has the DVT. Get help right away if: You  have signs or symptoms that a blood clot has moved to the lungs. These may include: Shortness of breath. Chest pain. Fast or irregular heartbeats (palpitations). Light-headedness, dizziness, or fainting. Coughing up blood. You have signs or symptoms that your blood is too thin. These may include: Blood in your vomit, stool, or urine. A cut that will not stop bleeding. A menstrual period that is heavier than usual. A severe headache or confusion. These symptoms may be an emergency. Get help right away. Call 911. Do not wait to see if the symptoms will go away. Do not drive yourself to the hospital. Summary Deep vein thrombosis (DVT) happens when a blood clot forms in a deep vein. This may occur in the lower leg, thigh, pelvis, arm, or neck. Symptoms affect the arm or leg and can include swelling, pain, tenderness, warmth, redness, or discoloration. This condition may be treated with medicines. In severe cases, a procedure or surgery may be done to remove or dissolve the clots. If you are taking blood thinners, take them exactly as told. Do not skip a dose. Do not take more than is prescribed. Get help right away if you have a severe headache, shortness of breath, chest pain, fast or irregular heartbeats, or blood in your vomit, urine, or stool. This information is not intended to replace advice given to you by your health care provider. Make sure you discuss  any questions you have with your health care provider. Document Revised: 09/04/2020 Document Reviewed: 09/04/2020 Elsevier Patient Education  2023 [employer].

## 2021-12-26 NOTE — Telephone Encounter (Signed)
Lurena Joiner called and talked with patient.

## 2021-12-28 ENCOUNTER — Telehealth: Payer: Self-pay

## 2021-12-28 NOTE — Telephone Encounter (Signed)
PT returning phone call

## 2021-12-28 NOTE — Telephone Encounter (Signed)
The patient has returned a missed phone call from J Brazeau   Please contact further when possible

## 2021-12-28 NOTE — Telephone Encounter (Signed)
Referral received for home health aide services.  I called patient [phone] to discuss this referral. Message left with call back requested.  She has been referred to outpatient PT and would not be eligible for home health aide services alone.  She would need to qualify and receive home health nursing or PT also at home to qualify for an aide  She  can't receive outpatient PT and in home PT at the same time.  A referral can be made for home PT if she chooses and the provider is in agreement' however there is no guarantee that an agency will accept the referral.  The agency needs to be in network with her insurance and have staffing available.  If they have a PT they might not have an aide available. The home health services are short term.  We have experienced problems with agencies accepting home health referrals.   She does not have Medicaid that I am aware of that would qualify her for the aide services alone.

## 2021-12-31 ENCOUNTER — Telehealth: Payer: Self-pay

## 2021-12-31 MED ORDER — TRIAMCINOLONE ACETONIDE 0.1 % EX CREA: 1.0000 | TOPICAL_CREAM | Freq: Two times a day (BID) | CUTANEOUS | 0 refills | Status: DC

## 2021-12-31 NOTE — Telephone Encounter (Signed)
I called patient to inform her that the prescription for the cream was sent to [facility] pharmacy and that she should NOT place it on her surgical wound from knee replacement. Message left with call back requested.

## 2021-12-31 NOTE — Telephone Encounter (Signed)
I spoke to the patient this morning.  She explained that she had an allergic reaction to Xarelto and is now on Eliquis. She developed rash with welts on her ankles and arms from the Xarelto.   She said that she has experienced this this type of allergic reaction multiple times in the past and it was treated with triamcinolone cream 0.1%.  She is requesting a prescription for this be sent to [facility].     Regarding the order for home health aide. She said she does not need assistance with personal care, she would like assistance with housekeeping. I explained that is not the role of the home health aide and her insurance will not pay for housekeeping.  I also explained that her insurance will not cover home PT and out patient PT and she is choosing to continue with outpatient PT.  She stated that she never received therapy while she was at [facility] after her surgery and she checked herself out of the facility.

## 2021-12-31 NOTE — Telephone Encounter (Signed)
I sent [facility] she should NOT place it on her surgical wound from knee replacement

## 2021-12-31 NOTE — Telephone Encounter (Signed)
Opened in error

## 2021-12-31 NOTE — Telephone Encounter (Signed)
Patient called back and I informed her that the prescription for the cream was sent to [facility] and the instructions include NOT to put it on her surgical wound from her knee replacement. She said she understands

## 2022-01-02 ENCOUNTER — Ambulatory Visit: Payer: Medicare Other | Attending: Surgical

## 2022-01-02 DIAGNOSIS — M25661 Stiffness of right knee, not elsewhere classified: Secondary | ICD-10-CM | POA: Diagnosis present

## 2022-01-02 DIAGNOSIS — M6281 Muscle weakness (generalized): Secondary | ICD-10-CM | POA: Diagnosis present

## 2022-01-02 DIAGNOSIS — M25551 Pain in right hip: Secondary | ICD-10-CM | POA: Insufficient documentation

## 2022-01-02 DIAGNOSIS — M25561 Pain in right knee: Secondary | ICD-10-CM | POA: Insufficient documentation

## 2022-01-02 DIAGNOSIS — R2689 Other abnormalities of gait and mobility: Secondary | ICD-10-CM | POA: Insufficient documentation

## 2022-01-02 NOTE — Therapy (Signed)
OUTPATIENT PHYSICAL THERAPY TREATMENT NOTE   Patient Name: Cheryl Prince MRN: [id number] DOB:10-11-1955, 66 y.o., female Today's Date: 01/02/2022  PCP: Elsie Stain, MD  REFERRING PROVIDER: Donella Stade, PA-C   END OF SESSION:   PT End of Session - 01/02/22 1116     Visit Number 2    Number of Visits 17    Date for PT Re-Evaluation 02/19/22    Authorization Type [employer] Medicare    Progress Note Due on Visit 10    PT Start Time 1120    PT Stop Time 1200    PT Time Calculation (min) 40 min    Activity Tolerance Patient tolerated treatment well;No increased pain    Behavior During Therapy WFL for tasks assessed/performed             Past Medical History:  Diagnosis Date   Allergy    Anemia    Anxiety    Arthritis    Asthma    borderline bronchitis   Bronchitis    GERD (gastroesophageal reflux disease)    Goiter, toxic diffuse 05/30/2016   Hypertension    S/P tonsillectomy 05/30/2016   Sleep apnea    history of, MD tookpt. off machine 7 yrs, ago   Past Surgical History:  Procedure Laterality Date   ABDOMINAL HYSTERECTOMY     adnoides     BREAST SURGERY     CHOLECYSTECTOMY     COLONOSCOPY     DILATION AND CURETTAGE OF UTERUS     GASTRIC BYPASS  1998   IUD REMOVAL     TONSILLECTOMY     TOTAL KNEE ARTHROPLASTY Left 09/22/2018   TOTAL KNEE ARTHROPLASTY Left 09/22/2018   Procedure: LEFT TOTAL KNEE ARTHROPLASTY;  Surgeon: Meredith Pel, MD;  Location: [facility];  Service: Orthopedics;  Laterality: Left;   TOTAL KNEE ARTHROPLASTY Right 11/29/2021   Procedure: RIGHT TOTAL KNEE ARTHROPLASTY;  Surgeon: Meredith Pel, MD;  Location: [facility];  Service: Orthopedics;  Laterality: Right;   Patient Active Problem List   Diagnosis Date Noted   S/P total knee arthroplasty, right 12/26/2021   Deep venous thrombosis ([place]) 12/26/2021   Arthritis of knee 11/29/2021   Bipolar I disorder ([place]) 07/26/2020   Right hip pain 07/26/2020   Bronchitis 07/26/2020    Allergies 07/26/2020   GERD (gastroesophageal reflux disease) 07/26/2020   At risk for decreased bone density 07/26/2020   PTSD (post-traumatic stress disorder) 05/30/2016   Alpha thalassemia trait 05/30/2016   Hypertension 05/30/2016   Rotator cuff tendonitis, left 05/30/2016   Osteoarthritis of left knee 05/30/2016   Carpal tunnel syndrome 05/30/2016   Prediabetes 05/30/2016   Onychomycosis 05/30/2016    REFERRING DIAG: Z96.651 (ICD-10-CM) - Hx of total knee arthroplasty, right   THERAPY DIAG:  Right knee pain, unspecified chronicity  Stiffness of right knee, not elsewhere classified  Muscle weakness (generalized)  Rationale for Evaluation and Treatment Rehabilitation  PERTINENT HISTORY: HTN, recent DVT on anticoagulant, GERD, TKA 11/29/21 on RLE, prior L TKA   PRECAUTIONS: Knee and Fall, recent DVT on anticoagulation   ONSET DATE: Oct 5th 2023   SUBJECTIVE:  SUBJECTIVE STATEMENT:  Patient reports she took pain medication before she came to PT today and she has been doing her exercises daily.    PAIN:  Are you having pain: 6/10 Location: R TKA How would you describe your pain? Throbbing, sharp, pressure Best in past week: 0/10 Worst in past week: 8/10 Aggravating factors: stair navigation, walking more than household distances,  Easing factors: medication, rest, ice    OBJECTIVE: (objective measures completed at initial evaluation unless otherwise dated)   PATIENT SURVEYS:  FOTO 33   COGNITION: Overall cognitive status: Within functional limits for tasks assessed                         SENSATION: Light touch intact B LE although pt reports subjective numbness along lateral portion of incision   EDEMA/INSPECTION:  Circumferential: 38cm L joint line, 39.6 cm R joint line  12cm  distal to joint line 38cm RLE, 38.5cm LLE Incision still has steri strips on distal half, although visible portion appears to be healing well     PALPATION: Palpable tightness quad/TFL and hamstrings on operative limb   LOWER EXTREMITY ROM:   Active ROM Right eval Left eval  Knee flexion 106 118  Knee extension -2 0  Ankle dorsiflexion      Ankle plantarflexion      Ankle inversion      Ankle eversion       (Blank rows = not tested)   LOWER EXTREMITY MMT:   MMT Right eval Left eval  Hip flexion      Hip extension      Hip abduction      Hip adduction      Hip internal rotation      Hip external rotation      Knee flexion   WFL  Knee extension   WFL  Ankle dorsiflexion      Ankle plantarflexion      Ankle inversion      Ankle eversion       (Blank rows = not tested) Comments: operative limb not tested given recency of DVT      FUNCTIONAL TESTS:  STS: standard chair, heavy UE support, weight shift to L, stooped posture prior to initiating upright on rollator   Will plan to test TUG vs 5xSTS next session 01/02/2022: TUG: 28 seconds 5xSTS: 28 seconds   GAIT: Distance walked: within clinic Assistive device utilized: rollator Level of assistance: Modified independence Comments: reduced stance on R, reduced knee ROM on R, reduced hip extension B, increased lateral weight shifting     TODAY'S TREATMENT:        [facility] Adult PT Treatment:                                                DATE: 01/02/2022 Therapeutic Exercise: Nustep for ROM level 5 x 5 mins Step ups 2" Rt leading x10 with UE support LAQ 3x10 SAQ 3x10 SLR (unable to lift) Clamshell GTB 3x10 Therapeutic Activity: TUG 28 seconds 5xSTS 28 seconds Modalities: 2 coldpacks applied to posterior/anterior Rt knee, pt in supine x10 mins post session  [facility] Adult PT Treatment:                                                 DATE: 12/25/21 Therapeutic Exercise: LAQ x10, cues for reduced velocity and compensations Heel slides, seated x10, cues for appropriate ROM     PATIENT EDUCATION:  Education details: Pt education on PT impairments, prognosis, and POC. Informed consent. Rationale for interventions, safe/appropriate HEP performance Person educated: Patient Education method: Explanation, Demonstration, Tactile cues, Verbal cues, and Handouts Education comprehension: verbalized understanding, returned demonstration, verbal cues required, tactile cues required, and needs further education    HOME EXERCISE PROGRAM: Access Code: [email or web address] URL: [email or web address] Date: 12/25/2021 Prepared by: Enis Slipper   Exercises - Seated Long Arc Quad  - 1 x daily - 7 x weekly - 3 sets - 10 reps - Seated Knee Flexion AAROM  - 1 x daily - 7 x weekly - 3 sets - 10 reps   ASSESSMENT:   CLINICAL IMPRESSION: Patient presents to PT reporting moderate pain in her Rt knee and reports that she has been doing her exercises each day. Performed TUG and 5xSTS today with patient demonstrating decreased community ambulation. Session today focused on quad strengthening and Rt knee ROM. Attempted SLR but patient was unable to lift secondary to quadriceps weakness. Updated HEP with patient demonstrating understanding of exercises. Patient was able to tolerate all prescribed exercises with no adverse effects. Patient continues to benefit from skilled PT services and should be progressed as able to improve functional independence.     OBJECTIVE IMPAIRMENTS: Abnormal gait, decreased activity tolerance, decreased balance, decreased endurance, decreased knowledge of use of DME, decreased mobility, difficulty walking, decreased ROM, decreased strength, hypomobility, increased edema, impaired flexibility, and pain.    ACTIVITY LIMITATIONS: carrying, lifting, bending, sitting, standing, squatting,  stairs, transfers, bathing, toileting, and locomotion level   PARTICIPATION LIMITATIONS: meal prep, cleaning, laundry, driving, shopping, and community activity   PERSONAL FACTORS: Age and 1-2 comorbidities: HTN, DVT  are also affecting patient's functional outcome.    REHAB POTENTIAL: Good   CLINICAL DECISION MAKING: Evolving/moderate complexity   EVALUATION COMPLEXITY: Moderate     GOALS: Goals reviewed with patient? No   SHORT TERM GOALS: Target date: 01/22/2022             Pt will demonstrate appropriate understanding and performance of initially prescribed HEP in order to facilitate improved independence with management of symptoms.  Baseline: HEP provided on eval Goal status: INITIAL    2. Pt will score greater than or equal to 40 on FOTO in order to demonstrate improved perception of function due to symptoms.            Baseline: 33            Goal status: INITIAL    LONG TERM GOALS: Target date: 02/19/2022    Pt will score 55 or greater on FOTO in order to demonstrate improved perception of function due to symptoms.  Baseline: 33 Goal status: INITIAL   2.  Pt will demonstrate at least 120 degrees of R knee flexion AROM in order to facilitate improved tolerance to functional movements such as bending/squatting.  Baseline: see ROM chart above Goal status: INITIAL   3.  Pt will be able to lift up to 10# with less than 2 pt increase in pain on NPS in  order to demonstrate improved capacity for daily activities such as laundry/dishes.  Baseline: NT given difficulty with transfer as described above Goal status: INITIAL   4.  Pt will report/demonstrate ability to navigate up to 15 stairs safely in order to promote improved safety w/ home navigation.             Baseline: difficulty/pain with navigating stairs, requires rail            Goal status: INITIAL     PLAN:   PT FREQUENCY: 2x/week   PT DURATION: 8 weeks   PLANNED INTERVENTIONS: Therapeutic exercises,  Therapeutic activity, Neuromuscular re-education, Balance training, Gait training, Patient/Family education, Self Care, Joint mobilization, Stair training, DME instructions, Aquatic Therapy, Dry Needling, Electrical stimulation, Cryotherapy, Moist heat, Taping, Manual therapy, and Re-evaluation   PLAN FOR NEXT SESSION: Progress ROM/strengthening exercises as able/appropriate, review HEP.    Berta Minor, PTA 01/02/2022, 11:17 AM

## 2022-01-03 NOTE — Therapy (Addendum)
OUTPATIENT PHYSICAL THERAPY TREATMENT NOTE   Patient Name: Cheryl Prince MRN: [id number] DOB:1955-07-06, 66 y.o., female Today's Date: 01/04/2022  PCP: Storm Frisk, MD  REFERRING PROVIDER: Julieanne Cotton, PA-C   END OF SESSION:   PT End of Session - 01/04/22 1223     Visit Number 3    Number of Visits 17    Date for PT Re-Evaluation 02/19/22    Authorization Type [employer]    Progress Note Due on Visit 10    PT Start Time 1225    PT Stop Time 1319   including cold packs - time not billed   PT Time Calculation (min) 54 min    Activity Tolerance Patient tolerated treatment well;No increased pain    Behavior During Therapy WFL for tasks assessed/performed              Past Medical History:  Diagnosis Date   Allergy    Anemia    Anxiety    Arthritis    Asthma    borderline bronchitis   Bronchitis    GERD (gastroesophageal reflux disease)    Goiter, toxic diffuse 05/30/2016   Hypertension    S/P tonsillectomy 05/30/2016   Sleep apnea    history of, MD tookpt. off machine 7 yrs, ago   Past Surgical History:  Procedure Laterality Date   ABDOMINAL HYSTERECTOMY     adnoides     BREAST SURGERY     CHOLECYSTECTOMY     COLONOSCOPY     DILATION AND CURETTAGE OF UTERUS     GASTRIC BYPASS  1998   IUD REMOVAL     TONSILLECTOMY     TOTAL KNEE ARTHROPLASTY Left 09/22/2018   TOTAL KNEE ARTHROPLASTY Left 09/22/2018   Procedure: LEFT TOTAL KNEE ARTHROPLASTY;  Surgeon: Cammy Copa, MD;  Location: [facility] OR;  Service: Orthopedics;  Laterality: Left;   TOTAL KNEE ARTHROPLASTY Right 11/29/2021   Procedure: RIGHT TOTAL KNEE ARTHROPLASTY;  Surgeon: Cammy Copa, MD;  Location: [facility] OR;  Service: Orthopedics;  Laterality: Right;   Patient Active Problem List   Diagnosis Date Noted   S/P total knee arthroplasty, right 12/26/2021   Deep venous thrombosis (HCC) 12/26/2021   Arthritis of knee 11/29/2021   Bipolar I disorder (HCC) 07/26/2020   Right hip  pain 07/26/2020   Bronchitis 07/26/2020   Allergies 07/26/2020   GERD (gastroesophageal reflux disease) 07/26/2020   At risk for decreased bone density 07/26/2020   PTSD (post-traumatic stress disorder) 05/30/2016   Alpha thalassemia trait 05/30/2016   Hypertension 05/30/2016   Rotator cuff tendonitis, left 05/30/2016   Osteoarthritis of left knee 05/30/2016   Carpal tunnel syndrome 05/30/2016   Prediabetes 05/30/2016   Onychomycosis 05/30/2016    REFERRING DIAG: Z96.651 (ICD-10-CM) - Hx of total knee arthroplasty, right   THERAPY DIAG:  Right knee pain, unspecified chronicity  Stiffness of right knee, not elsewhere classified  Muscle weakness (generalized)  Other abnormalities of gait and mobility  Rationale for Evaluation and Treatment Rehabilitation  PERTINENT HISTORY: HTN, recent DVT on anticoagulant, GERD, TKA 11/29/21 on RLE, prior L TKA   PRECAUTIONS: Knee and Fall, recent DVT on anticoagulation   ONSET DATE: Oct 5th 2023   SUBJECTIVE:  SUBJECTIVE STATEMENT:    Pt arrives and states she is doing well overall, has been working on exercises. States she has taken medication before session. No other new updates.   PAIN:  Are you having pain: 4/10 Location: R TKA How would you describe your pain? Throbbing, sharp, pressure Best in past week: 0/10 Worst in past week: 8/10 Aggravating factors: stair navigation, walking more than household distances,  Easing factors: medication, rest, ice    OBJECTIVE: (objective measures completed at initial evaluation unless otherwise dated)   PATIENT SURVEYS:  FOTO 33   COGNITION: Overall cognitive status: Within functional limits for tasks assessed                         SENSATION: Light touch intact B LE although pt reports subjective numbness  along lateral portion of incision   EDEMA/INSPECTION:  Circumferential: 38cm L joint line, 39.6 cm R joint line  12cm distal to joint line 38cm RLE, 38.5cm LLE Incision still has steri strips on distal half, although visible portion appears to be healing well     PALPATION: Palpable tightness quad/TFL and hamstrings on operative limb   LOWER EXTREMITY ROM:   Active ROM Right eval Left eval  Knee flexion 106 118  Knee extension -2 0  Ankle dorsiflexion      Ankle plantarflexion      Ankle inversion      Ankle eversion       (Blank rows = not tested)   LOWER EXTREMITY MMT:   MMT Right eval Left eval  Hip flexion      Hip extension      Hip abduction      Hip adduction      Hip internal rotation      Hip external rotation      Knee flexion   WFL  Knee extension   WFL  Ankle dorsiflexion      Ankle plantarflexion      Ankle inversion      Ankle eversion       (Blank rows = not tested) Comments: operative limb not tested given recency of DVT      FUNCTIONAL TESTS:  STS: standard chair, heavy UE support, weight shift to L, stooped posture prior to initiating upright on rollator   Will plan to test TUG vs 5xSTS next session 01/02/2022: TUG: 28 seconds 5xSTS: 28 seconds   GAIT: Distance walked: within clinic Assistive device utilized: rollator Level of assistance: Modified independence Comments: reduced stance on R, reduced knee ROM on R, reduced hip extension B, increased lateral weight shifting     TODAY'S TREATMENT:        [facility] Adult PT Treatment:                                                DATE: 01/04/22 Therapeutic Exercise: Nu step L5 during subjective  LAQ 3x8, cues for positioning STS slightly raised mat, 3x8 SLR limited ROM, 3x5, cues for control  Heel slides w/ strap AAROM 2x12, cues for form Sidelying hip abd RLE, x8 cues for hip positioning  Clamshell no band, 2x10 RLE only 2inch step up w/ gentle UE support, x8, cues for appropriate  control and mechanics  Modalities: 2 coldpacks anterior/posterior R knee, supine w/ bolster under knee, post session, no adverse  events  [facility] Adult PT Treatment:                                                DATE: 01/02/2022 Therapeutic Exercise: Nustep for ROM level 5 x 5 mins Step ups 2" Rt leading x10 with UE support LAQ 3x10 SAQ 3x10 SLR (unable to lift) Clamshell GTB 3x10 Therapeutic Activity: TUG 28 seconds 5xSTS 28 seconds Modalities: 2 coldpacks applied to posterior/anterior Rt knee, pt in supine x10 mins post session                                                                                                                         [facility] Adult PT Treatment:                                                DATE: 12/25/21 Therapeutic Exercise: LAQ x10, cues for reduced velocity and compensations Heel slides, seated x10, cues for appropriate ROM     PATIENT EDUCATION:  Education details: Pt education on PT impairments, prognosis, and POC. Informed consent. Rationale for interventions, safe/appropriate HEP performance Person educated: Patient Education method: Explanation, Demonstration, Tactile cues, Verbal cues, and Handouts Education comprehension: verbalized understanding, returned demonstration, verbal cues required, tactile cues required, and needs further education    HOME EXERCISE PROGRAM: Access Code: [email or web address] URL: [email or web address] Date: 12/25/2021 Prepared by: Fransisco Hertz   Exercises - Seated Long Arc Quad  - 1 x daily - 7 x weekly - 3 sets - 10 reps - Seated Knee Flexion AAROM  - 1 x daily - 7 x weekly - 3 sets - 10 reps   ASSESSMENT:   CLINICAL IMPRESSION: Pt arrives w/ 4/10 pain, denies significant soreness after last session. Pt continues to progress well with exercises emphasizing open and closed chain LE strength. Able to perform SLR on this date - mild reduction in TKE but no increased lag with repetition. Attempted  progression to sidelying hip abd but given limited tolerance regressed back to clamshells, which improves symptoms. Pt tolerates session well without lasting increase in symptoms, no adverse events. Incision healing well. Requests cold packs at end of session, good relief reported and no issues. Pt departs today's session in no acute distress, all voiced questions/concerns addressed appropriately from PT perspective.      OBJECTIVE IMPAIRMENTS: Abnormal gait, decreased activity tolerance, decreased balance, decreased endurance, decreased knowledge of use of DME, decreased mobility, difficulty walking, decreased ROM, decreased strength, hypomobility, increased edema, impaired flexibility, and pain.    ACTIVITY LIMITATIONS: carrying, lifting, bending, sitting, standing, squatting, stairs, transfers, bathing, toileting, and locomotion level   PARTICIPATION LIMITATIONS: meal prep, cleaning, laundry, driving, shopping, and community activity   PERSONAL FACTORS: Age and 1-2  comorbidities: HTN, DVT  are also affecting patient's functional outcome.    REHAB POTENTIAL: Good   CLINICAL DECISION MAKING: Evolving/moderate complexity   EVALUATION COMPLEXITY: Moderate     GOALS: Goals reviewed with patient? No   SHORT TERM GOALS: Target date: 01/22/2022             Pt will demonstrate appropriate understanding and performance of initially prescribed HEP in order to facilitate improved independence with management of symptoms.  Baseline: HEP provided on eval Goal status: INITIAL    2. Pt will score greater than or equal to 40 on FOTO in order to demonstrate improved perception of function due to symptoms.            Baseline: 33            Goal status: INITIAL    LONG TERM GOALS: Target date: 02/19/2022    Pt will score 55 or greater on FOTO in order to demonstrate improved perception of function due to symptoms.  Baseline: 33 Goal status: INITIAL   2.  Pt will demonstrate at least 120 degrees  of R knee flexion AROM in order to facilitate improved tolerance to functional movements such as bending/squatting.  Baseline: see ROM chart above Goal status: INITIAL   3.  Pt will be able to lift up to 10# with less than 2 pt increase in pain on NPS in order to demonstrate improved capacity for daily activities such as laundry/dishes.  Baseline: NT given difficulty with transfer as described above Goal status: INITIAL   4.  Pt will report/demonstrate ability to navigate up to 15 stairs safely in order to promote improved safety w/ home navigation.             Baseline: difficulty/pain with navigating stairs, requires rail            Goal status: INITIAL     PLAN:   PT FREQUENCY: 2x/week   PT DURATION: 8 weeks   PLANNED INTERVENTIONS: Therapeutic exercises, Therapeutic activity, Neuromuscular re-education, Balance training, Gait training, Patient/Family education, Self Care, Joint mobilization, Stair training, DME instructions, Aquatic Therapy, Dry Needling, Electrical stimulation, Cryotherapy, Moist heat, Taping, Manual therapy, and Re-evaluation   PLAN FOR NEXT SESSION:  Progress ROM/strengthening exercises as able/appropriate, review HEP. manual/modalities as indicated/appropriate   Ashley Murrain, PT 01/04/2022, 1:21 PM

## 2022-01-04 ENCOUNTER — Encounter: Payer: Self-pay | Admitting: Physical Therapy

## 2022-01-04 ENCOUNTER — Ambulatory Visit: Payer: Medicare Other | Admitting: Physical Therapy

## 2022-01-04 DIAGNOSIS — R2689 Other abnormalities of gait and mobility: Secondary | ICD-10-CM

## 2022-01-04 DIAGNOSIS — M25561 Pain in right knee: Secondary | ICD-10-CM

## 2022-01-04 DIAGNOSIS — M25661 Stiffness of right knee, not elsewhere classified: Secondary | ICD-10-CM

## 2022-01-04 DIAGNOSIS — M6281 Muscle weakness (generalized): Secondary | ICD-10-CM

## 2022-01-08 ENCOUNTER — Encounter: Payer: Self-pay | Admitting: Physical Therapy

## 2022-01-08 ENCOUNTER — Ambulatory Visit: Payer: Medicare Other | Admitting: Physical Therapy

## 2022-01-08 DIAGNOSIS — M25561 Pain in right knee: Secondary | ICD-10-CM

## 2022-01-08 DIAGNOSIS — M25661 Stiffness of right knee, not elsewhere classified: Secondary | ICD-10-CM

## 2022-01-08 DIAGNOSIS — M6281 Muscle weakness (generalized): Secondary | ICD-10-CM

## 2022-01-08 DIAGNOSIS — R2689 Other abnormalities of gait and mobility: Secondary | ICD-10-CM

## 2022-01-08 NOTE — Therapy (Signed)
OUTPATIENT PHYSICAL THERAPY TREATMENT NOTE   Patient Name: Cheryl Prince MRN: [id number] DOB:1956/02/22, 66 y.o., female Today's Date: 01/08/2022  PCP: Storm FriskWright, Patrick E, MD  REFERRING PROVIDER: Julieanne CottonMagnant, Charles L, PA-C   END OF SESSION:   PT End of Session - 01/08/22 1101     Visit Number 4    Number of Visits 17    Date for PT Re-Evaluation 02/19/22    Authorization Type [employer]    Progress Note Due on Visit 10    PT Start Time 1101    PT Stop Time 1150   last ten minutes not billed, icing   PT Time Calculation (min) 49 min    Activity Tolerance Patient tolerated treatment well;No increased pain    Behavior During Therapy WFL for tasks assessed/performed               Past Medical History:  Diagnosis Date   Allergy    Anemia    Anxiety    Arthritis    Asthma    borderline bronchitis   Bronchitis    GERD (gastroesophageal reflux disease)    Goiter, toxic diffuse 05/30/2016   Hypertension    S/P tonsillectomy 05/30/2016   Sleep apnea    history of, MD tookpt. off machine 7 yrs, ago   Past Surgical History:  Procedure Laterality Date   ABDOMINAL HYSTERECTOMY     adnoides     BREAST SURGERY     CHOLECYSTECTOMY     COLONOSCOPY     DILATION AND CURETTAGE OF UTERUS     GASTRIC BYPASS  1998   IUD REMOVAL     TONSILLECTOMY     TOTAL KNEE ARTHROPLASTY Left 09/22/2018   TOTAL KNEE ARTHROPLASTY Left 09/22/2018   Procedure: LEFT TOTAL KNEE ARTHROPLASTY;  Surgeon: Cammy Copaean, Gregory Scott, MD;  Location: [facility] OR;  Service: Orthopedics;  Laterality: Left;   TOTAL KNEE ARTHROPLASTY Right 11/29/2021   Procedure: RIGHT TOTAL KNEE ARTHROPLASTY;  Surgeon: Cammy Copaean, Gregory Scott, MD;  [facility];  Service: Orthopedics;  Laterality: Right;   Patient Active Problem List   Diagnosis Date Noted   S/P total knee arthroplasty, right 12/26/2021   Deep venous thrombosis (HCC) 12/26/2021   Arthritis of knee 11/29/2021   Bipolar I disorder (HCC) 07/26/2020   Right hip  pain 07/26/2020   Bronchitis 07/26/2020   Allergies 07/26/2020   GERD (gastroesophageal reflux disease) 07/26/2020   At risk for decreased bone density 07/26/2020   PTSD (post-traumatic stress disorder) 05/30/2016   Alpha thalassemia trait 05/30/2016   Hypertension 05/30/2016   Rotator cuff tendonitis, left 05/30/2016   Osteoarthritis of left knee 05/30/2016   Carpal tunnel syndrome 05/30/2016   Prediabetes 05/30/2016   Onychomycosis 05/30/2016    REFERRING DIAG: Z96.651 (ICD-10-CM) - Hx of total knee arthroplasty, right   THERAPY DIAG:  Right knee pain, unspecified chronicity  Stiffness of right knee, not elsewhere classified  Muscle weakness (generalized)  Other abnormalities of gait and mobility  Rationale for Evaluation and Treatment Rehabilitation  PERTINENT HISTORY: HTN, recent DVT on anticoagulant, GERD, TKA 11/29/21 on RLE, prior L TKA   PRECAUTIONS: Knee and Fall, recent DVT on anticoagulation   ONSET DATE: Oct 5th 2023   SUBJECTIVE:  SUBJECTIVE STATEMENT:    Pt arrives with 5/10 pain on NPS, states her lateral knee has bothered her a bit more since last session. Reports some increase in soreness/pain the evening after last session but back to baseline next day. No other new updates    PAIN:  Are you having pain: 5/10 Location: R TKA How would you describe your pain? Throbbing, sharp, pressure Best in past week: 0/10 Worst in past week: 8/10 Aggravating factors: stair navigation, walking more than household distances,  Easing factors: medication, rest, ice    OBJECTIVE: (objective measures completed at initial evaluation unless otherwise dated)   PATIENT SURVEYS:  FOTO 33   COGNITION: Overall cognitive status: Within functional limits for tasks assessed                          SENSATION: Light touch intact B LE although pt reports subjective numbness along lateral portion of incision   EDEMA/INSPECTION:  Circumferential: 38cm L joint line, 39.6 cm R joint line  12cm distal to joint line 38cm RLE, 38.5cm LLE Incision still has steri strips on distal half, although visible portion appears to be healing well     PALPATION: Palpable tightness quad/TFL and hamstrings on operative limb   LOWER EXTREMITY ROM:   Active ROM Right eval Left eval  Knee flexion 106 118  Knee extension -2 0  Ankle dorsiflexion      Ankle plantarflexion      Ankle inversion      Ankle eversion       (Blank rows = not tested)   LOWER EXTREMITY MMT:   MMT Right eval Left eval  Hip flexion      Hip extension      Hip abduction      Hip adduction      Hip internal rotation      Hip external rotation      Knee flexion   WFL  Knee extension   WFL  Ankle dorsiflexion      Ankle plantarflexion      Ankle inversion      Ankle eversion       (Blank rows = not tested) Comments: operative limb not tested given recency of DVT      FUNCTIONAL TESTS:  STS: standard chair, heavy UE support, weight shift to L, stooped posture prior to initiating upright on rollator   Will plan to test TUG vs 5xSTS next session 01/02/2022: TUG: 28 seconds 5xSTS: 28 seconds   GAIT: Distance walked: within clinic Assistive device utilized: rollator Level of assistance: Modified independence Comments: reduced stance on R, reduced knee ROM on R, reduced hip extension B, increased lateral weight shifting     TODAY'S TREATMENT:        [facility] Adult PT Treatment:                                                DATE: 01/08/22 Therapeutic Exercise: Nu step L5 LE/UE 5 min during subjective Seated LAQ 2x20, cues for pacing  Adductor isos 3x10, cues for pacing and posture STS from lowest mat + bolster, 2x8 2inch step up 2x8 with UE support SLR 3x5 cues for pacing (mild reduction in TKE but no overt  lag) Quad set x15 Supine heel slide with strap x15 RLE only, cues for form and  hold  Modalities: 2 cold packs anterior/posterior R knee, supine w bolster under  knee, post session no adverse events   [facility] Adult PT Treatment:                                                DATE: 01/04/22 Therapeutic Exercise: Nu step L5 during subjective  LAQ 3x8, cues for positioning STS slightly raised mat, 3x8 SLR limited ROM, 3x5, cues for control  Heel slides w/ strap AAROM 2x12, cues for form Sidelying hip abd RLE, x8 cues for hip positioning  Clamshell no band, 2x10 RLE only 2inch step up w/ gentle UE support, x8, cues for appropriate control and mechanics  Modalities: 2 coldpacks anterior/posterior R knee, supine w/ bolster under knee, post session, no adverse events  [facility] Adult PT Treatment:                                                DATE: 01/02/2022 Therapeutic Exercise: Nustep for ROM level 5 x 5 mins Step ups 2" Rt leading x10 with UE support LAQ 3x10 SAQ 3x10 SLR (unable to lift) Clamshell GTB 3x10 Therapeutic Activity: TUG 28 seconds 5xSTS 28 seconds Modalities: 2 coldpacks applied to posterior/anterior Rt knee, pt in supine x10 mins post session                                                                                                                            PATIENT EDUCATION:  Education details: rationale for interventions Person educated: Patient Education method: Explanation, Demonstration, Tactile cues, Verbal cues Education comprehension: verbalized understanding, returned demonstration, verbal cues required, tactile cues required, and needs further education    HOME EXERCISE PROGRAM: Access Code: [email or web address] URL: [email or web address] Date: 12/25/2021 Prepared by: Fransisco Hertz   Exercises - Seated Long Arc Quad  - 1 x daily - 7 x weekly - 3 sets - 10 reps - Seated Knee Flexion AAROM  - 1 x daily - 7 x weekly - 3 sets - 10  reps   ASSESSMENT:   CLINICAL IMPRESSION: Pt arrives with 5/10 pain, report some increase in lateral knee pain since last session which she attributes to attempts at sidelying hip abd, subsequently deferred in favor of closed chain exercise. Continues to progress well overall with emphasis on open/closed chain LE strengthening, quad activation, and knee flexion ROM. Cold packs at end of session with good relief. Encouraged appropriate icing at home. Pt reports good relief at end of session, no adverse events, tolerates well overall and departs with no resting pain. Pt departs today's session in no acute distress, all voiced questions/concerns addressed appropriately from PT perspective.      OBJECTIVE IMPAIRMENTS:  Abnormal gait, decreased activity tolerance, decreased balance, decreased endurance, decreased knowledge of use of DME, decreased mobility, difficulty walking, decreased ROM, decreased strength, hypomobility, increased edema, impaired flexibility, and pain.    ACTIVITY LIMITATIONS: carrying, lifting, bending, sitting, standing, squatting, stairs, transfers, bathing, toileting, and locomotion level   PARTICIPATION LIMITATIONS: meal prep, cleaning, laundry, driving, shopping, and community activity   PERSONAL FACTORS: Age and 1-2 comorbidities: HTN, DVT  are also affecting patient's functional outcome.    REHAB POTENTIAL: Good   CLINICAL DECISION MAKING: Evolving/moderate complexity   EVALUATION COMPLEXITY: Moderate     GOALS: Goals reviewed with patient? No   SHORT TERM GOALS: Target date: 01/22/2022             Pt will demonstrate appropriate understanding and performance of initially prescribed HEP in order to facilitate improved independence with management of symptoms.  Baseline: HEP provided on eval Goal status: INITIAL    2. Pt will score greater than or equal to 40 on FOTO in order to demonstrate improved perception of function due to symptoms.            Baseline:  33            Goal status: INITIAL    LONG TERM GOALS: Target date: 02/19/2022    Pt will score 55 or greater on FOTO in order to demonstrate improved perception of function due to symptoms.  Baseline: 33 Goal status: INITIAL   2.  Pt will demonstrate at least 120 degrees of R knee flexion AROM in order to facilitate improved tolerance to functional movements such as bending/squatting.  Baseline: see ROM chart above Goal status: INITIAL   3.  Pt will be able to lift up to 10# with less than 2 pt increase in pain on NPS in order to demonstrate improved capacity for daily activities such as laundry/dishes.  Baseline: NT given difficulty with transfer as described above Goal status: INITIAL   4.  Pt will report/demonstrate ability to navigate up to 15 stairs safely in order to promote improved safety w/ home navigation.             Baseline: difficulty/pain with navigating stairs, requires rail            Goal status: INITIAL     PLAN:   PT FREQUENCY: 2x/week   PT DURATION: 8 weeks   PLANNED INTERVENTIONS: Therapeutic exercises, Therapeutic activity, Neuromuscular re-education, Balance training, Gait training, Patient/Family education, Self Care, Joint mobilization, Stair training, DME instructions, Aquatic Therapy, Dry Needling, Electrical stimulation, Cryotherapy, Moist heat, Taping, Manual therapy, and Re-evaluation   PLAN FOR NEXT SESSION:   Progress ROM/strengthening exercises as able/appropriate, review HEP. manual/modalities as indicated/appropriate   Ashley Murrain PT, DPT 01/08/2022 11:54 AM

## 2022-01-09 NOTE — Therapy (Signed)
OUTPATIENT PHYSICAL THERAPY TREATMENT NOTE   Patient Name: Cheryl Prince MRN: [id number] DOB:01-03-1956, 66 y.o., female Today's Date: 01/10/2022  PCP: Storm Frisk, MD  REFERRING PROVIDER: Julieanne Cotton, PA-C   END OF SESSION:   PT End of Session - 01/10/22 1100     Visit Number 5    Number of Visits 17    Date for PT Re-Evaluation 02/19/22    Authorization Type [employer]    Progress Note Due on Visit 10    PT Start Time 1100    PT Stop Time 1144   last ten minutes unbilled, ice packs   PT Time Calculation (min) 44 min    Activity Tolerance Patient tolerated treatment well;No increased pain    Behavior During Therapy WFL for tasks assessed/performed                Past Medical History:  Diagnosis Date   Allergy    Anemia    Anxiety    Arthritis    Asthma    borderline bronchitis   Bronchitis    GERD (gastroesophageal reflux disease)    Goiter, toxic diffuse 05/30/2016   Hypertension    S/P tonsillectomy 05/30/2016   Sleep apnea    history of, MD tookpt. off machine 7 yrs, ago   Past Surgical History:  Procedure Laterality Date   ABDOMINAL HYSTERECTOMY     adnoides     BREAST SURGERY     CHOLECYSTECTOMY     COLONOSCOPY     DILATION AND CURETTAGE OF UTERUS     GASTRIC BYPASS  1998   IUD REMOVAL     TONSILLECTOMY     TOTAL KNEE ARTHROPLASTY Left 09/22/2018   TOTAL KNEE ARTHROPLASTY Left 09/22/2018   Procedure: LEFT TOTAL KNEE ARTHROPLASTY;  Surgeon: Cammy Copa, MD;  Location: [facility] OR;  Service: Orthopedics;  Laterality: Left;   TOTAL KNEE ARTHROPLASTY Right 11/29/2021   Procedure: RIGHT TOTAL KNEE ARTHROPLASTY;  Surgeon: Cammy Copa, MD;  Location: [facility] OR;  Service: Orthopedics;  Laterality: Right;   Patient Active Problem List   Diagnosis Date Noted   S/P total knee arthroplasty, right 12/26/2021   Deep venous thrombosis (HCC) 12/26/2021   Arthritis of knee 11/29/2021   Bipolar I disorder (HCC) 07/26/2020   Right  hip pain 07/26/2020   Bronchitis 07/26/2020   Allergies 07/26/2020   GERD (gastroesophageal reflux disease) 07/26/2020   At risk for decreased bone density 07/26/2020   PTSD (post-traumatic stress disorder) 05/30/2016   Alpha thalassemia trait 05/30/2016   Hypertension 05/30/2016   Rotator cuff tendonitis, left 05/30/2016   Osteoarthritis of left knee 05/30/2016   Carpal tunnel syndrome 05/30/2016   Prediabetes 05/30/2016   Onychomycosis 05/30/2016    REFERRING DIAG: Z96.651 (ICD-10-CM) - Hx of total knee arthroplasty, right   THERAPY DIAG:  Right knee pain, unspecified chronicity  Stiffness of right knee, not elsewhere classified  Muscle weakness (generalized)  Other abnormalities of gait and mobility  Rationale for Evaluation and Treatment Rehabilitation  PERTINENT HISTORY: HTN, recent DVT on anticoagulant, GERD, TKA 11/29/21 on RLE, prior L TKA   PRECAUTIONS: Knee and Fall, recent DVT on anticoagulation   ONSET DATE: Oct 5th 2023   SUBJECTIVE:  SUBJECTIVE STATEMENT:     Pt reports improved symptoms on this date, some soreness/stiffness but denies overt pain. Reports good compliance with HEP    PAIN:  Are you having pain: none/10 Location: R TKA How would you describe your pain? Throbbing, sharp, pressure Best in past week: 0/10 Worst in past week: 8/10 Aggravating factors: stair navigation, walking more than household distances,  Easing factors: medication, rest, ice    OBJECTIVE: (objective measures completed at initial evaluation unless otherwise dated)   PATIENT SURVEYS:  FOTO 33   COGNITION: Overall cognitive status: Within functional limits for tasks assessed                         SENSATION: Light touch intact B LE although pt reports subjective numbness along lateral  portion of incision   EDEMA/INSPECTION:  Circumferential: 38cm L joint line, 39.6 cm R joint line  12cm distal to joint line 38cm RLE, 38.5cm LLE Incision still has steri strips on distal half, although visible portion appears to be healing well     PALPATION: Palpable tightness quad/TFL and hamstrings on operative limb   LOWER EXTREMITY ROM:   Active ROM Right eval Left eval  Knee flexion 106 118  Knee extension -2 0  Ankle dorsiflexion      Ankle plantarflexion      Ankle inversion      Ankle eversion       (Blank rows = not tested)   LOWER EXTREMITY MMT:   MMT Right eval Left eval  Hip flexion      Hip extension      Hip abduction      Hip adduction      Hip internal rotation      Hip external rotation      Knee flexion   WFL  Knee extension   WFL  Ankle dorsiflexion      Ankle plantarflexion      Ankle inversion      Ankle eversion       (Blank rows = not tested) Comments: operative limb not tested given recency of DVT      FUNCTIONAL TESTS:  STS: standard chair, heavy UE support, weight shift to L, stooped posture prior to initiating upright on rollator   Will plan to test TUG vs 5xSTS next session 01/02/2022: TUG: 28 seconds 5xSTS: 28 seconds   GAIT: Distance walked: within clinic Assistive device utilized: rollator Level of assistance: Modified independence Comments: reduced stance on R, reduced knee ROM on R, reduced hip extension B, increased lateral weight shifting     TODAY'S TREATMENT:        [facility] Adult PT Treatment:                                                DATE: 01/10/22 Therapeutic Exercise: Nustep L5 LE/UE  Seated LAQ 1# 3x8 Adductor iso 2x15 STS from lowest mat + bolster 2x8 2inch lateral step up 2x8 with UE support SLR 2x8 SAQ w bolster 2x10 Supine heel slide w/ strap x15  Modalities: Cold pack anterior/posterior knee, 10 min supine w/ bolster, tolerates well, no adverse events   [facility] Adult PT Treatment:  DATE: 01/08/22 Therapeutic Exercise: Nu step L5 LE/UE 5 min during subjective Seated LAQ 2x20, cues for pacing  Adductor isos 3x10, cues for pacing and posture STS from lowest mat + bolster, 2x8 2inch step up 2x8 with UE support SLR 3x5 cues for pacing (mild reduction in TKE but no overt lag) Quad set x15 Supine heel slide with strap x15 RLE only, cues for form and hold  Modalities: 2 cold packs anterior/posterior R knee, supine w bolster under  knee, 10min post session no adverse events   [facility] Adult PT Treatment:                                                DATE: 01/04/22 Therapeutic Exercise: Nu step L5 5min during subjective  LAQ 3x8, cues for positioning STS slightly raised mat, 3x8 SLR limited ROM, 3x5, cues for control  Heel slides w/ strap AAROM 2x12, cues for form Sidelying hip abd RLE, x8 cues for hip positioning  Clamshell no band, 2x10 RLE only 2inch step up w/ gentle UE support, x8, cues for appropriate control and mechanics  Modalities: 2 coldpacks anterior/posterior R knee, supine w/ bolster under knee, 10min post session, no adverse events                                                                                                                           PATIENT EDUCATION:  Education details: rationale for interventions Person educated: Patient Education method: Explanation, Demonstration, Tactile cues, Verbal cues Education comprehension: verbalized understanding, returned demonstration, verbal cues required, tactile cues required, and needs further education    HOME EXERCISE PROGRAM: Access Code: [email or web address] URL: [email or web address] Date: 01/10/2022 Prepared by: Fransisco Hertzavid Yatzari Jonsson  Exercises - Seated Long Arc Quad  - 1 x daily - 7 x weekly - 3 sets - 10 reps - Seated Knee Flexion AAROM  - 1 x daily - 7 x weekly - 3 sets - 10 reps - Small Range Straight Leg Raise  - 1 x daily - 7 x weekly - 3 sets - 5  reps - Sit to Stand with Armchair  - 1 x daily - 7 x weekly - 2 sets - 8 reps   ASSESSMENT:   CLINICAL IMPRESSION: Pt arrives without significant pain, primary report of soreness/stiffness. Pt continues to progress well with exercises emphasizing knee mobility and quad activation. Denies any increase in pain, no adverse events. Requires cues for symmetrical WB and reduced compensations. Updated HEP as above, education on safe performance. Reports excellent response to cold packs at end of session, denies any pain on departure. Pt departs today's session in no acute distress, all voiced questions/concerns addressed appropriately from PT perspective.       OBJECTIVE IMPAIRMENTS: Abnormal gait, decreased activity tolerance, decreased balance, decreased endurance, decreased knowledge of use of DME, decreased mobility, difficulty walking, decreased  ROM, decreased strength, hypomobility, increased edema, impaired flexibility, and pain.    ACTIVITY LIMITATIONS: carrying, lifting, bending, sitting, standing, squatting, stairs, transfers, bathing, toileting, and locomotion level   PARTICIPATION LIMITATIONS: meal prep, cleaning, laundry, driving, shopping, and community activity   PERSONAL FACTORS: Age and 1-2 comorbidities: HTN, DVT  are also affecting patient's functional outcome.    REHAB POTENTIAL: Good   CLINICAL DECISION MAKING: Evolving/moderate complexity   EVALUATION COMPLEXITY: Moderate     GOALS: Goals reviewed with patient? No   SHORT TERM GOALS: Target date: 01/22/2022             Pt will demonstrate appropriate understanding and performance of initially prescribed HEP in order to facilitate improved independence with management of symptoms.  Baseline: HEP provided on eval Goal status: INITIAL    2. Pt will score greater than or equal to 40 on FOTO in order to demonstrate improved perception of function due to symptoms.            Baseline: 33            Goal status: INITIAL     LONG TERM GOALS: Target date: 02/19/2022    Pt will score 55 or greater on FOTO in order to demonstrate improved perception of function due to symptoms.  Baseline: 33 Goal status: INITIAL   2.  Pt will demonstrate at least 120 degrees of R knee flexion AROM in order to facilitate improved tolerance to functional movements such as bending/squatting.  Baseline: see ROM chart above Goal status: INITIAL   3.  Pt will be able to lift up to 10# with less than 2 pt increase in pain on NPS in order to demonstrate improved capacity for daily activities such as laundry/dishes.  Baseline: NT given difficulty with transfer as described above Goal status: INITIAL   4.  Pt will report/demonstrate ability to navigate up to 15 stairs safely in order to promote improved safety w/ home navigation.             Baseline: difficulty/pain with navigating stairs, requires rail            Goal status: INITIAL     PLAN:   PT FREQUENCY: 2x/week   PT DURATION: 8 weeks   PLANNED INTERVENTIONS: Therapeutic exercises, Therapeutic activity, Neuromuscular re-education, Balance training, Gait training, Patient/Family education, Self Care, Joint mobilization, Stair training, DME instructions, Aquatic Therapy, Dry Needling, Electrical stimulation, Cryotherapy, Moist heat, Taping, Manual therapy, and Re-evaluation   PLAN FOR NEXT SESSION:   Progress ROM/strengthening exercises as able/appropriate, review HEP. manual/modalities as indicated/appropriate. Consider gait training with SPC   Ashley Murrain PT, DPT 01/10/2022 11:46 AM

## 2022-01-10 ENCOUNTER — Ambulatory Visit: Payer: Medicare Other | Admitting: Physical Therapy

## 2022-01-10 ENCOUNTER — Encounter: Payer: Self-pay | Admitting: Physical Therapy

## 2022-01-10 DIAGNOSIS — M6281 Muscle weakness (generalized): Secondary | ICD-10-CM

## 2022-01-10 DIAGNOSIS — R2689 Other abnormalities of gait and mobility: Secondary | ICD-10-CM

## 2022-01-10 DIAGNOSIS — M25561 Pain in right knee: Secondary | ICD-10-CM

## 2022-01-10 DIAGNOSIS — M25661 Stiffness of right knee, not elsewhere classified: Secondary | ICD-10-CM

## 2022-01-11 ENCOUNTER — Encounter: Payer: Self-pay | Admitting: Orthopedic Surgery

## 2022-01-11 ENCOUNTER — Ambulatory Visit (INDEPENDENT_AMBULATORY_CARE_PROVIDER_SITE_OTHER): Payer: Medicare Other | Admitting: Orthopedic Surgery

## 2022-01-11 DIAGNOSIS — M1711 Unilateral primary osteoarthritis, right knee: Secondary | ICD-10-CM

## 2022-01-11 NOTE — Progress Notes (Signed)
Post-Op Visit Note   Patient: Cheryl Prince           Date of Birth: Apr 20, 1955           MRN: [id number] Visit Date: 01/11/2022 PCP: Storm Frisk, MD   Assessment & Plan:  Chief Complaint:  Chief Complaint  Patient presents with   Right Knee - Routine Post Op   Visit Diagnoses: No diagnosis found.  Plan: [name] is a patient underwent right total knee replacement 11/29/2021.  Doing well.  Reports some occasional daily soreness.  Takes Tylenol and gabapentin for her symptoms.  On examination no calf tenderness negative Homans.  Range of motion is excellent with easy motion 0-1 20.  No effusion is present.  Less than expected swelling present.  Plan at this time is to continue with home exercise program and physical therapy.  She will get in touch with her primary care physician about the duration of anticoagulation particularly with a trip and mind coming up in December.  In my experience that I discussed with her patients tend to stay on this at least 3 months after diagnosis of DVT.  Plan to see her back in 6 weeks for final check  Follow-Up Instructions: No follow-ups on file.   Orders:  No orders of the defined types were placed in this encounter.  No orders of the defined types were placed in this encounter.   Imaging: No results found.  PMFS History: Patient Active Problem List   Diagnosis Date Noted   S/P total knee arthroplasty, right 12/26/2021   Deep venous thrombosis (HCC) 12/26/2021   Arthritis of knee 11/29/2021   Bipolar I disorder (HCC) 07/26/2020   Right hip pain 07/26/2020   Bronchitis 07/26/2020   Allergies 07/26/2020   GERD (gastroesophageal reflux disease) 07/26/2020   At risk for decreased bone density 07/26/2020   PTSD (post-traumatic stress disorder) 05/30/2016   Alpha thalassemia trait 05/30/2016   Hypertension 05/30/2016   Rotator cuff tendonitis, left 05/30/2016   Osteoarthritis of left knee 05/30/2016   Carpal tunnel syndrome  05/30/2016   Prediabetes 05/30/2016   Onychomycosis 05/30/2016   Past Medical History:  Diagnosis Date   Allergy    Anemia    Anxiety    Arthritis    Asthma    borderline bronchitis   Bronchitis    GERD (gastroesophageal reflux disease)    Goiter, toxic diffuse 05/30/2016   Hypertension    S/P tonsillectomy 05/30/2016   Sleep apnea    history of, MD tookpt. off machine 7 yrs, ago    Family History  Problem Relation Age of Onset   Hypertension Father    Diabetes Father    Lupus Daughter    Colon cancer Maternal Aunt    Colon cancer Maternal Uncle        4 mat uncles dx colon ca   Esophageal cancer Neg Hx    Rectal cancer Neg Hx    Stomach cancer Neg Hx     Past Surgical History:  Procedure Laterality Date   ABDOMINAL HYSTERECTOMY     adnoides     BREAST SURGERY     CHOLECYSTECTOMY     COLONOSCOPY     DILATION AND CURETTAGE OF UTERUS     GASTRIC BYPASS  1998   IUD REMOVAL     TONSILLECTOMY     TOTAL KNEE ARTHROPLASTY Left 09/22/2018   TOTAL KNEE ARTHROPLASTY Left 09/22/2018   Procedure: LEFT TOTAL KNEE ARTHROPLASTY;  Surgeon: Rise Paganini  Lorin Picket, MD;  Location: [facility] OR;  Service: Orthopedics;  Laterality: Left;   TOTAL KNEE ARTHROPLASTY Right 11/29/2021   Procedure: RIGHT TOTAL KNEE ARTHROPLASTY;  Surgeon: Cammy Copa, MD;  Location: [facility] OR;  Service: Orthopedics;  Laterality: Right;   Social History   Occupational History   Not on file  Tobacco Use   Smoking status: Former    Packs/day: 1.50    Years: 18.00    Total pack years: 27.00    Types: Cigarettes    Quit date: 09/16/1984    Years since quitting: 37.3   Smokeless tobacco: Never   Tobacco comments:    quit 1986  Vaping Use   Vaping Use: Never used  Substance and Sexual Activity   Alcohol use: Yes    Comment: social   Drug use: No   Sexual activity: Not on file

## 2022-01-12 NOTE — Therapy (Signed)
OUTPATIENT PHYSICAL THERAPY TREATMENT NOTE   Patient Name: Cheryl Prince MRN: [id number] DOB:1955/03/28, 66 y.o., female Today's Date: 01/14/2022  PCP: Storm Frisk, MD  REFERRING PROVIDER: Julieanne Cotton, PA-C   END OF SESSION:   PT End of Session - 01/14/22 0956     Visit Number 6    Number of Visits 17    Date for PT Re-Evaluation 02/19/22    Authorization Type [employer] Medicare    Progress Note Due on Visit 10    PT Start Time 0955    PT Stop Time 1040    PT Time Calculation (min) 45 min    Activity Tolerance Patient tolerated treatment well;No increased pain    Behavior During Therapy WFL for tasks assessed/performed             Past Medical History:  Diagnosis Date   Allergy    Anemia    Anxiety    Arthritis    Asthma    borderline bronchitis   Bronchitis    GERD (gastroesophageal reflux disease)    Goiter, toxic diffuse 05/30/2016   Hypertension    S/P tonsillectomy 05/30/2016   Sleep apnea    history of, MD tookpt. off machine 7 yrs, ago   Past Surgical History:  Procedure Laterality Date   ABDOMINAL HYSTERECTOMY     adnoides     BREAST SURGERY     CHOLECYSTECTOMY     COLONOSCOPY     DILATION AND CURETTAGE OF UTERUS     GASTRIC BYPASS  1998   IUD REMOVAL     TONSILLECTOMY     TOTAL KNEE ARTHROPLASTY Left 09/22/2018   TOTAL KNEE ARTHROPLASTY Left 09/22/2018   Procedure: LEFT TOTAL KNEE ARTHROPLASTY;  Surgeon: Cammy Copa, MD;  Location: [facility] OR;  Service: Orthopedics;  Laterality: Left;   TOTAL KNEE ARTHROPLASTY Right 11/29/2021   Procedure: RIGHT TOTAL KNEE ARTHROPLASTY;  Surgeon: Cammy Copa, MD;  Location: [facility] OR;  Service: Orthopedics;  Laterality: Right;   Patient Active Problem List   Diagnosis Date Noted   S/P total knee arthroplasty, right 12/26/2021   Deep venous thrombosis (HCC) 12/26/2021   Arthritis of knee 11/29/2021   Bipolar I disorder (HCC) 07/26/2020   Right hip pain 07/26/2020   Bronchitis 07/26/2020    Allergies 07/26/2020   GERD (gastroesophageal reflux disease) 07/26/2020   At risk for decreased bone density 07/26/2020   PTSD (post-traumatic stress disorder) 05/30/2016   Alpha thalassemia trait 05/30/2016   Hypertension 05/30/2016   Rotator cuff tendonitis, left 05/30/2016   Osteoarthritis of left knee 05/30/2016   Carpal tunnel syndrome 05/30/2016   Prediabetes 05/30/2016   Onychomycosis 05/30/2016    REFERRING DIAG: Z96.651 (ICD-10-CM) - Hx of total knee arthroplasty, right   THERAPY DIAG:  Right knee pain, unspecified chronicity  Stiffness of right knee, not elsewhere classified  Muscle weakness (generalized)  Other abnormalities of gait and mobility  Rationale for Evaluation and Treatment Rehabilitation  PERTINENT HISTORY: HTN, recent DVT on anticoagulant, GERD, TKA 11/29/21 on RLE, prior L TKA   PRECAUTIONS: Knee and Fall, recent DVT on anticoagulation   ONSET DATE: Oct 5th 2023   SUBJECTIVE:  SUBJECTIVE STATEMENT:    Patient reports she took her medications before PT today.    PAIN:  Are you having pain: 7/10 Location: R TKA How would you describe your pain? Throbbing, sharp, pressure Best in past week: 0/10 Worst in past week: 8/10 Aggravating factors: stair navigation, walking more than household distances,  Easing factors: medication, rest, ice    OBJECTIVE: (objective measures completed at initial evaluation unless otherwise dated)   PATIENT SURVEYS:  FOTO 33 01/14/22: 30%   COGNITION: Overall cognitive status: Within functional limits for tasks assessed                         SENSATION: Light touch intact B LE although pt reports subjective numbness along lateral portion of incision   EDEMA/INSPECTION:  Circumferential: 38cm L joint line, 39.6 cm R joint line   12cm distal to joint line 38cm RLE, 38.5cm LLE Incision still has steri strips on distal half, although visible portion appears to be healing well     PALPATION: Palpable tightness quad/TFL and hamstrings on operative limb   LOWER EXTREMITY ROM:   Active ROM Right eval Left eval Right 01/14/22  Knee flexion 106 118 106  Knee extension -2 0   Ankle dorsiflexion       Ankle plantarflexion       Ankle inversion       Ankle eversion        (Blank rows = not tested)   LOWER EXTREMITY MMT:   MMT Right eval Left eval  Hip flexion      Hip extension      Hip abduction      Hip adduction      Hip internal rotation      Hip external rotation      Knee flexion   WFL  Knee extension   WFL  Ankle dorsiflexion      Ankle plantarflexion      Ankle inversion      Ankle eversion       (Blank rows = not tested) Comments: operative limb not tested given recency of DVT      FUNCTIONAL TESTS:  STS: standard chair, heavy UE support, weight shift to L, stooped posture prior to initiating upright on rollator   Will plan to test TUG vs 5xSTS next session 01/02/2022: TUG: 28 seconds 5xSTS: 28 seconds   GAIT: Distance walked: within clinic Assistive device utilized: rollator Level of assistance: Modified independence Comments: reduced stance on R, reduced knee ROM on R, reduced hip extension B, increased lateral weight shifting     TODAY'S TREATMENT:      [facility] Adult PT Treatment:                                                DATE: 01/14/22 Therapeutic Exercise: Nustep L5 LE/UE  Seated LAQ 2# 3x8 Adductor iso 3" hold 2x10 STS from lowest mat 2x8 2inch lateral step up 2x10 with UE support SLR 2x10 SAQ w bolster 1.5# 2x10 Supine heel slide w/ strap x15 Sidelying hip abduction 2x8 Rt Modalities: Cold pack anterior/posterior knee, 10 min supine w/ bolster, tolerates well, no adverse events    [facility] Adult PT Treatment:  DATE:  01/10/22 Therapeutic Exercise: Nustep L5 LE/UE  Seated LAQ 1# 3x8 Adductor iso 2x15 STS from lowest mat + bolster 2x8 2inch lateral step up 2x8 with UE support SLR 2x8 SAQ w bolster 2x10 Supine heel slide w/ strap x15  Modalities: Cold pack anterior/posterior knee, 10 min supine w/ bolster, tolerates well, no adverse events   [facility] Adult PT Treatment:                                                DATE: 01/08/22 Therapeutic Exercise: Nu step L5 LE/UE 5 min during subjective Seated LAQ 2x20, cues for pacing  Adductor isos 3x10, cues for pacing and posture STS from lowest mat + bolster, 2x8 2inch step up 2x8 with UE support SLR 3x5 cues for pacing (mild reduction in TKE but no overt lag) Quad set x15 Supine heel slide with strap x15 RLE only, cues for form and hold  Modalities: 2 cold packs anterior/posterior R knee, supine w bolster under  knee, post session no adverse events                    PATIENT EDUCATION:  Education details: rationale for interventions Person educated: Patient Education method: Explanation, Demonstration, Tactile cues, Verbal cues Education comprehension: verbalized understanding, returned demonstration, verbal cues required, tactile cues required, and needs further education    HOME EXERCISE PROGRAM: Access Code: [email or web address] URL: [email or web address] Date: 01/10/2022 Prepared by: Fransisco Hertz  Exercises - Seated Long Arc Quad  - 1 x daily - 7 x weekly - 3 sets - 10 reps - Seated Knee Flexion AAROM  - 1 x daily - 7 x weekly - 3 sets - 10 reps - Small Range Straight Leg Raise  - 1 x daily - 7 x weekly - 3 sets - 5 reps - Sit to Stand with Armchair  - 1 x daily - 7 x weekly - 2 sets - 8 reps   ASSESSMENT:   CLINICAL IMPRESSION: Patient presents to PT with increased pain in the anterior Rt knee today and reports HEP compliance. Re-administered FOTO this session with patient slightly regressing, despite subjective reports  of improvement since beginning PT. Session today continued to focus on LE strengthening and ROM of Rt knee. Patient was able to tolerate all prescribed exercises with no adverse effects. Patient continues to benefit from skilled PT services and should be progressed as able to improve functional independence.     OBJECTIVE IMPAIRMENTS: Abnormal gait, decreased activity tolerance, decreased balance, decreased endurance, decreased knowledge of use of DME, decreased mobility, difficulty walking, decreased ROM, decreased strength, hypomobility, increased edema, impaired flexibility, and pain.    ACTIVITY LIMITATIONS: carrying, lifting, bending, sitting, standing, squatting, stairs, transfers, bathing, toileting, and locomotion level   PARTICIPATION LIMITATIONS: meal prep, cleaning, laundry, driving, shopping, and community activity   PERSONAL FACTORS: Age and 1-2 comorbidities: HTN, DVT  are also affecting patient's functional outcome.    REHAB POTENTIAL: Good   CLINICAL DECISION MAKING: Evolving/moderate complexity   EVALUATION COMPLEXITY: Moderate     GOALS: Goals reviewed with patient? No   SHORT TERM GOALS: Target date: 01/22/2022             Pt will demonstrate appropriate understanding and performance of initially prescribed HEP in order to facilitate improved independence with management of symptoms.  Baseline: HEP provided on  eval Goal status: INITIAL    2. Pt will score greater than or equal to 40 on FOTO in order to demonstrate improved perception of function due to symptoms.            Baseline: 33            Goal status: INITIAL    LONG TERM GOALS: Target date: 02/19/2022    Pt will score 55 or greater on FOTO in order to demonstrate improved perception of function due to symptoms.  Baseline: 33 Goal status: INITIAL   2.  Pt will demonstrate at least 120 degrees of R knee flexion AROM in order to facilitate improved tolerance to functional movements such as  bending/squatting.  Baseline: see ROM chart above Goal status: INITIAL   3.  Pt will be able to lift up to 10# with less than 2 pt increase in pain on NPS in order to demonstrate improved capacity for daily activities such as laundry/dishes.  Baseline: NT given difficulty with transfer as described above Goal status: INITIAL   4.  Pt will report/demonstrate ability to navigate up to 15 stairs safely in order to promote improved safety w/ home navigation.             Baseline: difficulty/pain with navigating stairs, requires rail            Goal status: INITIAL     PLAN:   PT FREQUENCY: 2x/week   PT DURATION: 8 weeks   PLANNED INTERVENTIONS: Therapeutic exercises, Therapeutic activity, Neuromuscular re-education, Balance training, Gait training, Patient/Family education, Self Care, Joint mobilization, Stair training, DME instructions, Aquatic Therapy, Dry Needling, Electrical stimulation, Cryotherapy, Moist heat, Taping, Manual therapy, and Re-evaluation   PLAN FOR NEXT SESSION:   Progress ROM/strengthening exercises as able/appropriate, review HEP. manual/modalities as indicated/appropriate. Consider gait training with SPC   Berta MinorStephanie Williams, PTA 01/14/22 10:34 AM

## 2022-01-14 ENCOUNTER — Ambulatory Visit: Payer: Medicare Other

## 2022-01-14 DIAGNOSIS — M6281 Muscle weakness (generalized): Secondary | ICD-10-CM

## 2022-01-14 DIAGNOSIS — M25561 Pain in right knee: Secondary | ICD-10-CM | POA: Diagnosis not present

## 2022-01-14 DIAGNOSIS — M25661 Stiffness of right knee, not elsewhere classified: Secondary | ICD-10-CM

## 2022-01-14 DIAGNOSIS — R2689 Other abnormalities of gait and mobility: Secondary | ICD-10-CM

## 2022-01-15 ENCOUNTER — Telehealth: Payer: Self-pay | Admitting: Emergency Medicine

## 2022-01-15 ENCOUNTER — Encounter: Payer: Medicare Other | Admitting: Physical Therapy

## 2022-01-15 MED ORDER — APIXABAN 5 MG PO TABS: 5.0000 mg | ORAL_TABLET | Freq: Two times a day (BID) | ORAL | 1 refills | Status: DC

## 2022-01-15 NOTE — Telephone Encounter (Signed)
Called patient to clarify  wanting to know if she should continue with the eliquis after she is done with her pills

## 2022-01-15 NOTE — Telephone Encounter (Signed)
Called and patient and she is aware

## 2022-01-15 NOTE — Telephone Encounter (Signed)
Copied from CRM [id number]. Topic: General - Other >> Jan 11, 2022  3:20 PM [place] B wrote: Reason for CRM: Patient called in says blood clot med runs out in a week and  a half and she wants to know if he wants to her to continue with this med  when it runs out. Please call back

## 2022-01-15 NOTE — Therapy (Signed)
OUTPATIENT PHYSICAL THERAPY TREATMENT NOTE   Patient Name: Cheryl Prince MRN: [id number] DOB:[birth year], 66 y.o., female Today's Date: 01/16/2022  PCP: Storm Frisk, MD  REFERRING PROVIDER: Julieanne Cotton, PA-C   END OF SESSION:   PT End of Session - 01/16/22 1102     Visit Number 7    Number of Visits 17    Date for PT Re-Evaluation 02/19/22    Authorization Type [employer] Medicare    Progress Note Due on Visit 10    PT Start Time 1102    PT Stop Time 1153    PT Time Calculation (min) 51 min    Activity Tolerance Patient tolerated treatment well    Behavior During Therapy WFL for tasks assessed/performed              Past Medical History:  Diagnosis Date   Allergy    Anemia    Anxiety    Arthritis    Asthma    borderline bronchitis   Bronchitis    GERD (gastroesophageal reflux disease)    Goiter, toxic diffuse 05/30/2016   Hypertension    S/P tonsillectomy 05/30/2016   Sleep apnea    history of, MD tookpt. off machine 7 yrs, ago   Past Surgical History:  Procedure Laterality Date   ABDOMINAL HYSTERECTOMY     adnoides     BREAST SURGERY     CHOLECYSTECTOMY     COLONOSCOPY     DILATION AND CURETTAGE OF UTERUS     GASTRIC BYPASS  1998   IUD REMOVAL     TONSILLECTOMY     TOTAL KNEE ARTHROPLASTY Left 09/22/2018   TOTAL KNEE ARTHROPLASTY Left 09/22/2018   Procedure: LEFT TOTAL KNEE ARTHROPLASTY;  Surgeon: Cammy Copa, MD;  Location: [facility] OR;  Service: Orthopedics;  Laterality: Left;   TOTAL KNEE ARTHROPLASTY Right 11/29/2021   Procedure: RIGHT TOTAL KNEE ARTHROPLASTY;  Surgeon: Cammy Copa, MD;  Location: [facility] OR;  Service: Orthopedics;  Laterality: Right;   Patient Active Problem List   Diagnosis Date Noted   S/P total knee arthroplasty, right 12/26/2021   Deep venous thrombosis (HCC) 12/26/2021   Arthritis of knee 11/29/2021   Bipolar I disorder (HCC) 07/26/2020   Right hip pain 07/26/2020   Bronchitis 07/26/2020   Allergies  07/26/2020   GERD (gastroesophageal reflux disease) 07/26/2020   At risk for decreased bone density 07/26/2020   PTSD (post-traumatic stress disorder) 05/30/2016   Alpha thalassemia trait 05/30/2016   Hypertension 05/30/2016   Rotator cuff tendonitis, left 05/30/2016   Osteoarthritis of left knee 05/30/2016   Carpal tunnel syndrome 05/30/2016   Prediabetes 05/30/2016   Onychomycosis 05/30/2016    REFERRING DIAG: Z96.651 (ICD-10-CM) - Hx of total knee arthroplasty, right   THERAPY DIAG:  Right knee pain, unspecified chronicity  Stiffness of right knee, not elsewhere classified  Muscle weakness (generalized)  Other abnormalities of gait and mobility  Rationale for Evaluation and Treatment Rehabilitation  PERTINENT HISTORY: HTN, recent DVT on anticoagulant, GERD, TKA 11/29/21 on RLE, prior L TKA   PRECAUTIONS: Knee and Fall, recent DVT on anticoagulation   ONSET DATE: Oct 5th 2023   SUBJECTIVE:  SUBJECTIVE STATEMENT:    Patient reports a dull pain in the knee. She reports compliance with HEP.     PAIN:  Are you having pain: 6/10 Location: Rt knee How would you describe your pain? Throbbing, sharp, pressure Aggravating factors: stair navigation, walking more than household distances,  Easing factors: medication, rest, ice    OBJECTIVE: (objective measures completed at initial evaluation unless otherwise dated)   PATIENT SURVEYS:  FOTO 33 01/14/22: 30%   COGNITION: Overall cognitive status: Within functional limits for tasks assessed                         SENSATION: Light touch intact B LE although pt reports subjective numbness along lateral portion of incision   EDEMA/INSPECTION:  Circumferential: 38cm L joint line, 39.6 cm R joint line  12cm distal to joint line 38cm RLE, 38.5cm  LLE Incision still has steri strips on distal half, although visible portion appears to be healing well     PALPATION: Palpable tightness quad/TFL and hamstrings on operative limb   LOWER EXTREMITY ROM:   Active ROM Right eval Left eval Right 01/14/22  Knee flexion 106 118 106  Knee extension -2 0   Ankle dorsiflexion       Ankle plantarflexion       Ankle inversion       Ankle eversion        (Blank rows = not tested)   LOWER EXTREMITY MMT:   MMT Right eval Left eval  Hip flexion      Hip extension      Hip abduction      Hip adduction      Hip internal rotation      Hip external rotation      Knee flexion   WFL  Knee extension   WFL  Ankle dorsiflexion      Ankle plantarflexion      Ankle inversion      Ankle eversion       (Blank rows = not tested) Comments: operative limb not tested given recency of DVT      FUNCTIONAL TESTS:  STS: standard chair, heavy UE support, weight shift to L, stooped posture prior to initiating upright on rollator   Will plan to test TUG vs 5xSTS next session 01/02/2022: TUG: 28 seconds 5xSTS: 28 seconds   GAIT: Distance walked: within clinic Assistive device utilized: rollator Level of assistance: Modified independence Comments: reduced stance on R, reduced knee ROM on R, reduced hip extension B, increased lateral weight shifting     TODAY'S TREATMENT:      [facility] Adult PT Treatment:                                                DATE: 01/16/22 Therapeutic Exercise: NuStep level 5 x 5 minutes UE/LE SLR 2 x 10  Hip bridge 2 x 10  Sit to stand 2 x 10; 5 lb kettlebell  Standing hip abduction 2 x 10 Standing hip extension 2 x 10  Standing HS curl 2 x 10  TKE blue band 2 x 10  LAQ 2 x 10; 2#   Modalities: Cold pack anterior/posterior knee, 10 min supine w/ bolster, tolerates well, no adverse events    [facility] Adult PT Treatment:  DATE: 01/14/22 Therapeutic Exercise: Nustep L5  LE/UE  Seated LAQ 2# 3x8 Adductor iso 3" hold 2x10 STS from lowest mat 2x8 2inch lateral step up 2x10 with UE support SLR 2x10 SAQ w bolster 1.5# 2x10 Supine heel slide w/ strap x15 Sidelying hip abduction 2x8 Rt Modalities: Cold pack anterior/posterior knee, 10 min supine w/ bolster, tolerates well, no adverse events    [facility] Adult PT Treatment:                                                DATE: 01/10/22 Therapeutic Exercise: Nustep L5 LE/UE  Seated LAQ 1# 3x8 Adductor iso 2x15 STS from lowest mat + bolster 2x8 2inch lateral step up 2x8 with UE support SLR 2x8 SAQ w bolster 2x10 Supine heel slide w/ strap x15  Modalities: Cold pack anterior/posterior knee, 10 min supine w/ bolster, tolerates well, no adverse events     PATIENT EDUCATION:  Education details: rationale for interventions Person educated: Patient Education method: Explanation Education comprehension: verbalized understanding   HOME EXERCISE PROGRAM: Access Code: [email or web address] URL: [email or web address] Date: 01/10/2022 Prepared by: Fransisco Hertz  Exercises - Seated Long Arc Quad  - 1 x daily - 7 x weekly - 3 sets - 10 reps - Seated Knee Flexion AAROM  - 1 x daily - 7 x weekly - 3 sets - 10 reps - Small Range Straight Leg Raise  - 1 x daily - 7 x weekly - 3 sets - 5 reps - Sit to Stand with Armchair  - 1 x daily - 7 x weekly - 2 sets - 8 reps   ASSESSMENT:   CLINICAL IMPRESSION: Patient reports moderate pain about the Rt knee upon arrival. Continued emphasis on knee and hip strengthening, with progression of standing activity. Occasional quad lag noted with SLR requiring cues to maintain quad activation. She quickly fatigues with standing activity, but overall good tolerance without an increase in knee pain reported.     OBJECTIVE IMPAIRMENTS: Abnormal gait, decreased activity tolerance, decreased balance, decreased endurance, decreased knowledge of use of DME, decreased mobility,  difficulty walking, decreased ROM, decreased strength, hypomobility, increased edema, impaired flexibility, and pain.    ACTIVITY LIMITATIONS: carrying, lifting, bending, sitting, standing, squatting, stairs, transfers, bathing, toileting, and locomotion level   PARTICIPATION LIMITATIONS: meal prep, cleaning, laundry, driving, shopping, and community activity   PERSONAL FACTORS: Age and 1-2 comorbidities: HTN, DVT  are also affecting patient's functional outcome.    REHAB POTENTIAL: Good   CLINICAL DECISION MAKING: Evolving/moderate complexity   EVALUATION COMPLEXITY: Moderate     GOALS: Goals reviewed with patient? No   SHORT TERM GOALS: Target date: 01/22/2022             Pt will demonstrate appropriate understanding and performance of initially prescribed HEP in order to facilitate improved independence with management of symptoms.  Baseline: HEP provided on eval Goal status: INITIAL    2. Pt will score greater than or equal to 40 on FOTO in order to demonstrate improved perception of function due to symptoms.            Baseline: 33            Goal status: INITIAL    LONG TERM GOALS: Target date: 02/19/2022    Pt will score 55 or greater on FOTO in order to demonstrate improved perception  of function due to symptoms.  Baseline: 33 Goal status: INITIAL   2.  Pt will demonstrate at least 120 degrees of R knee flexion AROM in order to facilitate improved tolerance to functional movements such as bending/squatting.  Baseline: see ROM chart above Goal status: INITIAL   3.  Pt will be able to lift up to 10# with less than 2 pt increase in pain on NPS in order to demonstrate improved capacity for daily activities such as laundry/dishes.  Baseline: NT given difficulty with transfer as described above Goal status: INITIAL   4.  Pt will report/demonstrate ability to navigate up to 15 stairs safely in order to promote improved safety w/ home navigation.             Baseline:  difficulty/pain with navigating stairs, requires rail            Goal status: INITIAL     PLAN:   PT FREQUENCY: 2x/week   PT DURATION: 8 weeks   PLANNED INTERVENTIONS: Therapeutic exercises, Therapeutic activity, Neuromuscular re-education, Balance training, Gait training, Patient/Family education, Self Care, Joint mobilization, Stair training, DME instructions, Aquatic Therapy, Dry Needling, Electrical stimulation, Cryotherapy, Moist heat, Taping, Manual therapy, and Re-evaluation   PLAN FOR NEXT SESSION:   Progress ROM/strengthening exercises as able/appropriate, review HEP. manual/modalities as indicated/appropriate. Consider gait training with SPC  Letitia Libra, PT, DPT, ATC 01/16/22 11:43 AM

## 2022-01-15 NOTE — Addendum Note (Signed)
Addended by: Lois Huxley, Jeannett Senior L on: 01/15/2022 03:46 PM   Modules accepted: Orders

## 2022-01-15 NOTE — Telephone Encounter (Signed)
She needs to stay on therapy 3 months total. Franky Macho can you send in refill for me on her [facility]

## 2022-01-15 NOTE — Telephone Encounter (Signed)
Yes sir sent for 5mg  BID continuation dose. Sent to her [facility].

## 2022-01-16 ENCOUNTER — Ambulatory Visit: Payer: Medicare Other

## 2022-01-16 DIAGNOSIS — M25561 Pain in right knee: Secondary | ICD-10-CM | POA: Diagnosis not present

## 2022-01-16 DIAGNOSIS — M25661 Stiffness of right knee, not elsewhere classified: Secondary | ICD-10-CM

## 2022-01-16 DIAGNOSIS — M6281 Muscle weakness (generalized): Secondary | ICD-10-CM

## 2022-01-16 DIAGNOSIS — R2689 Other abnormalities of gait and mobility: Secondary | ICD-10-CM

## 2022-01-16 NOTE — Telephone Encounter (Signed)
Thank you both!

## 2022-01-22 ENCOUNTER — Ambulatory Visit: Payer: Medicare Other | Admitting: Physical Therapy

## 2022-01-22 ENCOUNTER — Encounter: Payer: Self-pay | Admitting: Physical Therapy

## 2022-01-22 DIAGNOSIS — M25561 Pain in right knee: Secondary | ICD-10-CM | POA: Diagnosis not present

## 2022-01-22 DIAGNOSIS — M6281 Muscle weakness (generalized): Secondary | ICD-10-CM

## 2022-01-22 DIAGNOSIS — M25661 Stiffness of right knee, not elsewhere classified: Secondary | ICD-10-CM

## 2022-01-22 DIAGNOSIS — R2689 Other abnormalities of gait and mobility: Secondary | ICD-10-CM

## 2022-01-22 NOTE — Therapy (Signed)
OUTPATIENT PHYSICAL THERAPY TREATMENT NOTE   Patient Name: Cheryl Prince MRN: [id number] DOB:1956/02/06, 66 y.o., female Today's Date: 01/22/2022  PCP: Storm Frisk, MD  REFERRING PROVIDER: Julieanne Cotton, PA-C   END OF SESSION:   PT End of Session - 01/22/22 1015     Visit Number 8    Number of Visits 17    Date for PT Re-Evaluation 02/19/22    Authorization Type [employer] Medicare    Progress Note Due on Visit 10    PT Start Time 1015    PT Stop Time 1107   time not billed for cold pack   PT Time Calculation (min) 52 min    Activity Tolerance Patient tolerated treatment well    Behavior During Therapy WFL for tasks assessed/performed               Past Medical History:  Diagnosis Date   Allergy    Anemia    Anxiety    Arthritis    Asthma    borderline bronchitis   Bronchitis    GERD (gastroesophageal reflux disease)    Goiter, toxic diffuse 05/30/2016   Hypertension    S/P tonsillectomy 05/30/2016   Sleep apnea    history of, MD tookpt. off machine 7 yrs, ago   Past Surgical History:  Procedure Laterality Date   ABDOMINAL HYSTERECTOMY     adnoides     BREAST SURGERY     CHOLECYSTECTOMY     COLONOSCOPY     DILATION AND CURETTAGE OF UTERUS     GASTRIC BYPASS  1998   IUD REMOVAL     TONSILLECTOMY     TOTAL KNEE ARTHROPLASTY Left 09/22/2018   TOTAL KNEE ARTHROPLASTY Left 09/22/2018   Procedure: LEFT TOTAL KNEE ARTHROPLASTY;  Surgeon: Cammy Copa, MD;  Location: [facility] OR;  Service: Orthopedics;  Laterality: Left;   TOTAL KNEE ARTHROPLASTY Right 11/29/2021   Procedure: RIGHT TOTAL KNEE ARTHROPLASTY;  Surgeon: Cammy Copa, MD;  Location: [facility] OR;  Service: Orthopedics;  Laterality: Right;   Patient Active Problem List   Diagnosis Date Noted   S/P total knee arthroplasty, right 12/26/2021   Deep venous thrombosis (HCC) 12/26/2021   Arthritis of knee 11/29/2021   Bipolar I disorder (HCC) 07/26/2020   Right hip pain 07/26/2020    Bronchitis 07/26/2020   Allergies 07/26/2020   GERD (gastroesophageal reflux disease) 07/26/2020   At risk for decreased bone density 07/26/2020   PTSD (post-traumatic stress disorder) 05/30/2016   Alpha thalassemia trait 05/30/2016   Hypertension 05/30/2016   Rotator cuff tendonitis, left 05/30/2016   Osteoarthritis of left knee 05/30/2016   Carpal tunnel syndrome 05/30/2016   Prediabetes 05/30/2016   Onychomycosis 05/30/2016    REFERRING DIAG: Z96.651 (ICD-10-CM) - Hx of total knee arthroplasty, right   THERAPY DIAG:  Right knee pain, unspecified chronicity  Stiffness of right knee, not elsewhere classified  Muscle weakness (generalized)  Other abnormalities of gait and mobility  Rationale for Evaluation and Treatment Rehabilitation  PERTINENT HISTORY: HTN, recent DVT on anticoagulant, GERD, TKA 11/29/21 on RLE, prior L TKA   PRECAUTIONS: Knee and Fall, recent DVT on anticoagulation   ONSET DATE: Oct 5th 2023   SUBJECTIVE:  SUBJECTIVE STATEMENT:    Reports 5/10 pain with movement, 0/10 resting pain in knee. No other new updates, sees Careers adviser in January.    PAIN:  Are you having pain: 5/10 Location: Rt knee How would you describe your pain? Throbbing, sharp, pressure Aggravating factors: stair navigation, walking more than household distances,  Easing factors: medication, rest, ice    OBJECTIVE: (objective measures completed at initial evaluation unless otherwise dated)   PATIENT SURVEYS:  FOTO 33 01/14/22: 30%   COGNITION: Overall cognitive status: Within functional limits for tasks assessed                         SENSATION: Light touch intact B LE although pt reports subjective numbness along lateral portion of incision   EDEMA/INSPECTION:  Circumferential: 38cm L joint line,  39.6 cm R joint line  12cm distal to joint line 38cm RLE, 38.5cm LLE Incision still has steri strips on distal half, although visible portion appears to be healing well     PALPATION: Palpable tightness quad/TFL and hamstrings on operative limb   LOWER EXTREMITY ROM:   Active ROM Right eval Left eval Right 01/14/22  Knee flexion 106 118 106  Knee extension -2 0   Ankle dorsiflexion       Ankle plantarflexion       Ankle inversion       Ankle eversion        (Blank rows = not tested)   LOWER EXTREMITY MMT:   MMT Right eval Left eval  Hip flexion      Hip extension      Hip abduction      Hip adduction      Hip internal rotation      Hip external rotation      Knee flexion   WFL  Knee extension   WFL  Ankle dorsiflexion      Ankle plantarflexion      Ankle inversion      Ankle eversion       (Blank rows = not tested) Comments: operative limb not tested given recency of DVT      FUNCTIONAL TESTS:  STS: standard chair, heavy UE support, weight shift to L, stooped posture prior to initiating upright on rollator   Will plan to test TUG vs 5xSTS next session 01/02/2022: TUG: 28 seconds 5xSTS: 28 seconds   GAIT: Distance walked: within clinic Assistive device utilized: rollator Level of assistance: Modified independence Comments: reduced stance on R, reduced knee ROM on R, reduced hip extension B, increased lateral weight shifting     TODAY'S TREATMENT:      [facility] Adult PT Treatment:                                                DATE: 01/22/22 Therapeutic Exercise: Recumbent bike half revolutions 5 min STS 5# KB 3x10, cues for form and pacing  Standing hamstring curl 2.5# each LE, 2x8 cues for form and pacing Standing heel raises 2.5# 2x12 at counter Blue TKE 2x15 cues for reduced compensation at hip Gait Training: SPC gait training ~237ft; CGA weaning to SBA. Education/cues for appropriate SPC use, weight shifting, upright posture Cone step overs 3 laps (5  cones), SBA, for improved clearance, cues for heel strike and posture, reduced frontal plane compensations Modalities: Cold pack anterior/posterior knee,  10 min supine w/ bolster, tolerates well, no adverse events   [facility] Adult PT Treatment:                                                DATE: 01/16/22 Therapeutic Exercise: NuStep level 5 x 5 minutes UE/LE SLR 2 x 10  Hip bridge 2 x 10  Sit to stand 2 x 10; 5 lb kettlebell  Standing hip abduction 2 x 10 Standing hip extension 2 x 10  Standing HS curl 2 x 10  TKE blue band 2 x 10  LAQ 2 x 10; 2#   Modalities: Cold pack anterior/posterior knee, 10 min supine w/ bolster, tolerates well, no adverse events  [facility] Adult PT Treatment:                                                DATE: 01/14/22 Therapeutic Exercise: Nustep L5 LE/UE 5min  Seated LAQ 2# 3x8 Adductor iso 3" hold 2x10 STS from lowest mat 2x8 2inch lateral step up 2x10 with UE support SLR 2x10 SAQ w bolster 1.5# 2x10 Supine heel slide w/ strap x15 Sidelying hip abduction 2x8 Rt Modalities: Cold pack anterior/posterior knee, 10 min supine w/ bolster, tolerates well, no adverse events   PATIENT EDUCATION:  Education details: rationale for interventions, safety w/ AD use Person educated: Patient Education method: Explanation Education comprehension: verbalized understanding   HOME EXERCISE PROGRAM: Access Code: [email or web address] URL: [email or web address] Date: 01/10/2022 Prepared by: Fransisco Hertzavid Antavia Tandy  Exercises - Seated Long Arc Quad  - 1 x daily - 7 x weekly - 3 sets - 10 reps - Seated Knee Flexion AAROM  - 1 x daily - 7 x weekly - 3 sets - 10 reps - Small Range Straight Leg Raise  - 1 x daily - 7 x weekly - 3 sets - 5 reps - Sit to Stand with Armchair  - 1 x daily - 7 x weekly - 2 sets - 8 reps   ASSESSMENT:   CLINICAL IMPRESSION: Pt arrives with moderate pain, no other new updates. Introduction of recumbent bike, initially with half revolutions but with  repetition able to complete full revolutions with effort. Followed with progression for closed chain strengthening and knee mobility. Progression to gait training with SPC on this date - initial CGA for safety w/ new AD but pt tolerates well and demonstrates no signs of instability, cues for gait mechanics as above. Pt denies any increase in pain as session goes on, no adverse events. Reports excellent relief from cold pack at end of session. Pt departs today's session in no acute distress, all voiced questions/concerns addressed appropriately from PT perspective.      OBJECTIVE IMPAIRMENTS: Abnormal gait, decreased activity tolerance, decreased balance, decreased endurance, decreased knowledge of use of DME, decreased mobility, difficulty walking, decreased ROM, decreased strength, hypomobility, increased edema, impaired flexibility, and pain.    ACTIVITY LIMITATIONS: carrying, lifting, bending, sitting, standing, squatting, stairs, transfers, bathing, toileting, and locomotion level   PARTICIPATION LIMITATIONS: meal prep, cleaning, laundry, driving, shopping, and community activity   PERSONAL FACTORS: Age and 1-2 comorbidities: HTN, DVT  are also affecting patient's functional outcome.    REHAB POTENTIAL: Good   CLINICAL DECISION MAKING: Evolving/moderate complexity  EVALUATION COMPLEXITY: Moderate     GOALS: Goals reviewed with patient? No   SHORT TERM GOALS: Target date: 01/22/2022             Pt will demonstrate appropriate understanding and performance of initially prescribed HEP in order to facilitate improved independence with management of symptoms.  Baseline: HEP provided on eval Goal status: INITIAL    2. Pt will score greater than or equal to 40 on FOTO in order to demonstrate improved perception of function due to symptoms.            Baseline: 33            Goal status: INITIAL    LONG TERM GOALS: Target date: 02/19/2022    Pt will score 55 or greater on FOTO in order  to demonstrate improved perception of function due to symptoms.  Baseline: 33 Goal status: INITIAL   2.  Pt will demonstrate at least 120 degrees of R knee flexion AROM in order to facilitate improved tolerance to functional movements such as bending/squatting.  Baseline: see ROM chart above Goal status: INITIAL   3.  Pt will be able to lift up to 10# with less than 2 pt increase in pain on NPS in order to demonstrate improved capacity for daily activities such as laundry/dishes.  Baseline: NT given difficulty with transfer as described above Goal status: INITIAL   4.  Pt will report/demonstrate ability to navigate up to 15 stairs safely in order to promote improved safety w/ home navigation.             Baseline: difficulty/pain with navigating stairs, requires rail            Goal status: INITIAL     PLAN:   PT FREQUENCY: 2x/week   PT DURATION: 8 weeks   PLANNED INTERVENTIONS: Therapeutic exercises, Therapeutic activity, Neuromuscular re-education, Balance training, Gait training, Patient/Family education, Self Care, Joint mobilization, Stair training, DME instructions, Aquatic Therapy, Dry Needling, Electrical stimulation, Cryotherapy, Moist heat, Taping, Manual therapy, and Re-evaluation   PLAN FOR NEXT SESSION:   review/update HEP. Continue SPC gait training. Strength/mobility as able/appropriate  Ashley Murrain PT, DPT 01/22/2022 11:13 AM

## 2022-01-23 NOTE — Therapy (Signed)
OUTPATIENT PHYSICAL THERAPY TREATMENT NOTE   Patient Name: Cheryl Prince MRN: [id number] DOB:06-Oct-1955, 66 y.o., female Today's Date: 01/24/2022  PCP: Elsie Stain, MD  REFERRING PROVIDER: Donella Stade, PA-C   END OF SESSION:   PT End of Session - 01/24/22 1043     Visit Number 9    Number of Visits 17    Date for PT Re-Evaluation 02/19/22    Authorization Type [employer] Medicare    Progress Note Due on Visit 10    PT Start Time 1045    PT Stop Time 1133    PT Time Calculation (min) 48 min    Activity Tolerance Patient tolerated treatment well    Behavior During Therapy WFL for tasks assessed/performed             Past Medical History:  Diagnosis Date   Allergy    Anemia    Anxiety    Arthritis    Asthma    borderline bronchitis   Bronchitis    GERD (gastroesophageal reflux disease)    Goiter, toxic diffuse 05/30/2016   Hypertension    S/P tonsillectomy 05/30/2016   Sleep apnea    history of, MD tookpt. off machine 7 yrs, ago   Past Surgical History:  Procedure Laterality Date   ABDOMINAL HYSTERECTOMY     adnoides     BREAST SURGERY     CHOLECYSTECTOMY     COLONOSCOPY     DILATION AND CURETTAGE OF UTERUS     GASTRIC BYPASS  1998   IUD REMOVAL     TONSILLECTOMY     TOTAL KNEE ARTHROPLASTY Left 09/22/2018   TOTAL KNEE ARTHROPLASTY Left 09/22/2018   Procedure: LEFT TOTAL KNEE ARTHROPLASTY;  Surgeon: Meredith Pel, MD;  Location: [place];  Service: Orthopedics;  Laterality: Left;   TOTAL KNEE ARTHROPLASTY Right 11/29/2021   Procedure: RIGHT TOTAL KNEE ARTHROPLASTY;  Surgeon: Meredith Pel, MD;  Location: [facility];  Service: Orthopedics;  Laterality: Right;   Patient Active Problem List   Diagnosis Date Noted   S/P total knee arthroplasty, right 12/26/2021   Deep venous thrombosis ([place]) 12/26/2021   Arthritis of knee 11/29/2021   Bipolar I disorder ([place]) 07/26/2020   Right hip pain 07/26/2020   Bronchitis 07/26/2020   Allergies  07/26/2020   GERD (gastroesophageal reflux disease) 07/26/2020   At risk for decreased bone density 07/26/2020   PTSD (post-traumatic stress disorder) 05/30/2016   Alpha thalassemia trait 05/30/2016   Hypertension 05/30/2016   Rotator cuff tendonitis, left 05/30/2016   Osteoarthritis of left knee 05/30/2016   Carpal tunnel syndrome 05/30/2016   Prediabetes 05/30/2016   Onychomycosis 05/30/2016    REFERRING DIAG: Z96.651 (ICD-10-CM) - Hx of total knee arthroplasty, right   THERAPY DIAG:  Right knee pain, unspecified chronicity  Stiffness of right knee, not elsewhere classified  Muscle weakness (generalized)  Other abnormalities of gait and mobility  Pain in right hip  Rationale for Evaluation and Treatment Rehabilitation  PERTINENT HISTORY: HTN, recent DVT on anticoagulant, GERD, TKA 11/29/21 on RLE, prior L TKA   PRECAUTIONS: Knee and Fall, recent DVT on anticoagulation   ONSET DATE: Oct 5th 2023   SUBJECTIVE:  SUBJECTIVE STATEMENT:    Patient reports minimal pain this morning and that she did some exercises this morning.    PAIN:  Are you having pain: 3/10 Location: Rt knee How would you describe your pain? Throbbing, sharp, pressure Aggravating factors: stair navigation, walking more than household distances,  Easing factors: medication, rest, ice    OBJECTIVE: (objective measures completed at initial evaluation unless otherwise dated)   PATIENT SURVEYS:  FOTO 33 01/14/22: 30%   COGNITION: Overall cognitive status: Within functional limits for tasks assessed                         SENSATION: Light touch intact B LE although pt reports subjective numbness along lateral portion of incision   EDEMA/INSPECTION:  Circumferential: 38cm L joint line, 39.6 cm R joint line  12cm distal  to joint line 38cm RLE, 38.5cm LLE Incision still has steri strips on distal half, although visible portion appears to be healing well     PALPATION: Palpable tightness quad/TFL and hamstrings on operative limb   LOWER EXTREMITY ROM:   Active ROM Right eval Left eval Right 01/14/22  Knee flexion 106 118 106  Knee extension -2 0   Ankle dorsiflexion       Ankle plantarflexion       Ankle inversion       Ankle eversion        (Blank rows = not tested)   LOWER EXTREMITY MMT:   MMT Right eval Left eval  Hip flexion      Hip extension      Hip abduction      Hip adduction      Hip internal rotation      Hip external rotation      Knee flexion   WFL  Knee extension   WFL  Ankle dorsiflexion      Ankle plantarflexion      Ankle inversion      Ankle eversion       (Blank rows = not tested) Comments: operative limb not tested given recency of DVT      FUNCTIONAL TESTS:  STS: standard chair, heavy UE support, weight shift to L, stooped posture prior to initiating upright on rollator   Will plan to test TUG vs 5xSTS next session 01/02/2022: TUG: 28 seconds 5xSTS: 28 seconds   GAIT: Distance walked: within clinic Assistive device utilized: rollator Level of assistance: Modified independence Comments: reduced stance on R, reduced knee ROM on R, reduced hip extension B, increased lateral weight shifting     TODAY'S TREATMENT:  [facility] Adult PT Treatment:                                                DATE: 01/24/22 Therapeutic Exercise: Recumbent bike full revolutions 5 min STS 5# KB 3x10, cues for form and pacing  Step ups 4" Rt LE leading 2x10 forward/lateral Standing hamstring curl 2.5# each LE, 2x10 cues for form and pacing Standing heel raises 2.5# 2x12 at counter Blue TKE 2x15 cues for reduced compensation at hip Gait Training: SPC gait training ~268f; CGA weaning to SBA. Education/cues for appropriate SPC use, weight shifting, upright posture Cone step overs  3 laps (5 cones), SPC, SBA, for improved clearance, cues for heel strike and posture, reduced frontal plane compensations Modalities: Cold pack anterior/posterior  knee, 10 min supine w/ bolster, tolerates well, no adverse events      [facility] Adult PT Treatment:                                                DATE: 01/22/22 Therapeutic Exercise: Recumbent bike half revolutions 5 min STS 5# KB 3x10, cues for form and pacing  Standing hamstring curl 2.5# each LE, 2x8 cues for form and pacing Standing heel raises 2.5# 2x12 at counter Blue TKE 2x15 cues for reduced compensation at hip Gait Training: SPC gait training ~258f; CGA weaning to SBA. Education/cues for appropriate SPC use, weight shifting, upright posture Cone step overs 3 laps (5 cones), SBA, for improved clearance, cues for heel strike and posture, reduced frontal plane compensations Modalities: Cold pack anterior/posterior knee, 10 min supine w/ bolster, tolerates well, no adverse events   [facility] Adult PT Treatment:                                                DATE: 01/16/22 Therapeutic Exercise: NuStep level 5 x 5 minutes UE/LE SLR 2 x 10  Hip bridge 2 x 10  Sit to stand 2 x 10; 5 lb kettlebell  Standing hip abduction 2 x 10 Standing hip extension 2 x 10  Standing HS curl 2 x 10  TKE blue band 2 x 10  LAQ 2 x 10; 2#   Modalities: Cold pack anterior/posterior knee, 10 min supine w/ bolster, tolerates well, no adverse events   PATIENT EDUCATION:  Education details: rationale for interventions, safety w/ AD use Person educated: Patient Education method: Explanation Education comprehension: verbalized understanding   HOME EXERCISE PROGRAM: Access Code: [email or web address]: [email or web address] Date: 01/10/2022 Prepared by: DEnis Slipper Exercises - Seated Long Arc Quad  - 1 x daily - 7 x weekly - 3 sets - 10 reps - Seated Knee Flexion AAROM  - 1 x daily - 7 x weekly - 3 sets - 10 reps - Small Range Straight  Leg Raise  - 1 x daily - 7 x weekly - 3 sets - 5 reps - Sit to Stand with Armchair  - 1 x daily - 7 x weekly - 2 sets - 8 reps   ASSESSMENT:   CLINICAL IMPRESSION: Patient presents to PT ambulating with SPC and reports minimal pain and she is able to start with full revolutions on the bike. Incorporated step ups today to good affect, no increase in pain and patient demonstrating good form. Patient was able to tolerate all prescribed exercises with no adverse effects. Patient continues to benefit from skilled PT services and should be progressed as able to improve functional independence.     OBJECTIVE IMPAIRMENTS: Abnormal gait, decreased activity tolerance, decreased balance, decreased endurance, decreased knowledge of use of DME, decreased mobility, difficulty walking, decreased ROM, decreased strength, hypomobility, increased edema, impaired flexibility, and pain.    ACTIVITY LIMITATIONS: carrying, lifting, bending, sitting, standing, squatting, stairs, transfers, bathing, toileting, and locomotion level   PARTICIPATION LIMITATIONS: meal prep, cleaning, laundry, driving, shopping, and community activity   PERSONAL FACTORS: Age and 1-2 comorbidities: HTN, DVT  are also affecting patient's functional outcome.    REHAB POTENTIAL: Good   CLINICAL DECISION MAKING:  Evolving/moderate complexity   EVALUATION COMPLEXITY: Moderate     GOALS: Goals reviewed with patient? No   SHORT TERM GOALS: Target date: 01/22/2022             Pt will demonstrate appropriate understanding and performance of initially prescribed HEP in order to facilitate improved independence with management of symptoms.  Baseline: HEP provided on eval Goal status: MET Pt reports adherence 01/24/22   2. Pt will score greater than or equal to 40 on FOTO in order to demonstrate improved perception of function due to symptoms.            Baseline: 33 01/14/22: 30%            Goal status: Ongoing   LONG TERM GOALS: Target  date: 02/19/2022    Pt will score 55 or greater on FOTO in order to demonstrate improved perception of function due to symptoms.  Baseline: 33 Goal status: INITIAL   2.  Pt will demonstrate at least 120 degrees of R knee flexion AROM in order to facilitate improved tolerance to functional movements such as bending/squatting.  Baseline: see ROM chart above Goal status: INITIAL   3.  Pt will be able to lift up to 10# with less than 2 pt increase in pain on NPS in order to demonstrate improved capacity for daily activities such as laundry/dishes.  Baseline: NT given difficulty with transfer as described above Goal status: INITIAL   4.  Pt will report/demonstrate ability to navigate up to 15 stairs safely in order to promote improved safety w/ home navigation.             Baseline: difficulty/pain with navigating stairs, requires rail            Goal status: INITIAL     PLAN:   PT FREQUENCY: 2x/week   PT DURATION: 8 weeks   PLANNED INTERVENTIONS: Therapeutic exercises, Therapeutic activity, Neuromuscular re-education, Balance training, Gait training, Patient/Family education, Self Care, Joint mobilization, Stair training, DME instructions, Aquatic Therapy, Dry Needling, Electrical stimulation, Cryotherapy, Moist heat, Taping, Manual therapy, and Re-evaluation   PLAN FOR NEXT SESSION:   review/update HEP. Continue SPC gait training. Strength/mobility as able/appropriate   Margarette Canada, PTA 01/24/22 11:24 AM

## 2022-01-24 ENCOUNTER — Ambulatory Visit: Payer: Medicare Other

## 2022-01-24 DIAGNOSIS — M25561 Pain in right knee: Secondary | ICD-10-CM | POA: Diagnosis not present

## 2022-01-24 DIAGNOSIS — M25661 Stiffness of right knee, not elsewhere classified: Secondary | ICD-10-CM

## 2022-01-24 DIAGNOSIS — M25551 Pain in right hip: Secondary | ICD-10-CM

## 2022-01-24 DIAGNOSIS — M6281 Muscle weakness (generalized): Secondary | ICD-10-CM

## 2022-01-24 DIAGNOSIS — R2689 Other abnormalities of gait and mobility: Secondary | ICD-10-CM

## 2022-01-29 ENCOUNTER — Ambulatory Visit: Payer: Medicare Other | Attending: Surgical

## 2022-01-29 DIAGNOSIS — R2689 Other abnormalities of gait and mobility: Secondary | ICD-10-CM | POA: Diagnosis present

## 2022-01-29 DIAGNOSIS — M25561 Pain in right knee: Secondary | ICD-10-CM | POA: Diagnosis not present

## 2022-01-29 DIAGNOSIS — M25661 Stiffness of right knee, not elsewhere classified: Secondary | ICD-10-CM | POA: Insufficient documentation

## 2022-01-29 DIAGNOSIS — M6281 Muscle weakness (generalized): Secondary | ICD-10-CM | POA: Diagnosis present

## 2022-01-29 NOTE — Therapy (Signed)
OUTPATIENT PHYSICAL THERAPY TREATMENT NOTE  Progress Note Reporting Period 12/25/21 to 01/29/22  See note below for Objective Data and Assessment of Progress/Goals.     Patient Name: Cheryl Prince MRN: [id number] DOB:[birth year], 66 y.o., female Today's Date: 01/29/2022  PCP: Elsie Stain, MD  REFERRING PROVIDER: Donella Stade, PA-C   END OF SESSION:   PT End of Session - 01/29/22 1059     Visit Number 10    Number of Visits 17    Date for PT Re-Evaluation 02/19/22    Authorization Type [employer] Medicare    Progress Note Due on Visit 20    PT Start Time 1100    PT Stop Time 1153    PT Time Calculation (min) 53 min    Activity Tolerance Patient tolerated treatment well    Behavior During Therapy WFL for tasks assessed/performed              Past Medical History:  Diagnosis Date   Allergy    Anemia    Anxiety    Arthritis    Asthma    borderline bronchitis   Bronchitis    GERD (gastroesophageal reflux disease)    Goiter, toxic diffuse 05/30/2016   Hypertension    S/P tonsillectomy 05/30/2016   Sleep apnea    history of, MD tookpt. off machine 7 yrs, ago   Past Surgical History:  Procedure Laterality Date   ABDOMINAL HYSTERECTOMY     adnoides     BREAST SURGERY     CHOLECYSTECTOMY     COLONOSCOPY     DILATION AND CURETTAGE OF UTERUS     GASTRIC BYPASS  1998   IUD REMOVAL     TONSILLECTOMY     TOTAL KNEE ARTHROPLASTY Left 09/22/2018   TOTAL KNEE ARTHROPLASTY Left 09/22/2018   Procedure: LEFT TOTAL KNEE ARTHROPLASTY;  Surgeon: Meredith Pel, MD;  Location: [place];  Service: Orthopedics;  Laterality: Left;   TOTAL KNEE ARTHROPLASTY Right 11/29/2021   Procedure: RIGHT TOTAL KNEE ARTHROPLASTY;  Surgeon: Meredith Pel, MD;  Location: [place];  Service: Orthopedics;  Laterality: Right;   Patient Active Problem List   Diagnosis Date Noted   S/P total knee arthroplasty, right 12/26/2021   Deep venous thrombosis ([place]) 12/26/2021   Arthritis  of knee 11/29/2021   Bipolar I disorder ([place]) 07/26/2020   Right hip pain 07/26/2020   Bronchitis 07/26/2020   Allergies 07/26/2020   GERD (gastroesophageal reflux disease) 07/26/2020   At risk for decreased bone density 07/26/2020   PTSD (post-traumatic stress disorder) 05/30/2016   Alpha thalassemia trait 05/30/2016   Hypertension 05/30/2016   Rotator cuff tendonitis, left 05/30/2016   Osteoarthritis of left knee 05/30/2016   Carpal tunnel syndrome 05/30/2016   Prediabetes 05/30/2016   Onychomycosis 05/30/2016    REFERRING DIAG: Z96.651 (ICD-10-CM) - Hx of total knee arthroplasty, right   THERAPY DIAG:  Right knee pain, unspecified chronicity  Stiffness of right knee, not elsewhere classified  Muscle weakness (generalized)  Other abnormalities of gait and mobility  Rationale for Evaluation and Treatment Rehabilitation  PERTINENT HISTORY: HTN, recent DVT on anticoagulant, GERD, TKA 11/29/21 on RLE, prior L TKA   PRECAUTIONS: Knee and Fall, recent DVT on anticoagulation   ONSET DATE: Oct 5th 2023   SUBJECTIVE:  SUBJECTIVE STATEMENT:    Patient reports the knee is feeling ok right now, but was hurting her in the middle of the night.     PAIN:  Are you having pain: 3/10 Location: Rt knee How would you describe your pain? Throbbing, sharp, pressure Aggravating factors: stair navigation, walking more than household distances,  Easing factors: medication, rest, ice    OBJECTIVE: (objective measures completed at initial evaluation unless otherwise dated)   PATIENT SURVEYS:  FOTO 33 01/14/22: 30% 01/29/22: 37% function    COGNITION: Overall cognitive status: Within functional limits for tasks assessed                         SENSATION: Light touch intact B LE although pt reports  subjective numbness along lateral portion of incision   EDEMA/INSPECTION:  Circumferential: 38cm L joint line, 39.6 cm R joint line  12cm distal to joint line 38cm RLE, 38.5cm LLE Incision still has steri strips on distal half, although visible portion appears to be healing well     PALPATION: Palpable tightness quad/TFL and hamstrings on operative limb   LOWER EXTREMITY ROM:   Active ROM Right eval Left eval Right 01/14/22 01/29/22 Right  Knee flexion 106 118 106 111  Knee extension -2 0    Ankle dorsiflexion        Ankle plantarflexion        Ankle inversion        Ankle eversion         (Blank rows = not tested)   LOWER EXTREMITY MMT:   MMT Right eval Left eval  Hip flexion      Hip extension      Hip abduction      Hip adduction      Hip internal rotation      Hip external rotation      Knee flexion   WFL  Knee extension   WFL  Ankle dorsiflexion      Ankle plantarflexion      Ankle inversion      Ankle eversion       (Blank rows = not tested) Comments: operative limb not tested given recency of DVT      FUNCTIONAL TESTS:  STS: standard chair, heavy UE support, weight shift to L, stooped posture prior to initiating upright on rollator   Will plan to test TUG vs 5xSTS next session 01/02/2022: TUG: 28 seconds 5xSTS: 28 seconds   GAIT: Distance walked: within clinic Assistive device utilized: rollator Level of assistance: Modified independence Comments: reduced stance on R, reduced knee ROM on R, reduced hip extension B, increased lateral weight shifting     TODAY'S TREATMENT:   [facility] Adult PT Treatment:                                                DATE: 01/29/22 Therapeutic Exercise: Recumbent bike x 5 minutes level 2 TKE black band 2 x 10  Sit to stand 2 x 10; 7 # Lateral band walks at counter green band at shins 3 sets d/b  Standing hip extension 2 x 10;2# Standing hip abduction 2 x 10; 2# March 2 x 10; 2#    Therapeutic  Activity: Re-assessment to determine overall progress, educating patient on progress towards goals Stair negotiation with SPC   Modalities: Cold pack  anterior/posterior knee, 10 min supine w/ bolster  [facility] Adult PT Treatment:                                                DATE: 01/24/22 Therapeutic Exercise: Recumbent bike full revolutions 5 min STS 5# KB 3x10, cues for form and pacing  Step ups 4" Rt LE leading 2x10 forward/lateral Standing hamstring curl 2.5# each LE, 2x10 cues for form and pacing Standing heel raises 2.5# 2x12 at counter Blue TKE 2x15 cues for reduced compensation at hip Gait Training: SPC gait training ~227f; CGA weaning to SBA. Education/cues for appropriate SPC use, weight shifting, upright posture Cone step overs 3 laps (5 cones), SPC, SBA, for improved clearance, cues for heel strike and posture, reduced frontal plane compensations Modalities: Cold pack anterior/posterior knee, 10 min supine w/ bolster, tolerates well, no adverse events      [facility] Adult PT Treatment:                                                DATE: 01/22/22 Therapeutic Exercise: Recumbent bike half revolutions 5 min STS 5# KB 3x10, cues for form and pacing  Standing hamstring curl 2.5# each LE, 2x8 cues for form and pacing Standing heel raises 2.5# 2x12 at counter Blue TKE 2x15 cues for reduced compensation at hip Gait Training: SPC gait training ~209f CGA weaning to SBA. Education/cues for appropriate SPC use, weight shifting, upright posture Cone step overs 3 laps (5 cones), SBA, for improved clearance, cues for heel strike and posture, reduced frontal plane compensations Modalities: Cold pack anterior/posterior knee, 10 min supine w/ bolster, tolerates well, no adverse events   PATIENT EDUCATION:  Education details: progress towards goals  Person educated: Patient Education method: Explanation Education comprehension: verbalized understanding   HOME EXERCISE PROGRAM: Access  Code: [email or web address]: [email or web address] Date: 01/10/2022 Prepared by: DaEnis SlipperExercises - Seated Long Arc Quad  - 1 x daily - 7 x weekly - 3 sets - 10 reps - Seated Knee Flexion AAROM  - 1 x daily - 7 x weekly - 3 sets - 10 reps - Small Range Straight Leg Raise  - 1 x daily - 7 x weekly - 3 sets - 5 reps - Sit to Stand with Armchair  - 1 x daily - 7 x weekly - 2 sets - 8 reps   ASSESSMENT:   CLINICAL IMPRESSION: Patient is making gradual functional progress s/p Rt TKA on 11/29/21. She has met 1/2 short term functional goals and 1/4 long term functional goals. She demonstrates improvements in knee ROM, strength, and gait stability. She will benefit from continuing with current POC to further progress her closed chain strengthening and address lingering knee ROM deficits in order to optimize her function.     OBJECTIVE IMPAIRMENTS: Abnormal gait, decreased activity tolerance, decreased balance, decreased endurance, decreased knowledge of use of DME, decreased mobility, difficulty walking, decreased ROM, decreased strength, hypomobility, increased edema, impaired flexibility, and pain.    ACTIVITY LIMITATIONS: carrying, lifting, bending, sitting, standing, squatting, stairs, transfers, bathing, toileting, and locomotion level   PARTICIPATION LIMITATIONS: meal prep, cleaning, laundry, driving, shopping, and community activity   PERSONAL FACTORS: Age and 1-2 comorbidities: HTN, DVT  are  also affecting patient's functional outcome.    REHAB POTENTIAL: Good   CLINICAL DECISION MAKING: Evolving/moderate complexity   EVALUATION COMPLEXITY: Moderate     GOALS: Goals reviewed with patient? No   SHORT TERM GOALS: Target date: 01/22/2022             Pt will demonstrate appropriate understanding and performance of initially prescribed HEP in order to facilitate improved independence with management of symptoms.  Baseline: HEP provided on eval Goal status: MET Pt reports  adherence 01/24/22   2. Pt will score greater than or equal to 40 on FOTO in order to demonstrate improved perception of function due to symptoms.            Baseline: 33 01/14/22: 30%            Goal status: Ongoing   LONG TERM GOALS: Target date: 02/19/2022    Pt will score 55 or greater on FOTO in order to demonstrate improved perception of function due to symptoms.  Baseline: 33 Goal status: ongoing    2.  Pt will demonstrate at least 120 degrees of R knee flexion AROM in order to facilitate improved tolerance to functional movements such as bending/squatting.  Baseline: see ROM chart above Goal status: ongoing    3.  Pt will be able to lift up to 10# with less than 2 pt increase in pain on NPS in order to demonstrate improved capacity for daily activities such as laundry/dishes.  Baseline: NT given difficulty with transfer as described above 01/29/22: progressing lifting as able.  Goal status: ongoing    4.  Pt will report/demonstrate ability to navigate up to 15 stairs safely in order to promote improved safety w/ home navigation.             Baseline: difficulty/pain with navigating stairs, requires rail  Status 01/29/22: utilizes [facility] for stair navigation.             Goal status: met     PLAN:   PT FREQUENCY: 2x/week   PT DURATION: 8 weeks   PLANNED INTERVENTIONS: Therapeutic exercises, Therapeutic activity, Neuromuscular re-education, Balance training, Gait training, Patient/Family education, Self Care, Joint mobilization, Stair training, DME instructions, Aquatic Therapy, Dry Needling, Electrical stimulation, Cryotherapy, Moist heat, Taping, Manual therapy, and Re-evaluation   PLAN FOR NEXT SESSION:   review/update HEP. Continue SPC gait training. Strength/mobility as able/appropriate   Gwendolyn Grant, PT, DPT, ATC 01/29/22 1:50 PM

## 2022-01-31 ENCOUNTER — Ambulatory Visit: Payer: Medicare Other | Admitting: Physical Therapy

## 2022-01-31 ENCOUNTER — Encounter: Payer: Self-pay | Admitting: Physical Therapy

## 2022-01-31 DIAGNOSIS — M6281 Muscle weakness (generalized): Secondary | ICD-10-CM

## 2022-01-31 DIAGNOSIS — M25661 Stiffness of right knee, not elsewhere classified: Secondary | ICD-10-CM

## 2022-01-31 DIAGNOSIS — M25561 Pain in right knee: Secondary | ICD-10-CM | POA: Diagnosis not present

## 2022-01-31 DIAGNOSIS — R2689 Other abnormalities of gait and mobility: Secondary | ICD-10-CM

## 2022-01-31 NOTE — Therapy (Signed)
OUTPATIENT PHYSICAL THERAPY TREATMENT NOTE  Patient Name: Cheryl Prince MRN: [id number] DOB:1955-05-01, 66 y.o., female Today's Date: 01/31/2022  PCP: Elsie Stain, MD  REFERRING PROVIDER: Donella Stade, PA-C   END OF SESSION:   PT End of Session - 01/31/22 1053     Visit Number 11    Number of Visits 17    Date for PT Re-Evaluation 02/19/22    Authorization Type [employer] Medicare    Progress Note Due on Visit 3    PT Start Time 1054    PT Stop Time 1144   time not billed for ice pack   PT Time Calculation (min) 50 min    Activity Tolerance Patient tolerated treatment well    Behavior During Therapy WFL for tasks assessed/performed               Past Medical History:  Diagnosis Date   Allergy    Anemia    Anxiety    Arthritis    Asthma    borderline bronchitis   Bronchitis    GERD (gastroesophageal reflux disease)    Goiter, toxic diffuse 05/30/2016   Hypertension    S/P tonsillectomy 05/30/2016   Sleep apnea    history of, MD tookpt. off machine 7 yrs, ago   Past Surgical History:  Procedure Laterality Date   ABDOMINAL HYSTERECTOMY     adnoides     BREAST SURGERY     CHOLECYSTECTOMY     COLONOSCOPY     DILATION AND CURETTAGE OF UTERUS     GASTRIC BYPASS  1998   IUD REMOVAL     TONSILLECTOMY     TOTAL KNEE ARTHROPLASTY Left 09/22/2018   TOTAL KNEE ARTHROPLASTY Left 09/22/2018   Procedure: LEFT TOTAL KNEE ARTHROPLASTY;  Surgeon: Meredith Pel, MD;  Location: [place];  Service: Orthopedics;  Laterality: Left;   TOTAL KNEE ARTHROPLASTY Right 11/29/2021   Procedure: RIGHT TOTAL KNEE ARTHROPLASTY;  Surgeon: Meredith Pel, MD;  Location: [place];  Service: Orthopedics;  Laterality: Right;   Patient Active Problem List   Diagnosis Date Noted   S/P total knee arthroplasty, right 12/26/2021   Deep venous thrombosis ([place]) 12/26/2021   Arthritis of knee 11/29/2021   Bipolar I disorder ([place]) 07/26/2020   Right hip pain 07/26/2020    Bronchitis 07/26/2020   Allergies 07/26/2020   GERD (gastroesophageal reflux disease) 07/26/2020   At risk for decreased bone density 07/26/2020   PTSD (post-traumatic stress disorder) 05/30/2016   Alpha thalassemia trait 05/30/2016   Hypertension 05/30/2016   Rotator cuff tendonitis, left 05/30/2016   Osteoarthritis of left knee 05/30/2016   Carpal tunnel syndrome 05/30/2016   Prediabetes 05/30/2016   Onychomycosis 05/30/2016    REFERRING DIAG: Z96.651 (ICD-10-CM) - Hx of total knee arthroplasty, right   THERAPY DIAG:  Right knee pain, unspecified chronicity  Stiffness of right knee, not elsewhere classified  Muscle weakness (generalized)  Other abnormalities of gait and mobility  Rationale for Evaluation and Treatment Rehabilitation  PERTINENT HISTORY: HTN, recent DVT on anticoagulant, GERD, TKA 11/29/21 on RLE, prior L TKA   PRECAUTIONS: Knee and Fall, recent DVT on anticoagulation   ONSET DATE: Oct 5th 2023   SUBJECTIVE:  SUBJECTIVE STATEMENT:    Pt reports minimal pain at present, has transitioned fully to cane use in community. No other new updates, reports continuing to do well overall    PAIN:  Are you having pain: 2/10 Location: Rt knee How would you describe your pain? Throbbing, sharp, pressure Aggravating factors: stair navigation, walking more than household distances,  Easing factors: medication, rest, ice    OBJECTIVE: (objective measures completed at initial evaluation unless otherwise dated)   PATIENT SURVEYS:  FOTO 33 01/14/22: 30% 01/29/22: 37% function    COGNITION: Overall cognitive status: Within functional limits for tasks assessed                         SENSATION: Light touch intact B LE although pt reports subjective numbness along lateral portion of  incision   EDEMA/INSPECTION:  Circumferential: 38cm L joint line, 39.6 cm R joint line  12cm distal to joint line 38cm RLE, 38.5cm LLE Incision still has steri strips on distal half, although visible portion appears to be healing well     PALPATION: Palpable tightness quad/TFL and hamstrings on operative limb   LOWER EXTREMITY ROM:   Active ROM Right eval Left eval Right 01/14/22 01/29/22 Right  Knee flexion 106 118 106 111  Knee extension -2 0    Ankle dorsiflexion        Ankle plantarflexion        Ankle inversion        Ankle eversion         (Blank rows = not tested)   LOWER EXTREMITY MMT:   MMT Right eval Left eval  Hip flexion      Hip extension      Hip abduction      Hip adduction      Hip internal rotation      Hip external rotation      Knee flexion   WFL  Knee extension   WFL  Ankle dorsiflexion      Ankle plantarflexion      Ankle inversion      Ankle eversion       (Blank rows = not tested) Comments: operative limb not tested given recency of DVT      FUNCTIONAL TESTS:  STS: standard chair, heavy UE support, weight shift to L, stooped posture prior to initiating upright on rollator   Will plan to test TUG vs 5xSTS next session 01/02/2022: TUG: 28 seconds 5xSTS: 28 seconds   GAIT: Distance walked: within clinic Assistive device utilized: rollator Level of assistance: Modified independence Comments: reduced stance on R, reduced knee ROM on R, reduced hip extension B, increased lateral weight shifting     TODAY'S TREATMENT:  [facility] Adult PT Treatment:                                                DATE: 01/31/22 Therapeutic Exercise: Nu step LE/UE 51mn during subjective 2inch step up with blue band TKE 2x12 RLE 4inch lateral step up 2x8 RLE, cues for reduced compensations at hip 7# STS x15 from lowest mat, cues for pacing 2x12 heel raises off of 2inch step LAQ 2# x20 seated SLR x15 cues for control Bridge x15 cues for hip  squeeze  Modalities: Cold pack anterior/posterior knee, 10 min supine w/ bolster, no adverse events, tolerates well; time  not billed   [facility] Adult PT Treatment:                                                DATE: 01/29/22 Therapeutic Exercise: Recumbent bike x 5 minutes level 2 TKE black band 2 x 10  Sit to stand 2 x 10; 7 # Lateral band walks at counter green band at shins 3 sets d/b  Standing hip extension 2 x 10;2# Standing hip abduction 2 x 10; 2# March 2 x 10; 2#    Therapeutic Activity: Re-assessment to determine overall progress, educating patient on progress towards goals Stair negotiation with SPC   Modalities: Cold pack anterior/posterior knee, 10 min supine w/ bolster  [facility] Adult PT Treatment:                                                DATE: 01/24/22 Therapeutic Exercise: Recumbent bike full revolutions 5 min STS 5# KB 3x10, cues for form and pacing  Step ups 4" Rt LE leading 2x10 forward/lateral Standing hamstring curl 2.5# each LE, 2x10 cues for form and pacing Standing heel raises 2.5# 2x12 at counter Blue TKE 2x15 cues for reduced compensation at hip Gait Training: SPC gait training ~245f; CGA weaning to SBA. Education/cues for appropriate SPC use, weight shifting, upright posture Cone step overs 3 laps (5 cones), SPC, SBA, for improved clearance, cues for heel strike and posture, reduced frontal plane compensations Modalities: Cold pack anterior/posterior knee, 10 min supine w/ bolster, tolerates well, no adverse events         PATIENT EDUCATION:  Education details: rationale for interventions Person educated: Patient Education method: Explanation Education comprehension: verbalized understanding   HOME EXERCISE PROGRAM: Access Code: [email or web address]: [email or web address] Date: 01/10/2022 Prepared by: DEnis Slipper Exercises - Seated Long Arc Quad  - 1 x daily - 7 x weekly - 3 sets - 10 reps - Seated Knee Flexion AAROM  - 1 x  daily - 7 x weekly - 3 sets - 10 reps - Small Range Straight Leg Raise  - 1 x daily - 7 x weekly - 3 sets - 5 reps - Sit to Stand with Armchair  - 1 x daily - 7 x weekly - 2 sets - 8 reps   ASSESSMENT:   CLINICAL IMPRESSION: Pt arrives with 2/10 pain, continues to report gradual progress overall. Today's session emphasizing quad strength/endurance in open and closed chain. Pt tolerates progression well without increase in pain although continues to demonstrate gait impairments and quad weakness as expected. Pt reports excellent response to ice at end of session, no adverse events throughout. Pt departs today's session in no acute distress, all voiced questions/concerns addressed appropriately from PT perspective.      OBJECTIVE IMPAIRMENTS: Abnormal gait, decreased activity tolerance, decreased balance, decreased endurance, decreased knowledge of use of DME, decreased mobility, difficulty walking, decreased ROM, decreased strength, hypomobility, increased edema, impaired flexibility, and pain.    ACTIVITY LIMITATIONS: carrying, lifting, bending, sitting, standing, squatting, stairs, transfers, bathing, toileting, and locomotion level   PARTICIPATION LIMITATIONS: meal prep, cleaning, laundry, driving, shopping, and community activity   PERSONAL FACTORS: Age and 1-2 comorbidities: HTN, DVT  are also affecting patient's functional outcome.  REHAB POTENTIAL: Good   CLINICAL DECISION MAKING: Evolving/moderate complexity   EVALUATION COMPLEXITY: Moderate     GOALS: Goals reviewed with patient? No   SHORT TERM GOALS: Target date: 01/22/2022             Pt will demonstrate appropriate understanding and performance of initially prescribed HEP in order to facilitate improved independence with management of symptoms.  Baseline: HEP provided on eval Goal status: MET Pt reports adherence 01/24/22   2. Pt will score greater than or equal to 40 on FOTO in order to demonstrate improved perception  of function due to symptoms.            Baseline: 33 01/14/22: 30%            Goal status: Ongoing   LONG TERM GOALS: Target date: 02/19/2022    Pt will score 55 or greater on FOTO in order to demonstrate improved perception of function due to symptoms.  Baseline: 33 Goal status: ongoing    2.  Pt will demonstrate at least 120 degrees of R knee flexion AROM in order to facilitate improved tolerance to functional movements such as bending/squatting.  Baseline: see ROM chart above Goal status: ongoing    3.  Pt will be able to lift up to 10# with less than 2 pt increase in pain on NPS in order to demonstrate improved capacity for daily activities such as laundry/dishes.  Baseline: NT given difficulty with transfer as described above 01/29/22: progressing lifting as able.  Goal status: ongoing    4.  Pt will report/demonstrate ability to navigate up to 15 stairs safely in order to promote improved safety w/ home navigation.             Baseline: difficulty/pain with navigating stairs, requires rail  Status 01/29/22: utilizes [facility] for stair navigation.             Goal status: met     PLAN:   PT FREQUENCY: 2x/week   PT DURATION: 8 weeks   PLANNED INTERVENTIONS: Therapeutic exercises, Therapeutic activity, Neuromuscular re-education, Balance training, Gait training, Patient/Family education, Self Care, Joint mobilization, Stair training, DME instructions, Aquatic Therapy, Dry Needling, Electrical stimulation, Cryotherapy, Moist heat, Taping, Manual therapy, and Re-evaluation   PLAN FOR NEXT SESSION:   review/update HEP. Continue gait training w/ or w/o SPC. Strength/mobility as able/appropriate   Leeroy Cha PT, DPT 01/31/2022 12:47 PM

## 2022-02-05 ENCOUNTER — Telehealth: Payer: Self-pay | Admitting: Orthopedic Surgery

## 2022-02-05 ENCOUNTER — Ambulatory Visit: Payer: Medicare Other | Admitting: Critical Care Medicine

## 2022-02-05 ENCOUNTER — Ambulatory Visit: Payer: Medicare Other

## 2022-02-05 DIAGNOSIS — M25561 Pain in right knee: Secondary | ICD-10-CM

## 2022-02-05 DIAGNOSIS — M25661 Stiffness of right knee, not elsewhere classified: Secondary | ICD-10-CM

## 2022-02-05 DIAGNOSIS — M6281 Muscle weakness (generalized): Secondary | ICD-10-CM

## 2022-02-05 DIAGNOSIS — R2689 Other abnormalities of gait and mobility: Secondary | ICD-10-CM

## 2022-02-05 NOTE — Telephone Encounter (Signed)
Pt called in to see if it was ok for her to drive... Pt stated she feel like she is ok to drive... Pt requesting callback.Marland KitchenMarland Kitchen

## 2022-02-05 NOTE — Therapy (Signed)
OUTPATIENT PHYSICAL THERAPY TREATMENT NOTE  Patient Name: Cheryl Prince MRN: [id number] DOB:[birth year], 66 y.o., female Today's Date: 02/05/2022  PCP: Elsie Stain, MD  REFERRING PROVIDER: Donella Stade, PA-C   END OF SESSION:   PT End of Session - 02/05/22 1100     Visit Number 12    Number of Visits 17    Date for PT Re-Evaluation 02/19/22    Authorization Type [employer] Medicare    Progress Note Due on Visit 20    PT Start Time 1100    PT Stop Time 1152    PT Time Calculation (min) 52 min    Activity Tolerance Patient tolerated treatment well    Behavior During Therapy WFL for tasks assessed/performed                Past Medical History:  Diagnosis Date   Allergy    Anemia    Anxiety    Arthritis    Asthma    borderline bronchitis   Bronchitis    GERD (gastroesophageal reflux disease)    Goiter, toxic diffuse 05/30/2016   Hypertension    S/P tonsillectomy 05/30/2016   Sleep apnea    history of, MD tookpt. off machine 7 yrs, ago   Past Surgical History:  Procedure Laterality Date   ABDOMINAL HYSTERECTOMY     adnoides     BREAST SURGERY     CHOLECYSTECTOMY     COLONOSCOPY     DILATION AND CURETTAGE OF UTERUS     GASTRIC BYPASS  1998   IUD REMOVAL     TONSILLECTOMY     TOTAL KNEE ARTHROPLASTY Left 09/22/2018   TOTAL KNEE ARTHROPLASTY Left 09/22/2018   Procedure: LEFT TOTAL KNEE ARTHROPLASTY;  Surgeon: Meredith Pel, MD;  Location: [place];  Service: Orthopedics;  Laterality: Left;   TOTAL KNEE ARTHROPLASTY Right 11/29/2021   Procedure: RIGHT TOTAL KNEE ARTHROPLASTY;  Surgeon: Meredith Pel, MD;  Location: [place];  Service: Orthopedics;  Laterality: Right;   Patient Active Problem List   Diagnosis Date Noted   S/P total knee arthroplasty, right 12/26/2021   Deep venous thrombosis ([place]) 12/26/2021   Arthritis of knee 11/29/2021   Bipolar I disorder ([place]) 07/26/2020   Right hip pain 07/26/2020   Bronchitis 07/26/2020   Allergies  07/26/2020   GERD (gastroesophageal reflux disease) 07/26/2020   At risk for decreased bone density 07/26/2020   PTSD (post-traumatic stress disorder) 05/30/2016   Alpha thalassemia trait 05/30/2016   Hypertension 05/30/2016   Rotator cuff tendonitis, left 05/30/2016   Osteoarthritis of left knee 05/30/2016   Carpal tunnel syndrome 05/30/2016   Prediabetes 05/30/2016   Onychomycosis 05/30/2016    REFERRING DIAG: Z96.651 (ICD-10-CM) - Hx of total knee arthroplasty, right   THERAPY DIAG:  Right knee pain, unspecified chronicity  Stiffness of right knee, not elsewhere classified  Muscle weakness (generalized)  Other abnormalities of gait and mobility  Rationale for Evaluation and Treatment Rehabilitation  PERTINENT HISTORY: HTN, recent DVT on anticoagulant, GERD, TKA 11/29/21 on RLE, prior L TKA   PRECAUTIONS: Knee and Fall, recent DVT on anticoagulation   ONSET DATE: Oct 5th 2023   SUBJECTIVE:  SUBJECTIVE STATEMENT:    "I am doing good."     PAIN:  Are you having pain: 1/10 Location: Rt knee How would you describe your pain? sharp  Aggravating factors: occasionally with movement  Easing factors: medication, rest, ice    OBJECTIVE: (objective measures completed at initial evaluation unless otherwise dated)   PATIENT SURVEYS:  FOTO 33 01/14/22: 30% 01/29/22: 37% function    COGNITION: Overall cognitive status: Within functional limits for tasks assessed                         SENSATION: Light touch intact B LE although pt reports subjective numbness along lateral portion of incision   EDEMA/INSPECTION:  Circumferential: 38cm L joint line, 39.6 cm R joint line  12cm distal to joint line 38cm RLE, 38.5cm LLE Incision still has steri strips on distal half, although visible portion  appears to be healing well     PALPATION: Palpable tightness quad/TFL and hamstrings on operative limb   LOWER EXTREMITY ROM:   Active ROM Right eval Left eval Right 01/14/22 01/29/22 Right 02/05/22 Right   Knee flexion 106 118 106 111 115  Knee extension -2 0     Ankle dorsiflexion         Ankle plantarflexion         Ankle inversion         Ankle eversion          (Blank rows = not tested)   LOWER EXTREMITY MMT:   MMT Right eval Left eval  Hip flexion      Hip extension      Hip abduction      Hip adduction      Hip internal rotation      Hip external rotation      Knee flexion   WFL  Knee extension   WFL  Ankle dorsiflexion      Ankle plantarflexion      Ankle inversion      Ankle eversion       (Blank rows = not tested) Comments: operative limb not tested given recency of DVT      FUNCTIONAL TESTS:  STS: standard chair, heavy UE support, weight shift to L, stooped posture prior to initiating upright on rollator   Will plan to test TUG vs 5xSTS next session 01/02/2022: TUG: 28 seconds 5xSTS: 28 seconds   GAIT: Distance walked: within clinic Assistive device utilized: rollator Level of assistance: Modified independence Comments: reduced stance on R, reduced knee ROM on R, reduced hip extension B, increased lateral weight shifting     TODAY'S TREATMENT:  [facility] Adult PT Treatment:                                                DATE: 02/05/22 Therapeutic Exercise: NuStep level 5 x 5 minutes  Resisted knee extension 3 x 10 @ 10 lbs  HS curl 3 x 10 @ 20 lbs  Bodyweight squats with UE support 2 x 10  Resisted hip abduction green band 2 x 10  Resisted hip extension green band 2 x 10  Leg press 3 x 10 @ 20 lbs  Sidelying hip abduction 2 x 10  Updated HEP  Modalities: Cold pack anterior/posterior knee, 10 min supine w/ bolster  [facility] Adult PT Treatment:  DATE: 01/31/22 Therapeutic Exercise: Nu step LE/UE 5mn  during subjective 2inch step up with blue band TKE 2x12 RLE 4inch lateral step up 2x8 RLE, cues for reduced compensations at hip 7# STS x15 from lowest mat, cues for pacing 2x12 heel raises off of 2inch step LAQ 2# x20 seated SLR x15 cues for control Bridge x15 cues for hip squeeze  Modalities: Cold pack anterior/posterior knee, 10 min supine w/ bolster, no adverse events, tolerates well; time not billed   [facility]                                                [facility]: 01/29/22 Therapeutic Exercise: Recumbent bike x 5 minutes level 2 TKE black band 2 x 10  Sit to stand 2 x 10; 7 # Lateral band walks at counter green band at shins 3 sets d/b  Standing hip extension 2 x 10;2# Standing hip abduction 2 x 10; 2# March 2 x 10; 2#    Therapeutic Activity: Re-assessment to determine overall progress, educating patient on progress towards goals Stair negotiation with SPC   Modalities: Cold pack anterior/posterior knee, 10 min supine w/ bolster     PATIENT EDUCATION:  Education details: HEP Person educated: Patient Education method: EConsulting civil engineer demo, cues, handout Education comprehension: verbalized understanding, returned demo, cues    HOME EXERCISE PROGRAM: Access Code: [email or web address]: [email or web address] Date: 01/10/2022 Prepared by: DEnis Slipper Exercises - Seated Long Arc Quad  - 1 x daily - 7 x weekly - 3 sets - 10 reps - Seated Knee Flexion AAROM  - 1 x daily - 7 x weekly - 3 sets - 10 reps - Small Range Straight Leg Raise  - 1 x daily - 7 x weekly - 3 sets - 5 reps - Sit to Stand with Armchair  - 1 x daily - 7 x weekly - 2 sets - 8 reps   ASSESSMENT:   CLINICAL IMPRESSION: Patient arrives with minimal pain about the knee. Knee flexion AROM continues to gradually improve, achieving 115 degrees today. Added in machine strengthening today without reports of increased pain. She was encouraged to utilize machines at the gym to progress her  knee strength as well as HEP was updated to include further strengthening.     OBJECTIVE IMPAIRMENTS: Abnormal gait, decreased activity tolerance, decreased balance, decreased endurance, decreased knowledge of use of DME, decreased mobility, difficulty walking, decreased ROM, decreased strength, hypomobility, increased edema, impaired flexibility, and pain.    ACTIVITY LIMITATIONS: carrying, lifting, bending, sitting, standing, squatting, stairs, transfers, bathing, toileting, and locomotion level   PARTICIPATION LIMITATIONS: meal prep, cleaning, laundry, driving, shopping, and community activity   PERSONAL FACTORS: Age and 1-2 comorbidities: HTN, DVT  are also affecting patient's functional outcome.    REHAB POTENTIAL: Good   CLINICAL DECISION MAKING: Evolving/moderate complexity   EVALUATION COMPLEXITY: Moderate     GOALS: Goals reviewed with patient? No   SHORT TERM GOALS: Target date: 01/22/2022             Pt will demonstrate appropriate understanding and performance of initially prescribed HEP in order to facilitate improved independence with management of symptoms.  Baseline: HEP provided on eval Goal status: MET Pt reports adherence 01/24/22   2. Pt will score greater than or equal to 40 on FOTO in order to demonstrate improved perception of function  due to symptoms.            Baseline: 33 01/14/22: 30%            Goal status: Ongoing   LONG TERM GOALS: Target date: 02/19/2022    Pt will score 55 or greater on FOTO in order to demonstrate improved perception of function due to symptoms.  Baseline: 33 Goal status: ongoing    2.  Pt will demonstrate at least 120 degrees of R knee flexion AROM in order to facilitate improved tolerance to functional movements such as bending/squatting.  Baseline: see ROM chart above Goal status: ongoing    3.  Pt will be able to lift up to 10# with less than 2 pt increase in pain on NPS in order to demonstrate improved capacity for  daily activities such as laundry/dishes.  Baseline: NT given difficulty with transfer as described above 01/29/22: progressing lifting as able.  Goal status: ongoing    4.  Pt will report/demonstrate ability to navigate up to 15 stairs safely in order to promote improved safety w/ home navigation.             Baseline: difficulty/pain with navigating stairs, requires rail  Status 01/29/22: utilizes [facility] for stair navigation.             Goal status: met     PLAN:   PT FREQUENCY: 2x/week   PT DURATION: 8 weeks   PLANNED INTERVENTIONS: Therapeutic exercises, Therapeutic activity, Neuromuscular re-education, Balance training, Gait training, Patient/Family education, Self Care, Joint mobilization, Stair training, DME instructions, Aquatic Therapy, Dry Needling, Electrical stimulation, Cryotherapy, Moist heat, Taping, Manual therapy, and Re-evaluation   PLAN FOR NEXT SESSION:   review/update HEP. Progress closed chain strengthening.   Gwendolyn Grant, PT, DPT, ATC 02/05/22 11:42 AM

## 2022-02-05 NOTE — Telephone Encounter (Signed)
Should be okay with how far out she is from surgery and how well she was doing at her last appointment. I would start with no highway driving for a few weeks though and make sure she can slam on the brakes without a second thought of pain in her operative knee before she resumes driving any significant distance.  Definitely dont take opioid medication while driving

## 2022-02-06 NOTE — Telephone Encounter (Signed)
IC advised.  

## 2022-02-06 NOTE — Therapy (Signed)
OUTPATIENT PHYSICAL THERAPY TREATMENT NOTE  Patient Name: Cheryl Prince MRN: [id number] DOB:1955/05/30, 66 y.o., female Today's Date: 02/07/2022  PCP: Elsie Stain, MD  REFERRING PROVIDER: Donella Stade, PA-C   END OF SESSION:   PT End of Session - 02/07/22 1100     Visit Number 13    Number of Visits 17    Date for PT Re-Evaluation 02/19/22    Authorization Type [employer] Medicare    Progress Note Due on Visit 20    PT Start Time 1100    PT Stop Time 1149    PT Time Calculation (min) 49 min    Activity Tolerance Patient tolerated treatment well;No increased pain    Behavior During Therapy WFL for tasks assessed/performed                 Past Medical History:  Diagnosis Date   Allergy    Anemia    Anxiety    Arthritis    Asthma    borderline bronchitis   Bronchitis    GERD (gastroesophageal reflux disease)    Goiter, toxic diffuse 05/30/2016   Hypertension    S/P tonsillectomy 05/30/2016   Sleep apnea    history of, MD tookpt. off machine 7 yrs, ago   Past Surgical History:  Procedure Laterality Date   ABDOMINAL HYSTERECTOMY     adnoides     BREAST SURGERY     CHOLECYSTECTOMY     COLONOSCOPY     DILATION AND CURETTAGE OF UTERUS     GASTRIC BYPASS  1998   IUD REMOVAL     TONSILLECTOMY     TOTAL KNEE ARTHROPLASTY Left 09/22/2018   TOTAL KNEE ARTHROPLASTY Left 09/22/2018   Procedure: LEFT TOTAL KNEE ARTHROPLASTY;  Surgeon: Meredith Pel, MD;  Location: [place];  Service: Orthopedics;  Laterality: Left;   TOTAL KNEE ARTHROPLASTY Right 11/29/2021   Procedure: RIGHT TOTAL KNEE ARTHROPLASTY;  Surgeon: Meredith Pel, MD;  Location: [place];  Service: Orthopedics;  Laterality: Right;   Patient Active Problem List   Diagnosis Date Noted   S/P total knee arthroplasty, right 12/26/2021   Deep venous thrombosis ([place]) 12/26/2021   Arthritis of knee 11/29/2021   Bipolar I disorder ([place]) 07/26/2020   Right hip pain 07/26/2020   Bronchitis  07/26/2020   Allergies 07/26/2020   GERD (gastroesophageal reflux disease) 07/26/2020   At risk for decreased bone density 07/26/2020   PTSD (post-traumatic stress disorder) 05/30/2016   Alpha thalassemia trait 05/30/2016   Hypertension 05/30/2016   Rotator cuff tendonitis, left 05/30/2016   Osteoarthritis of left knee 05/30/2016   Carpal tunnel syndrome 05/30/2016   Prediabetes 05/30/2016   Onychomycosis 05/30/2016    REFERRING DIAG: Z96.651 (ICD-10-CM) - Hx of total knee arthroplasty, right   THERAPY DIAG:  Right knee pain, unspecified chronicity  Stiffness of right knee, not elsewhere classified  Muscle weakness (generalized)  Other abnormalities of gait and mobility  Rationale for Evaluation and Treatment Rehabilitation  PERTINENT HISTORY: HTN, recent DVT on anticoagulant, GERD, TKA 11/29/21 on RLE, prior L TKA   PRECAUTIONS: Knee and Fall, recent DVT on anticoagulation   ONSET DATE: Oct 5th 2023   SUBJECTIVE:  SUBJECTIVE STATEMENT:    Pt arrives with little to no pain today although she notes significant increase in pain yesterday doing community activities without cane. Was back to baseline yesterday evening after ice/tylenol. No other new updates    PAIN:  Are you having pain: 1/10 Location: Rt knee How would you describe your pain? sharp  Aggravating factors: occasionally with movement  Easing factors: medication, rest, ice    OBJECTIVE: (objective measures completed at initial evaluation unless otherwise dated)   PATIENT SURVEYS:  FOTO 33 01/14/22: 30% 01/29/22: 37% function    COGNITION: Overall cognitive status: Within functional limits for tasks assessed                         SENSATION: Light touch intact B LE although pt reports subjective numbness along lateral  portion of incision   EDEMA/INSPECTION:  Circumferential: 38cm L joint line, 39.6 cm R joint line  12cm distal to joint line 38cm RLE, 38.5cm LLE Incision still has steri strips on distal half, although visible portion appears to be healing well     PALPATION: Palpable tightness quad/TFL and hamstrings on operative limb   LOWER EXTREMITY ROM:   Active ROM Right eval Left eval Right 01/14/22 01/29/22 Right 02/05/22 Right   Knee flexion 106 118 106 111 115  Knee extension -2 0     Ankle dorsiflexion         Ankle plantarflexion         Ankle inversion         Ankle eversion          (Blank rows = not tested)   LOWER EXTREMITY MMT:   MMT Right eval Left eval  Hip flexion      Hip extension      Hip abduction      Hip adduction      Hip internal rotation      Hip external rotation      Knee flexion   WFL  Knee extension   WFL  Ankle dorsiflexion      Ankle plantarflexion      Ankle inversion      Ankle eversion       (Blank rows = not tested) Comments: operative limb not tested given recency of DVT      FUNCTIONAL TESTS:  STS: standard chair, heavy UE support, weight shift to L, stooped posture prior to initiating upright on rollator   Will plan to test TUG vs 5xSTS next session 01/02/2022: TUG: 28 seconds 5xSTS: 28 seconds   GAIT: Distance walked: within clinic Assistive device utilized: rollator Level of assistance: Modified independence Comments: reduced stance on R, reduced knee ROM on R, reduced hip extension B, increased lateral weight shifting     TODAY'S TREATMENT:  [facility] Adult PT Treatment:                                                DATE: 02/07/22 Therapeutic Exercise: Recumbent bike 57mn during subjective full revolutions Machine knee ext B 10# 2x15 cues for pacing Machine hamstring curl 20# 2x15 cues for pacing Standing hip ext cable column 3# each LE, 2x10, cues for form and hip squeeze Standing hip abd cybex 12.5#  2x12 cues for form and  setup Standing heel raises x20 cues for form and pacing BW squat  to parallel with UE support 2x8 cues for BOS and pacing  Bridge x15 cues for form and pacing Sidelying hip abduction x10  Modalities: Cold pack anterior/posterior knee, 10 min supine w/ bolster, no adverse events   [facility] Adult PT Treatment:                                                DATE: 02/05/22 Therapeutic Exercise: NuStep level 5 x 5 minutes  Resisted knee extension 3 x 10 @ 10 lbs  HS curl 3 x 10 @ 20 lbs  Bodyweight squats with UE support 2 x 10  Resisted hip abduction green band 2 x 10  Resisted hip extension green band 2 x 10  Leg press 3 x 10 @ 20 lbs  Sidelying hip abduction 2 x 10  Updated HEP  Modalities: Cold pack anterior/posterior knee, 10 min supine w/ bolster  [facility] Adult PT Treatment:                                                DATE: 01/31/22 Therapeutic Exercise: Nu step LE/UE 53mn during subjective 2inch step up with blue band TKE 2x12 RLE 4inch lateral step up 2x8 RLE, cues for reduced compensations at hip 7# STS x15 from lowest mat, cues for pacing 2x12 heel raises off of 2inch step LAQ 2# x20 seated SLR x15 cues for control Bridge x15 cues for hip squeeze  Modalities: Cold pack anterior/posterior knee, 10 min supine w/ bolster, no adverse events, tolerates well; time not billed     PATIENT EDUCATION:  Education details: HEP, rationale for interventions Person educated: Patient Education method: Explanation, demo, cues Education comprehension: verbalized understanding, returned demo, cues    HOME EXERCISE PROGRAM: Access Code: [email or web address]: [email or web address] Date: 01/10/2022 Prepared by: DEnis Slipper Exercises - Seated Long Arc Quad  - 1 x daily - 7 x weekly - 3 sets - 10 reps - Seated Knee Flexion AAROM  - 1 x daily - 7 x weekly - 3 sets - 10 reps - Small Range Straight Leg Raise  - 1 x daily - 7 x weekly - 3 sets - 5 reps - Sit to Stand with  Armchair  - 1 x daily - 7 x weekly - 2 sets - 8 reps   ASSESSMENT:   CLINICAL IMPRESSION: Pt arrives without significant pain, reports increased pain after community activity yesterday but back to baseline. Pt tolerates today's session quite well, able to progress for reduced rest breaks and increased time spent with machine exercises. Denies any increase in pain with activity, primary report of muscular fatigue. Reports excellent relief w/ ice packs at end of session, no adverse events. Pt departs today's session in no acute distress, all voiced questions/concerns addressed appropriately from PT perspective.      OBJECTIVE IMPAIRMENTS: Abnormal gait, decreased activity tolerance, decreased balance, decreased endurance, decreased knowledge of use of DME, decreased mobility, difficulty walking, decreased ROM, decreased strength, hypomobility, increased edema, impaired flexibility, and pain.    ACTIVITY LIMITATIONS: carrying, lifting, bending, sitting, standing, squatting, stairs, transfers, bathing, toileting, and locomotion level   PARTICIPATION LIMITATIONS: meal prep, cleaning, laundry, driving, shopping, and community activity   PERSONAL FACTORS: Age and 1-2 comorbidities: HTN,  DVT  are also affecting patient's functional outcome.    REHAB POTENTIAL: Good   CLINICAL DECISION MAKING: Evolving/moderate complexity   EVALUATION COMPLEXITY: Moderate     GOALS: Goals reviewed with patient? No   SHORT TERM GOALS: Target date: 01/22/2022             Pt will demonstrate appropriate understanding and performance of initially prescribed HEP in order to facilitate improved independence with management of symptoms.  Baseline: HEP provided on eval Goal status: MET Pt reports adherence 01/24/22   2. Pt will score greater than or equal to 40 on FOTO in order to demonstrate improved perception of function due to symptoms.            Baseline: 33 01/14/22: 30%            Goal status: Ongoing    LONG TERM GOALS: Target date: 02/19/2022    Pt will score 55 or greater on FOTO in order to demonstrate improved perception of function due to symptoms.  Baseline: 33 Goal status: ongoing    2.  Pt will demonstrate at least 120 degrees of R knee flexion AROM in order to facilitate improved tolerance to functional movements such as bending/squatting.  Baseline: see ROM chart above Goal status: ongoing    3.  Pt will be able to lift up to 10# with less than 2 pt increase in pain on NPS in order to demonstrate improved capacity for daily activities such as laundry/dishes.  Baseline: NT given difficulty with transfer as described above 01/29/22: progressing lifting as able.  Goal status: ongoing    4.  Pt will report/demonstrate ability to navigate up to 15 stairs safely in order to promote improved safety w/ home navigation.             Baseline: difficulty/pain with navigating stairs, requires rail  Status 01/29/22: utilizes [facility] for stair navigation.             Goal status: met     PLAN:   PT FREQUENCY: 2x/week   PT DURATION: 8 weeks   PLANNED INTERVENTIONS: Therapeutic exercises, Therapeutic activity, Neuromuscular re-education, Balance training, Gait training, Patient/Family education, Self Care, Joint mobilization, Stair training, DME instructions, Aquatic Therapy, Dry Needling, Electrical stimulation, Cryotherapy, Moist heat, Taping, Manual therapy, and Re-evaluation   PLAN FOR NEXT SESSION:   review/update HEP as indicated. Progress closed chain strengthening.   Leeroy Cha PT, DPT 02/07/2022 11:54 AM

## 2022-02-07 ENCOUNTER — Encounter: Payer: Self-pay | Admitting: Physical Therapy

## 2022-02-07 ENCOUNTER — Ambulatory Visit: Payer: Medicare Other | Admitting: Physical Therapy

## 2022-02-07 DIAGNOSIS — M25661 Stiffness of right knee, not elsewhere classified: Secondary | ICD-10-CM

## 2022-02-07 DIAGNOSIS — R2689 Other abnormalities of gait and mobility: Secondary | ICD-10-CM

## 2022-02-07 DIAGNOSIS — M25561 Pain in right knee: Secondary | ICD-10-CM

## 2022-02-07 DIAGNOSIS — M6281 Muscle weakness (generalized): Secondary | ICD-10-CM

## 2022-02-08 NOTE — Therapy (Signed)
OUTPATIENT PHYSICAL THERAPY TREATMENT NOTE  Patient Name: Cheryl Prince MRN: [id number] DOB:Sep 17, 1955, 66 y.o., female Today's Date: 02/11/2022  PCP: Elsie Stain, MD  REFERRING PROVIDER: Donella Stade, PA-C   END OF SESSION:   PT End of Session - 02/11/22 1008     Visit Number 14    Number of Visits 17    Date for PT Re-Evaluation 02/19/22    Authorization Type [employer] Medicare    Progress Note Due on Visit 20    PT Start Time 1009    PT Stop Time 1059    PT Time Calculation (min) 50 min    Activity Tolerance Patient tolerated treatment well;No increased pain    Behavior During Therapy WFL for tasks assessed/performed                  Past Medical History:  Diagnosis Date   Allergy    Anemia    Anxiety    Arthritis    Asthma    borderline bronchitis   Bronchitis    GERD (gastroesophageal reflux disease)    Goiter, toxic diffuse 05/30/2016   Hypertension    S/P tonsillectomy 05/30/2016   Sleep apnea    history of, MD tookpt. off machine 7 yrs, ago   Past Surgical History:  Procedure Laterality Date   ABDOMINAL HYSTERECTOMY     adnoides     BREAST SURGERY     CHOLECYSTECTOMY     COLONOSCOPY     DILATION AND CURETTAGE OF UTERUS     GASTRIC BYPASS  1998   IUD REMOVAL     TONSILLECTOMY     TOTAL KNEE ARTHROPLASTY Left 09/22/2018   TOTAL KNEE ARTHROPLASTY Left 09/22/2018   Procedure: LEFT TOTAL KNEE ARTHROPLASTY;  Surgeon: Meredith Pel, MD;  Location: [facility];  Service: Orthopedics;  Laterality: Left;   TOTAL KNEE ARTHROPLASTY Right 11/29/2021   Procedure: RIGHT TOTAL KNEE ARTHROPLASTY;  Surgeon: Meredith Pel, MD;  Location: [facility];  Service: Orthopedics;  Laterality: Right;   Patient Active Problem List   Diagnosis Date Noted   S/P total knee arthroplasty, right 12/26/2021   Deep venous thrombosis ([place]) 12/26/2021   Arthritis of knee 11/29/2021   Bipolar I disorder ([place]) 07/26/2020   Right hip pain 07/26/2020   Bronchitis  07/26/2020   Allergies 07/26/2020   GERD (gastroesophageal reflux disease) 07/26/2020   At risk for decreased bone density 07/26/2020   PTSD (post-traumatic stress disorder) 05/30/2016   Alpha thalassemia trait 05/30/2016   Hypertension 05/30/2016   Rotator cuff tendonitis, left 05/30/2016   Osteoarthritis of left knee 05/30/2016   Carpal tunnel syndrome 05/30/2016   Prediabetes 05/30/2016   Onychomycosis 05/30/2016    REFERRING DIAG: Z96.651 (ICD-10-CM) - Hx of total knee arthroplasty, right   THERAPY DIAG:  Right knee pain, unspecified chronicity  Stiffness of right knee, not elsewhere classified  Muscle weakness (generalized)  Other abnormalities of gait and mobility  Rationale for Evaluation and Treatment Rehabilitation  PERTINENT HISTORY: HTN, recent DVT on anticoagulant, GERD, TKA 11/29/21 on RLE, prior L TKA   PRECAUTIONS: Knee and Fall, recent DVT on anticoagulation   ONSET DATE: Oct 5th 2023   SUBJECTIVE:  SUBJECTIVE STATEMENT:    Pt denies any issues after last session, states she has been more active in community and starting to feel better with it. No other new updates    PAIN:  Are you having pain: 3/10 Location: Rt knee How would you describe your pain? sharp  Aggravating factors: occasionally with movement  Easing factors: medication, rest, ice    OBJECTIVE: (objective measures completed at initial evaluation unless otherwise dated)   PATIENT SURVEYS:  FOTO 33 01/14/22: 30% 01/29/22: 37% function    COGNITION: Overall cognitive status: Within functional limits for tasks assessed                         SENSATION: Light touch intact B LE although pt reports subjective numbness along lateral portion of incision   EDEMA/INSPECTION:  Circumferential: 38cm L joint line,  39.6 cm R joint line  12cm distal to joint line 38cm RLE, 38.5cm LLE Incision still has steri strips on distal half, although visible portion appears to be healing well     PALPATION: Palpable tightness quad/TFL and hamstrings on operative limb   LOWER EXTREMITY ROM:   Active ROM Right eval Left eval Right 01/14/22 01/29/22 Right 02/05/22 Right   Knee flexion 106 118 106 111 115  Knee extension -2 0     Ankle dorsiflexion         Ankle plantarflexion         Ankle inversion         Ankle eversion          (Blank rows = not tested)   LOWER EXTREMITY MMT:   MMT Right eval Left eval  Hip flexion      Hip extension      Hip abduction      Hip adduction      Hip internal rotation      Hip external rotation      Knee flexion   WFL  Knee extension   WFL  Ankle dorsiflexion      Ankle plantarflexion      Ankle inversion      Ankle eversion       (Blank rows = not tested) Comments: operative limb not tested given recency of DVT      FUNCTIONAL TESTS:  STS: standard chair, heavy UE support, weight shift to L, stooped posture prior to initiating upright on rollator   Will plan to test TUG vs 5xSTS next session 01/02/2022: TUG: 28 seconds 5xSTS: 28 seconds   GAIT: Distance walked: within clinic Assistive device utilized: rollator Level of assistance: Modified independence Comments: reduced stance on R, reduced knee ROM on R, reduced hip extension B, increased lateral weight shifting     TODAY'S TREATMENT:  [facility] Adult PT Treatment:                                                DATE: 02/11/22 Therapeutic Exercise: Recumbent bike 29mn during subjective  7# STS lowest mat 2x15 cues for pacing  5# ankle weight B heel raises 2x12 cues for pacing  BW squat x16 w/ B UE support cues for BOS 15# machine knee ext 2x12 25# machine knee curl x10, 20# 2x12 cues for pacing  Cybex hip abd 17.5# 2x10 cues for pacing and trunk posture 20# leg press galileo 2x15 cues for  pacing    Modalities: Cold pack anterior/posterior knee, 10 min supine w/ bolster, no adverse events, time not billed   [facility] Adult PT Treatment:                                                DATE: 02/07/22 Therapeutic Exercise: Recumbent bike 68mn during subjective full revolutions Machine knee ext B 10# 2x15 cues for pacing Machine hamstring curl 20# 2x15 cues for pacing Standing hip ext cable column 3# each LE, 2x10, cues for form and hip squeeze Standing hip abd cybex 12.5#  2x12 cues for form and setup Standing heel raises x20 cues for form and pacing BW squat to parallel with UE support 2x8 cues for BOS and pacing  Bridge x15 cues for form and pacing Sidelying hip abduction x10  Modalities: Cold pack anterior/posterior knee, 10 min supine w/ bolster, no adverse events   [facility] Adult PT Treatment:                                                DATE: 02/05/22 Therapeutic Exercise: NuStep level 5 x 5 minutes  Resisted knee extension 3 x 10 @ 10 lbs  HS curl 3 x 10 @ 20 lbs  Bodyweight squats with UE support 2 x 10  Resisted hip abduction green band 2 x 10  Resisted hip extension green band 2 x 10  Leg press 3 x 10 @ 20 lbs  Sidelying hip abduction 2 x 10  Updated HEP  Modalities: Cold pack anterior/posterior knee, 10 min supine w/ bolster     PATIENT EDUCATION:  Education details: HEP, rationale for interventions Person educated: Patient Education method: Explanation, demo, cues Education comprehension: verbalized understanding, returned demo, cues    HOME EXERCISE PROGRAM: Access Code: [email or web address]: [email or web address] Date: 01/10/2022 Prepared by: DEnis Slipper Exercises - Seated Long Arc Quad  - 1 x daily - 7 x weekly - 3 sets - 10 reps - Seated Knee Flexion AAROM  - 1 x daily - 7 x weekly - 3 sets - 10 reps - Small Range Straight Leg Raise  - 1 x daily - 7 x weekly - 3 sets - 5 reps - Sit to Stand with Armchair  - 1 x daily - 7 x weekly - 2 sets -  8 reps   ASSESSMENT:   CLINICAL IMPRESSION: Pt arrives w/ 3/10 pain, denies issues since last session, reports increasing community activity. Continues to tolerate activity well with progression for volume/resistance where able, pt taking minimal rest breaks on this date. Cues as needed for pacing and reduced compensations. Pt reports gradual improvement in symptoms with exercise, 0/10 pain on NPS post exercise. Reports excellent improvement in muscular fatigue with ice post session. No adverse events, tolerates session well overall. Pt departs today's session in no acute distress, all voiced questions/concerns addressed appropriately from PT perspective.      OBJECTIVE IMPAIRMENTS: Abnormal gait, decreased activity tolerance, decreased balance, decreased endurance, decreased knowledge of use of DME, decreased mobility, difficulty walking, decreased ROM, decreased strength, hypomobility, increased edema, impaired flexibility, and pain.    ACTIVITY LIMITATIONS: carrying, lifting, bending, sitting, standing, squatting, stairs, transfers, bathing, toileting, and locomotion level   PARTICIPATION LIMITATIONS: meal  prep, cleaning, laundry, driving, shopping, and community activity   PERSONAL FACTORS: Age and 1-2 comorbidities: HTN, DVT  are also affecting patient's functional outcome.    REHAB POTENTIAL: Good   CLINICAL DECISION MAKING: Evolving/moderate complexity   EVALUATION COMPLEXITY: Moderate     GOALS: Goals reviewed with patient? No   SHORT TERM GOALS: Target date: 01/22/2022             Pt will demonstrate appropriate understanding and performance of initially prescribed HEP in order to facilitate improved independence with management of symptoms.  Baseline: HEP provided on eval Goal status: MET Pt reports adherence 01/24/22   2. Pt will score greater than or equal to 40 on FOTO in order to demonstrate improved perception of function due to symptoms.            Baseline:  33 01/14/22: 30%            Goal status: Ongoing   LONG TERM GOALS: Target date: 02/19/2022    Pt will score 55 or greater on FOTO in order to demonstrate improved perception of function due to symptoms.  Baseline: 33 Goal status: ongoing    2.  Pt will demonstrate at least 120 degrees of R knee flexion AROM in order to facilitate improved tolerance to functional movements such as bending/squatting.  Baseline: see ROM chart above Goal status: ongoing    3.  Pt will be able to lift up to 10# with less than 2 pt increase in pain on NPS in order to demonstrate improved capacity for daily activities such as laundry/dishes.  Baseline: NT given difficulty with transfer as described above 01/29/22: progressing lifting as able.  Goal status: ongoing    4.  Pt will report/demonstrate ability to navigate up to 15 stairs safely in order to promote improved safety w/ home navigation.             Baseline: difficulty/pain with navigating stairs, requires rail  Status 01/29/22: utilizes [facility] for stair navigation.             Goal status: met     PLAN:   PT FREQUENCY: 2x/week   PT DURATION: 8 weeks   PLANNED INTERVENTIONS: Therapeutic exercises, Therapeutic activity, Neuromuscular re-education, Balance training, Gait training, Patient/Family education, Self Care, Joint mobilization, Stair training, DME instructions, Aquatic Therapy, Dry Needling, Electrical stimulation, Cryotherapy, Moist heat, Taping, Manual therapy, and Re-evaluation   PLAN FOR NEXT SESSION:    plan to update HEP next session. Continue LE strengthening as able/appropriate   Leeroy Cha PT, DPT 02/11/2022 11:58 AM

## 2022-02-11 ENCOUNTER — Ambulatory Visit: Payer: Medicare Other | Admitting: Physical Therapy

## 2022-02-11 ENCOUNTER — Encounter: Payer: Self-pay | Admitting: Physical Therapy

## 2022-02-11 DIAGNOSIS — M6281 Muscle weakness (generalized): Secondary | ICD-10-CM

## 2022-02-11 DIAGNOSIS — M25661 Stiffness of right knee, not elsewhere classified: Secondary | ICD-10-CM

## 2022-02-11 DIAGNOSIS — M25561 Pain in right knee: Secondary | ICD-10-CM | POA: Diagnosis not present

## 2022-02-11 DIAGNOSIS — R2689 Other abnormalities of gait and mobility: Secondary | ICD-10-CM

## 2022-02-12 NOTE — Therapy (Signed)
OUTPATIENT PHYSICAL THERAPY TREATMENT NOTE  Patient Name: Cheryl Prince MRN: [id number] DOB:12/19/1955, 66 y.o., female Today's Date: 02/13/2022  PCP: Elsie Stain, MD  REFERRING PROVIDER: Donella Stade, PA-C   END OF SESSION:   PT End of Session - 02/13/22 1101     Visit Number 15    Number of Visits 17    Date for PT Re-Evaluation 02/19/22    Authorization Type [employer] Medicare    Progress Note Due on Visit 20    PT Start Time 1101    PT Stop Time 1154   time not billed for ice pack   PT Time Calculation (min) 53 min    Activity Tolerance Patient tolerated treatment well;No increased pain    Behavior During Therapy WFL for tasks assessed/performed                   Past Medical History:  Diagnosis Date   Allergy    Anemia    Anxiety    Arthritis    Asthma    borderline bronchitis   Bronchitis    GERD (gastroesophageal reflux disease)    Goiter, toxic diffuse 05/30/2016   Hypertension    S/P tonsillectomy 05/30/2016   Sleep apnea    history of, MD tookpt. off machine 7 yrs, ago   Past Surgical History:  Procedure Laterality Date   ABDOMINAL HYSTERECTOMY     adnoides     BREAST SURGERY     CHOLECYSTECTOMY     COLONOSCOPY     DILATION AND CURETTAGE OF UTERUS     GASTRIC BYPASS  1998   IUD REMOVAL     TONSILLECTOMY     TOTAL KNEE ARTHROPLASTY Left 09/22/2018   TOTAL KNEE ARTHROPLASTY Left 09/22/2018   Procedure: LEFT TOTAL KNEE ARTHROPLASTY;  Surgeon: Meredith Pel, MD;  Location: [facility];  Service: Orthopedics;  Laterality: Left;   TOTAL KNEE ARTHROPLASTY Right 11/29/2021   Procedure: RIGHT TOTAL KNEE ARTHROPLASTY;  Surgeon: Meredith Pel, MD;  Location: [facility];  Service: Orthopedics;  Laterality: Right;   Patient Active Problem List   Diagnosis Date Noted   S/P total knee arthroplasty, right 12/26/2021   Deep venous thrombosis ([place]) 12/26/2021   Arthritis of knee 11/29/2021   Bipolar I disorder ([place]) 07/26/2020   Right hip  pain 07/26/2020   Bronchitis 07/26/2020   Allergies 07/26/2020   GERD (gastroesophageal reflux disease) 07/26/2020   At risk for decreased bone density 07/26/2020   PTSD (post-traumatic stress disorder) 05/30/2016   Alpha thalassemia trait 05/30/2016   Hypertension 05/30/2016   Rotator cuff tendonitis, left 05/30/2016   Osteoarthritis of left knee 05/30/2016   Carpal tunnel syndrome 05/30/2016   Prediabetes 05/30/2016   Onychomycosis 05/30/2016    REFERRING DIAG: Z96.651 (ICD-10-CM) - Hx of total knee arthroplasty, right   THERAPY DIAG:  Right knee pain, unspecified chronicity  Stiffness of right knee, not elsewhere classified  Other abnormalities of gait and mobility  Muscle weakness (generalized)  Rationale for Evaluation and Treatment Rehabilitation  PERTINENT HISTORY: HTN, recent DVT on anticoagulant, GERD, TKA 11/29/21 on RLE, prior L TKA   PRECAUTIONS: Knee and Fall, recent DVT on anticoagulation   ONSET DATE: Oct 5th 2023   SUBJECTIVE:  SUBJECTIVE STATEMENT:    Pt reports some dull pain at present, intermittent nerve pain. Denies any significant soreness or pain after last session    PAIN:  Are you having pain: 3/10 Location: Rt knee How would you describe your pain? sharp  Aggravating factors: occasionally with movement  Easing factors: medication, rest, ice    OBJECTIVE: (objective measures completed at initial evaluation unless otherwise dated)   PATIENT SURVEYS:  FOTO 33 01/14/22: 30% 01/29/22: 37% function    COGNITION: Overall cognitive status: Within functional limits for tasks assessed                         SENSATION: Light touch intact B LE although pt reports subjective numbness along lateral portion of incision   EDEMA/INSPECTION:  Circumferential: 38cm L  joint line, 39.6 cm R joint line  12cm distal to joint line 38cm RLE, 38.5cm LLE Incision still has steri strips on distal half, although visible portion appears to be healing well     PALPATION: Palpable tightness quad/TFL and hamstrings on operative limb   LOWER EXTREMITY ROM:   Active ROM Right eval Left eval Right 01/14/22 01/29/22 Right 02/05/22 Right   Knee flexion 106 118 106 111 115  Knee extension -2 0     Ankle dorsiflexion         Ankle plantarflexion         Ankle inversion         Ankle eversion          (Blank rows = not tested)   LOWER EXTREMITY MMT:   MMT Right eval Left eval  Hip flexion      Hip extension      Hip abduction      Hip adduction      Hip internal rotation      Hip external rotation      Knee flexion   WFL  Knee extension   WFL  Ankle dorsiflexion      Ankle plantarflexion      Ankle inversion      Ankle eversion       (Blank rows = not tested) Comments: operative limb not tested given recency of DVT      FUNCTIONAL TESTS:  STS: standard chair, heavy UE support, weight shift to L, stooped posture prior to initiating upright on rollator   Will plan to test TUG vs 5xSTS next session 01/02/2022: TUG: 28 seconds 5xSTS: 28 seconds   GAIT: Distance walked: within clinic Assistive device utilized: rollator Level of assistance: Modified independence Comments: reduced stance on R, reduced knee ROM on R, reduced hip extension B, increased lateral weight shifting     TODAY'S TREATMENT:  [facility] Adult PT Treatment:                                                DATE: 02/13/22 Therapeutic Exercise: Recumbent bike 30mn during subjective Knee ext 15# B 3x8 Knee curl 20# B 3x8 cues for full ROM  Mini lunge 2x10 B LE, unilat UE support, cues for form and pacing 2inch step down 2x10 RLE stance, cues for reduced hip drop  4inch step up + GTB TKE 3x8 cues for form and knee ext  Therapeutic Activity: Stair navigation ~133m at stair case, w/  varying levels of support STS 7# 2x15 cues  for mechanics and pacing, from lowest mat  Modalities: Cold pack anterior/posterior knee, 10 min supine w/ bolster, no adverse events   [facility] Adult PT Treatment:                                                DATE: 02/11/22 Therapeutic Exercise: Recumbent bike 49mn during subjective  7# STS lowest mat 2x15 cues for pacing  5# ankle weight B heel raises 2x12 cues for pacing  BW squat x16 w/ B UE support cues for BOS 15# machine knee ext 2x12 25# machine knee curl x10, 20# 2x12 cues for pacing  Cybex hip abd 17.5# 2x10 cues for pacing and trunk posture 20# leg press galileo 2x15 cues for pacing   Modalities: Cold pack anterior/posterior knee, 10 min supine w/ bolster, no adverse events, time not billed   [facility]                                                [facility]: [facility] Therapeutic Exercise: Recumbent bike 565m during subjective full revolutions Machine knee ext B 10# 2x15 cues for pacing Machine hamstring curl 20# 2x15 cues for pacing Standing hip ext cable column 3# each LE, 2x10, cues for form and hip squeeze Standing hip abd cybex 12.5#  2x12 cues for form and setup Standing heel raises x20 cues for form and pacing BW squat to parallel with UE support 2x8 cues for BOS and pacing  Bridge x15 cues for form and pacing Sidelying hip abduction x10  Modalities: Cold pack anterior/posterior knee, 10 min supine w/ bolster, no adverse events    PATIENT EDUCATION:  Education details: HEP update/review, rationale for interventions Person educated: Patient Education method: Explanation, demo, cues Education comprehension: verbalized understanding, returned demo, cues    HOME EXERCISE PROGRAM: Access Code: [email or web address]: [email or web address] Date: 02/13/2022 Prepared by: DaEnis SlipperExercises - Small Range Straight Leg Raise  - 1 x daily - 7 x weekly - 3 sets - 5 reps - Supine Bridge  - 1 x daily  - 7 x weekly - 3 sets - 10 reps - Standing Hip Abduction with Resistance at Ankles and Counter Support  - 1 x daily - 7 x weekly - 2 sets - 15 reps - Standing Hip Extension with Resistance at Ankles and Counter Support  - 1 x daily - 7 x weekly - 2 sets - 15 reps - Mini Squat with Counter Support  - 1 x daily - 7 x weekly - 2 sets - 10 reps   ASSESSMENT:   CLINICAL IMPRESSION: Pt arrives w/ 3/10 pain, no other new updates. Continues to tolerate session well, today focusing on quad strength/endurance with functional movement patterns; increased time working on stair navigation as pt reports having an event she has to attend at the end of the week, education on safety strategies. No adverse events or increase in pain, pt reports excellent response to cold pack at end of session, denies any pain on departure. HEP updated and reviewed as above given pt report of traveling over holidays. Pt departs today's session in no acute distress, all voiced questions/concerns addressed appropriately from PT perspective.      OBJECTIVE IMPAIRMENTS: Abnormal gait,  decreased activity tolerance, decreased balance, decreased endurance, decreased knowledge of use of DME, decreased mobility, difficulty walking, decreased ROM, decreased strength, hypomobility, increased edema, impaired flexibility, and pain.    ACTIVITY LIMITATIONS: carrying, lifting, bending, sitting, standing, squatting, stairs, transfers, bathing, toileting, and locomotion level   PARTICIPATION LIMITATIONS: meal prep, cleaning, laundry, driving, shopping, and community activity   PERSONAL FACTORS: Age and 1-2 comorbidities: HTN, DVT  are also affecting patient's functional outcome.    REHAB POTENTIAL: Good   CLINICAL DECISION MAKING: Evolving/moderate complexity   EVALUATION COMPLEXITY: Moderate     GOALS: Goals reviewed with patient? No   SHORT TERM GOALS: Target date: 01/22/2022             Pt will demonstrate appropriate understanding  and performance of initially prescribed HEP in order to facilitate improved independence with management of symptoms.  Baseline: HEP provided on eval Goal status: MET Pt reports adherence 01/24/22   2. Pt will score greater than or equal to 40 on FOTO in order to demonstrate improved perception of function due to symptoms.            Baseline: 33 01/14/22: 30%            Goal status: Ongoing   LONG TERM GOALS: Target date: 02/19/2022    Pt will score 55 or greater on FOTO in order to demonstrate improved perception of function due to symptoms.  Baseline: 33 Goal status: ongoing    2.  Pt will demonstrate at least 120 degrees of R knee flexion AROM in order to facilitate improved tolerance to functional movements such as bending/squatting.  Baseline: see ROM chart above Goal status: ongoing    3.  Pt will be able to lift up to 10# with less than 2 pt increase in pain on NPS in order to demonstrate improved capacity for daily activities such as laundry/dishes.  Baseline: NT given difficulty with transfer as described above 01/29/22: progressing lifting as able.  Goal status: ongoing    4.  Pt will report/demonstrate ability to navigate up to 15 stairs safely in order to promote improved safety w/ home navigation.             Baseline: difficulty/pain with navigating stairs, requires rail  Status 01/29/22: utilizes [facility] for stair navigation.             Goal status: met     PLAN:   PT FREQUENCY: 2x/week   PT DURATION: 8 weeks   PLANNED INTERVENTIONS: Therapeutic exercises, Therapeutic activity, Neuromuscular re-education, Balance training, Gait training, Patient/Family education, Self Care, Joint mobilization, Stair training, DME instructions, Aquatic Therapy, Dry Needling, Electrical stimulation, Cryotherapy, Moist heat, Taping, Manual therapy, and Re-evaluation   PLAN FOR NEXT SESSION:  Continue LE strengthening as able/appropriate   Leeroy Cha PT, DPT 02/13/2022 12:00 PM

## 2022-02-13 ENCOUNTER — Ambulatory Visit: Payer: Medicare Other | Admitting: Physical Therapy

## 2022-02-13 ENCOUNTER — Encounter: Payer: Self-pay | Admitting: Physical Therapy

## 2022-02-13 ENCOUNTER — Telehealth: Payer: Self-pay | Admitting: Orthopedic Surgery

## 2022-02-13 DIAGNOSIS — R2689 Other abnormalities of gait and mobility: Secondary | ICD-10-CM

## 2022-02-13 DIAGNOSIS — M6281 Muscle weakness (generalized): Secondary | ICD-10-CM

## 2022-02-13 DIAGNOSIS — M25561 Pain in right knee: Secondary | ICD-10-CM | POA: Diagnosis not present

## 2022-02-13 DIAGNOSIS — M25661 Stiffness of right knee, not elsewhere classified: Secondary | ICD-10-CM

## 2022-02-13 NOTE — Telephone Encounter (Signed)
sure

## 2022-02-13 NOTE — Telephone Encounter (Signed)
Patient is requesting extension on her leave through February due to her having a blood clot. Please advise if okay. Thank you

## 2022-02-14 NOTE — Telephone Encounter (Signed)
Note done Tried calling to advise. No answer.

## 2022-02-26 ENCOUNTER — Encounter: Payer: Self-pay | Admitting: Physical Therapy

## 2022-02-26 ENCOUNTER — Ambulatory Visit: Payer: Medicare Other | Attending: Surgical | Admitting: Physical Therapy

## 2022-02-26 DIAGNOSIS — M25661 Stiffness of right knee, not elsewhere classified: Secondary | ICD-10-CM | POA: Diagnosis not present

## 2022-02-26 DIAGNOSIS — M6281 Muscle weakness (generalized): Secondary | ICD-10-CM | POA: Diagnosis not present

## 2022-02-26 DIAGNOSIS — M25561 Pain in right knee: Secondary | ICD-10-CM | POA: Diagnosis not present

## 2022-02-26 DIAGNOSIS — R2689 Other abnormalities of gait and mobility: Secondary | ICD-10-CM | POA: Insufficient documentation

## 2022-02-26 NOTE — Therapy (Signed)
OUTPATIENT PHYSICAL THERAPY RECERTIFICATION + PROGRESS NOTE  Progress Note Reporting Period 12/25/21 to 02/26/22  See note below for Objective Data and Assessment of Progress/Goals.      Patient Name: Cheryl Prince MRN: [id number] DOB:20-May-1955, 67 y.o., female Today's Date: 02/26/2022  PCP: Elsie Stain, MD  REFERRING PROVIDER: Donella Stade, PA-C   END OF SESSION:   PT End of Session - 02/26/22 1547     Visit Number 16    Number of Visits 24    Date for PT Re-Evaluation 04/23/22    Authorization Type [employer]    Progress Note Due on Visit 26    PT Start Time 1547    PT Stop Time 0037   time not billed for ice pack   PT Time Calculation (min) 45 min    Activity Tolerance Patient tolerated treatment well;No increased pain    Behavior During Therapy WFL for tasks assessed/performed                    Past Medical History:  Diagnosis Date   Allergy    Anemia    Anxiety    Arthritis    Asthma    borderline bronchitis   Bronchitis    GERD (gastroesophageal reflux disease)    Goiter, toxic diffuse 05/30/2016   Hypertension    S/P tonsillectomy 05/30/2016   Sleep apnea    history of, MD tookpt. off machine 7 yrs, ago   Past Surgical History:  Procedure Laterality Date   ABDOMINAL HYSTERECTOMY     adnoides     BREAST SURGERY     CHOLECYSTECTOMY     COLONOSCOPY     DILATION AND CURETTAGE OF UTERUS     GASTRIC BYPASS  1998   IUD REMOVAL     TONSILLECTOMY     TOTAL KNEE ARTHROPLASTY Left 09/22/2018   TOTAL KNEE ARTHROPLASTY Left 09/22/2018   Procedure: LEFT TOTAL KNEE ARTHROPLASTY;  Surgeon: Meredith Pel, MD;  Location: [facility];  Service: Orthopedics;  Laterality: Left;   TOTAL KNEE ARTHROPLASTY Right 11/29/2021   Procedure: RIGHT TOTAL KNEE ARTHROPLASTY;  Surgeon: Meredith Pel, MD;  Location: [facility];  Service: Orthopedics;  Laterality: Right;   Patient Active Problem List   Diagnosis Date Noted   S/P total knee  arthroplasty, right 12/26/2021   Deep venous thrombosis ([place]) 12/26/2021   Arthritis of knee 11/29/2021   Bipolar I disorder ([place]) 07/26/2020   Right hip pain 07/26/2020   Bronchitis 07/26/2020   Allergies 07/26/2020   GERD (gastroesophageal reflux disease) 07/26/2020   At risk for decreased bone density 07/26/2020   PTSD (post-traumatic stress disorder) 05/30/2016   Alpha thalassemia trait 05/30/2016   Hypertension 05/30/2016   Rotator cuff tendonitis, left 05/30/2016   Osteoarthritis of left knee 05/30/2016   Carpal tunnel syndrome 05/30/2016   Prediabetes 05/30/2016   Onychomycosis 05/30/2016    REFERRING DIAG: Z96.651 (ICD-10-CM) - Hx of total knee arthroplasty, right   THERAPY DIAG:  Right knee pain, unspecified chronicity - Plan: PT plan of care cert/re-cert  Stiffness of right knee, not elsewhere classified - Plan: PT plan of care cert/re-cert  Other abnormalities of gait and mobility - Plan: PT plan of care cert/re-cert  Muscle weakness (generalized) - Plan: PT plan of care cert/re-cert  Rationale for Evaluation and Treatment Rehabilitation  PERTINENT HISTORY: HTN, recent DVT on anticoagulant, GERD, TKA 11/29/21 on RLE, prior L TKA   PRECAUTIONS: Knee and Fall, recent DVT on anticoagulation  ONSET DATE: Oct 5th 2023   SUBJECTIVE:                                                                                                                                                                                      SUBJECTIVE STATEMENT:    Pt arrives w/ report of increased fatigue after traveling for holidays and walking in store. Still reports increase in pain with community ambulation and stairs   PAIN:  Are you having pain: 2/10 Location: Rt knee How would you describe your pain? :poking Aggravating factors: occasionally with movement  Easing factors: medication, rest, ice    OBJECTIVE: (objective measures completed at initial evaluation unless otherwise  dated)   PATIENT SURVEYS:  FOTO 33 01/14/22: 30% 01/29/22: 37% function  02/26/22 43%    COGNITION: Overall cognitive status: Within functional limits for tasks assessed                         SENSATION: Light touch intact B LE although pt reports subjective numbness along lateral portion of incision   EDEMA/INSPECTION:  Circumferential: 38cm L joint line, 39.6 cm R joint line  12cm distal to joint line 38cm RLE, 38.5cm LLE Incision still has steri strips on distal half, although visible portion appears to be healing well     PALPATION: Palpable tightness quad/TFL and hamstrings on operative limb   LOWER EXTREMITY ROM:   Active ROM Right eval Left eval Right 01/14/22 01/29/22 Right 02/05/22 Right  02/26/22 Right    Knee flexion 106 118 106 111 115 117 deg  Knee extension -2 0    0   Ankle dorsiflexion          Ankle plantarflexion          Ankle inversion          Ankle eversion           (Blank rows = not tested)   LOWER EXTREMITY MMT:   MMT Right eval Left eval  Hip flexion      Hip extension      Hip abduction      Hip adduction      Hip internal rotation      Hip external rotation      Knee flexion   WFL  Knee extension   WFL  Ankle dorsiflexion      Ankle plantarflexion      Ankle inversion      Ankle eversion       (Blank rows = not tested) Comments: operative limb not tested given recency of DVT      FUNCTIONAL TESTS:  STS:  standard chair, heavy UE support, weight shift to L, stooped posture prior to initiating upright on rollator   Will plan to test TUG vs 5xSTS next session 01/02/2022: TUG: 28 seconds 5xSTS: 28 seconds  02/26/22:  5xSTS 15sec lowest mat no UE support   TUG 11 sec w/ gentle UE support     GAIT: Distance walked: within clinic Assistive device utilized: rollator Level of assistance: Modified independence Comments: reduced stance on R, reduced knee ROM on R, reduced hip extension B, increased lateral weight shifting      TODAY'S TREATMENT:  [facility] Adult PT Treatment:                                                DATE: 02/26/22 Therapeutic Exercise: Recumbent bike 34mn full revolutions Seated marches 2x15 Standing heel raises x12 2inch step down RLE 4x8 cues for reduced hip drop  4 inch lateral step ups x10 cues for form   Therapeutic Activity: FOTO + education 5xSTS and TUG + education 5# KB pickup from floor x5 Progress note + education Modalities: Cold pack anterior/posterior knee, 10 min supine w/ bolster, no adverse events  [facility] Adult PT Treatment:                                                DATE: 02/13/22 Therapeutic Exercise: Recumbent bike 557m during subjective Knee ext 15# B 3x8 Knee curl 20# B 3x8 cues for full ROM  Mini lunge 2x10 B LE, unilat UE support, cues for form and pacing 2inch step down 2x10 RLE stance, cues for reduced hip drop  4inch step up + GTB TKE 3x8 cues for form and knee ext  Therapeutic Activity: Stair navigation ~104mat stair case, w/ varying levels of support STS 7# 2x15 cues for mechanics and pacing, from lowest mat  Modalities: Cold pack anterior/posterior knee, 10 min supine w/ bolster, no adverse events   [facility] Adult PT Treatment:                                                DATE: 02/11/22 Therapeutic Exercise: Recumbent bike [id number] subjective  7# STS lowest mat 2x15 cues for pacing  5# ankle weight B heel raises 2x12 cues for pacing  BW squat x16 w/ B UE support cues for BOS 15# machine knee ext 2x12 25# machine knee curl x10, 20# 2x12 cues for pacing  Cybex hip abd 17.5# 2x10 cues for pacing and trunk posture 20# leg press galileo 2x15 cues for pacing   Modalities: Cold pack anterior/posterior knee, 10 min supine w/ bolster, no adverse events, time not billed    PATIENT EDUCATION:  Education details: rationale for interventions, progress note + POC Person educated: Patient Education method: ExplConsulting civil engineermo, cues Education  comprehension: verbalized understanding, returned demo, cues    HOME EXERCISE PROGRAM: Access Code: [email or web address]: [email or web address] Date: 02/13/2022 Prepared by: DaviEnis Slipperercises - Small Range Straight Leg Raise  - 1 x daily - 7 x weekly - 3 sets - 5 reps - Supine Bridge  - 1 x daily - 7  x weekly - 3 sets - 10 reps - Standing Hip Abduction with Resistance at Ankles and Counter Support  - 1 x daily - 7 x weekly - 2 sets - 15 reps - Standing Hip Extension with Resistance at Ankles and Counter Support  - 1 x daily - 7 x weekly - 2 sets - 15 reps - Mini Squat with Counter Support  - 1 x daily - 7 x weekly - 2 sets - 10 reps   ASSESSMENT:   CLINICAL IMPRESSION: Pt arrives w/ 2/10 pain, continues to report increased pain with community ambulation and stair navigation although she notes she has not been using rollator anymore. LTGs updated as below. FOTO scored at 43% which is improved compared to 33% on initial eval. Pt is progressing well towards goals but continued to have difficulty with higher level functional mobility, and while 5xSTS time has improved to 15sec (28sec initially) it remains within fall risk category. Recommend extension of PT POC for additional 4 weeks in order to emphasize LE endurance and stair navigation to maximize return to PLOF. Pt tolerates today's session well with no pain after exercise, but does report soreness/fatigue which responds quite well to ice. No adverse events. Pt departs today's session in no acute distress, all voiced questions/concerns addressed appropriately from PT perspective.      OBJECTIVE IMPAIRMENTS: Abnormal gait, decreased activity tolerance, decreased balance, decreased endurance, decreased knowledge of use of DME, decreased mobility, difficulty walking, decreased ROM, decreased strength, hypomobility, increased edema, impaired flexibility, and pain.    ACTIVITY LIMITATIONS: carrying, lifting, bending, sitting, standing,  squatting, stairs, transfers, bathing, toileting, and locomotion level   PARTICIPATION LIMITATIONS: meal prep, cleaning, laundry, driving, shopping, and community activity   PERSONAL FACTORS: Age and 1-2 comorbidities: HTN, DVT  are also affecting patient's functional outcome.    REHAB POTENTIAL: Good   CLINICAL DECISION MAKING: Evolving/moderate complexity   EVALUATION COMPLEXITY: Moderate     GOALS: Goals reviewed with patient? No   SHORT TERM GOALS: Target date: 01/22/2022             Pt will demonstrate appropriate understanding and performance of initially prescribed HEP in order to facilitate improved independence with management of symptoms.  Baseline: HEP provided on eval Goal status: MET Pt reports adherence 01/24/22   2. Pt will score greater than or equal to 40 on FOTO in order to demonstrate improved perception of function due to symptoms.            Baseline: 33 01/14/22: 30% 02/26/22: 43%             Goal status: MET   LONG TERM GOALS: Target date: 03/29/2022 (updated 02/26/22)     Pt will score 55 or greater on FOTO in order to demonstrate improved perception of function due to symptoms.  Baseline: 33 02/26/22: 43%  Goal status: ongoing    2.  Pt will demonstrate at least 120 degrees of R knee flexion AROM in order to facilitate improved tolerance to functional movements such as bending/squatting.  Baseline: see ROM chart above 02/26/22: 117 deg Goal status: ongoing    3.  Pt will be able to lift up to 10# with less than 2 pt increase in pain on NPS in order to demonstrate improved capacity for daily activities such as laundry/dishes.  Baseline: NT given difficulty with transfer as described above 01/29/22: progressing lifting as able.  02/26/22: 5# KB pickup from floor 5 repetitions no pain  Goal status: ongoing  4.  Pt will report/demonstrate ability to navigate up to 15 stairs safely in order to promote improved safety w/ home navigation.             Baseline:  difficulty/pain with navigating stairs, requires rail  Status 01/29/22: utilizes [facility] for stair navigation.   02/26/21: pt reports significant increase in pain particularly with descending stairs            Goal status: ongoing  5. Pt will be able to perform 5xSTS in 12 sec or less to indicate reduced fall risk and increased functional mobility.   Status: 02/26/22 15sec  Goal status: initial     PLAN (updated 02/26/22):   PT FREQUENCY: 2x/week   PT DURATION: 4 weeks   PLANNED INTERVENTIONS: Therapeutic exercises, Therapeutic activity, Neuromuscular re-education, Balance training, Gait training, Patient/Family education, Self Care, Joint mobilization, Stair training, DME instructions, Aquatic Therapy, Dry Needling, Electrical stimulation, Cryotherapy, Moist heat, Taping, Manual therapy, and Re-evaluation   PLAN FOR NEXT SESSION:   Continue LE strengthening as able/appropriate. Emphasis on endurance and stair navigation  Leeroy Cha PT, DPT 02/26/2022 5:38 PM

## 2022-02-27 ENCOUNTER — Ambulatory Visit (INDEPENDENT_AMBULATORY_CARE_PROVIDER_SITE_OTHER): Payer: Medicare Other | Admitting: Surgical

## 2022-02-27 DIAGNOSIS — Z96651 Presence of right artificial knee joint: Secondary | ICD-10-CM

## 2022-02-27 NOTE — Therapy (Signed)
OUTPATIENT PHYSICAL THERAPY TREATMENT NOTE     Patient Name: Cheryl Prince MRN: [id number] DOB:[birth year], 67 y.o., female Today's Date: 02/28/2022  PCP: Elsie Stain, MD  REFERRING PROVIDER: Donella Stade, PA-C   END OF SESSION:   PT End of Session - 02/28/22 1057     Visit Number 17    Number of Visits 24    Date for PT Re-Evaluation 04/23/22    Authorization Type [employer]    Progress Note Due on Visit 26    PT Start Time 1058    PT Stop Time 1148   last ten minutes not billed for ice pack   PT Time Calculation (min) 50 min    Activity Tolerance Patient tolerated treatment well;No increased pain    Behavior During Therapy WFL for tasks assessed/performed                     Past Medical History:  Diagnosis Date   Allergy    Anemia    Anxiety    Arthritis    Asthma    borderline bronchitis   Bronchitis    GERD (gastroesophageal reflux disease)    Goiter, toxic diffuse 05/30/2016   Hypertension    S/P tonsillectomy 05/30/2016   Sleep apnea    history of, MD tookpt. off machine 7 yrs, ago   Past Surgical History:  Procedure Laterality Date   ABDOMINAL HYSTERECTOMY     adnoides     BREAST SURGERY     CHOLECYSTECTOMY     COLONOSCOPY     DILATION AND CURETTAGE OF UTERUS     GASTRIC BYPASS  1998   IUD REMOVAL     TONSILLECTOMY     TOTAL KNEE ARTHROPLASTY Left 09/22/2018   TOTAL KNEE ARTHROPLASTY Left 09/22/2018   Procedure: LEFT TOTAL KNEE ARTHROPLASTY;  Surgeon: Meredith Pel, MD;  Location: [place];  Service: Orthopedics;  Laterality: Left;   TOTAL KNEE ARTHROPLASTY Right 11/29/2021   Procedure: RIGHT TOTAL KNEE ARTHROPLASTY;  Surgeon: Meredith Pel, MD;  Location: [place];  Service: Orthopedics;  Laterality: Right;   Patient Active Problem List   Diagnosis Date Noted   S/P total knee arthroplasty, right 12/26/2021   Deep venous thrombosis ([place]) 12/26/2021   Arthritis of knee 11/29/2021   Bipolar I disorder ([place])  07/26/2020   Right hip pain 07/26/2020   Bronchitis 07/26/2020   Allergies 07/26/2020   GERD (gastroesophageal reflux disease) 07/26/2020   At risk for decreased bone density 07/26/2020   PTSD (post-traumatic stress disorder) 05/30/2016   Alpha thalassemia trait 05/30/2016   Hypertension 05/30/2016   Rotator cuff tendonitis, left 05/30/2016   Osteoarthritis of left knee 05/30/2016   Carpal tunnel syndrome 05/30/2016   Prediabetes 05/30/2016   Onychomycosis 05/30/2016    REFERRING DIAG: Z96.651 (ICD-10-CM) - Hx of total knee arthroplasty, right   THERAPY DIAG:  Right knee pain, unspecified chronicity  Stiffness of right knee, not elsewhere classified  Other abnormalities of gait and mobility  Muscle weakness (generalized)  Rationale for Evaluation and Treatment Rehabilitation  PERTINENT HISTORY: HTN, recent DVT on anticoagulant, GERD, TKA 11/29/21 on RLE, prior L TKA   PRECAUTIONS: Knee and Fall, recent DVT on anticoagulation   ONSET DATE: Oct 5th 2023   SUBJECTIVE:  SUBJECTIVE STATEMENT:    Pt arrives with increased stiffness today but no overt increase in pain, no issues after last session. States she saw MD yesterday and was released, encouraged to continue PT.   PAIN:  Are you having pain: 2/10 Location: Rt knee How would you describe your pain? :poking Aggravating factors: occasionally with movement  Easing factors: medication, rest, ice    OBJECTIVE: (objective measures completed at initial evaluation unless otherwise dated)   PATIENT SURVEYS:  FOTO 33 01/14/22: 30% 01/29/22: 37% function  02/26/22 43%    COGNITION: Overall cognitive status: Within functional limits for tasks assessed                         SENSATION: Light touch intact B LE although pt reports subjective  numbness along lateral portion of incision   EDEMA/INSPECTION:  Circumferential: 38cm L joint line, 39.6 cm R joint line  12cm distal to joint line 38cm RLE, 38.5cm LLE Incision still has steri strips on distal half, although visible portion appears to be healing well     PALPATION: Palpable tightness quad/TFL and hamstrings on operative limb   LOWER EXTREMITY ROM:   Active ROM Right eval Left eval Right 01/14/22 01/29/22 Right 02/05/22 Right  02/26/22 Right    Knee flexion 106 118 106 111 115 117 deg  Knee extension -2 0    0   Ankle dorsiflexion          Ankle plantarflexion          Ankle inversion          Ankle eversion           (Blank rows = not tested)   LOWER EXTREMITY MMT:   MMT Right eval Left eval  Hip flexion      Hip extension      Hip abduction      Hip adduction      Hip internal rotation      Hip external rotation      Knee flexion   WFL  Knee extension   WFL  Ankle dorsiflexion      Ankle plantarflexion      Ankle inversion      Ankle eversion       (Blank rows = not tested) Comments: operative limb not tested given recency of DVT      FUNCTIONAL TESTS:  STS: standard chair, heavy UE support, weight shift to L, stooped posture prior to initiating upright on rollator   Will plan to test TUG vs 5xSTS next session 01/02/2022: TUG: 28 seconds 5xSTS: 28 seconds  02/26/22:  5xSTS 15sec lowest mat no UE support   TUG 11 sec w/ gentle UE support     GAIT: Distance walked: within clinic Assistive device utilized: rollator Level of assistance: Modified independence Comments: reduced stance on R, reduced knee ROM on R, reduced hip extension B, increased lateral weight shifting     TODAY'S TREATMENT:  [facility] Adult PT Treatment:                                                DATE: 02/28/22 Therapeutic Exercise: Recumbent bike 110mn full revolutions during subjective Single leg knee ext machine B 5# 2x8 Single leg hamstring curl machine B 15# 2x8 4  inch lateral step ups + GTB TKE 2x10  2 inch step downs 2x10 cues for control SLS 3 bouts to fatigue, longest ~20sec with touchdown support as needed, CGA BW squat w/ B UE support at cable column, 2x12 cues for improved symmetry of WB Bridge + ball squeeze 2x10 cues for form   Modalities: Cold pack anterior/posterior knee, 10 min supine w/ bolster, no adverse events   [facility] Adult PT Treatment:                                                DATE: 02/26/22 Therapeutic Exercise: Recumbent bike 60mn full revolutions Seated marches 2x15 Standing heel raises x12 2inch step down RLE 4x8 cues for reduced hip drop  4 inch lateral step ups x10 cues for form   Therapeutic Activity: FOTO + education 5xSTS and TUG + education 5# KB pickup from floor x5 Progress note + education Modalities: Cold pack anterior/posterior knee, 10 min supine w/ bolster, no adverse events  [facility] Adult PT Treatment:                                                DATE: 02/13/22 Therapeutic Exercise: Recumbent bike 521m during subjective Knee ext 15# B 3x8 Knee curl 20# B 3x8 cues for full ROM  Mini lunge 2x10 B LE, unilat UE support, cues for form and pacing 2inch step down 2x10 RLE stance, cues for reduced hip drop  4inch step up + GTB TKE 3x8 cues for form and knee ext  Therapeutic Activity: Stair navigation ~1037mat stair case, w/ varying levels of support STS 7# 2x15 cues for mechanics and pacing, from lowest mat  Modalities: Cold pack anterior/posterior knee, 10 min supine w/ bolster, no adverse events    PATIENT EDUCATION:  Education details: rationale for interventions Person educated: Patient Education method: ExpConsulting civil engineeremo, cues Education comprehension: verbalized understanding, returned demo, cues    HOME EXERCISE PROGRAM: Access Code: [email or web address]: [email or web address] Date: 02/13/2022 Prepared by: DavEnis Slipperxercises - Small Range Straight Leg Raise  - 1 x daily -  7 x weekly - 3 sets - 5 reps - Supine Bridge  - 1 x daily - 7 x weekly - 3 sets - 10 reps - Standing Hip Abduction with Resistance at Ankles and Counter Support  - 1 x daily - 7 x weekly - 2 sets - 15 reps - Standing Hip Extension with Resistance at Ankles and Counter Support  - 1 x daily - 7 x weekly - 2 sets - 15 reps - Mini Squat with Counter Support  - 1 x daily - 7 x weekly - 2 sets - 10 reps   ASSESSMENT:   CLINICAL IMPRESSION: Pt arrives w/ 2/10 pain and stiffness, followed up with MD yesterday. Tolerates today's session well with focusing on single limb control in open and closed chain. Pt remains limited by reduced quad control, particular with eccentric movements, which is contributing to difficulty with prolonged ambulation and stair navigation. No adverse events, good relief from relief at end of session, no pain on departure. Pt departs today's session in no acute distress, all voiced questions/concerns addressed appropriately from PT perspective.     OBJECTIVE IMPAIRMENTS: Abnormal gait, decreased activity tolerance, decreased balance, decreased endurance, decreased knowledge of  use of DME, decreased mobility, difficulty walking, decreased ROM, decreased strength, hypomobility, increased edema, impaired flexibility, and pain.    ACTIVITY LIMITATIONS: carrying, lifting, bending, sitting, standing, squatting, stairs, transfers, bathing, toileting, and locomotion level   PARTICIPATION LIMITATIONS: meal prep, cleaning, laundry, driving, shopping, and community activity   PERSONAL FACTORS: Age and 1-2 comorbidities: HTN, DVT  are also affecting patient's functional outcome.    REHAB POTENTIAL: Good   CLINICAL DECISION MAKING: Evolving/moderate complexity   EVALUATION COMPLEXITY: Moderate     GOALS: Goals reviewed with patient? No   SHORT TERM GOALS: Target date: 01/22/2022             Pt will demonstrate appropriate understanding and performance of initially prescribed HEP in  order to facilitate improved independence with management of symptoms.  Baseline: HEP provided on eval Goal status: MET Pt reports adherence 01/24/22   2. Pt will score greater than or equal to 40 on FOTO in order to demonstrate improved perception of function due to symptoms.            Baseline: 33 01/14/22: 30% 02/26/22: 43%             Goal status: MET   LONG TERM GOALS: Target date: 03/29/2022 (updated 02/26/22)     Pt will score 55 or greater on FOTO in order to demonstrate improved perception of function due to symptoms.  Baseline: 33 02/26/22: 43%  Goal status: ongoing    2.  Pt will demonstrate at least 120 degrees of R knee flexion AROM in order to facilitate improved tolerance to functional movements such as bending/squatting.  Baseline: see ROM chart above 02/26/22: 117 deg Goal status: ongoing    3.  Pt will be able to lift up to 10# with less than 2 pt increase in pain on NPS in order to demonstrate improved capacity for daily activities such as laundry/dishes.  Baseline: NT given difficulty with transfer as described above 01/29/22: progressing lifting as able.  02/26/22: 5# KB pickup from floor 5 repetitions no pain  Goal status: ongoing    4.  Pt will report/demonstrate ability to navigate up to 15 stairs safely in order to promote improved safety w/ home navigation.             Baseline: difficulty/pain with navigating stairs, requires rail  Status 01/29/22: utilizes [facility] for stair navigation.   02/26/21: pt reports significant increase in pain particularly with descending stairs            Goal status: ongoing  5. Pt will be able to perform 5xSTS in 12 sec or less to indicate reduced fall risk and increased functional mobility.   Status: 02/26/22 15sec  Goal status: initial     PLAN (updated 02/26/22):   PT FREQUENCY: 2x/week   PT DURATION: 4 weeks   PLANNED INTERVENTIONS: Therapeutic exercises, Therapeutic activity, Neuromuscular re-education, Balance training, Gait  training, Patient/Family education, Self Care, Joint mobilization, Stair training, DME instructions, Aquatic Therapy, Dry Needling, Electrical stimulation, Cryotherapy, Moist heat, Taping, Manual therapy, and Re-evaluation   PLAN FOR NEXT SESSION:   Continue LE strengthening as able/appropriate. Emphasis on endurance and stair navigation  Leeroy Cha PT, DPT 02/28/2022 12:38 PM

## 2022-02-28 ENCOUNTER — Encounter: Payer: Self-pay | Admitting: Orthopedic Surgery

## 2022-02-28 ENCOUNTER — Encounter: Payer: Self-pay | Admitting: Physical Therapy

## 2022-02-28 ENCOUNTER — Ambulatory Visit: Payer: Medicare Other | Admitting: Physical Therapy

## 2022-02-28 DIAGNOSIS — M25561 Pain in right knee: Secondary | ICD-10-CM

## 2022-02-28 DIAGNOSIS — M25661 Stiffness of right knee, not elsewhere classified: Secondary | ICD-10-CM

## 2022-02-28 DIAGNOSIS — M6281 Muscle weakness (generalized): Secondary | ICD-10-CM | POA: Diagnosis not present

## 2022-02-28 DIAGNOSIS — R2689 Other abnormalities of gait and mobility: Secondary | ICD-10-CM | POA: Diagnosis not present

## 2022-02-28 NOTE — Progress Notes (Signed)
Post-Op Visit Note   Patient: Cheryl Prince           Date of Birth: [birth year]           MRN: [id number] Visit Date: 02/27/2022 PCP: Elsie Stain, MD   Assessment & Plan:  Chief Complaint:  Chief Complaint  Patient presents with   Right Knee - Routine Post Op    right total knee replacement 11/29/2021   Visit Diagnoses:  1. Hx of total knee arthroplasty, right     Plan: Patient is a 67 year old female who presents s/p right total knee arthroplasty on 11/29/2021.  She states that she is doing well.  She has continued stiffness.  Notes pain in the medial aspect of the knee and lateral numbness.  She is still in physical therapy 2 times per week as scheduled through the end of January.  She denies any calf pain.  Pain does not really bother her at night anymore but mostly she complains of stiffness in the morning and stiffness after she is sedentary for a long period of time.  No fevers or chills.  On exam, patient has well-healed incision.  She has excellent extension to 0 degrees and flexion to 115 degrees.  No calf tenderness.  Negative Homans' sign.  She has excellent quad strength rated 5/5 and she is able to perform straight leg raise without extensor lag.  No gross mid flexion instability.  Stable to varus valgus stress.  Ambulates without significant antalgia.  Not walking with any cane or walker.  Plan is to continue with physical therapy.  She will follow-up with the office as needed.  Recommended if she does not have any significant improvement in her pain in the next 2 to 3 months, she may come back so we can take a look at her knee again.  Overall, it seems she is progressing well as expected.  Call with any concerns.  She has done a great job of getting her leg straight and good flexion range of motion.  Follow-Up Instructions: No follow-ups on file.   Orders:  No orders of the defined types were placed in this encounter.  No orders of the defined types were  placed in this encounter.   Imaging: No results found.  PMFS History: Patient Active Problem List   Diagnosis Date Noted   S/P total knee arthroplasty, right 12/26/2021   Deep venous thrombosis ([place]) 12/26/2021   Arthritis of knee 11/29/2021   Bipolar I disorder ([place]) 07/26/2020   Right hip pain 07/26/2020   Bronchitis 07/26/2020   Allergies 07/26/2020   GERD (gastroesophageal reflux disease) 07/26/2020   At risk for decreased bone density 07/26/2020   PTSD (post-traumatic stress disorder) 05/30/2016   Alpha thalassemia trait 05/30/2016   Hypertension 05/30/2016   Rotator cuff tendonitis, left 05/30/2016   Osteoarthritis of left knee 05/30/2016   Carpal tunnel syndrome 05/30/2016   Prediabetes 05/30/2016   Onychomycosis 05/30/2016   Past Medical History:  Diagnosis Date   Allergy    Anemia    Anxiety    Arthritis    Asthma    borderline bronchitis   Bronchitis    GERD (gastroesophageal reflux disease)    Goiter, toxic diffuse 05/30/2016   Hypertension    S/P tonsillectomy 05/30/2016   Sleep apnea    history of, MD tookpt. off machine 7 yrs, ago    Family History  Problem Relation Age of Onset   Hypertension Father    Diabetes Father  Lupus Daughter    Colon cancer Maternal Aunt    Colon cancer Maternal Uncle        4 mat uncles dx colon ca   Esophageal cancer Neg Hx    Rectal cancer Neg Hx    Stomach cancer Neg Hx     Past Surgical History:  Procedure Laterality Date   ABDOMINAL HYSTERECTOMY     adnoides     BREAST SURGERY     CHOLECYSTECTOMY     COLONOSCOPY     DILATION AND CURETTAGE OF UTERUS     GASTRIC BYPASS  1998   IUD REMOVAL     TONSILLECTOMY     TOTAL KNEE ARTHROPLASTY Left 09/22/2018   TOTAL KNEE ARTHROPLASTY Left 09/22/2018   Procedure: LEFT TOTAL KNEE ARTHROPLASTY;  Surgeon: Meredith Pel, MD;  Location: [place];  Service: Orthopedics;  Laterality: Left;   TOTAL KNEE ARTHROPLASTY Right 11/29/2021   Procedure: RIGHT TOTAL KNEE  ARTHROPLASTY;  Surgeon: Meredith Pel, MD;  Location: [place];  Service: Orthopedics;  Laterality: Right;   Social History   Occupational History   Not on file  Tobacco Use   Smoking status: Former    Packs/day: 1.50    Years: 18.00    Total pack years: 27.00    Types: Cigarettes    Quit date: 09/16/1984    Years since quitting: 37.4   Smokeless tobacco: Never   Tobacco comments:    quit 1986  Vaping Use   Vaping Use: Never used  Substance and Sexual Activity   Alcohol use: Yes    Comment: social   Drug use: No   Sexual activity: Not on file

## 2022-03-05 ENCOUNTER — Ambulatory Visit: Payer: Medicare Other

## 2022-03-05 DIAGNOSIS — M25561 Pain in right knee: Secondary | ICD-10-CM

## 2022-03-05 DIAGNOSIS — M6281 Muscle weakness (generalized): Secondary | ICD-10-CM | POA: Diagnosis not present

## 2022-03-05 DIAGNOSIS — M25661 Stiffness of right knee, not elsewhere classified: Secondary | ICD-10-CM | POA: Diagnosis not present

## 2022-03-05 DIAGNOSIS — R2689 Other abnormalities of gait and mobility: Secondary | ICD-10-CM | POA: Diagnosis not present

## 2022-03-05 NOTE — Therapy (Signed)
OUTPATIENT PHYSICAL THERAPY TREATMENT NOTE     Patient Name: Kailin Bakos MRN: [id number] DOB:[birth year], 67 y.o., female Today's Date: 03/05/2022  PCP: Elsie Stain, MD  REFERRING PROVIDER: Donella Stade, PA-C   END OF SESSION:   PT End of Session - 03/05/22 1128     Visit Number 18    Number of Visits 24    Date for PT Re-Evaluation 04/23/22    Authorization Type [employer] Medicare    Progress Note Due on Visit 26    PT Start Time 1130    PT Stop Time 1210    PT Time Calculation (min) 40 min    Activity Tolerance Patient tolerated treatment well;No increased pain    Behavior During Therapy WFL for tasks assessed/performed              Past Medical History:  Diagnosis Date   Allergy    Anemia    Anxiety    Arthritis    Asthma    borderline bronchitis   Bronchitis    GERD (gastroesophageal reflux disease)    Goiter, toxic diffuse 05/30/2016   Hypertension    S/P tonsillectomy 05/30/2016   Sleep apnea    history of, MD tookpt. off machine 7 yrs, ago   Past Surgical History:  Procedure Laterality Date   ABDOMINAL HYSTERECTOMY     adnoides     BREAST SURGERY     CHOLECYSTECTOMY     COLONOSCOPY     DILATION AND CURETTAGE OF UTERUS     GASTRIC BYPASS  1998   IUD REMOVAL     TONSILLECTOMY     TOTAL KNEE ARTHROPLASTY Left 09/22/2018   TOTAL KNEE ARTHROPLASTY Left 09/22/2018   Procedure: LEFT TOTAL KNEE ARTHROPLASTY;  Surgeon: Meredith Pel, MD;  Location: [place];  Service: Orthopedics;  Laterality: Left;   TOTAL KNEE ARTHROPLASTY Right 11/29/2021   Procedure: RIGHT TOTAL KNEE ARTHROPLASTY;  Surgeon: Meredith Pel, MD;  Location: [place];  Service: Orthopedics;  Laterality: Right;   Patient Active Problem List   Diagnosis Date Noted   S/P total knee arthroplasty, right 12/26/2021   Deep venous thrombosis ([place]) 12/26/2021   Arthritis of knee 11/29/2021   Bipolar I disorder ([place]) 07/26/2020   Right hip pain 07/26/2020   Bronchitis  07/26/2020   Allergies 07/26/2020   GERD (gastroesophageal reflux disease) 07/26/2020   At risk for decreased bone density 07/26/2020   PTSD (post-traumatic stress disorder) 05/30/2016   Alpha thalassemia trait 05/30/2016   Hypertension 05/30/2016   Rotator cuff tendonitis, left 05/30/2016   Osteoarthritis of left knee 05/30/2016   Carpal tunnel syndrome 05/30/2016   Prediabetes 05/30/2016   Onychomycosis 05/30/2016    REFERRING DIAG: Z96.651 (ICD-10-CM) - Hx of total knee arthroplasty, right   THERAPY DIAG:  Right knee pain, unspecified chronicity  Stiffness of right knee, not elsewhere classified  Other abnormalities of gait and mobility  Muscle weakness (generalized)  Rationale for Evaluation and Treatment Rehabilitation  PERTINENT HISTORY: HTN, recent DVT on anticoagulant, GERD, TKA 11/29/21 on RLE, prior L TKA   PRECAUTIONS: Knee and Fall, recent DVT on anticoagulation   ONSET DATE: Oct 5th 2023   SUBJECTIVE:  SUBJECTIVE STATEMENT:    Patient reports more dull and stiffness  PAIN:  Are you having pain: 0/10 Location: Rt knee How would you describe your pain? :poking Aggravating factors: occasionally with movement  Easing factors: medication, rest, ice    OBJECTIVE: (objective measures completed at initial evaluation unless otherwise dated)   PATIENT SURVEYS:  FOTO 33 01/14/22: 30% 01/29/22: 37% function  02/26/22 43%    COGNITION: Overall cognitive status: Within functional limits for tasks assessed                         SENSATION: Light touch intact B LE although pt reports subjective numbness along lateral portion of incision   EDEMA/INSPECTION:  Circumferential: 38cm L joint line, 39.6 cm R joint line  12cm distal to joint line 38cm RLE, 38.5cm LLE Incision still has  steri strips on distal half, although visible portion appears to be healing well     PALPATION: Palpable tightness quad/TFL and hamstrings on operative limb   LOWER EXTREMITY ROM:   Active ROM Right eval Left eval Right 01/14/22 01/29/22 Right 02/05/22 Right  02/26/22 Right    Knee flexion 106 118 106 111 115 117 deg  Knee extension -2 0    0   Ankle dorsiflexion          Ankle plantarflexion          Ankle inversion          Ankle eversion           (Blank rows = not tested)   LOWER EXTREMITY MMT:   MMT Right eval Left eval  Hip flexion      Hip extension      Hip abduction      Hip adduction      Hip internal rotation      Hip external rotation      Knee flexion   WFL  Knee extension   WFL  Ankle dorsiflexion      Ankle plantarflexion      Ankle inversion      Ankle eversion       (Blank rows = not tested) Comments: operative limb not tested given recency of DVT      FUNCTIONAL TESTS:  STS: standard chair, heavy UE support, weight shift to L, stooped posture prior to initiating upright on rollator   Will plan to test TUG vs 5xSTS next session 01/02/2022: TUG: 28 seconds 5xSTS: 28 seconds  02/26/22:  5xSTS 15sec lowest mat no UE support   TUG 11 sec w/ gentle UE support     GAIT: Distance walked: within clinic Assistive device utilized: rollator Level of assistance: Modified independence Comments: reduced stance on R, reduced knee ROM on R, reduced hip extension B, increased lateral weight shifting     TODAY'S TREATMENT:  [facility] Adult PT Treatment:                                                DATE: 03/05/2022 Therapeutic Exercise: Recumbent bike full revolutions during subjective Single leg knee ext machine B 5# 2x10 Single leg hamstring curl machine B 15# 2x10 4 inch lateral step ups + GTB TKE 2x10 2 inch step downs 2x10 cues for control SLS 2 bouts to fatigue, longest ~30sec with touchdown support as needed, CGA BW squat w/ B  UE support at cable  column, 2x10 cues for improved symmetry of WB Bridge + ball squeeze 2x10 cues for form  SLR 2x10 BIL Modalities: Cold pack anterior/posterior knee, 10 min supine w/ bolster, no adverse events  [facility] Adult PT Treatment:                                                DATE: 02/28/22 Therapeutic Exercise: Recumbent bike 92min full revolutions during subjective Single leg knee ext machine B 5# 2x8 Single leg hamstring curl machine B 15# 2x8 4 inch lateral step ups + GTB TKE 2x10 2 inch step downs 2x10 cues for control SLS 3 bouts to fatigue, longest ~20sec with touchdown support as needed, CGA BW squat w/ B UE support at cable column, 2x12 cues for improved symmetry of WB Bridge + ball squeeze 2x10 cues for form   Modalities: Cold pack anterior/posterior knee, 10 min supine w/ bolster, no adverse events   [facility] Adult PT Treatment:                                                DATE: 02/26/22 Therapeutic Exercise: Recumbent bike 76min full revolutions Seated marches 2x15 Standing heel raises x12 2inch step down RLE 4x8 cues for reduced hip drop  4 inch lateral step ups x10 cues for form   Therapeutic Activity: FOTO + education 5xSTS and TUG + education 5# KB pickup from floor x5 Progress note + education Modalities: Cold pack anterior/posterior knee, 10 min supine w/ bolster, no adverse events    PATIENT EDUCATION:  Education details: rationale for interventions Person educated: Patient Education method: Consulting civil engineer, demo, cues Education comprehension: verbalized understanding, returned demo, cues    HOME EXERCISE PROGRAM: Access Code: [email or web address] URL: [email or web address] Date: 02/13/2022 Prepared by: Enis Slipper  Exercises - Small Range Straight Leg Raise  - 1 x daily - 7 x weekly - 3 sets - 5 reps - Supine Bridge  - 1 x daily - 7 x weekly - 3 sets - 10 reps - Standing Hip Abduction with Resistance at Ankles and Counter Support  - 1 x daily - 7 x weekly - 2  sets - 15 reps - Standing Hip Extension with Resistance at Ankles and Counter Support  - 1 x daily - 7 x weekly - 2 sets - 15 reps - Mini Squat with Counter Support  - 1 x daily - 7 x weekly - 2 sets - 10 reps   ASSESSMENT:   CLINICAL IMPRESSION: Patient presents to PT with no current pain, only describes a dull stiffness. Today's session continued to focus on eccentric quad strengthening as well as open and closed chain activities and balance training. Patient was able to tolerate all prescribed exercises with no adverse effects. Patient continues to benefit from skilled PT services and should be progressed as able to improve functional independence.   OBJECTIVE IMPAIRMENTS: Abnormal gait, decreased activity tolerance, decreased balance, decreased endurance, decreased knowledge of use of DME, decreased mobility, difficulty walking, decreased ROM, decreased strength, hypomobility, increased edema, impaired flexibility, and pain.    ACTIVITY LIMITATIONS: carrying, lifting, bending, sitting, standing, squatting, stairs, transfers, bathing, toileting, and locomotion level   PARTICIPATION LIMITATIONS: meal prep, cleaning, laundry, driving, shopping,  and community activity   PERSONAL FACTORS: Age and 1-2 comorbidities: HTN, DVT  are also affecting patient's functional outcome.    REHAB POTENTIAL: Good   CLINICAL DECISION MAKING: Evolving/moderate complexity   EVALUATION COMPLEXITY: Moderate     GOALS: Goals reviewed with patient? No   SHORT TERM GOALS: Target date: 01/22/2022             Pt will demonstrate appropriate understanding and performance of initially prescribed HEP in order to facilitate improved independence with management of symptoms.  Baseline: HEP provided on eval Goal status: MET Pt reports adherence 01/24/22   2. Pt will score greater than or equal to 40 on FOTO in order to demonstrate improved perception of function due to symptoms.            Baseline: 33 01/14/22:  30% 02/26/22: 43%             Goal status: MET   LONG TERM GOALS: Target date: 03/29/2022 (updated 02/26/22)     Pt will score 55 or greater on FOTO in order to demonstrate improved perception of function due to symptoms.  Baseline: 33 02/26/22: 43%  Goal status: ongoing    2.  Pt will demonstrate at least 120 degrees of R knee flexion AROM in order to facilitate improved tolerance to functional movements such as bending/squatting.  Baseline: see ROM chart above 02/26/22: 117 deg Goal status: ongoing    3.  Pt will be able to lift up to 10# with less than 2 pt increase in pain on NPS in order to demonstrate improved capacity for daily activities such as laundry/dishes.  Baseline: NT given difficulty with transfer as described above 01/29/22: progressing lifting as able.  02/26/22: 5# KB pickup from floor 5 repetitions no pain  Goal status: ongoing    4.  Pt will report/demonstrate ability to navigate up to 15 stairs safely in order to promote improved safety w/ home navigation.             Baseline: difficulty/pain with navigating stairs, requires rail  Status 01/29/22: utilizes [facility] for stair navigation.   02/26/21: pt reports significant increase in pain particularly with descending stairs            Goal status: ongoing  5. Pt will be able to perform 5xSTS in 12 sec or less to indicate reduced fall risk and increased functional mobility.   Status: 02/26/22 15sec  Goal status: initial     PLAN (updated 02/26/22):   PT FREQUENCY: 2x/week   PT DURATION: 4 weeks   PLANNED INTERVENTIONS: Therapeutic exercises, Therapeutic activity, Neuromuscular re-education, Balance training, Gait training, Patient/Family education, Self Care, Joint mobilization, Stair training, DME instructions, Aquatic Therapy, Dry Needling, Electrical stimulation, Cryotherapy, Moist heat, Taping, Manual therapy, and Re-evaluation   PLAN FOR NEXT SESSION:   Continue LE strengthening as able/appropriate. Emphasis on endurance  and stair navigation  Margarette Canada, [place] 03/05/22 11:29 AM

## 2022-03-06 NOTE — Therapy (Signed)
OUTPATIENT PHYSICAL THERAPY TREATMENT NOTE     Patient Name: Cheryl Prince MRN: [id number] DOB:[birth year], 67 y.o., female Today's Date: 03/07/2022  PCP: Storm Frisk, MD  REFERRING PROVIDER: Julieanne Cotton, PA-C   END OF SESSION:   PT End of Session - 03/07/22 1549     Visit Number 19    Number of Visits 24    Date for PT Re-Evaluation 04/23/22    Authorization Type [employer] Medicare    Progress Note Due on Visit 26    PT Start Time 1546    PT Stop Time 1632   time not billed for ice pack   PT Time Calculation (min) 46 min    Activity Tolerance Patient tolerated treatment well;No increased pain    Behavior During Therapy WFL for tasks assessed/performed               Past Medical History:  Diagnosis Date   Allergy    Anemia    Anxiety    Arthritis    Asthma    borderline bronchitis   Bronchitis    GERD (gastroesophageal reflux disease)    Goiter, toxic diffuse 05/30/2016   Hypertension    S/P tonsillectomy 05/30/2016   Sleep apnea    history of, MD tookpt. off machine 7 yrs, ago   Past Surgical History:  Procedure Laterality Date   ABDOMINAL HYSTERECTOMY     adnoides     BREAST SURGERY     CHOLECYSTECTOMY     COLONOSCOPY     DILATION AND CURETTAGE OF UTERUS     GASTRIC BYPASS  1998   IUD REMOVAL     TONSILLECTOMY     TOTAL KNEE ARTHROPLASTY Left 09/22/2018   TOTAL KNEE ARTHROPLASTY Left 09/22/2018   Procedure: LEFT TOTAL KNEE ARTHROPLASTY;  Surgeon: Cammy Copa, MD;  Location: [facility];  Service: Orthopedics;  Laterality: Left;   TOTAL KNEE ARTHROPLASTY Right 11/29/2021   Procedure: RIGHT TOTAL KNEE ARTHROPLASTY;  Surgeon: Cammy Copa, MD;  Location: [facility];  Service: Orthopedics;  Laterality: Right;   Patient Active Problem List   Diagnosis Date Noted   S/P total knee arthroplasty, right 12/26/2021   Deep venous thrombosis ([facility]) 12/26/2021   Arthritis of knee 11/29/2021   Bipolar I disorder ([facility]) 07/26/2020   Right hip pain  07/26/2020   Bronchitis 07/26/2020   Allergies 07/26/2020   GERD (gastroesophageal reflux disease) 07/26/2020   At risk for decreased bone density 07/26/2020   PTSD (post-traumatic stress disorder) 05/30/2016   Alpha thalassemia trait 05/30/2016   Hypertension 05/30/2016   Rotator cuff tendonitis, left 05/30/2016   Osteoarthritis of left knee 05/30/2016   Carpal tunnel syndrome 05/30/2016   Prediabetes 05/30/2016   Onychomycosis 05/30/2016    REFERRING DIAG: Z96.651 (ICD-10-CM) - Hx of total knee arthroplasty, right   THERAPY DIAG:  Right knee pain, unspecified chronicity  Stiffness of right knee, not elsewhere classified  Other abnormalities of gait and mobility  Muscle weakness (generalized)  Rationale for Evaluation and Treatment Rehabilitation  PERTINENT HISTORY: HTN, recent DVT on anticoagulant, GERD, TKA 11/29/21 on RLE, prior L TKA   PRECAUTIONS: Knee and Fall, recent DVT on anticoagulation   ONSET DATE: Oct 5th 2023   SUBJECTIVE:  SUBJECTIVE STATEMENT:    Pt arrives w/o pain, states she was a little sore this morning but felt fine after last session.   PAIN:  Are you having pain: 0/10 Location: Rt knee How would you describe your pain? :poking Aggravating factors: occasionally with movement  Easing factors: medication, rest, ice    OBJECTIVE: (objective measures completed at initial evaluation unless otherwise dated)   PATIENT SURVEYS:  FOTO 33 01/14/22: 30% 01/29/22: 37% function  02/26/22 43%    COGNITION: Overall cognitive status: Within functional limits for tasks assessed                         SENSATION: Light touch intact B LE although pt reports subjective numbness along lateral portion of incision   EDEMA/INSPECTION:  Circumferential: 38cm L joint line, 39.6 cm R  joint line  12cm distal to joint line 38cm RLE, 38.5cm LLE Incision still has steri strips on distal half, although visible portion appears to be healing well     PALPATION: Palpable tightness quad/TFL and hamstrings on operative limb   LOWER EXTREMITY ROM:   Active ROM Right eval Left eval Right 01/14/22 01/29/22 Right 02/05/22 Right  02/26/22 Right    Knee flexion 106 118 106 111 115 117 deg  Knee extension -2 0    0   Ankle dorsiflexion          Ankle plantarflexion          Ankle inversion          Ankle eversion           (Blank rows = not tested)   LOWER EXTREMITY MMT:   MMT Right eval Left eval  Hip flexion      Hip extension      Hip abduction      Hip adduction      Hip internal rotation      Hip external rotation      Knee flexion   WFL  Knee extension   WFL  Ankle dorsiflexion      Ankle plantarflexion      Ankle inversion      Ankle eversion       (Blank rows = not tested) Comments: operative limb not tested given recency of DVT      FUNCTIONAL TESTS:  STS: standard chair, heavy UE support, weight shift to L, stooped posture prior to initiating upright on rollator   Will plan to test TUG vs 5xSTS next session 01/02/2022: TUG: 28 seconds 5xSTS: 28 seconds  02/26/22:  5xSTS 15sec lowest mat no UE support   TUG 11 sec w/ gentle UE support     GAIT: Distance walked: within clinic Assistive device utilized: rollator Level of assistance: Modified independence Comments: reduced stance on R, reduced knee ROM on R, reduced hip extension B, increased lateral weight shifting     TODAY'S TREATMENT:  [facility] Adult PT Treatment:                                                DATE: 03/07/22 Therapeutic Exercise: Recumbent bike 45min full revolutions SL knee ext machine 5# 2x10 each LE SL knee flex machine 15# 2x10 each LE [employer] leg press level 1 + extra plate 2x12 cues for form and setup 6inch step overs 2x8 RLE cues for eccentric speed  Standing TKE ball at  wall, 2x15 emphasis on eccentric control  Modalities: Cold pack anterior/posterior knee, 10 min supine w/ bolster, no adverse events (time not billed)   [facility] Adult PT Treatment:                                                DATE: 03/05/2022 Therapeutic Exercise: Recumbent bike 29min full revolutions during subjective Single leg knee ext machine B 5# 2x10 Single leg hamstring curl machine B 15# 2x10 4 inch lateral step ups + GTB TKE 2x10 2 inch step downs 2x10 cues for control SLS 2 bouts to fatigue, longest ~30sec with touchdown support as needed, CGA BW squat w/ B UE support at cable column, 2x10 cues for improved symmetry of WB Bridge + ball squeeze 2x10 cues for form  SLR 2x10 BIL Modalities: Cold pack anterior/posterior knee, 10 min supine w/ bolster, no adverse events  [facility] Adult PT Treatment:                                                DATE: 02/28/22 Therapeutic Exercise: Recumbent bike 39min full revolutions during subjective Single leg knee ext machine B 5# 2x8 Single leg hamstring curl machine B 15# 2x8 4 inch lateral step ups + GTB TKE 2x10 2 inch step downs 2x10 cues for control SLS 3 bouts to fatigue, longest ~20sec with touchdown support as needed, CGA BW squat w/ B UE support at cable column, 2x12 cues for improved symmetry of WB Bridge + ball squeeze 2x10 cues for form   Modalities: Cold pack anterior/posterior knee, 10 min supine w/ bolster, no adverse events     PATIENT EDUCATION:  Education details: rationale for interventions Person educated: Patient Education method: Consulting civil engineer, demo, cues Education comprehension: verbalized understanding, returned demo, cues    HOME EXERCISE PROGRAM: Access Code: [email or web address] URL: [email or web address] Date: 02/13/2022 Prepared by: Enis Slipper  Exercises - Small Range Straight Leg Raise  - 1 x daily - 7 x weekly - 3 sets - 5 reps - Supine Bridge  - 1 x daily - 7 x weekly - 3 sets - 10 reps - Standing  Hip Abduction with Resistance at Ankles and Counter Support  - 1 x daily - 7 x weekly - 2 sets - 15 reps - Standing Hip Extension with Resistance at Ankles and Counter Support  - 1 x daily - 7 x weekly - 2 sets - 15 reps - Mini Squat with Counter Support  - 1 x daily - 7 x weekly - 2 sets - 10 reps   ASSESSMENT:   CLINICAL IMPRESSION: Pt arrives w/o pain, primary complaint of stiffness. Continues to tolerate activity well, primary focus on eccentric quad control in functional positions as pt does well with functional activity but demonstrates use of compensatory momentum with eccentric movements. Does exhibit muscular fatigue with activity but no increase in resting pain. Cues as above. Ice pack at end of session with pt report of good relief from fatigue. No adverse events, no pain on departure. Pt departs today's session in no acute distress, all voiced questions/concerns addressed appropriately from PT perspective.     OBJECTIVE IMPAIRMENTS: Abnormal gait, decreased activity tolerance, decreased balance, decreased endurance, decreased knowledge  of use of DME, decreased mobility, difficulty walking, decreased ROM, decreased strength, hypomobility, increased edema, impaired flexibility, and pain.    ACTIVITY LIMITATIONS: carrying, lifting, bending, sitting, standing, squatting, stairs, transfers, bathing, toileting, and locomotion level   PARTICIPATION LIMITATIONS: meal prep, cleaning, laundry, driving, shopping, and community activity   PERSONAL FACTORS: Age and 1-2 comorbidities: HTN, DVT  are also affecting patient's functional outcome.    REHAB POTENTIAL: Good   CLINICAL DECISION MAKING: Evolving/moderate complexity   EVALUATION COMPLEXITY: Moderate     GOALS: Goals reviewed with patient? No   SHORT TERM GOALS: Target date: 01/22/2022             Pt will demonstrate appropriate understanding and performance of initially prescribed HEP in order to facilitate improved independence  with management of symptoms.  Baseline: HEP provided on eval Goal status: MET Pt reports adherence 01/24/22   2. Pt will score greater than or equal to 40 on FOTO in order to demonstrate improved perception of function due to symptoms.            Baseline: 33 01/14/22: 30% 02/26/22: 43%             Goal status: MET   LONG TERM GOALS: Target date: 03/29/2022 (updated 02/26/22)     Pt will score 55 or greater on FOTO in order to demonstrate improved perception of function due to symptoms.  Baseline: 33 02/26/22: 43%  Goal status: ongoing    2.  Pt will demonstrate at least 120 degrees of R knee flexion AROM in order to facilitate improved tolerance to functional movements such as bending/squatting.  Baseline: see ROM chart above 02/26/22: 117 deg Goal status: ongoing    3.  Pt will be able to lift up to 10# with less than 2 pt increase in pain on NPS in order to demonstrate improved capacity for daily activities such as laundry/dishes.  Baseline: NT given difficulty with transfer as described above 01/29/22: progressing lifting as able.  02/26/22: 5# KB pickup from floor 5 repetitions no pain  Goal status: ongoing    4.  Pt will report/demonstrate ability to navigate up to 15 stairs safely in order to promote improved safety w/ home navigation.             Baseline: difficulty/pain with navigating stairs, requires rail  Status 01/29/22: utilizes [facility] for stair navigation.   02/26/21: pt reports significant increase in pain particularly with descending stairs            Goal status: ongoing  5. Pt will be able to perform 5xSTS in 12 sec or less to indicate reduced fall risk and increased functional mobility.   Status: 02/26/22 15sec  Goal status: initial     PLAN (updated 02/26/22):   PT FREQUENCY: 2x/week   PT DURATION: 4 weeks   PLANNED INTERVENTIONS: Therapeutic exercises, Therapeutic activity, Neuromuscular re-education, Balance training, Gait training, Patient/Family education, Self Care,  Joint mobilization, Stair training, DME instructions, Aquatic Therapy, Dry Needling, Electrical stimulation, Cryotherapy, Moist heat, Taping, Manual therapy, and Re-evaluation   PLAN FOR NEXT SESSION:   Continue LE strengthening as able/appropriate. Emphasis on endurance and stair navigation  Ashley Murrain PT, DPT 03/07/2022 5:25 PM

## 2022-03-07 ENCOUNTER — Ambulatory Visit: Payer: Medicare Other | Admitting: Physical Therapy

## 2022-03-07 ENCOUNTER — Encounter: Payer: Self-pay | Admitting: Physical Therapy

## 2022-03-07 DIAGNOSIS — M25661 Stiffness of right knee, not elsewhere classified: Secondary | ICD-10-CM

## 2022-03-07 DIAGNOSIS — R2689 Other abnormalities of gait and mobility: Secondary | ICD-10-CM

## 2022-03-07 DIAGNOSIS — M25561 Pain in right knee: Secondary | ICD-10-CM

## 2022-03-07 DIAGNOSIS — M6281 Muscle weakness (generalized): Secondary | ICD-10-CM | POA: Diagnosis not present

## 2022-03-11 NOTE — Therapy (Signed)
OUTPATIENT PHYSICAL THERAPY TREATMENT NOTE     Patient Name: Cheryl Prince MRN: [id number] DOB:[birth year], 67 y.o., female Today's Date: 03/12/2022  PCP: Elsie Stain, MD  REFERRING PROVIDER: Donella Stade, PA-C   END OF SESSION:   PT End of Session - 03/12/22 1322     Visit Number 20    Number of Visits 24    Date for PT Re-Evaluation 04/23/22    Authorization Type [employer] Medicare    Progress Note Due on Visit 26    PT Start Time 1323    PT Stop Time 1415   time not billed for ice   PT Time Calculation (min) 52 min    Activity Tolerance Patient tolerated treatment well;No increased pain    Behavior During Therapy WFL for tasks assessed/performed                Past Medical History:  Diagnosis Date   Allergy    Anemia    Anxiety    Arthritis    Asthma    borderline bronchitis   Bronchitis    GERD (gastroesophageal reflux disease)    Goiter, toxic diffuse 05/30/2016   Hypertension    S/P tonsillectomy 05/30/2016   Sleep apnea    history of, MD tookpt. off machine 7 yrs, ago   Past Surgical History:  Procedure Laterality Date   ABDOMINAL HYSTERECTOMY     adnoides     BREAST SURGERY     CHOLECYSTECTOMY     COLONOSCOPY     DILATION AND CURETTAGE OF UTERUS     GASTRIC BYPASS  1998   IUD REMOVAL     TONSILLECTOMY     TOTAL KNEE ARTHROPLASTY Left 09/22/2018   TOTAL KNEE ARTHROPLASTY Left 09/22/2018   Procedure: LEFT TOTAL KNEE ARTHROPLASTY;  Surgeon: Meredith Pel, MD;  Location: [place];  Service: Orthopedics;  Laterality: Left;   TOTAL KNEE ARTHROPLASTY Right 11/29/2021   Procedure: RIGHT TOTAL KNEE ARTHROPLASTY;  Surgeon: Meredith Pel, MD;  Location: [place];  Service: Orthopedics;  Laterality: Right;   Patient Active Problem List   Diagnosis Date Noted   S/P total knee arthroplasty, right 12/26/2021   Deep venous thrombosis ([place]) 12/26/2021   Arthritis of knee 11/29/2021   Bipolar I disorder ([place]) 07/26/2020   Right hip pain  07/26/2020   Bronchitis 07/26/2020   Allergies 07/26/2020   GERD (gastroesophageal reflux disease) 07/26/2020   At risk for decreased bone density 07/26/2020   PTSD (post-traumatic stress disorder) 05/30/2016   Alpha thalassemia trait 05/30/2016   Hypertension 05/30/2016   Rotator cuff tendonitis, left 05/30/2016   Osteoarthritis of left knee 05/30/2016   Carpal tunnel syndrome 05/30/2016   Prediabetes 05/30/2016   Onychomycosis 05/30/2016    REFERRING DIAG: Z96.651 (ICD-10-CM) - Hx of total knee arthroplasty, right   THERAPY DIAG:  Right knee pain, unspecified chronicity  Stiffness of right knee, not elsewhere classified  Other abnormalities of gait and mobility  Muscle weakness (generalized)  Rationale for Evaluation and Treatment Rehabilitation  PERTINENT HISTORY: HTN, recent DVT on anticoagulant, GERD, TKA 11/29/21 on RLE, prior L TKA   PRECAUTIONS: Knee and Fall, recent DVT on anticoagulation   ONSET DATE: Oct 5th 2023   SUBJECTIVE:  SUBJECTIVE STATEMENT:    Pt arrives w/o pain, primary report of stiffness and some soreness she attributes to weather. Has been increasing community ambulation gradually, still reports difficulty w/ stairs   PAIN:  Are you having pain: 0/10 Location: Rt knee How would you describe your pain? :poking Aggravating factors: occasionally with movement  Easing factors: medication, rest, ice    OBJECTIVE: (objective measures completed at initial evaluation unless otherwise dated)   PATIENT SURVEYS:  FOTO 33 01/14/22: 30% 01/29/22: 37% function  02/26/22 43%    COGNITION: Overall cognitive status: Within functional limits for tasks assessed                         SENSATION: Light touch intact B LE although pt reports subjective numbness along lateral  portion of incision   EDEMA/INSPECTION:  Circumferential: 38cm L joint line, 39.6 cm R joint line  12cm distal to joint line 38cm RLE, 38.5cm LLE Incision still has steri strips on distal half, although visible portion appears to be healing well     PALPATION: Palpable tightness quad/TFL and hamstrings on operative limb   LOWER EXTREMITY ROM:   Active ROM Right eval Left eval Right 01/14/22 01/29/22 Right 02/05/22 Right  02/26/22 Right    Knee flexion 106 118 106 111 115 117 deg  Knee extension -2 0    0   Ankle dorsiflexion          Ankle plantarflexion          Ankle inversion          Ankle eversion           (Blank rows = not tested)   LOWER EXTREMITY MMT:   MMT Right eval Left eval  Hip flexion      Hip extension      Hip abduction      Hip adduction      Hip internal rotation      Hip external rotation      Knee flexion   WFL  Knee extension   WFL  Ankle dorsiflexion      Ankle plantarflexion      Ankle inversion      Ankle eversion       (Blank rows = not tested) Comments: operative limb not tested given recency of DVT      FUNCTIONAL TESTS:  STS: standard chair, heavy UE support, weight shift to L, stooped posture prior to initiating upright on rollator   Will plan to test TUG vs 5xSTS next session 01/02/2022: TUG: 28 seconds 5xSTS: 28 seconds  02/26/22:  5xSTS 15sec lowest mat no UE support   TUG 11 sec w/ gentle UE support     GAIT: Distance walked: within clinic Assistive device utilized: rollator Level of assistance: Modified independence Comments: reduced stance on R, reduced knee ROM on R, reduced hip extension B, increased lateral weight shifting     TODAY'S TREATMENT:  [facility] Adult PT Treatment:                                                DATE: 03/12/22 Therapeutic Exercise: Recumbent bike 59min Fwd step up 8inch 2x8 cues for form and control Lateral step up 6inch 2x8 cues for form and control 4inch step over 2x12 cues for form and  control 2inch lateral step  down 2x8 each cues for form Black band TKE 2x12 cues for control  STS 3x8 w 8# DB cues for symmetrical WB HEP review, provided w/ black band for TKE at home  Modalities: Cold pack anterior/posterior knee, 10 min supine w/ bolster, no adverse events (time not billed)   [facility] Adult PT Treatment:                                                DATE: 03/07/22 Therapeutic Exercise: Recumbent bike 31min full revolutions SL knee ext machine 5# 2x10 each LE SL knee flex machine 15# 2x10 each LE Galileo leg press level 1 + extra plate 2x12 cues for form and setup 6inch step overs 2x8 RLE cues for eccentric speed Standing TKE ball at wall, 2x15 emphasis on eccentric control  Modalities: Cold pack anterior/posterior knee, 10 min supine w/ bolster, no adverse events (time not billed)   [facility] Adult PT Treatment:                                                DATE: 03/05/2022 Therapeutic Exercise: Recumbent bike 65min full revolutions during subjective Single leg knee ext machine B 5# 2x10 Single leg hamstring curl machine B 15# 2x10 4 inch lateral step ups + GTB TKE 2x10 2 inch step downs 2x10 cues for control SLS 2 bouts to fatigue, longest ~30sec with touchdown support as needed, CGA BW squat w/ B UE support at cable column, 2x10 cues for improved symmetry of WB Bridge + ball squeeze 2x10 cues for form  SLR 2x10 BIL Modalities: Cold pack anterior/posterior knee, 10 min supine w/ bolster, no adverse events     PATIENT EDUCATION:  Education details: rationale for interventions Person educated: Patient Education method: Consulting civil engineer, demo, cues Education comprehension: verbalized understanding, returned demo, cues    HOME EXERCISE PROGRAM: Access Code: [email or web address] URL: [email or web address] Date: 03/12/2022 Prepared by: Enis Slipper  Exercises - Small Range Straight Leg Raise  - 1 x daily - 7 x weekly - 3 sets - 5 reps - Supine Bridge  - 1 x daily  - 7 x weekly - 3 sets - 10 reps - Standing Hip Abduction with Resistance at Ankles and Counter Support  - 1 x daily - 7 x weekly - 2 sets - 15 reps - Standing Hip Extension with Resistance at Ankles and Counter Support  - 1 x daily - 7 x weekly - 2 sets - 15 reps - Mini Squat with Counter Support  - 1 x daily - 7 x weekly - 2 sets - 10 reps - Standing Terminal Knee Extension with Resistance  - 1 x daily - 7 x weekly - 2 sets - 10 reps   ASSESSMENT:   CLINICAL IMPRESSION: Pt arrives w/o pain, today's session focusing primarily on control and endurance with multiplanar stair exercise as pt reports stair navigation remains her primary issue. Pt tolerates well with cues as above, no overt pain. No adverse events, primary report of muscle fatigue and improving stiffness. Ice at end of session, tolerates well without adverse events. Pt departs today's session in no acute distress, all voiced questions/concerns addressed appropriately from PT perspective.      OBJECTIVE IMPAIRMENTS: Abnormal gait, decreased activity  tolerance, decreased balance, decreased endurance, decreased knowledge of use of DME, decreased mobility, difficulty walking, decreased ROM, decreased strength, hypomobility, increased edema, impaired flexibility, and pain.    ACTIVITY LIMITATIONS: carrying, lifting, bending, sitting, standing, squatting, stairs, transfers, bathing, toileting, and locomotion level   PARTICIPATION LIMITATIONS: meal prep, cleaning, laundry, driving, shopping, and community activity   PERSONAL FACTORS: Age and 1-2 comorbidities: HTN, DVT  are also affecting patient's functional outcome.    REHAB POTENTIAL: Good   CLINICAL DECISION MAKING: Evolving/moderate complexity   EVALUATION COMPLEXITY: Moderate     GOALS: Goals reviewed with patient? No   SHORT TERM GOALS: Target date: 01/22/2022             Pt will demonstrate appropriate understanding and performance of initially prescribed HEP in order to  facilitate improved independence with management of symptoms.  Baseline: HEP provided on eval Goal status: MET Pt reports adherence 01/24/22   2. Pt will score greater than or equal to 40 on FOTO in order to demonstrate improved perception of function due to symptoms.            Baseline: 33 01/14/22: 30% 02/26/22: 43%             Goal status: MET   LONG TERM GOALS: Target date: 03/29/2022 (updated 02/26/22)     Pt will score 55 or greater on FOTO in order to demonstrate improved perception of function due to symptoms.  Baseline: 33 02/26/22: 43%  Goal status: ongoing    2.  Pt will demonstrate at least 120 degrees of R knee flexion AROM in order to facilitate improved tolerance to functional movements such as bending/squatting.  Baseline: see ROM chart above 02/26/22: 117 deg Goal status: ongoing    3.  Pt will be able to lift up to 10# with less than 2 pt increase in pain on NPS in order to demonstrate improved capacity for daily activities such as laundry/dishes.  Baseline: NT given difficulty with transfer as described above 01/29/22: progressing lifting as able.  02/26/22: 5# KB pickup from floor 5 repetitions no pain  Goal status: ongoing    4.  Pt will report/demonstrate ability to navigate up to 15 stairs safely in order to promote improved safety w/ home navigation.             Baseline: difficulty/pain with navigating stairs, requires rail  Status 01/29/22: utilizes [facility] for stair navigation.   02/26/21: pt reports significant increase in pain particularly with descending stairs            Goal status: ongoing  5. Pt will be able to perform 5xSTS in 12 sec or less to indicate reduced fall risk and increased functional mobility.   Status: 02/26/22 15sec  Goal status: initial     PLAN (updated 02/26/22):   PT FREQUENCY: 2x/week   PT DURATION: 4 weeks   PLANNED INTERVENTIONS: Therapeutic exercises, Therapeutic activity, Neuromuscular re-education, Balance training, Gait training,  Patient/Family education, Self Care, Joint mobilization, Stair training, DME instructions, Aquatic Therapy, Dry Needling, Electrical stimulation, Cryotherapy, Moist heat, Taping, Manual therapy, and Re-evaluation   PLAN FOR NEXT SESSION:   Continue LE strengthening as able/appropriate. Emphasis on endurance and stair navigation  Ashley Murrain PT, DPT 03/12/2022 2:16 PM

## 2022-03-12 ENCOUNTER — Encounter: Payer: Self-pay | Admitting: Physical Therapy

## 2022-03-12 ENCOUNTER — Ambulatory Visit: Payer: Medicare Other | Admitting: Physical Therapy

## 2022-03-12 DIAGNOSIS — R2689 Other abnormalities of gait and mobility: Secondary | ICD-10-CM | POA: Diagnosis not present

## 2022-03-12 DIAGNOSIS — M25561 Pain in right knee: Secondary | ICD-10-CM | POA: Diagnosis not present

## 2022-03-12 DIAGNOSIS — M6281 Muscle weakness (generalized): Secondary | ICD-10-CM | POA: Diagnosis not present

## 2022-03-12 DIAGNOSIS — M25661 Stiffness of right knee, not elsewhere classified: Secondary | ICD-10-CM | POA: Diagnosis not present

## 2022-03-12 NOTE — Therapy (Signed)
OUTPATIENT PHYSICAL THERAPY TREATMENT NOTE     Patient Name: Cheryl Prince MRN: [id number] DOB:1955-09-18, 67 y.o., female Today's Date: 03/14/2022  PCP: Elsie Stain, MD  REFERRING PROVIDER: Donella Stade, PA-C   END OF SESSION:   PT End of Session - 03/14/22 1148     Visit Number 21    Number of Visits 24    Date for PT Re-Evaluation 04/23/22    Authorization Type [employer] Medicare    Progress Note Due on Visit 26    PT Start Time 1145    PT Stop Time 1238   time not billed for ice   PT Time Calculation (min) 53 min    Activity Tolerance Patient tolerated treatment well;No increased pain    Behavior During Therapy WFL for tasks assessed/performed                 Past Medical History:  Diagnosis Date   Allergy    Anemia    Anxiety    Arthritis    Asthma    borderline bronchitis   Bronchitis    GERD (gastroesophageal reflux disease)    Goiter, toxic diffuse 05/30/2016   Hypertension    S/P tonsillectomy 05/30/2016   Sleep apnea    history of, MD tookpt. off machine 7 yrs, ago   Past Surgical History:  Procedure Laterality Date   ABDOMINAL HYSTERECTOMY     adnoides     BREAST SURGERY     CHOLECYSTECTOMY     COLONOSCOPY     DILATION AND CURETTAGE OF UTERUS     GASTRIC BYPASS  1998   IUD REMOVAL     TONSILLECTOMY     TOTAL KNEE ARTHROPLASTY Left 09/22/2018   TOTAL KNEE ARTHROPLASTY Left 09/22/2018   Procedure: LEFT TOTAL KNEE ARTHROPLASTY;  Surgeon: Meredith Pel, MD;  Location: [place];  Service: Orthopedics;  Laterality: Left;   TOTAL KNEE ARTHROPLASTY Right 11/29/2021   Procedure: RIGHT TOTAL KNEE ARTHROPLASTY;  Surgeon: Meredith Pel, MD;  Location: [place];  Service: Orthopedics;  Laterality: Right;   Patient Active Problem List   Diagnosis Date Noted   S/P total knee arthroplasty, right 12/26/2021   Deep venous thrombosis ([place]) 12/26/2021   Arthritis of knee 11/29/2021   Bipolar I disorder ([place]) 07/26/2020   Right hip pain  07/26/2020   Bronchitis 07/26/2020   Allergies 07/26/2020   GERD (gastroesophageal reflux disease) 07/26/2020   At risk for decreased bone density 07/26/2020   PTSD (post-traumatic stress disorder) 05/30/2016   Alpha thalassemia trait 05/30/2016   Hypertension 05/30/2016   Rotator cuff tendonitis, left 05/30/2016   Osteoarthritis of left knee 05/30/2016   Carpal tunnel syndrome 05/30/2016   Prediabetes 05/30/2016   Onychomycosis 05/30/2016    REFERRING DIAG: Z96.651 (ICD-10-CM) - Hx of total knee arthroplasty, right   THERAPY DIAG:  Right knee pain, unspecified chronicity  Stiffness of right knee, not elsewhere classified  Other abnormalities of gait and mobility  Muscle weakness (generalized)  Rationale for Evaluation and Treatment Rehabilitation  PERTINENT HISTORY: HTN, recent DVT on anticoagulant, GERD, TKA 11/29/21 on RLE, prior L TKA   PRECAUTIONS: Knee and Fall, recent DVT on anticoagulation   ONSET DATE: Oct 5th 2023   SUBJECTIVE:  SUBJECTIVE STATEMENT:    Pt arrives w/o pain, states stair training last time was very helpful and she has been able to navigate stairs w/ less conscious thought, feels better with it. States she thinks she will be ready to discharge next week   PAIN:  Are you having pain: 0/10 Location: Rt knee How would you describe your pain? :poking Aggravating factors: occasionally with movement  Easing factors: medication, rest, ice    OBJECTIVE: (objective measures completed at initial evaluation unless otherwise dated)   PATIENT SURVEYS:  FOTO 33 01/14/22: 30% 01/29/22: 37% function  02/26/22 43%    COGNITION: Overall cognitive status: Within functional limits for tasks assessed                         SENSATION: Light touch intact B LE although pt reports  subjective numbness along lateral portion of incision   EDEMA/INSPECTION:  Circumferential: 38cm L joint line, 39.6 cm R joint line  12cm distal to joint line 38cm RLE, 38.5cm LLE Incision still has steri strips on distal half, although visible portion appears to be healing well     PALPATION: Palpable tightness quad/TFL and hamstrings on operative limb   LOWER EXTREMITY ROM:   Active ROM Right eval Left eval Right 01/14/22 01/29/22 Right 02/05/22 Right  02/26/22 Right    Knee flexion 106 118 106 111 115 117 deg  Knee extension -2 0    0   Ankle dorsiflexion          Ankle plantarflexion          Ankle inversion          Ankle eversion           (Blank rows = not tested)   LOWER EXTREMITY MMT:   MMT Right eval Left eval  Hip flexion      Hip extension      Hip abduction      Hip adduction      Hip internal rotation      Hip external rotation      Knee flexion   WFL  Knee extension   WFL  Ankle dorsiflexion      Ankle plantarflexion      Ankle inversion      Ankle eversion       (Blank rows = not tested) Comments: operative limb not tested given recency of DVT      FUNCTIONAL TESTS:  STS: standard chair, heavy UE support, weight shift to L, stooped posture prior to initiating upright on rollator   Will plan to test TUG vs 5xSTS next session 01/02/2022: TUG: 28 seconds 5xSTS: 28 seconds  02/26/22:  5xSTS 15sec lowest mat no UE support   TUG 11 sec w/ gentle UE support     GAIT: Distance walked: within clinic Assistive device utilized: rollator Level of assistance: Modified independence Comments: reduced stance on R, reduced knee ROM on R, reduced hip extension B, increased lateral weight shifting     TODAY'S TREATMENT:  [facility] Adult PT Treatment:                                                DATE: 03/14/22 Therapeutic Exercise: Recumbent bike during subjective  4inch step up + black band TKE 3x8 cues for form, steadying unilat UE as needed 6inch  stepovers 2x8 cues for form and soft footfall 6inch lateral step up 2x10 cues for form and control  8inch step up fwd, 2x10 cues for form and control  Heel raises x20  Modalities: Cold pack anterior/posterior knee, 10 min supine w/ bolster, no adverse events (time not billed)  [facility] Adult PT Treatment:                                                DATE: 03/12/22 Therapeutic Exercise: Recumbent bike Fwd step up 8inch 2x8 cues for form and control Lateral step up 6inch 2x8 cues for form and control 4inch step over 2x12 cues for form and control 2inch lateral step down 2x8 each cues for form Black band TKE 2x12 cues for control  STS 3x8 w 8# DB cues for symmetrical WB HEP review, provided w/ black band for TKE at home  Modalities: Cold pack anterior/posterior knee, 10 min supine w/ bolster, no adverse events (time not billed)   [facility] Adult PT Treatment:                                                DATE: 03/07/22 Therapeutic Exercise: Recumbent bike full revolutions SL knee ext machine 5# 2x10 each LE SL knee flex machine 15# 2x10 each LE Galileo leg press level 1 + extra plate [id number] cues for form and setup 6inch step overs 2x8 RLE cues for eccentric speed Standing TKE ball at wall, 2x15 emphasis on eccentric control  Modalities: Cold pack anterior/posterior knee, 10 min supine w/ bolster, no adverse events (time not billed)      PATIENT EDUCATION:  Education details: rationale for interventions Person educated: Patient Education method: Explanation, demo, cues Education comprehension: verbalized understanding, returned demo, cues    HOME EXERCISE PROGRAM: Access Code: [email or web address] URL: [email or web address] Date: 03/12/2022 Prepared by: Fransisco Hertz  Exercises - Small Range Straight Leg Raise  - 1 x daily - 7 x weekly - 3 sets - 5 reps - Supine Bridge  - 1 x daily - 7 x weekly - 3 sets - 10 reps - Standing Hip Abduction with Resistance at Ankles  and Counter Support  - 1 x daily - 7 x weekly - 2 sets - 15 reps - Standing Hip Extension with Resistance at Ankles and Counter Support  - 1 x daily - 7 x weekly - 2 sets - 15 reps - Mini Squat with Counter Support  - 1 x daily - 7 x weekly - 2 sets - 10 reps - Standing Terminal Knee Extension with Resistance  - 1 x daily - 7 x weekly - 2 sets - 10 reps   ASSESSMENT:   CLINICAL IMPRESSION: Pt arrives w/o pain, reports good response to last session and improved confidence w/ stair navigation. States she feels she will be ready to d/c next week. Today focusing on progression with stair program, pt tolerates well with program as above, no pain, rest breaks as needed d/t fatigue. Pt requests ice at end of session, reports good response and no adverse events. Pt departs today's session in no acute distress, all voiced questions/concerns addressed appropriately from PT perspective.     OBJECTIVE IMPAIRMENTS: Abnormal gait, decreased activity tolerance, decreased balance,  decreased endurance, decreased knowledge of use of DME, decreased mobility, difficulty walking, decreased ROM, decreased strength, hypomobility, increased edema, impaired flexibility, and pain.    ACTIVITY LIMITATIONS: carrying, lifting, bending, sitting, standing, squatting, stairs, transfers, bathing, toileting, and locomotion level   PARTICIPATION LIMITATIONS: meal prep, cleaning, laundry, driving, shopping, and community activity   PERSONAL FACTORS: Age and 1-2 comorbidities: HTN, DVT  are also affecting patient's functional outcome.    REHAB POTENTIAL: Good   CLINICAL DECISION MAKING: Evolving/moderate complexity   EVALUATION COMPLEXITY: Moderate     GOALS: Goals reviewed with patient? No   SHORT TERM GOALS: Target date: 01/22/2022             Pt will demonstrate appropriate understanding and performance of initially prescribed HEP in order to facilitate improved independence with management of symptoms.  Baseline: HEP  provided on eval Goal status: MET Pt reports adherence 01/24/22   2. Pt will score greater than or equal to 40 on FOTO in order to demonstrate improved perception of function due to symptoms.            Baseline: 33 01/14/22: 30% 02/26/22: 43%             Goal status: MET   LONG TERM GOALS: Target date: 03/29/2022 (updated 02/26/22)     Pt will score 55 or greater on FOTO in order to demonstrate improved perception of function due to symptoms.  Baseline: 33 02/26/22: 43%  Goal status: ongoing    2.  Pt will demonstrate at least 120 degrees of R knee flexion AROM in order to facilitate improved tolerance to functional movements such as bending/squatting.  Baseline: see ROM chart above 02/26/22: 117 deg Goal status: ongoing    3.  Pt will be able to lift up to 10# with less than 2 pt increase in pain on NPS in order to demonstrate improved capacity for daily activities such as laundry/dishes.  Baseline: NT given difficulty with transfer as described above 01/29/22: progressing lifting as able.  02/26/22: 5# KB pickup from floor 5 repetitions no pain  Goal status: ongoing    4.  Pt will report/demonstrate ability to navigate up to 15 stairs safely in order to promote improved safety w/ home navigation.             Baseline: difficulty/pain with navigating stairs, requires rail  Status 01/29/22: utilizes [facility] for stair navigation.   02/26/21: pt reports significant increase in pain particularly with descending stairs            Goal status: ongoing  5. Pt will be able to perform 5xSTS in 12 sec or less to indicate reduced fall risk and increased functional mobility.   Status: 02/26/22 15sec  Goal status: initial     PLAN (updated 02/26/22):   PT FREQUENCY: 2x/week   PT DURATION: 4 weeks   PLANNED INTERVENTIONS: Therapeutic exercises, Therapeutic activity, Neuromuscular re-education, Balance training, Gait training, Patient/Family education, Self Care, Joint mobilization, Stair training, DME  instructions, Aquatic Therapy, Dry Needling, Electrical stimulation, Cryotherapy, Moist heat, Taping, Manual therapy, and Re-evaluation   PLAN FOR NEXT SESSION:   Continue LE strengthening as able/appropriate. Emphasis on endurance and stair navigation  Leeroy Cha PT, DPT 03/14/2022 12:40 PM

## 2022-03-14 ENCOUNTER — Encounter: Payer: Self-pay | Admitting: Physical Therapy

## 2022-03-14 ENCOUNTER — Ambulatory Visit: Payer: Medicare Other | Admitting: Physical Therapy

## 2022-03-14 DIAGNOSIS — M6281 Muscle weakness (generalized): Secondary | ICD-10-CM

## 2022-03-14 DIAGNOSIS — M25661 Stiffness of right knee, not elsewhere classified: Secondary | ICD-10-CM

## 2022-03-14 DIAGNOSIS — R2689 Other abnormalities of gait and mobility: Secondary | ICD-10-CM | POA: Diagnosis not present

## 2022-03-14 DIAGNOSIS — M25561 Pain in right knee: Secondary | ICD-10-CM

## 2022-03-16 ENCOUNTER — Other Ambulatory Visit: Payer: Self-pay

## 2022-03-16 ENCOUNTER — Ambulatory Visit (HOSPITAL_COMMUNITY)
Admission: EM | Admit: 2022-03-16 | Discharge: 2022-03-16 | Disposition: A | Discharge status: Home / Self Care | Payer: Medicare Other | Attending: Emergency Medicine | Admitting: Emergency Medicine

## 2022-03-16 ENCOUNTER — Encounter (HOSPITAL_COMMUNITY): Payer: Self-pay | Admitting: *Deleted

## 2022-03-16 DIAGNOSIS — T781XXA Other adverse food reactions, not elsewhere classified, initial encounter: Secondary | ICD-10-CM

## 2022-03-16 DIAGNOSIS — R21 Rash and other nonspecific skin eruption: Secondary | ICD-10-CM | POA: Diagnosis not present

## 2022-03-16 DIAGNOSIS — L509 Urticaria, unspecified: Secondary | ICD-10-CM

## 2022-03-16 DIAGNOSIS — Z8679 Personal history of other diseases of the circulatory system: Secondary | ICD-10-CM

## 2022-03-16 DIAGNOSIS — L308 Other specified dermatitis: Secondary | ICD-10-CM

## 2022-03-16 DIAGNOSIS — Z760 Encounter for issue of repeat prescription: Secondary | ICD-10-CM

## 2022-03-16 DIAGNOSIS — K219 Gastro-esophageal reflux disease without esophagitis: Secondary | ICD-10-CM

## 2022-03-16 MED ORDER — HYDROXYZINE HCL 25 MG PO TABS: 25.0000 mg | ORAL_TABLET | Freq: Four times a day (QID) | ORAL | 0 refills | Status: AC | PRN

## 2022-03-16 MED ORDER — FAMOTIDINE 20 MG PO TABS: 20.0000 mg | ORAL_TABLET | Freq: Two times a day (BID) | ORAL | 0 refills | Status: DC

## 2022-03-16 MED ORDER — TRIAMCINOLONE ACETONIDE 0.1 % EX CREA: 1.0000 | TOPICAL_CREAM | Freq: Two times a day (BID) | CUTANEOUS | 0 refills | Status: AC

## 2022-03-16 NOTE — ED Triage Notes (Addendum)
Pt has a rash from current blood thinner Eliquis . Pt has rash areas on arms and chest. Pt put alcohol on skin but she still itches.

## 2022-03-16 NOTE — ED Provider Notes (Signed)
[place]    CSN: [id number] Arrival date & time: 03/16/22  1631      History   Chief Complaint Chief Complaint  Patient presents with   Allergic Reaction    HPI Cheryl Prince is a 67 y.o. female.   Cheryl Prince, 67 year old female, presents to urgent with 4 month history of skin rash. Pt states it is because of Eloquis, however after further discussion pt admits to not being on Eloquis, eats wheat products that she is allergic to per pt's report. Pt is using alcohol to skin for treatment, requesting refill on cream(steroid).  The history is provided by the patient. No language interpreter was used.    Past Medical History:  Diagnosis Date   Allergy    Anemia    Anxiety    Arthritis    Asthma    borderline bronchitis   Bronchitis    GERD (gastroesophageal reflux disease)    Goiter, toxic diffuse 05/30/2016   Hypertension    S/P tonsillectomy 05/30/2016   Sleep apnea    history of, MD tookpt. off machine 7 yrs, ago    Patient Active Problem List   Diagnosis Date Noted   Urticaria 03/16/2022   Pruritic dermatitis 03/16/2022   Allergic reaction to food 03/16/2022   Rash and nonspecific skin eruption 03/16/2022   History of hypertension 03/16/2022   S/P total knee arthroplasty, right 12/26/2021   Deep venous thrombosis ([facility]) 12/26/2021   Arthritis of knee 11/29/2021   Bipolar I disorder ([facility]) 07/26/2020   Right hip pain 07/26/2020   Bronchitis 07/26/2020   Allergies 07/26/2020   GERD (gastroesophageal reflux disease) 07/26/2020   At risk for decreased bone density 07/26/2020   PTSD (post-traumatic stress disorder) 05/30/2016   Alpha thalassemia trait 05/30/2016   Hypertension 05/30/2016   Rotator cuff tendonitis, left 05/30/2016   Osteoarthritis of left knee 05/30/2016   Carpal tunnel syndrome 05/30/2016   Prediabetes 05/30/2016   Onychomycosis 05/30/2016    Past Surgical History:  Procedure Laterality Date   ABDOMINAL  HYSTERECTOMY     adnoides     BREAST SURGERY     CHOLECYSTECTOMY     COLONOSCOPY     DILATION AND CURETTAGE OF UTERUS     GASTRIC BYPASS  1998   IUD REMOVAL     TONSILLECTOMY     TOTAL KNEE ARTHROPLASTY Left 09/22/2018   TOTAL KNEE ARTHROPLASTY Left 09/22/2018   Procedure: LEFT TOTAL KNEE ARTHROPLASTY;  Surgeon: Meredith Pel, MD;  Location: [facility];  Service: Orthopedics;  Laterality: Left;   TOTAL KNEE ARTHROPLASTY Right 11/29/2021   Procedure: RIGHT TOTAL KNEE ARTHROPLASTY;  Surgeon: Meredith Pel, MD;  Location: [facility];  Service: Orthopedics;  Laterality: Right;    OB History   No obstetric history on file.      Home Medications    Prior to Admission medications   Medication Sig Start Date End Date Taking? Authorizing Provider  hydrOXYzine (ATARAX) 25 MG tablet Take 1 tablet (25 mg total) by mouth every 6 (six) hours as needed for up to 5 days for itching. 03/16/22 5/40/08 Yes Trianna Lupien, Jeanett Schlein, NP  acetaminophen (TYLENOL) 325 MG tablet Take 1 tablet (325 mg total) by mouth every 8 (eight) hours. Patient taking differently: Take 650 mg by mouth 3 (three) times daily as needed (pain). 05/13/19   Argentina Donovan, PA-C  albuterol (PROAIR HFA) 108 (90 Base) MCG/ACT inhaler Inhale 2 puffs into the lungs every 6 (six) hours as  needed for wheezing or shortness of breath. Patient taking differently: Inhale 2 puffs into the lungs 2 (two) times daily as needed for wheezing or shortness of breath. 10/02/21   Elsie Stain, MD  apixaban (ELIQUIS) 5 MG TABS tablet Take 1 tablet (5 mg total) by mouth 2 (two) times daily. 01/15/22   Elsie Stain, MD  Calcium Carbonate Antacid (TUMS PO) Take 2 tablets by mouth 3 (three) times daily as needed (reflux).    [provider]  CALCIUM PO Take 1 tablet by mouth in the morning and at bedtime.    [provider]  cetirizine (ZYRTEC) 10 MG tablet Take 10 mg by mouth daily.    [provider]  Cholecalciferol  (VITAMIN D3) 50 MCG (2000 UT) CAPS Take 2,000 Units by mouth daily.    [provider]  famotidine (PEPCID) 20 MG tablet Take 1 tablet (20 mg total) by mouth 2 (two) times daily for 7 days. [id number]  Kynadie Yaun, Jeanett Schlein, NP  FLOVENT HFA 110 MCG/ACT inhaler Inhale 2 puffs into the lungs 2 (two) times daily as needed. Patient taking differently: Inhale 2 puffs into the lungs as needed (sickness). 07/26/20   Elsie Stain, MD  fluticasone (FLONASE) 50 MCG/ACT nasal spray Place 1 spray into both nostrils 3 (three) times daily as needed for allergies or rhinitis.    [provider]  gabapentin (NEURONTIN) 300 MG capsule Take 1 capsule (300 mg total) by mouth 2 (two) times daily. Patient taking differently: Take 300 mg by mouth 3 (three) times daily as needed (pain). Takes with Tylenol 10/02/21   Elsie Stain, MD  losartan-hydrochlorothiazide (HYZAAR) 100-25 MG tablet Take 1 tablet by mouth daily. 10/02/21   Elsie Stain, MD  methocarbamol (ROBAXIN) 500 MG tablet Take 500 mg by mouth daily as needed for muscle spasms.    [provider]  montelukast (SINGULAIR) 10 MG tablet TAKE 1 TABLET BY MOUTH  DAILY AS NEEDED FOR  WHEEZING Patient taking differently: Take 10 mg by mouth daily. 10/02/21   Elsie Stain, MD  Multiple Vitamin (MULTIVITAMIN) tablet Take 1 tablet by mouth daily.    [provider]  oxyCODONE (OXY IR/ROXICODONE) 5 MG immediate release tablet Take 1-2 tablets (5-10 mg total) by mouth every 4 (four) hours as needed for moderate pain (pain score 4-6). 11/30/21   Meredith Pel, MD  oxyCODONE-acetaminophen (PERCOCET) 5-325 MG tablet Take 1 tablet by mouth every 6 (six) hours as needed for severe pain. 12/14/21 12/14/22  Magnant, Charles L, PA-C  triamcinolone cream (KENALOG) 0.1 % Apply 1 Application topically 2 (two) times daily for 7 days. To affected area, avoid face [id number]  Dolton Shaker, Jeanett Schlein, NP    Family History Family  History  Problem Relation Age of Onset   Hypertension Father    Diabetes Father    Lupus Daughter    Colon cancer Maternal Aunt    Colon cancer Maternal Uncle        4 mat uncles dx colon ca   Esophageal cancer Neg Hx    Rectal cancer Neg Hx    Stomach cancer Neg Hx     Social History Social History   Tobacco Use   Smoking status: Former    Packs/day: 1.50    Years: 18.00    Total pack years: 27.00    Types: Cigarettes    Quit date: 09/16/1984    Years since quitting: 37.5   Smokeless tobacco: Never   Tobacco  comments:    quit 1986  Vaping Use   Vaping Use: Never used  Substance Use Topics   Alcohol use: Yes    Comment: social   Drug use: No     Allergies   Morphine and related, Penicillins, Pine, Shellfish allergy, Lactose intolerance (gi), Miralax [polyethylene glycol], Nickel, Other, Grapeseed extract [nutritional supplements], Latex, Red dye, Sulfites, and Wheat bran   Review of Systems Review of Systems  Constitutional:  Negative for fever.  Skin:  Positive for rash.  All other systems reviewed and are negative.    Physical Exam Triage Vital Signs ED Triage Vitals  Enc Vitals Group     BP 03/16/22 1823 (!) 158/89     Pulse Rate 03/16/22 1823 76     Resp 03/16/22 1823 20     Temp 03/16/22 1823 98.4 F (36.9 C)     Temp src --      SpO2 03/16/22 1823 98 %     Weight --      Height --      Head Circumference --      Peak Flow --      Pain Score 03/16/22 1821 0     Pain Loc --      Pain Edu? --      Excl. in GC? --    No data found.  Updated Vital Signs BP (!) 158/89   Pulse 76   Temp 98.4 F (36.9 C)   Resp 20   SpO2 98%   Visual Acuity Right Eye Distance:   Left Eye Distance:   Bilateral Distance:    Right Eye Near:   Left Eye Near:    Bilateral Near:     Physical Exam Vitals and nursing note reviewed.  Constitutional:      General: She is not in acute distress.    Appearance: She is well-developed.  HENT:     Head:  Normocephalic and atraumatic.  Eyes:     Conjunctiva/sclera: Conjunctivae normal.  Cardiovascular:     Rate and Rhythm: Normal rate and regular rhythm.     Heart sounds: No murmur heard. Pulmonary:     Effort: Pulmonary effort is normal. No respiratory distress.     Breath sounds: Normal breath sounds.  Abdominal:     Palpations: Abdomen is soft.     Tenderness: There is no abdominal tenderness.  Musculoskeletal:        General: No swelling.     Cervical back: Neck supple.  Skin:    General: Skin is warm and dry.     Capillary Refill: Capillary refill takes less than 2 seconds.     Findings: Rash present. Rash is urticarial.  Neurological:     General: No focal deficit present.     Mental Status: She is alert and oriented to person, place, and time.     GCS: GCS eye subscore is 4. GCS verbal subscore is 5. GCS motor subscore is 6.  Psychiatric:        Mood and Affect: Mood normal.      UC Treatments / Results  Labs (all labs ordered are listed, but only abnormal results are displayed) Labs Reviewed - No data to display  EKG   Radiology No results found.  Procedures Procedures (including critical care time)  Medications Ordered in UC Medications - No data to display  Initial Impression / Assessment and Plan / UC Course  I have reviewed the triage vital signs and the nursing notes.  Pertinent labs & imaging results that were available during my care of the patient were reviewed by me and considered in my medical decision making (see chart for details).     Ddx: Rash, allergic reaction, eczema, urticaria Final Clinical Impressions(s) / UC Diagnoses   Final diagnoses:  Pruritic dermatitis  Allergic reaction to food, initial encounter  Rash and nonspecific skin eruption  History of hypertension     Discharge Instructions      Apply ice pack to areas to reduce swelling and itching,avoid scratching. Take meds as directed. Stop putting alcohol on your skin  as it makes rashes worse and dries out your skin. Heat and hot water makes rashes worse. Please avoid known allergens (wheat etc) or you will continue to break out in rashes/hives. Please follow up with PCP next week, may need to be retested for allergens. Go to ER for shortness of breath or worsening rash    ED Prescriptions     Medication Sig Dispense Auth. Provider   triamcinolone cream (KENALOG) 0.1 % Apply 1 Application topically 2 (two) times daily for 7 days. To affected area, avoid face 15 g Sydni Elizarraraz, NP   famotidine (PEPCID) 20 MG tablet Take 1 tablet (20 mg total) by mouth 2 (two) times daily for 7 days. 14 tablet Ahaan Zobrist, NP   hydrOXYzine (ATARAX) 25 MG tablet Take 1 tablet (25 mg total) by mouth every 6 (six) hours as needed for up to 5 days for itching. 20 tablet [place] Rios, Jeanett Schlein, NP      PDMP not reviewed this encounter.   Tori Milks, NP 00/86/76 [id number]

## 2022-03-16 NOTE — Discharge Instructions (Addendum)
Apply ice pack to areas to reduce swelling and itching,avoid scratching. Take meds as directed. Stop putting alcohol on your skin as it makes rashes worse and dries out your skin. Heat and hot water makes rashes worse. Please avoid known allergens (wheat etc) or you will continue to break out in rashes/hives. Please follow up with PCP next week, may need to be retested for allergens. Go to ER for shortness of breath or worsening rash

## 2022-03-19 NOTE — Therapy (Signed)
OUTPATIENT PHYSICAL THERAPY TREATMENT NOTE     Patient Name: Cheryl Prince MRN: [id number] DOB:[birth year], 67 y.o., female Today's Date: 03/20/2022  PCP: Storm Frisk, MD  REFERRING PROVIDER: Julieanne Cotton, PA-C   END OF SESSION:   PT End of Session - 03/20/22 1145     Visit Number 22    Number of Visits 24    Date for PT Re-Evaluation 04/23/22    Authorization Type [employer]    Progress Note Due on Visit 26    PT Start Time 1145    PT Stop Time 1237   time not billed for ice pack   PT Time Calculation (min) 52 min    Activity Tolerance Patient tolerated treatment well;No increased pain    Behavior During Therapy WFL for tasks assessed/performed                  Past Medical History:  Diagnosis Date   Allergy    Anemia    Anxiety    Arthritis    Asthma    borderline bronchitis   Bronchitis    GERD (gastroesophageal reflux disease)    Goiter, toxic diffuse 05/30/2016   Hypertension    S/P tonsillectomy 05/30/2016   Sleep apnea    history of, MD tookpt. off machine 7 yrs, ago   Past Surgical History:  Procedure Laterality Date   ABDOMINAL HYSTERECTOMY     adnoides     BREAST SURGERY     CHOLECYSTECTOMY     COLONOSCOPY     DILATION AND CURETTAGE OF UTERUS     GASTRIC BYPASS  1998   IUD REMOVAL     TONSILLECTOMY     TOTAL KNEE ARTHROPLASTY Left 09/22/2018   TOTAL KNEE ARTHROPLASTY Left 09/22/2018   Procedure: LEFT TOTAL KNEE ARTHROPLASTY;  Surgeon: Cammy Copa, MD;  Location: [facility];  Service: Orthopedics;  Laterality: Left;   TOTAL KNEE ARTHROPLASTY Right 11/29/2021   Procedure: RIGHT TOTAL KNEE ARTHROPLASTY;  Surgeon: Cammy Copa, MD;  Location: [facility] OR;  Service: Orthopedics;  Laterality: Right;   Patient Active Problem List   Diagnosis Date Noted   Urticaria 03/16/2022   Pruritic dermatitis 03/16/2022   Allergic reaction to food 03/16/2022   Rash and nonspecific skin eruption 03/16/2022   History of hypertension  03/16/2022   S/P total knee arthroplasty, right 12/26/2021   Deep venous thrombosis ([facility]) 12/26/2021   Arthritis of knee 11/29/2021   Bipolar I disorder ([facility]) 07/26/2020   Right hip pain 07/26/2020   Bronchitis 07/26/2020   Allergies 07/26/2020   GERD (gastroesophageal reflux disease) 07/26/2020   At risk for decreased bone density 07/26/2020   PTSD (post-traumatic stress disorder) 05/30/2016   Alpha thalassemia trait 05/30/2016   Hypertension 05/30/2016   Rotator cuff tendonitis, left 05/30/2016   Osteoarthritis of left knee 05/30/2016   Carpal tunnel syndrome 05/30/2016   Prediabetes 05/30/2016   Onychomycosis 05/30/2016    REFERRING DIAG: Z96.651 (ICD-10-CM) - Hx of total knee arthroplasty, right   THERAPY DIAG:  Right knee pain, unspecified chronicity  Stiffness of right knee, not elsewhere classified  Other abnormalities of gait and mobility  Muscle weakness (generalized)  Rationale for Evaluation and Treatment Rehabilitation  PERTINENT HISTORY: HTN, recent DVT on anticoagulant, GERD, TKA 11/29/21 on RLE, prior L TKA   PRECAUTIONS: Knee and Fall, recent DVT on anticoagulation   ONSET DATE: Oct 5th 2023   SUBJECTIVE:  SUBJECTIVE STATEMENT:    Pt arrives w/o issue, continues to do well overall. No new updates, states she feels ready to discharge next visit, noticing less stiffness and improved stair navigation.     PAIN:  Are you having pain: 0/10 Location: Rt knee How would you describe your pain? :poking Aggravating factors: occasionally with movement  Easing factors: medication, rest, ice    OBJECTIVE: (objective measures completed at initial evaluation unless otherwise dated)   PATIENT SURVEYS:  FOTO 33 01/14/22: 30% 01/29/22: 37% function  02/26/22 43%     COGNITION: Overall cognitive status: Within functional limits for tasks assessed                         SENSATION: Light touch intact B LE although pt reports subjective numbness along lateral portion of incision   EDEMA/INSPECTION:  Circumferential: 38cm L joint line, 39.6 cm R joint line  12cm distal to joint line 38cm RLE, 38.5cm LLE Incision still has steri strips on distal half, although visible portion appears to be healing well     PALPATION: Palpable tightness quad/TFL and hamstrings on operative limb   LOWER EXTREMITY ROM:   Active ROM Right eval Left eval Right 01/14/22 01/29/22 Right 02/05/22 Right  02/26/22 Right    Knee flexion 106 118 106 111 115 117 deg  Knee extension -2 0    0   Ankle dorsiflexion          Ankle plantarflexion          Ankle inversion          Ankle eversion           (Blank rows = not tested)   LOWER EXTREMITY MMT:   MMT Right eval Left eval  Hip flexion      Hip extension      Hip abduction      Hip adduction      Hip internal rotation      Hip external rotation      Knee flexion   WFL  Knee extension   WFL  Ankle dorsiflexion      Ankle plantarflexion      Ankle inversion      Ankle eversion       (Blank rows = not tested) Comments: operative limb not tested given recency of DVT      FUNCTIONAL TESTS:  STS: standard chair, heavy UE support, weight shift to L, stooped posture prior to initiating upright on rollator   Will plan to test TUG vs 5xSTS next session 01/02/2022: TUG: 28 seconds 5xSTS: 28 seconds  02/26/22:  5xSTS 15sec lowest mat no UE support   TUG 11 sec w/ gentle UE support     GAIT: Distance walked: within clinic Assistive device utilized: rollator Level of assistance: Modified independence Comments: reduced stance on R, reduced knee ROM on R, reduced hip extension B, increased lateral weight shifting     TODAY'S TREATMENT:  [facility] Adult PT Treatment:                                                 DATE: 03/20/22 Therapeutic Exercise: Recumbent bike 62min during subjective SL knee ext 5# BIL 2x12 cues for setup SL knee curl 15# 2x12 BIL cues for setup and increased ROM 6inch lateral step ups 2x12 BIL  cues for form and control  Hamstring iso 4x20sec hold w swiss ball, seated Bridge + ball squeeze x20 cues for form SLR 3 way (IR/neutral/ER) x10 BIL Modalities: Cold pack anterior/posterior knee, 10 min supine w/ bolster, no adverse events (time not billed)  [facility] Adult PT Treatment:                                                DATE: 03/14/22 Therapeutic Exercise: Recumbent bike 1min during subjective  4inch step up + black band TKE 3x8 cues for form, steadying unilat UE as needed 6inch stepovers 2x8 cues for form and soft footfall 6inch lateral step up 2x10 cues for form and control  8inch step up fwd, 2x10 cues for form and control  Heel raises x20  Modalities: Cold pack anterior/posterior knee, 10 min supine w/ bolster, no adverse events (time not billed)  [facility] Adult PT Treatment:                                                DATE: 03/12/22 Therapeutic Exercise: Recumbent bike 54min Fwd step up 8inch 2x8 cues for form and control Lateral step up 6inch 2x8 cues for form and control 4inch step over 2x12 cues for form and control 2inch lateral step down 2x8 each cues for form Black band TKE 2x12 cues for control  STS 3x8 w 8# DB cues for symmetrical WB HEP review, provided w/ black band for TKE at home  Modalities: Cold pack anterior/posterior knee, 10 min supine w/ bolster, no adverse events (time not billed)   PATIENT EDUCATION:  Education details: rationale for interventions Person educated: Patient Education method: Explanation, demo, cues Education comprehension: verbalized understanding, returned demo, cues    HOME EXERCISE PROGRAM: Access Code: [email or web address] URL: [email or web address] Date: 03/12/2022 Prepared by: Enis Slipper  Exercises - Small  Range Straight Leg Raise  - 1 x daily - 7 x weekly - 3 sets - 5 reps - Supine Bridge  - 1 x daily - 7 x weekly - 3 sets - 10 reps - Standing Hip Abduction with Resistance at Ankles and Counter Support  - 1 x daily - 7 x weekly - 2 sets - 15 reps - Standing Hip Extension with Resistance at Ankles and Counter Support  - 1 x daily - 7 x weekly - 2 sets - 15 reps - Mini Squat with Counter Support  - 1 x daily - 7 x weekly - 2 sets - 10 reps - Standing Terminal Knee Extension with Resistance  - 1 x daily - 7 x weekly - 2 sets - 10 reps   ASSESSMENT:   CLINICAL IMPRESSION: Pt arrives w/o pain, continues to report improvement in stiffness and stair navigation. Today focusing on combination of open and closed chain strengthening with emphasis on control throughout ROM and endurance. Pt tolerates well without adverse event or increase in pain. Ice pack at end of session with good response, no adverse evnt. Pt departs today's session in no acute distress, all voiced questions/concerns addressed appropriately from PT perspective.     OBJECTIVE IMPAIRMENTS: Abnormal gait, decreased activity tolerance, decreased balance, decreased endurance, decreased knowledge of use of DME, decreased mobility, difficulty walking, decreased ROM, decreased strength, hypomobility, increased  edema, impaired flexibility, and pain.    ACTIVITY LIMITATIONS: carrying, lifting, bending, sitting, standing, squatting, stairs, transfers, bathing, toileting, and locomotion level   PARTICIPATION LIMITATIONS: meal prep, cleaning, laundry, driving, shopping, and community activity   PERSONAL FACTORS: Age and 1-2 comorbidities: HTN, DVT  are also affecting patient's functional outcome.    REHAB POTENTIAL: Good   CLINICAL DECISION MAKING: Evolving/moderate complexity   EVALUATION COMPLEXITY: Moderate     GOALS: Goals reviewed with patient? No   SHORT TERM GOALS: Target date: 01/22/2022             Pt will demonstrate appropriate  understanding and performance of initially prescribed HEP in order to facilitate improved independence with management of symptoms.  Baseline: HEP provided on eval Goal status: MET Pt reports adherence 01/24/22   2. Pt will score greater than or equal to 40 on FOTO in order to demonstrate improved perception of function due to symptoms.            Baseline: 33 01/14/22: 30% 02/26/22: 43%             Goal status: MET   LONG TERM GOALS: Target date: 03/29/2022 (updated 02/26/22)     Pt will score 55 or greater on FOTO in order to demonstrate improved perception of function due to symptoms.  Baseline: 33 02/26/22: 43%  Goal status: ongoing    2.  Pt will demonstrate at least 120 degrees of R knee flexion AROM in order to facilitate improved tolerance to functional movements such as bending/squatting.  Baseline: see ROM chart above 02/26/22: 117 deg Goal status: ongoing    3.  Pt will be able to lift up to 10# with less than 2 pt increase in pain on NPS in order to demonstrate improved capacity for daily activities such as laundry/dishes.  Baseline: NT given difficulty with transfer as described above 01/29/22: progressing lifting as able.  02/26/22: 5# KB pickup from floor 5 repetitions no pain  Goal status: ongoing    4.  Pt will report/demonstrate ability to navigate up to 15 stairs safely in order to promote improved safety w/ home navigation.             Baseline: difficulty/pain with navigating stairs, requires rail  Status 01/29/22: utilizes [facility] for stair navigation.   02/26/21: pt reports significant increase in pain particularly with descending stairs            Goal status: ongoing  5. Pt will be able to perform 5xSTS in 12 sec or less to indicate reduced fall risk and increased functional mobility.   Status: 02/26/22 15sec  Goal status: initial     PLAN (updated 02/26/22):   PT FREQUENCY: 2x/week   PT DURATION: 4 weeks   PLANNED INTERVENTIONS: Therapeutic exercises, Therapeutic  activity, Neuromuscular re-education, Balance training, Gait training, Patient/Family education, Self Care, Joint mobilization, Stair training, DME instructions, Aquatic Therapy, Dry Needling, Electrical stimulation, Cryotherapy, Moist heat, Taping, Manual therapy, and Re-evaluation   PLAN FOR NEXT SESSION:   review HEP, plan for tentative discharge  Ashley Murrain PT, DPT 03/20/2022 12:38 PM

## 2022-03-20 ENCOUNTER — Encounter: Payer: Self-pay | Admitting: Physical Therapy

## 2022-03-20 ENCOUNTER — Ambulatory Visit: Payer: Medicare Other | Admitting: Physical Therapy

## 2022-03-20 DIAGNOSIS — M25661 Stiffness of right knee, not elsewhere classified: Secondary | ICD-10-CM

## 2022-03-20 DIAGNOSIS — M25561 Pain in right knee: Secondary | ICD-10-CM | POA: Diagnosis not present

## 2022-03-20 DIAGNOSIS — R2689 Other abnormalities of gait and mobility: Secondary | ICD-10-CM

## 2022-03-20 DIAGNOSIS — M6281 Muscle weakness (generalized): Secondary | ICD-10-CM | POA: Diagnosis not present

## 2022-03-20 NOTE — Therapy (Signed)
OUTPATIENT PHYSICAL THERAPY DISCHARGE SUMMARY   Patient Name: Valkyrie Guardiola MRN: [id number] DOB:1955-12-12, 67 y.o., female Today's Date: 03/22/2022  PCP: Elsie Stain, MD  REFERRING PROVIDER: Donella Stade, PA-C   PHYSICAL THERAPY DISCHARGE SUMMARY  Visits from Start of Care: 23  Current functional level related to goals / functional outcomes: Pt able to perform majority of daily activities without significant pain/discomfort; see below for functional outcomes   Remaining deficits: Muscular fatigue with prolonged activity   Education / Equipment: HEP review, provider follow up, discharge education   Patient agrees to discharge. Patient goals were met. Patient is being discharged due to meeting the stated rehab goals + being pleased with current functional level.    END OF SESSION:   PT End of Session - 03/22/22 1055     Visit Number 23    Number of Visits 24    Date for PT Re-Evaluation 04/23/22    Authorization Type [employer]    PT Start Time 1056    PT Stop Time 1153   time not billed for ice   PT Time Calculation (min) 57 min    Activity Tolerance Patient tolerated treatment well;No increased pain    Behavior During Therapy WFL for tasks assessed/performed               Past Medical History:  Diagnosis Date   Allergy    Anemia    Anxiety    Arthritis    Asthma    borderline bronchitis   Bronchitis    GERD (gastroesophageal reflux disease)    Goiter, toxic diffuse 05/30/2016   Hypertension    S/P tonsillectomy 05/30/2016   Sleep apnea    history of, MD tookpt. off machine 7 yrs, ago   Past Surgical History:  Procedure Laterality Date   ABDOMINAL HYSTERECTOMY     adnoides     BREAST SURGERY     CHOLECYSTECTOMY     COLONOSCOPY     DILATION AND CURETTAGE OF UTERUS     GASTRIC BYPASS  1998   IUD REMOVAL     TONSILLECTOMY     TOTAL KNEE ARTHROPLASTY Left 09/22/2018   TOTAL KNEE ARTHROPLASTY Left 09/22/2018   Procedure: LEFT  TOTAL KNEE ARTHROPLASTY;  Surgeon: Meredith Pel, MD;  Location: [facility];  Service: Orthopedics;  Laterality: Left;   TOTAL KNEE ARTHROPLASTY Right 11/29/2021   Procedure: RIGHT TOTAL KNEE ARTHROPLASTY;  Surgeon: Meredith Pel, MD;  Location: [facility];  Service: Orthopedics;  Laterality: Right;   Patient Active Problem List   Diagnosis Date Noted   Urticaria 03/16/2022   Pruritic dermatitis 03/16/2022   Allergic reaction to food 03/16/2022   Rash and nonspecific skin eruption 03/16/2022   History of hypertension 03/16/2022   S/P total knee arthroplasty, right 12/26/2021   Deep venous thrombosis ([place]) 12/26/2021   Arthritis of knee 11/29/2021   Bipolar I disorder ([place]) 07/26/2020   Right hip pain 07/26/2020   Bronchitis 07/26/2020   Allergies 07/26/2020   GERD (gastroesophageal reflux disease) 07/26/2020   At risk for decreased bone density 07/26/2020   PTSD (post-traumatic stress disorder) 05/30/2016   Alpha thalassemia trait 05/30/2016   Hypertension 05/30/2016   Rotator cuff tendonitis, left 05/30/2016   Osteoarthritis of left knee 05/30/2016   Carpal tunnel syndrome 05/30/2016   Prediabetes 05/30/2016   Onychomycosis 05/30/2016    REFERRING DIAG: Z96.651 (ICD-10-CM) - Hx of total knee arthroplasty, right   THERAPY DIAG:  Right knee pain, unspecified chronicity  Stiffness of right knee, not elsewhere classified  Other abnormalities of gait and mobility  Muscle weakness (generalized)  Rationale for Evaluation and Treatment Rehabilitation  PERTINENT HISTORY: HTN, recent DVT on anticoagulant, GERD, TKA 11/29/21 on RLE, prior L TKA   PRECAUTIONS: Knee and Fall, recent DVT on anticoagulation   ONSET DATE: Oct 5th 2023   SUBJECTIVE:                                                                                                                                                                                      SUBJECTIVE STATEMENT:    Pt arrives w/o pain, states  she continues to improve. Notices improved confidence and comfort with stairs and balance, occasional pain but not typically dependent on activity (notes she feels better when she is moving). Follows up with PCP on February 1st. States she would like to discharge today.    PAIN:  Are you having pain: 0/10 Location: Rt knee How would you describe your pain? :poking Aggravating factors: occasionally with movement  Easing factors: medication, rest, ice    OBJECTIVE: (objective measures completed at initial evaluation unless otherwise dated)   PATIENT SURVEYS:  FOTO 33 01/14/22: 30% 01/29/22: 37% function  02/26/22 43%  03/22/22: 68%    COGNITION: Overall cognitive status: Within functional limits for tasks assessed                         SENSATION: Light touch intact B LE although pt reports subjective numbness along lateral portion of incision   EDEMA/INSPECTION:  Circumferential: 38cm L joint line, 39.6 cm R joint line  12cm distal to joint line 38cm RLE, 38.5cm LLE Incision still has steri strips on distal half, although visible portion appears to be healing well     PALPATION: Palpable tightness quad/TFL and hamstrings on operative limb   LOWER EXTREMITY ROM:   Active ROM Right eval Left eval Right 01/14/22 01/29/22 Right 02/05/22 Right  02/26/22 Right   03/22/22 Right  Knee flexion 106 118 106 111 115 117 deg 122 deg  Knee extension -2 0    0  0  Ankle dorsiflexion           Ankle plantarflexion           Ankle inversion           Ankle eversion            (Blank rows = not tested)        FUNCTIONAL TESTS:  STS: standard chair, heavy UE support, weight shift to L, stooped posture prior to initiating upright on rollator   Will plan  to test TUG vs 5xSTS next session 01/02/2022: TUG: 28 seconds 5xSTS: 28 seconds  02/26/22:  5xSTS 15sec lowest mat no UE support   TUG 11 sec w/ gentle UE support    03/22/22:  5xSTS 12sec standard chair no UE support     GAIT: Distance walked: within clinic Assistive device utilized: rollator Level of assistance: Modified independence Comments: reduced stance on R, reduced knee ROM on R, reduced hip extension B, increased lateral weight shifting     TODAY'S TREATMENT:  [facility] Adult PT Treatment:                                                DATE: 03/22/22 Therapeutic Exercise: Mini squat x10 HEP Banded TKE x10 HEP Hip abduction x10 each LE RTB Hip extension x10 each LE RTB SLR x15 RLE HEP Bridge x10   Therapeutic Activity: Stair assessment + education (19 stairs total) STS 2x5 + education on 5xSTS  DB pickup 8# x5, 10# x1 Education on pacing of activities, monitoring tolerance, follow up with provider  Modalities: Cold pack, supine + bolster 33min no adverse events, R knee (time not billed)   PATIENT EDUCATION:  Education details: rationale for interventions, HEP review, discharge education Person educated: Patient Education method: Explanation, demo, cues, handout Education comprehension: verbalized understanding, returned demo, cues    HOME EXERCISE PROGRAM: Access Code: [email or web address] URL: [email or web address] Date: 03/22/2022 Prepared by: Enis Slipper  Exercises - Small Range Straight Leg Raise  - 1 x daily - 7 x weekly - 3 sets - 5 reps - Supine Bridge  - 1 x daily - 7 x weekly - 3 sets - 10 reps - Standing Hip Abduction with Resistance at Ankles and Counter Support  - 1 x daily - 7 x weekly - 2 sets - 15 reps - Standing Hip Extension with Resistance at Ankles and Counter Support  - 1 x daily - 7 x weekly - 2 sets - 15 reps - Mini Squat with Counter Support  - 1 x daily - 7 x weekly - 2 sets - 10 reps - Standing Terminal Knee Extension with Resistance  - 1 x daily - 7 x weekly - 2 sets - 10 reps   ASSESSMENT:   CLINICAL IMPRESSION: Pt arrives w/o pain, states she feels comfortable discharging, still has occasional pain but is not overtly limited in most daily  activities. Functional assessments (lifting, stairs, 5xSTS) much improved compared to initial scores, 5xSTS no longer indicative of fall risk. Pt performs HEP in clinic without issue, good form and safety. No adverse events, ice pack at end of session with good response. Pt denies any questions/concerns, states she feels confident discharging at this time. Follows up with her PCP next week. Pt departs today's session in no acute distress, all voiced questions/concerns addressed appropriately from PT perspective.    OBJECTIVE IMPAIRMENTS: Abnormal gait, decreased activity tolerance, decreased balance, decreased endurance, decreased knowledge of use of DME, decreased mobility, difficulty walking, decreased ROM, decreased strength, hypomobility, increased edema, impaired flexibility, and pain.    ACTIVITY LIMITATIONS: carrying, lifting, bending, sitting, standing, squatting, stairs, transfers, bathing, toileting, and locomotion level   PARTICIPATION LIMITATIONS: meal prep, cleaning, laundry, driving, shopping, and community activity   PERSONAL FACTORS: Age and 1-2 comorbidities: HTN, DVT  are also affecting patient's functional outcome.    REHAB POTENTIAL: Good  CLINICAL DECISION MAKING: Evolving/moderate complexity   EVALUATION COMPLEXITY: Moderate     GOALS: Goals reviewed with patient? Yes   SHORT TERM GOALS: Target date: 01/22/2022             Pt will demonstrate appropriate understanding and performance of initially prescribed HEP in order to facilitate improved independence with management of symptoms.  Baseline: HEP provided on eval Goal status: MET Pt reports adherence 01/24/22   2. Pt will score greater than or equal to 40 on FOTO in order to demonstrate improved perception of function due to symptoms.            Baseline: 33 01/14/22: 30% 02/26/22: 43%             Goal status: MET   LONG TERM GOALS: Target date: 03/29/2022 (updated 02/26/22)     Pt will score 55 or greater on  FOTO in order to demonstrate improved perception of function due to symptoms.  Baseline: 33 02/26/22: 43%  03/22/22: 68%  Goal status: MET    2.  Pt will demonstrate at least 120 degrees of R knee flexion AROM in order to facilitate improved tolerance to functional movements such as bending/squatting.  Baseline: see ROM chart above 02/26/22: 117 deg 03/22/22: 122 deg  Goal status: MET    3.  Pt will be able to lift up to 10# with less than 2 pt increase in pain on NPS in order to demonstrate improved capacity for daily activities such as laundry/dishes.  Baseline: NT given difficulty with transfer as described above 01/29/22: progressing lifting as able.  02/26/22: 5# KB pickup from floor 5 repetitions no pain 03/22/22: 10# DB pick up 1 repetition no pain but mild strain in knee Goal status: MET    4.  Pt will report/demonstrate ability to navigate up to 15 stairs safely in order to promote improved safety w/ home navigation.             Baseline: difficulty/pain with navigating stairs, requires rail  Status 01/29/22: utilizes [facility] for stair navigation.   02/26/21: pt reports significant increase in pain particularly with descending stairs  03/22/22: 16 stairs w/ B vs unilateral UE support, no instability or pain            Goal status: MET   5. Pt will be able to perform 5xSTS in 12 sec or less to indicate reduced fall risk and increased functional mobility.   Status: 02/26/22 15sec  03/22/22: 12sec  Goal status: MET       PLAN: discharge   PT FREQUENCY: n/a   PT DURATION: n/a   PLANNED INTERVENTIONS: Therapeutic exercises, Therapeutic activity, Neuromuscular re-education, Balance training, Gait training, Patient/Family education, Self Care, Joint mobilization, Stair training, DME instructions, Aquatic Therapy, Dry Needling, Electrical stimulation, Cryotherapy, Moist heat, Taping, Manual therapy, and Re-evaluation   PLAN FOR NEXT SESSION: discharge  Ashley Murrain PT, DPT 03/22/2022 12:37  PM

## 2022-03-22 ENCOUNTER — Encounter: Payer: Self-pay | Admitting: Physical Therapy

## 2022-03-22 ENCOUNTER — Ambulatory Visit: Payer: Medicare Other | Admitting: Physical Therapy

## 2022-03-22 DIAGNOSIS — M25661 Stiffness of right knee, not elsewhere classified: Secondary | ICD-10-CM | POA: Diagnosis not present

## 2022-03-22 DIAGNOSIS — M25561 Pain in right knee: Secondary | ICD-10-CM

## 2022-03-22 DIAGNOSIS — M6281 Muscle weakness (generalized): Secondary | ICD-10-CM

## 2022-03-22 DIAGNOSIS — R2689 Other abnormalities of gait and mobility: Secondary | ICD-10-CM

## 2022-03-27 NOTE — Progress Notes (Unsigned)
Established Patient Office Visit  Subjective   Patient ID: Cheryl Prince, female    DOB: [birth year]  Age: 67 y.o. MRN: [id number]   History of present illness Chief Complaint  Patient presents with   Hypertension    07/26/20 Cheryl CroakPhyllis Denise Prince presents for PCP to f/u    Former [facility] pt. The patient has been experiencing frequent chronic right shoulder and right hip pain for a few months. The right shoulder pain occurs throughout the shoulder area and joint. The pain is worse with movement and improves with keeping the right shoulder still. The patient takes Tylenol and gabapentin for the pain which is helpful. The patient has seen orthopedics in the past for this pain and was told she may need a right shoulder replacement in the future. She also states she saw physical therapy in the past for her shoulder which was helpful. The patient's right hip pain occurs mostly in the anterior and lateral aspect of the right hip. The pain improves with taking Tylenol and gabapentin. The pain is exacerbated by walking on the treadmill. She has seen physical therapy in the past for the right hip pain which was helpful. She would like a referral for physical therapy today.   The patient has a history of hypertension which is well controlled on her current dose of HCTZ.   The patient has chronic bronchitis for which she takes Flovent while experiencing symptoms and albuterol as needed.   The patient has multiple seasonal and food allergies which cause itching. The patient takes hydroxyzine and Singulair for these symptoms.   The patient has daily gastroesophageal reflux symptoms for which she takes Pepcid and Compazine.    The patient's medical record reveals a diagnosis of bipolar 1 disorder, though the patient denies having this diagnosis. She admits to having PTSD from [employer] service.    12/12/2020 Since the last visit in June the patient has been doing better.  She is undergoing  physical therapy and her right hip and left shoulder are improved.  She is joined the [employer] she is doing swimming and other exercises she has a home gym as well she has lost weight she is got an air [place] and trying to improve her diet.  She is requesting a new electric lift chair because she has difficulty rising from a sitting position.  She does have an upcoming DEXA scan and mammogram in December.  Patient has no other real complaints blood pressures been stable.   03/19/21 Since the last visit in October the patient's undergone a great deal of stress and that she lost a cousin in a parent and short periods of time over 2 months.  On arrival blood pressure elevated 176/107.  She is drinking several vodkas a day as well.  Patient is gained some weight and is no longer [facility] as [facility] had [facility].  She is on the hydrochlorothiazide alone 25 mg.  Her asthma is stable at this time on the Flovent and montelukast and as needed albuterol.  Patient declines to receive any vaccines as she has a prior history of hypersensitivity to all vaccines.   Her diet is also not been very compliant and she is eating a lot of carbohydrates and salt foods.  After resting for 10 minutes I rechecked her blood pressure and it was still elevated as previous.   05/02/2021 Patient is seen in return follow-up she does have some chronic right knee pain.  The patient's overall mood  and stress levels have improved.  She is about to travel to [place] to see her brother for his birthday.  She is trying to eat a healthier diet.  She knows she has a mammogram that is coming up in July of this year.  On arrival blood pressure 138/80.   The patient's been careful to watch her sugar and salt intake but does need improvement in this regard.   Patient is compliant with her blood pressure medications.  She remains on the losartan HCT 100/25.   Patient does need refills on her medications.  She does use the Singulair as  needed hydroxyzine as needed Flovent is being used along with Pepcid.  Gabapentin is twice daily as well. 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  She has been working 6 hours a day 3 days a week but no new management wanted to work 5 days a week 6 hours each.  She has significant arthritis in the right knee neck and shoulder.  Because of this she can only work the 3-day week 6-hour day schedule.  She is  needing a letter stating as such.  8/8 Patient seen in return follow-up she has been monitoring blood pressure at home and has written improved numbers today on arrival 110/78 her blood pressures on her home meter are in similar range.  She has a planned total right knee replacement in October.  She is interested in getting PCS services.  Patient has no other complaints at this visit.  03/28/22 Patient seen in return follow-up she is doing very well from her right total knee replacement which occurred in October.  She still has a little neuropathy in the leg.  She has good range of motion.  She did have a DVT post hospital postop and took a course of apixaban she is now off this without symptoms.  Patient does have heartburn symptoms would like this evaluated.  Blood pressure on arrival is good 126/87.    Patient Active Problem List   Diagnosis Date Noted   Class 2 severe obesity due to excess calories with serious comorbidity and body mass index (BMI) of 37.0 to 37.9 in adult [facility]) 03/28/2022   Status post total left knee replacement 03/28/2022   Urticaria 03/16/2022   Pruritic dermatitis 03/16/2022   Allergic reaction to food 03/16/2022   S/P total knee arthroplasty, right 12/26/2021    Deep venous thrombosis ([place]) 12/26/2021   Right hip pain 07/26/2020   Bronchitis 07/26/2020   Allergies 07/26/2020   GERD (gastroesophageal reflux disease) 07/26/2020   At risk for decreased bone density 07/26/2020   PTSD (post-traumatic stress disorder) 05/30/2016   Alpha thalassemia trait 05/30/2016   Hypertension 05/30/2016   Rotator cuff tendonitis, left 05/30/2016   Carpal tunnel syndrome 05/30/2016   Prediabetes 05/30/2016   Onychomycosis 05/30/2016   Past Medical History:  Diagnosis Date   Allergy    Anemia    Anxiety    Arthritis    Asthma    borderline bronchitis   Bipolar I disorder ([place]) 07/26/2020   Bronchitis    GERD (gastroesophageal reflux disease)    Goiter, toxic diffuse 05/30/2016   Hypertension  S/P tonsillectomy 05/30/2016   Sleep apnea    history of, MD tookpt. off machine 7 yrs, ago   Past Surgical History:  Procedure Laterality Date   ABDOMINAL HYSTERECTOMY     adnoides     BREAST SURGERY     CHOLECYSTECTOMY     COLONOSCOPY     DILATION AND CURETTAGE OF UTERUS     GASTRIC BYPASS  1998   IUD REMOVAL     TONSILLECTOMY     TOTAL KNEE ARTHROPLASTY Left 09/22/2018   TOTAL KNEE ARTHROPLASTY Left 09/22/2018   Procedure: LEFT TOTAL KNEE ARTHROPLASTY;  Surgeon: Cammy Copa, MD;  Location: [facility] OR;  Service: Orthopedics;  Laterality: Left;   TOTAL KNEE ARTHROPLASTY Right 11/29/2021   Procedure: RIGHT TOTAL KNEE ARTHROPLASTY;  Surgeon: Cammy Copa, MD;  Location: [facility] OR;  Service: Orthopedics;  Laterality: Right;   Social History   Tobacco Use   Smoking status: Former    Packs/day: 1.50    Years: 18.00    Total pack years: 27.00    Types: Cigarettes    Quit date: 09/16/1984    Years since quitting: 37.5   Smokeless tobacco: Never   Tobacco comments:    quit 1986  Vaping Use   Vaping Use: Never used  Substance Use Topics   Alcohol use: Yes    Comment: social   Drug use: No   Social History   Socioeconomic History   Marital  status: Single    Spouse name: Not on file   Number of children: Not on file   Years of education: Not on file   Highest education level: Not on file  Occupational History   Not on file  Tobacco Use   Smoking status: Former    Packs/day: 1.50    Years: 18.00    Total pack years: 27.00    Types: Cigarettes    Quit date: 09/16/1984    Years since quitting: 37.5   Smokeless tobacco: Never   Tobacco comments:    quit 1986  Vaping Use   Vaping Use: Never used  Substance and Sexual Activity   Alcohol use: Yes    Comment: social   Drug use: No   Sexual activity: Not on file  Other Topics Concern   Not on file  Social History Narrative   Not on file   Social Determinants of Health   Financial Resource Strain: Not on file  Food Insecurity: Unknown (11/30/2021)   Hunger Vital Sign    Worried About Running Out of Food in the Last Year: Patient refused    Ran Out of Food in the Last Year: Patient refused  Transportation Needs: Unknown (11/30/2021)   PRAPARE - Administrator, Civil Service (Medical): Patient refused    Lack of Transportation (Non-Medical): Patient refused  Physical Activity: Not on file  Stress: Not on file  Social Connections: Not on file  Intimate Partner Violence: Unknown (11/30/2021)   Humiliation, Afraid, Rape, and Kick questionnaire    Fear of Current or Ex-Partner: Patient refused    Emotionally Abused: Patient refused    Physically Abused: Patient refused    Sexually Abused: Patient refused   Family Status  Relation Name Status   Mother  Deceased   Father  Alive       per pt father have a Pacemaker   Sister  Other       Carrier of Sickle Cell Anemia   Daughter  Chemical engineer  Alive   Administrator  (Not Specified)   Neg Hx  (Not Specified)   Family History  Problem Relation Age of Onset   Hypertension Father    Diabetes Father    Lupus Daughter    Colon cancer Maternal Aunt    Colon cancer Maternal Uncle        4 mat uncles dx  colon ca   Esophageal cancer Neg Hx    Rectal cancer Neg Hx    Stomach cancer Neg Hx    Allergies  Allergen Reactions   Morphine And Related Diarrhea, Nausea And Vomiting and Swelling   Penicillins Swelling and Other (See Comments)    Comatose for 2 days    Pine Shortness Of Breath   Shellfish Allergy Shortness Of Breath and Swelling   Lactose Intolerance (Gi) Other (See Comments)    GI upset   Miralax [Polyethylene Glycol] Itching   Nickel Other (See Comments)    Pt states she gets abscess from the earrings with nickel    Other Itching and Nausea And Vomiting    Mushrooms - feels high, and dizzy    Grapeseed Extract [Nutritional Supplements] Rash    grapes   Latex Itching and Rash    "IF" condom, it makes it painful   Red Dye Swelling and Rash    The dye for checking thyroid.  Red Cast Dye   Sulfites Hives and Rash    Tingling in mouth   Wheat Bran Rash and Other (See Comments)     tingling in mouth, vomiting      Review of Systems  Constitutional:  Negative for chills, diaphoresis, fever, malaise/fatigue and weight loss.  HENT:  Negative for congestion, hearing loss, nosebleeds, sore throat and tinnitus.   Eyes:  Negative for blurred vision, photophobia and redness.  Respiratory:  Negative for cough, hemoptysis, sputum production, shortness of breath, wheezing and stridor.   Cardiovascular:  Negative for chest pain, palpitations, orthopnea, claudication, leg swelling and PND.  Gastrointestinal:  Negative for abdominal pain, blood in stool, constipation, diarrhea, heartburn, nausea and vomiting.  Genitourinary:  Negative for dysuria, flank pain, frequency, hematuria and urgency.  Musculoskeletal:  Positive for joint pain. Negative for back pain, falls, myalgias and neck pain.  Skin:  Negative for itching and rash.  Neurological:  Negative for dizziness, tingling, tremors, sensory change, speech change, focal weakness, seizures, loss of consciousness, weakness and  headaches.  Endo/Heme/Allergies:  Negative for environmental allergies and polydipsia. Does not bruise/bleed easily.  Psychiatric/Behavioral:  Negative for depression, memory loss, substance abuse and suicidal ideas. The patient is not nervous/anxious and does not have insomnia.       Objective:     BP 126/87   Pulse (!) 52   Wt 220 lb 12.8 oz (100.2 kg)   SpO2 98%   BMI 39.11 kg/m  BP Readings from Last 3 Encounters:  03/28/22 126/87  03/16/22 (!) 158/89  12/03/21 111/65   Wt Readings from Last 3 Encounters:  03/28/22 220 lb 12.8 oz (100.2 kg)  11/29/21 225 lb (102.1 kg)  11/20/21 225 lb 1.6 oz (102.1 kg)      Physical Exam Vitals reviewed.  Constitutional:      Appearance: Normal appearance. She is well-developed. She is obese. She is not diaphoretic.  HENT:     Head: Normocephalic and atraumatic.     Nose: No nasal deformity, septal deviation, mucosal edema or rhinorrhea.     Right Sinus: No maxillary sinus tenderness or frontal sinus  tenderness.     Left Sinus: No maxillary sinus tenderness or frontal sinus tenderness.     Mouth/Throat:     Pharynx: No oropharyngeal exudate.  Eyes:     General: No scleral icterus.    Conjunctiva/sclera: Conjunctivae normal.     Pupils: Pupils are equal, round, and reactive to light.  Neck:     Thyroid: No thyromegaly.     Vascular: No carotid bruit or JVD.     Trachea: Trachea normal. No tracheal tenderness or tracheal deviation.  Cardiovascular:     Rate and Rhythm: Normal rate and regular rhythm.     Chest Wall: PMI is not displaced.     Pulses: Normal pulses. No decreased pulses.     Heart sounds: Normal heart sounds, S1 normal and S2 normal. Heart sounds not distant. No murmur heard.    No systolic murmur is present.     No diastolic murmur is present.     No friction rub. No gallop. No S3 or S4 sounds.  Pulmonary:     Effort: No tachypnea, accessory muscle usage or respiratory distress.     Breath sounds: No stridor.  No decreased breath sounds, wheezing, rhonchi or rales.  Chest:     Chest wall: No tenderness.  Abdominal:     General: Bowel sounds are normal. There is no distension.     Palpations: Abdomen is soft. Abdomen is not rigid.     Tenderness: There is no abdominal tenderness. There is no guarding or rebound.  Musculoskeletal:        General: No deformity. Normal range of motion.     Cervical back: Normal range of motion and neck supple. No edema, erythema or rigidity. No muscular tenderness. Normal range of motion.     Comments: Wound healed up on the right total joint replacement of the knee and good range of motion  Lymphadenopathy:     Head:     Right side of head: No submental or submandibular adenopathy.     Left side of head: No submental or submandibular adenopathy.     Cervical: No cervical adenopathy.  Skin:    General: Skin is warm and dry.     Coloration: Skin is not pale.     Findings: No rash.     Nails: There is no clubbing.  Neurological:     Mental Status: She is alert and oriented to person, place, and time.     Sensory: No sensory deficit.  Psychiatric:        Speech: Speech normal.        Behavior: Behavior normal.    No exam this is a phone visit  No results found for any visits on 03/28/22.  Last CBC Lab Results  Component Value Date   WBC 4.1 11/20/2021   HGB 11.1 (L) 11/20/2021   HCT 35.2 (L) 11/20/2021   MCV 77.2 (L) 11/20/2021   MCH 24.3 (L) 11/20/2021   RDW 15.7 (H) 11/20/2021   PLT 260 85/03/7739   Last metabolic panel Lab Results  Component Value Date   GLUCOSE 98 11/20/2021   NA 135 11/20/2021   K 3.5 11/20/2021   CL 104 11/20/2021   CO2 26 11/20/2021   BUN 9 11/20/2021   CREATININE 0.74 11/20/2021   GFRNONAA >60 11/20/2021   CALCIUM 8.4 (L) 11/20/2021   PROT 7.9 10/02/2021   ALBUMIN 4.6 10/02/2021   LABGLOB 3.3 10/02/2021   AGRATIO 1.4 10/02/2021   BILITOT 0.6 10/02/2021  ALKPHOS 110 10/02/2021   AST 22 10/02/2021   ALT 18  10/02/2021   ANIONGAP 5 11/20/2021   Last lipids Lab Results  Component Value Date   CHOL 187 10/02/2021   HDL 61 10/02/2021   LDLCALC 110 (H) 10/02/2021   TRIG 90 10/02/2021   CHOLHDL 3.1 10/02/2021   Last hemoglobin A1c Lab Results  Component Value Date   HGBA1C 5.7 (H) 10/02/2021   Last thyroid functions Lab Results  Component Value Date   TSH 1.210 12/11/2016   Last vitamin D No results found for: "25OHVITD2", "25OHVITD3", "VD25OH" Last vitamin B12 and Folate No results found for: "VITAMINB12", "FOLATE"    The 10-year ASCVD risk score (Arnett DK, et al., 2019) is: 9.8%    Assessment & Plan:   Problem List Items Addressed This Visit       Cardiovascular and Mediastinum   Hypertension    Excellent control no change in medications      Relevant Medications   losartan-hydrochlorothiazide (HYZAAR) 100-25 MG tablet   Deep venous thrombosis (HCC)    Resolved check d dimer      Relevant Medications   losartan-hydrochlorothiazide (HYZAAR) 100-25 MG tablet   Other Relevant Orders   D-dimer, quantitative     Digestive   GERD (gastroesophageal reflux disease)    Will begin pantoprazole daily      Relevant Medications   pantoprazole (PROTONIX) 40 MG tablet     Other   Prediabetes    History of prediabetes The following Lifestyle Medicine recommendations according to American College of Lifestyle Medicine [facility]) were discussed and offered to patient who agrees to start the journey:  A. Whole Foods, Plant-based plate comprising of fruits and vegetables, plant-based proteins, whole-grain carbohydrates was discussed in detail with the patient.   A list for source of those nutrients were also provided to the patient.  Patient will use only water or unsweetened tea for hydration. B.  The need to stay away from risky substances including alcohol, smoking; obtaining 7 to 9 hours of restorative sleep, at least 150 minutes of moderate intensity exercise weekly, the  importance of healthy social connections,  and stress reduction techniques were discussed. C.  A full color page of  Calorie density of various food groups per pound showing examples of each food groups was provided to the patient.       Class 2 severe obesity due to excess calories with serious comorbidity and body mass index (BMI) of 37.0 to 37.9 in adult [facility]) - Primary    The following Lifestyle Medicine recommendations according to American College of Lifestyle Medicine [facility]) were discussed and offered to patient who agrees to start the journey:  A. Whole Foods, Plant-based plate comprising of fruits and vegetables, plant-based proteins, whole-grain carbohydrates was discussed in detail with the patient.   A list for source of those nutrients were also provided to the patient.  Patient will use only water or unsweetened tea for hydration. B.  The need to stay away from risky substances including alcohol, smoking; obtaining 7 to 9 hours of restorative sleep, at least 150 minutes of moderate intensity exercise weekly, the importance of healthy social connections,  and stress reduction techniques were discussed. C.  A full color page of  Calorie density of various food groups per pound showing examples of each food groups was provided to the patient.       Status post total left knee replacement   RESOLVED: Bipolar I disorder (HCC)  Not active     Follow-up in 5 months

## 2022-03-28 ENCOUNTER — Encounter: Payer: Self-pay | Admitting: Critical Care Medicine

## 2022-03-28 ENCOUNTER — Ambulatory Visit: Payer: Medicare Other | Attending: Critical Care Medicine | Admitting: Critical Care Medicine

## 2022-03-28 VITALS — BP 126/87 | HR 52 | Wt 220.8 lb

## 2022-03-28 DIAGNOSIS — E66812 Obesity, class 2: Secondary | ICD-10-CM

## 2022-03-28 DIAGNOSIS — Z96652 Presence of left artificial knee joint: Secondary | ICD-10-CM | POA: Diagnosis not present

## 2022-03-28 DIAGNOSIS — R7303 Prediabetes: Secondary | ICD-10-CM | POA: Diagnosis not present

## 2022-03-28 DIAGNOSIS — I10 Essential (primary) hypertension: Secondary | ICD-10-CM

## 2022-03-28 DIAGNOSIS — F319 Bipolar disorder, unspecified: Secondary | ICD-10-CM

## 2022-03-28 DIAGNOSIS — I82491 Acute embolism and thrombosis of other specified deep vein of right lower extremity: Secondary | ICD-10-CM | POA: Diagnosis not present

## 2022-03-28 DIAGNOSIS — K219 Gastro-esophageal reflux disease without esophagitis: Secondary | ICD-10-CM | POA: Diagnosis not present

## 2022-03-28 DIAGNOSIS — Z6837 Body mass index (BMI) 37.0-37.9, adult: Secondary | ICD-10-CM

## 2022-03-28 DIAGNOSIS — Z6838 Body mass index (BMI) 38.0-38.9, adult: Secondary | ICD-10-CM | POA: Insufficient documentation

## 2022-03-28 MED ORDER — MONTELUKAST SODIUM 10 MG PO TABS: ORAL_TABLET | ORAL | 3 refills | Status: DC

## 2022-03-28 MED ORDER — LOSARTAN POTASSIUM-HCTZ 100-25 MG PO TABS: 1.0000 | ORAL_TABLET | Freq: Every day | ORAL | 3 refills | Status: DC

## 2022-03-28 MED ORDER — PANTOPRAZOLE SODIUM 40 MG PO TBEC: 40.0000 mg | DELAYED_RELEASE_TABLET | Freq: Every day | ORAL | 3 refills | Status: DC

## 2022-03-28 NOTE — Assessment & Plan Note (Signed)

## 2022-03-28 NOTE — Progress Notes (Signed)
DVT

## 2022-03-28 NOTE — Assessment & Plan Note (Signed)
Excellent control no change in medications

## 2022-03-28 NOTE — Addendum Note (Signed)
Addended by: Asencion Noble E on: 03/28/2022 12:56 PM   Modules accepted: Level of Service

## 2022-03-28 NOTE — Patient Instructions (Signed)
Labs today d dimer for blood clot   Start pantoprazole daily for acid  Refills all other medications sent to pharmacy  Return Dr Joya Gaskins 5 months

## 2022-03-28 NOTE — Assessment & Plan Note (Signed)
Not active 

## 2022-03-28 NOTE — Assessment & Plan Note (Signed)
Will begin pantoprazole daily

## 2022-03-28 NOTE — Assessment & Plan Note (Signed)
Resolved check d dimer

## 2022-03-28 NOTE — Assessment & Plan Note (Signed)
History of prediabetes The following Lifestyle Medicine recommendations according to [facility]) were discussed and offered to patient who agrees to start the journey:  A. Whole Foods, Plant-based plate comprising of fruits and vegetables, plant-based proteins, whole-grain carbohydrates was discussed in detail with the patient.   A list for source of those nutrients were also provided to the patient.  Patient will use only water or unsweetened tea for hydration. B.  The need to stay away from risky substances including alcohol, smoking; obtaining 7 to 9 hours of restorative sleep, at least 150 minutes of moderate intensity exercise weekly, the importance of healthy social connections,  and stress reduction techniques were discussed. C.  A full color page of  Calorie density of various food groups per pound showing examples of each food groups was provided to the patient.

## 2022-03-29 ENCOUNTER — Other Ambulatory Visit: Payer: Self-pay | Admitting: Critical Care Medicine

## 2022-03-29 LAB — D-DIMER, QUANTITATIVE: D-DIMER: 0.56 mg/L FEU — ABNORMAL HIGH (ref 0.00–0.49)

## 2022-03-29 MED ORDER — LIDOCAINE 5 % EX PTCH: 1.0000 | MEDICATED_PATCH | CUTANEOUS | 0 refills | Status: DC

## 2022-04-17 ENCOUNTER — Other Ambulatory Visit: Payer: Self-pay | Admitting: Critical Care Medicine

## 2022-04-17 NOTE — Telephone Encounter (Signed)
Requested Prescriptions  Pending Prescriptions Disp Refills   albuterol (VENTOLIN HFA) 108 (90 Base) MCG/ACT inhaler [Pharmacy Med Name: ALBUTEROL HFA 90MCG/ACT (PA)] 25.5 g 1    Sig: USE 2 INHALATIONS BY MOUTH EVERY 6 HOURS AS NEEDED FOR WHEEZING  OR SHORTNESS OF BREATH     Pulmonology:  Beta Agonists 2 Passed - 04/17/2022  5:21 PM      Passed - Last BP in normal range    BP Readings from Last 1 Encounters:  03/28/22 126/87         Passed - Last Heart Rate in normal range    Pulse Readings from Last 1 Encounters:  03/28/22 (!) 94         Passed - Valid encounter within last 12 months    Recent Outpatient Visits           2 weeks ago Class 2 severe obesity due to excess calories with serious comorbidity and body mass index (BMI) of 37.0 to 37.9 in adult [facility])   [place] Elsie Stain, MD   6 months ago Primary hypertension   [place] Elsie Stain, MD   9 months ago Primary osteoarthritis of left knee   [place] Elsie Stain, MD   11 months ago Medication refill   [place] Elsie Stain, MD   1 year ago Primary hypertension   [place], MD       Future Appointments             In 4 months Joya Gaskins Burnett Harry, MD [place]

## 2022-04-22 ENCOUNTER — Telehealth: Payer: Self-pay | Admitting: Emergency Medicine

## 2022-04-22 ENCOUNTER — Telehealth: Payer: Self-pay

## 2022-04-22 NOTE — Telephone Encounter (Signed)
Patient called in and I spoke with [facility] they stated that patient received the wrong walker and wanted a Rolator walker, I look in chart to find orders but I didn't see any. I verified with [facility] personal to make sure I was in the right persons chart.

## 2022-04-22 NOTE — Telephone Encounter (Signed)
Duplicate message. 

## 2022-04-22 NOTE — Telephone Encounter (Signed)
Ok noted, thank you.

## 2022-04-22 NOTE — Telephone Encounter (Signed)
Copied from [place] [id number]. Topic: General - Other >> Apr 22, 2022  9:17 AM Chapman Fitch wrote: Reason for CRM: The walker that was ordered was the wrong one/ pt needs a rolator walker/ please advise

## 2022-05-01 ENCOUNTER — Ambulatory Visit: Payer: Medicare Other | Attending: Critical Care Medicine

## 2022-05-01 ENCOUNTER — Telehealth: Payer: Self-pay | Admitting: Critical Care Medicine

## 2022-05-01 VITALS — Ht 63.0 in | Wt 217.0 lb

## 2022-05-01 DIAGNOSIS — Z0000 Encounter for general adult medical examination without abnormal findings: Secondary | ICD-10-CM

## 2022-05-01 NOTE — Progress Notes (Signed)
I connected with  Cheryl Prince on 05/01/22 by a audio enabled telemedicine application and verified that I am speaking with the correct person using two identifiers.  Patient Location: Home  Provider Location: Office/Clinic  I discussed the limitations of evaluation and management by telemedicine. The patient expressed understanding and agreed to proceed.  Subjective:   Cheryl Prince is a 67 y.o. female who presents for Medicare Annual (Subsequent) preventive examination.  Review of Systems     Cardiac Risk Factors include: advanced age (>73mn, >>12women);hypertension;obesity (BMI >30kg/m2)     Objective:    Today's Vitals   05/01/22 1126 05/01/22 1127  Weight: 217 lb (98.4 kg)   Height: '5\' 3"'$  (1.6 m)   PainSc:  2    Body mass index is 38.44 kg/m.     05/01/2022   11:34 AM 12/25/2021   12:03 PM 11/29/2021    8:00 PM 11/29/2021    9:12 AM 11/20/2021   11:16 AM 08/17/2020    1:47 PM 07/04/2019   10:41 AM  Advanced Directives  Does Patient Have a Medical Advance Directive? No Yes No No No No No  Does patient want to make changes to medical advance directive?  Yes (MAU/Ambulatory/Procedural Areas - Information given)       Would patient like information on creating a medical advance directive?   Yes (Inpatient - patient requests chaplain consult to create a medical advance directive)  No - Patient declined No - Patient declined No - Patient declined    Current Medications (verified) Outpatient Encounter Medications as of 05/01/2022  Medication Sig   acetaminophen (TYLENOL) 325 MG tablet Take 1 tablet (325 mg total) by mouth every 8 (eight) hours. (Patient taking differently: Take 650 mg by mouth 3 (three) times daily as needed (pain).)   albuterol (VENTOLIN HFA) 108 (90 Base) MCG/ACT inhaler USE 2 INHALATIONS BY MOUTH EVERY 6 HOURS AS NEEDED FOR WHEEZING  OR SHORTNESS OF BREATH   Calcium Carbonate Antacid (TUMS PO) Take 2 tablets by mouth 3 (three) times daily  as needed (reflux).   CALCIUM PO Take 1 tablet by mouth in the morning and at bedtime.   cetirizine (ZYRTEC) 10 MG tablet Take 10 mg by mouth daily.   Cholecalciferol (VITAMIN D3) 50 MCG (2000 UT) CAPS Take 2,000 Units by mouth daily.   fluticasone (FLONASE) 50 MCG/ACT nasal spray Place 1 spray into both nostrils 3 (three) times daily as needed for allergies or rhinitis.   lidocaine (LIDODERM) 5 % Place 1 patch onto the skin daily. Remove & Discard patch within 12 hours or as directed by MD (Patient not taking: Reported on 05/01/2022)   losartan-hydrochlorothiazide (HYZAAR) 100-25 MG tablet Take 1 tablet by mouth daily.   montelukast (SINGULAIR) 10 MG tablet TAKE 1 TABLET BY MOUTH  DAILY AS NEEDED FOR  WHEEZING   Multiple Vitamin (MULTIVITAMIN) tablet Take 1 tablet by mouth daily.   pantoprazole (PROTONIX) 40 MG tablet Take 1 tablet (40 mg total) by mouth daily.   No facility-administered encounter medications on file as of 05/01/2022.    Allergies (verified) Morphine and related, Penicillins, Pine, Shellfish allergy, Lactose intolerance (gi), Miralax [polyethylene glycol], Nickel, Other, Grapeseed extract [nutritional supplements], Latex, Red dye, Sulfites, and Wheat bran   History: Past Medical History:  Diagnosis Date   Allergy    Anemia    Anxiety    Arthritis    Asthma    borderline bronchitis   Bipolar I disorder ([place] 07/26/2020   Bronchitis  GERD (gastroesophageal reflux disease)    Goiter, toxic diffuse 05/30/2016   Hypertension    S/P tonsillectomy 05/30/2016   Sleep apnea    history of, MD tookpt. off machine 7 yrs, ago   Past Surgical History:  Procedure Laterality Date   ABDOMINAL HYSTERECTOMY     adnoides     BREAST SURGERY     CHOLECYSTECTOMY     COLONOSCOPY     DILATION AND CURETTAGE OF UTERUS     GASTRIC BYPASS  1998   IUD REMOVAL     TONSILLECTOMY     TOTAL KNEE ARTHROPLASTY Left 09/22/2018   TOTAL KNEE ARTHROPLASTY Left 09/22/2018   Procedure: LEFT  TOTAL KNEE ARTHROPLASTY;  Surgeon: Meredith Pel, MD;  Location: [facility];  Service: Orthopedics;  Laterality: Left;   TOTAL KNEE ARTHROPLASTY Right 11/29/2021   Procedure: RIGHT TOTAL KNEE ARTHROPLASTY;  Surgeon: Meredith Pel, MD;  Location: [facility];  Service: Orthopedics;  Laterality: Right;   Family History  Problem Relation Age of Onset   Hypertension Father    Diabetes Father    Lupus Daughter    Colon cancer Maternal Aunt    Colon cancer Maternal Uncle        4 mat uncles dx colon ca   Esophageal cancer Neg Hx    Rectal cancer Neg Hx    Stomach cancer Neg Hx    Social History   Socioeconomic History   Marital status: Single    Spouse name: Not on file   Number of children: Not on file   Years of education: Not on file   Highest education level: Not on file  Occupational History   Not on file  Tobacco Use   Smoking status: Former    Packs/day: 1.50    Years: 18.00    Total pack years: 27.00    Types: Cigarettes    Quit date: 09/16/1984    Years since quitting: 37.6   Smokeless tobacco: Never   Tobacco comments:    quit 1986  Vaping Use   Vaping Use: Never used  Substance and Sexual Activity   Alcohol use: Yes    Comment: social   Drug use: No   Sexual activity: Not on file  Other Topics Concern   Not on file  Social History Narrative   Not on file   Social Determinants of Health   Financial Resource Strain: Low Risk  (05/01/2022)   Overall Financial Resource Strain (CARDIA)    Difficulty of Paying Living Expenses: Not hard at all  Food Insecurity: No Food Insecurity (05/01/2022)   Hunger Vital Sign    Worried About Running Out of Food in the Last Year: Never true    Ran Out of Food in the Last Year: Never true  Transportation Needs: No Transportation Needs (05/01/2022)   PRAPARE - Hydrologist (Medical): No    Lack of Transportation (Non-Medical): No  Physical Activity: Insufficiently Active (05/01/2022)   Exercise Vital  Sign    Days of Exercise per Week: 7 days    Minutes of Exercise per Session: 20 min  Stress: No Stress Concern Present (05/01/2022)   [place]    Feeling of Stress : Not at all  Social Connections: Not on file    Tobacco Counseling Counseling given: Not Answered Tobacco comments: quit 1986   Clinical Intake:  Pre-visit preparation completed: Yes  Pain : 0-10 Pain Score: 2  Pain Type: Chronic pain Pain Location: Knee Pain Orientation: Right Pain Descriptors / Indicators: Aching Pain Onset: More than a month ago Pain Frequency: Intermittent     Nutritional Status: BMI > 30  Obese Nutritional Risks: None Diabetes: No  How often do you need to have someone help you when you read instructions, pamphlets, or other written materials from your doctor or pharmacy?: 1 - Never  Diabetic? no  Interpreter Needed?: No  Information entered by :: NAllen LPN   Activities of Daily Living    05/01/2022   11:36 AM 11/29/2021    8:00 PM  In your present state of health, do you have any difficulty performing the following activities:  Hearing? 1 0  Comment decreased hearing in right ear   Vision? 0 0  Difficulty concentrating or making decisions? 0 0  Walking or climbing stairs? 0 1  Dressing or bathing? 0 0  Doing errands, shopping? 0 1  Preparing Food and eating ? N   Using the Toilet? N   In the past six months, have you accidently leaked urine? N   Do you have problems with loss of bowel control? N   Managing your Medications? N   Managing your Finances? N   Housekeeping or managing your Housekeeping? N     Patient Care Team: Elsie Stain, MD as PCP - General (Pulmonary Disease)  Indicate any recent Medical Services you may have received from other than [facility] providers in the past year (date may be approximate).     Assessment:   This is a routine wellness examination for  [place].  Hearing/Vision screen Vision Screening - Comments:: Regular eye exams, [facility]  Dietary issues and exercise activities discussed: Current Exercise Habits: The patient does not participate in regular exercise at present   Goals Addressed             This Visit's Progress    Patient Stated       05/01/2022, wants to get shoulder taken care of and keep weight down       Depression Screen    05/01/2022   11:36 AM 03/28/2022   10:08 AM 10/02/2021   10:38 AM 05/02/2021    9:34 AM 03/19/2021    2:19 PM 02/17/2020    9:11 AM 09/02/2018    9:29 AM  PHQ 2/9 Scores  PHQ - 2 Score 0 0 0 0 0 1 0  PHQ- 9 Score  '2 1 2 2 6 4    '$ Fall Risk    05/01/2022   11:35 AM 03/28/2022    9:52 AM 05/02/2021    9:34 AM 03/19/2021    2:19 PM 07/26/2020   11:28 AM  Fall Risk   Falls in the past year? 1 0 0 0 0  Comment knee gave out      Number falls in past yr: 1 0 0 0 0  Injury with Fall? 0 0 0 0 0  Risk for fall due to : Medication side effect No Fall Risks No Fall Risks No Fall Risks No Fall Risks  Follow up Falls prevention discussed;Education provided;Falls evaluation completed Falls evaluation completed       FALL RISK PREVENTION PERTAINING TO THE HOME:  Any stairs in or around the home? Yes  If so, are there any without handrails? No  Home free of loose throw rugs in walkways, pet beds, electrical cords, etc? Yes  Adequate lighting in your home to reduce risk of falls? Yes  ASSISTIVE DEVICES UTILIZED TO PREVENT FALLS:  Life alert? No  Use of a cane, walker or w/c? No  Grab bars in the bathroom? No  Shower chair or bench in shower? Yes  Elevated toilet seat or a handicapped toilet? Yes   TIMED UP AND GO:  Was the test performed? No .      Cognitive Function:        05/01/2022   11:39 AM  6CIT Screen  What Year? 0 points  What month? 0 points  What time? 0 points  Count back from 20 0 points  Months in reverse 0 points  Repeat phrase 4 points  Total Score 4 points     Immunizations Immunization History  Administered Date(s) Administered   Moderna SARS-COV2 Booster Vaccination 03/08/2020   Moderna Sars-Covid-2 Vaccination 05/11/2019, 06/08/2019   PPD Test 06/18/2017   Tdap 05/27/2018, 05/27/2018   Zoster Recombinat (Shingrix) 05/08/2010    TDAP status: Up to date  Flu Vaccine status: Declined, Education has been provided regarding the importance of this vaccine but patient still declined. Advised may receive this vaccine at local pharmacy or Health Dept. Aware to provide a copy of the vaccination record if obtained from local pharmacy or Health Dept. Verbalized acceptance and understanding.  Pneumococcal vaccine status: Declined,  Education has been provided regarding the importance of this vaccine but patient still declined. Advised may receive this vaccine at local pharmacy or Health Dept. Aware to provide a copy of the vaccination record if obtained from local pharmacy or Health Dept. Verbalized acceptance and understanding.   Covid-19 vaccine status: Completed vaccines  Qualifies for Shingles Vaccine? Yes   Zostavax completed No   Shingrix Completed?: No.    Education has been provided regarding the importance of this vaccine. Patient has been advised to call insurance company to determine out of pocket expense if they have not yet received this vaccine. Advised may also receive vaccine at local pharmacy or Health Dept. Verbalized acceptance and understanding.  Screening Tests Health Maintenance  Topic Date Due   Medicare Annual Wellness (AWV)  02/16/2021   Pneumonia Vaccine 52+ Years old (1 of 1 - PCV) 03/29/2023 (Originally 07/16/2018)   MAMMOGRAM  09/18/2023   DTaP/Tdap/Td (3 - Td or Tdap) 05/26/2028   Hepatitis C Screening  Completed   HPV VACCINES  Aged Out   INFLUENZA VACCINE  Discontinued   DEXA SCAN  Discontinued   COVID-19 Vaccine  Discontinued   Fecal DNA (Cologuard)  Discontinued   Zoster Vaccines- Shingrix  Discontinued     Health Maintenance  Health Maintenance Due  Topic Date Due   Medicare Annual Wellness (AWV)  02/16/2021    Colorectal cancer screening: No longer required.   Mammogram status: Completed 09/17/2021. Repeat every year  Bone Density status: Completed 09/17/2021.   Lung Cancer Screening: (Low Dose CT Chest recommended if Age 104-80 years, 30 pack-year currently smoking OR have quit w/in 15years.) does not qualify.   Lung Cancer Screening Referral: no  Additional Screening:  Hepatitis C Screening: does qualify; Completed 12/31/2017  Vision Screening: Recommended annual ophthalmology exams for early detection of glaucoma and other disorders of the eye. Is the patient up to date with their annual eye exam?  Yes  Who is the provider or what is the name of the office in which the patient attends annual eye exams? [facility] If pt is not established with a provider, would they like to be referred to a provider to establish care? No .  Dental Screening: Recommended annual dental exams for proper oral hygiene  Community Resource Referral / Chronic Care Management: CRR required this visit?  No   CCM required this visit?  No      Plan:     I have personally reviewed and noted the following in the patient's chart:   Medical and social history Use of alcohol, tobacco or illicit drugs  Current medications and supplements including opioid prescriptions. Patient is not currently taking opioid prescriptions. Functional ability and status Nutritional status Physical activity Advanced directives List of other physicians Hospitalizations, surgeries, and ER visits in previous 12 months Vitals Screenings to include cognitive, depression, and falls Referrals and appointments  In addition, I have reviewed and discussed with patient certain preventive protocols, quality metrics, and best practice recommendations. A written personalized care plan for preventive services as well as general  preventive health recommendations were provided to patient.     Kellie Simmering, LPN   [id number]   Nurse Notes: none  Due to this being a virtual visit, the after visit summary with patients personalized plan was offered to patient via mail or my-chart. Patient would like to access on my-chart

## 2022-05-01 NOTE — Telephone Encounter (Signed)
Copied from [place] [phone]. Topic: General - Other >> May 01, 2022 11:02 AM Sabas Sous wrote: Reason for CRM: Pt called requesting a doctor's note for work stating that she can go back to work on March 10th 2024 and be able to work with her restrictions that accommodate her disability. She needs a chair to sit from time to time because she has had double knee replacements.

## 2022-05-01 NOTE — Telephone Encounter (Signed)
fyi

## 2022-05-01 NOTE — Patient Instructions (Signed)
[name] , Thank you for taking time to come for your Medicare Wellness Visit. I appreciate your ongoing commitment to your health goals. Please review the following plan we discussed and let me know if I can assist you in the future.   These are the goals we discussed:  Goals      Patient Stated     05/01/2022, wants to get shoulder taken care of and keep weight down        This is a list of the screening recommended for you and due dates:  Health Maintenance  Topic Date Due   Pneumonia Vaccine (1 of 1 - PCV) 03/29/2023*   Medicare Annual Wellness Visit  05/01/2023   Mammogram  09/18/2023   DTaP/Tdap/Td vaccine (3 - Td or Tdap) 05/26/2028   Hepatitis C Screening: USPSTF Recommendation to screen - Ages 17-79 yo.  Completed   HPV Vaccine  Aged Out   Flu Shot  Discontinued   DEXA scan (bone density measurement)  Discontinued   COVID-19 Vaccine  Discontinued   Cologuard (Stool DNA test)  Discontinued   Zoster (Shingles) Vaccine  Discontinued  *Topic was postponed. The date shown is not the original due date.    Advanced directives: Advance directive discussed with you today.   Conditions/risks identified: none  Next appointment: Follow up in one year for your annual wellness visit    Preventive Care 65 Years and Older, Female Preventive care refers to lifestyle choices and visits with your health care provider that can promote health and wellness. What does preventive care include? A yearly physical exam. This is also called an annual well check. Dental exams once or twice a year. Routine eye exams. Ask your health care provider how often you should have your eyes checked. Personal lifestyle choices, including: Daily care of your teeth and gums. Regular physical activity. Eating a healthy diet. Avoiding tobacco and drug use. Limiting alcohol use. Practicing safe sex. Taking low-dose aspirin every day. Taking vitamin and mineral supplements as recommended by your  health care provider. What happens during an annual well check? The services and screenings done by your health care provider during your annual well check will depend on your age, overall health, lifestyle risk factors, and family history of disease. Counseling  Your health care provider may ask you questions about your: Alcohol use. Tobacco use. Drug use. Emotional well-being. Home and relationship well-being. Sexual activity. Eating habits. History of falls. Memory and ability to understand (cognition). Work and work Statistician. Reproductive health. Screening  You may have the following tests or measurements: Height, weight, and BMI. Blood pressure. Lipid and cholesterol levels. These may be checked every 5 years, or more frequently if you are over 22 years old. Skin check. Lung cancer screening. You may have this screening every year starting at age 33 if you have a 30-pack-year history of smoking and currently smoke or have quit within the past 15 years. Fecal occult blood test (FOBT) of the stool. You may have this test every year starting at age 21. Flexible sigmoidoscopy or colonoscopy. You may have a sigmoidoscopy every 5 years or a colonoscopy every 10 years starting at age 63. Hepatitis C blood test. Hepatitis B blood test. Sexually transmitted disease (STD) testing. Diabetes screening. This is done by checking your blood sugar (glucose) after you have not eaten for a while (fasting). You may have this done every 1-3 years. Bone density scan. This is done to screen for osteoporosis. You may have this  done starting at age 69. Mammogram. This may be done every 1-2 years. Talk to your health care provider about how often you should have regular mammograms. Talk with your health care provider about your test results, treatment options, and if necessary, the need for more tests. Vaccines  Your health care provider may recommend certain vaccines, such as: Influenza vaccine.  This is recommended every year. Tetanus, diphtheria, and acellular pertussis (Tdap, Td) vaccine. You may need a Td booster every 10 years. Zoster vaccine. You may need this after age 69. Pneumococcal 13-valent conjugate (PCV13) vaccine. One dose is recommended after age 32. Pneumococcal polysaccharide (PPSV23) vaccine. One dose is recommended after age 33. Talk to your health care provider about which screenings and vaccines you need and how often you need them. This information is not intended to replace advice given to you by your health care provider. Make sure you discuss any questions you have with your health care provider. Document Released: 03/10/2015 Document Revised: 11/01/2015 Document Reviewed: 12/13/2014 Elsevier Interactive Patient Education  2017 [place] Prevention in the Home Falls can cause injuries. They can happen to people of all ages. There are many things you can do to make your home safe and to help prevent falls. What can I do on the outside of my home? Regularly fix the edges of walkways and driveways and fix any cracks. Remove anything that might make you trip as you walk through a door, such as a raised step or threshold. Trim any bushes or trees on the path to your home. Use bright outdoor lighting. Clear any walking paths of anything that might make someone trip, such as rocks or tools. Regularly check to see if handrails are loose or broken. Make sure that both sides of any steps have handrails. Any raised decks and porches should have guardrails on the edges. Have any leaves, snow, or ice cleared regularly. Use sand or salt on walking paths during winter. Clean up any spills in your garage right away. This includes oil or grease spills. What can I do in the bathroom? Use night lights. Install grab bars by the toilet and in the tub and shower. Do not use towel bars as grab bars. Use non-skid mats or decals in the tub or shower. If you need to sit  down in the shower, use a plastic, non-slip stool. Keep the floor dry. Clean up any water that spills on the floor as soon as it happens. Remove soap buildup in the tub or shower regularly. Attach bath mats securely with double-sided non-slip rug tape. Do not have throw rugs and other things on the floor that can make you trip. What can I do in the bedroom? Use night lights. Make sure that you have a light by your bed that is easy to reach. Do not use any sheets or blankets that are too big for your bed. They should not hang down onto the floor. Have a firm chair that has side arms. You can use this for support while you get dressed. Do not have throw rugs and other things on the floor that can make you trip. What can I do in the kitchen? Clean up any spills right away. Avoid walking on wet floors. Keep items that you use a lot in easy-to-reach places. If you need to reach something above you, use a strong step stool that has a grab bar. Keep electrical cords out of the way. Do not use floor polish or wax  that makes floors slippery. If you must use wax, use non-skid floor wax. Do not have throw rugs and other things on the floor that can make you trip. What can I do with my stairs? Do not leave any items on the stairs. Make sure that there are handrails on both sides of the stairs and use them. Fix handrails that are broken or loose. Make sure that handrails are as long as the stairways. Check any carpeting to make sure that it is firmly attached to the stairs. Fix any carpet that is loose or worn. Avoid having throw rugs at the top or bottom of the stairs. If you do have throw rugs, attach them to the floor with carpet tape. Make sure that you have a light switch at the top of the stairs and the bottom of the stairs. If you do not have them, ask someone to add them for you. What else can I do to help prevent falls? Wear shoes that: Do not have high heels. Have rubber bottoms. Are  comfortable and fit you well. Are closed at the toe. Do not wear sandals. If you use a stepladder: Make sure that it is fully opened. Do not climb a closed stepladder. Make sure that both sides of the stepladder are locked into place. Ask someone to hold it for you, if possible. Clearly mark and make sure that you can see: Any grab bars or handrails. First and last steps. Where the edge of each step is. Use tools that help you move around (mobility aids) if they are needed. These include: Canes. Walkers. Scooters. Crutches. Turn on the lights when you go into a dark area. Replace any light bulbs as soon as they burn out. Set up your furniture so you have a clear path. Avoid moving your furniture around. If any of your floors are uneven, fix them. If there are any pets around you, be aware of where they are. Review your medicines with your doctor. Some medicines can make you feel dizzy. This can increase your chance of falling. Ask your doctor what other things that you can do to help prevent falls. This information is not intended to replace advice given to you by your health care provider. Make sure you discuss any questions you have with your health care provider. Document Released: 12/08/2008 Document Revised: 07/20/2015 Document Reviewed: 03/18/2014 Elsevier Interactive Patient Education  2017 [employer].

## 2022-06-05 ENCOUNTER — Other Ambulatory Visit: Payer: Self-pay | Admitting: Critical Care Medicine

## 2022-06-25 ENCOUNTER — Other Ambulatory Visit: Payer: Self-pay | Admitting: Critical Care Medicine

## 2022-07-12 ENCOUNTER — Ambulatory Visit: Payer: Self-pay | Admitting: *Deleted

## 2022-07-12 ENCOUNTER — Other Ambulatory Visit: Payer: Self-pay | Admitting: Pharmacist

## 2022-07-12 MED ORDER — CEFDINIR 300 MG PO CAPS: 300.0000 mg | ORAL_CAPSULE | Freq: Once | ORAL | 0 refills | Status: AC

## 2022-07-12 NOTE — Telephone Encounter (Signed)
Summary: req antibiotic for dental procedure   pt called in says needs antibiotic sent before she has her dental procedure. Please call back      Called pt - unable to LM  - voicemail is full.

## 2022-07-12 NOTE — Telephone Encounter (Signed)
Message from [place] sent at 07/12/2022 12:28 PM EDT  Summary: req antibiotic for dental procedure   pt called in says needs antibiotic sent before she has her dental procedure. Please call back          Call History   Type Contact Phone/Fax User  07/12/2022 12:28 PM EDT Phone (Incoming) Cheryl, Bhullar Prince (Self) [phone] Judie Petit) [name], [name]

## 2022-07-12 NOTE — Telephone Encounter (Signed)
Attempted to return her call.   "The voicemail is full and unable to accept calls at this time".

## 2022-07-12 NOTE — Addendum Note (Signed)
Addended by: Storm Frisk on: 07/12/2022 05:34 PM   Modules accepted: Orders

## 2022-07-12 NOTE — Telephone Encounter (Signed)
I will send a one time dose of  cefdinir

## 2022-07-12 NOTE — Telephone Encounter (Signed)
Pt needs one dose 500mg  amoxcillin  Can you call this in  and i can cosign I camnot.do this on my phone

## 2022-07-12 NOTE — Telephone Encounter (Signed)
Summary: req antibiotic for dental procedure   pt called in says needs antibiotic sent before she has her dental procedure. Please call back      Called pt - Call went to voice mail. Mailbox is full.  Will forward encounter to clinic for follow up per protocol.

## 2022-07-12 NOTE — Telephone Encounter (Signed)
Call placed to patient unable to reach unable to leave message. 

## 2022-07-15 ENCOUNTER — Telehealth: Payer: Self-pay | Admitting: Critical Care Medicine

## 2022-07-15 ENCOUNTER — Other Ambulatory Visit: Payer: Self-pay | Admitting: Pharmacist

## 2022-07-15 MED ORDER — CEFDINIR 300 MG PO CAPS: 300.0000 mg | ORAL_CAPSULE | ORAL | 0 refills | Status: DC

## 2022-07-15 NOTE — Telephone Encounter (Signed)
Copied from CRM [id number]. Topic: General - Other >> Jul 15, 2022  9:23 AM Franchot Heidelberg wrote: Reason for CRM: Pt called reporting that she needs to take an antibiotic prior to her teeth cleaning tomorrow.   [id number] - [place], [place] - [address] AT [facility] OF [address] [place] [address] Phone: [phone] Fax: [phone]  She is allergic to penicillin  Says she called last week about this, requesting to speak to [facility] and says she never received a call.

## 2022-07-15 NOTE — Telephone Encounter (Signed)
Called patient and she was already given a call early regarding medication at pharmacy

## 2022-07-15 NOTE — Telephone Encounter (Signed)
No problem.

## 2022-08-05 ENCOUNTER — Other Ambulatory Visit: Payer: Self-pay | Admitting: Critical Care Medicine

## 2022-08-05 DIAGNOSIS — Z1231 Encounter for screening mammogram for malignant neoplasm of breast: Secondary | ICD-10-CM

## 2022-08-14 ENCOUNTER — Other Ambulatory Visit: Payer: Self-pay | Admitting: Critical Care Medicine

## 2022-08-14 DIAGNOSIS — Z760 Encounter for issue of repeat prescription: Secondary | ICD-10-CM

## 2022-09-02 ENCOUNTER — Other Ambulatory Visit: Payer: Self-pay | Admitting: Critical Care Medicine

## 2022-09-02 NOTE — Progress Notes (Signed)
Established Patient Office Visit  Subjective   Patient ID: Cheryl Prince, female    DOB: 15-Jun-1955  Age: 67 y.o. MRN: [id number]    Chief Complaint  Patient presents with   Hypertension    07/26/20 Cheryl Prince presents for PCP to f/u    Former [facility] pt. The patient has been experiencing frequent chronic right shoulder and right hip pain for a few months. The right shoulder pain occurs throughout the shoulder area and joint. The pain is worse with movement and improves with keeping the right shoulder still. The patient takes Tylenol and gabapentin for the pain which is helpful. The patient has seen orthopedics in the past for this pain and was told she may need a right shoulder replacement in the future. She also states she saw physical therapy in the past for her shoulder which was helpful. The patient's right hip pain occurs mostly in the anterior and lateral aspect of the right hip. The pain improves with taking Tylenol and gabapentin. The pain is exacerbated by walking on the treadmill. She has seen physical therapy in the past for the right hip pain which was helpful. She would like a referral for physical therapy today.   The patient has a history of hypertension which is well controlled on her current dose of HCTZ.   The patient has chronic bronchitis for which she takes Flovent while experiencing symptoms and albuterol as needed.   The patient has multiple seasonal and food allergies which cause itching. The patient takes hydroxyzine and Singulair for these symptoms.   The patient has daily gastroesophageal reflux symptoms for which she takes Pepcid and Compazine.    The patient's medical record reveals a diagnosis of bipolar 1 disorder, though the patient denies having this diagnosis. She admits to having PTSD from [employer].    12/12/2020 Since the last visit in June the patient has been doing better.  She is undergoing physical therapy and her right  hip and left shoulder are improved.  She is joined the [employer] she is doing swimming and other exercises she has a home gym as well she has lost weight she is got an air [place] and trying to improve her diet.  She is requesting a new electric lift chair because she has difficulty rising from a sitting position.  She does have an upcoming DEXA scan and mammogram in December.  Patient has no other real complaints blood pressures been stable.   03/19/21 Since the last visit in October the patient's undergone a great deal of stress and that she lost a cousin in a parent and short periods of time over 2 months.  On arrival blood pressure elevated 176/107.  She is drinking several vodkas a day as well.  Patient is gained some weight and is no longer exercising at the [facility] as she had previously.  She is on the hydrochlorothiazide alone 25 mg.  Her asthma is stable at this time on the Flovent and montelukast and as needed albuterol.  Patient declines to receive any vaccines as she has a prior history of hypersensitivity to all vaccines.   Her diet is also not been very compliant and she is eating a lot of carbohydrates and salt foods.  After resting for 10 minutes I rechecked her blood pressure and it was still elevated as previous.   05/02/2021 Patient is seen in return follow-up she does have some chronic right knee pain.  The patient's overall mood and stress levels  have improved.  She is about to travel to [place] to see her brother for his birthday.  She is trying to eat a healthier diet.  She knows she has a mammogram that is coming up in July of this year.  On arrival blood pressure 138/80.   The patient's been careful to watch her sugar and salt intake but does need improvement in this regard.   Patient is compliant with her blood pressure medications.  She remains on the losartan HCT 100/25.   Patient does need refills on her medications.  She does use the Singulair as needed hydroxyzine as needed  Flovent is being used along with Pepcid.  Gabapentin is twice daily as well. 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  She has been working 6 hours a day 3 days a week but no new management wanted to work 5 days a week 6 hours each.  She has significant arthritis in the right knee neck and shoulder.  Because of this she can only work the 3-day week 6-hour day schedule.  She is  needing a letter stating as such.  8/8 Patient seen in return follow-up she has been monitoring blood pressure at home and has written improved numbers today on arrival 110/78 her blood pressures on her home meter are in similar range.  She has a planned total right knee replacement in October.  She is interested in getting PCS services.  Patient has no other complaints at this visit.  03/28/22 Patient seen in return follow-up she is doing very well from her right total knee replacement which occurred in October.  She still has a little neuropathy in the leg.  She has good range of motion.  She did have a DVT post hospital postop and took a course of apixaban she is now off this without symptoms.  Patient does have heartburn symptoms would like this evaluated.  Blood pressure on arrival is good 126/87.  09/03/22 The patient returns in follow-up her neuropathy of the right knee after knee replacement has resolved.  She is trying to lose some weight.  BMI is up to at 38.  She does go to the bariatric clinic.  Blood pressure on arrival today is also elevated 143/86.  She has remained compliant with hydrochlorothiazide losartan.  She is trying to follow a healthier diet.  She is under some degree of stress as her father is not  doing well and she needs to go to [place] to check on him.  Heartburn symptoms are improved.     Patient Active Problem List   Diagnosis Date Noted   Class 2 severe obesity due to excess calories with serious comorbidity and body mass index (BMI) of 38.0 to 38.9 in adult [facility]) 03/28/2022   Status post total left knee replacement 03/28/2022   Urticaria 03/16/2022   Pruritic dermatitis 03/16/2022   Allergic reaction to food 03/16/2022   S/P total knee arthroplasty, right 12/26/2021   Right hip pain 07/26/2020   Allergies 07/26/2020   GERD (gastroesophageal reflux disease) 07/26/2020   At risk for decreased bone density 07/26/2020   PTSD (post-traumatic stress disorder) 05/30/2016   Alpha thalassemia trait  05/30/2016   Hypertension 05/30/2016   Rotator cuff tendonitis, left 05/30/2016   Carpal tunnel syndrome 05/30/2016   Prediabetes 05/30/2016   Onychomycosis 05/30/2016   Past Medical History:  Diagnosis Date   Allergy    Anemia    Anxiety    Arthritis    Asthma    borderline bronchitis   Bipolar I disorder (HCC) 07/26/2020   Bronchitis    Deep venous thrombosis (HCC) 12/26/2021   GERD (gastroesophageal reflux disease)    Goiter, toxic diffuse 05/30/2016   Hypertension    S/P tonsillectomy 05/30/2016   Sleep apnea    history of, MD tookpt. off machine 7 yrs, ago   Past Surgical History:  Procedure Laterality Date   ABDOMINAL HYSTERECTOMY     adnoides     BREAST SURGERY     CHOLECYSTECTOMY     COLONOSCOPY     DILATION AND CURETTAGE OF UTERUS     GASTRIC BYPASS  1998   IUD REMOVAL     TONSILLECTOMY     TOTAL KNEE ARTHROPLASTY Left 09/22/2018   TOTAL KNEE ARTHROPLASTY Left 09/22/2018   Procedure: LEFT TOTAL KNEE ARTHROPLASTY;  Surgeon: Cammy Copa, MD;  Location: [facility] OR;  Service: Orthopedics;  Laterality: Left;   TOTAL KNEE ARTHROPLASTY Right 11/29/2021   Procedure: RIGHT TOTAL KNEE ARTHROPLASTY;  Surgeon: Cammy Copa, MD;  Location: [facility] OR;   Service: Orthopedics;  Laterality: Right;   Social History   Tobacco Use   Smoking status: Former    Packs/day: 1.50    Years: 18.00    Additional pack years: 0.00    Total pack years: 27.00    Types: Cigarettes    Quit date: 09/16/1984    Years since quitting: 37.9   Smokeless tobacco: Never   Tobacco comments:    quit 1986  Vaping Use   Vaping Use: Never used  Substance Use Topics   Alcohol use: Yes    Comment: social   Drug use: No   Social History   Socioeconomic History   Marital status: Single    Spouse name: Not on file   Number of children: Not on file   Years of education: Not on file   Highest education level: Not on file  Occupational History   Not on file  Tobacco Use   Smoking status: Former    Packs/day: 1.50    Years: 18.00    Additional pack years: 0.00    Total pack years: 27.00    Types: Cigarettes    Quit date: 09/16/1984    Years since quitting: 37.9   Smokeless tobacco: Never   Tobacco comments:    quit 1986  Vaping Use   Vaping Use: Never used  Substance and Sexual Activity   Alcohol use: Yes    Comment: social   Drug use: No   Sexual activity: Not on file  Other Topics Concern   Not on file  Social History Narrative   Not on file   Social Determinants of Health   Financial Resource Strain: Low Risk  (05/01/2022)   Overall Financial Resource Strain (CARDIA)    Difficulty of Paying Living Expenses: Not hard at all  Food Insecurity: No Food Insecurity (05/01/2022)   Hunger Vital Sign    Worried About Running Out of Food in the Last Year: Never true    Ran Out of Food in the Last Year: Never true  Transportation Needs: No Transportation Needs (05/01/2022)   PRAPARE - Transportation  Lack of Transportation (Medical): No    Lack of Transportation (Non-Medical): No  Physical Activity: Insufficiently Active (05/01/2022)   Exercise Vital Sign    Days of Exercise per Week: 7 days    Minutes of Exercise per Session: 20 min  Stress: No  Stress Concern Present (05/01/2022)   [employer] of Occupational Health - Occupational Stress Questionnaire    Feeling of Stress : Not at all  Social Connections: Not on file  Intimate Partner Violence: Patient Declined (11/30/2021)   Humiliation, Afraid, Rape, and Kick questionnaire    Fear of Current or Ex-Partner: Patient declined    Emotionally Abused: Patient declined    Physically Abused: Patient declined    Sexually Abused: Patient declined   Family Status  Relation Name Status   Mother  Deceased   Father  Alive       per pt father have a Pacemaker   Sister  Other       Carrier of Sickle Cell Anemia   Daughter  Alive   Mat Aunt  Alive   Mat Uncle  (Not Specified)   Neg Hx  (Not Specified)   Family History  Problem Relation Age of Onset   Hypertension Father    Diabetes Father    Lupus Daughter    Colon cancer Maternal Aunt    Colon cancer Maternal Uncle        4 mat uncles dx colon ca   Esophageal cancer Neg Hx    Rectal cancer Neg Hx    Stomach cancer Neg Hx    Allergies  Allergen Reactions   Morphine And Codeine Diarrhea, Nausea And Vomiting and Swelling   Penicillins Swelling and Other (See Comments)    Comatose for 2 days    Pine Shortness Of Breath   Shellfish Allergy Shortness Of Breath and Swelling   Lactose Intolerance (Gi) Other (See Comments)    GI upset   Miralax [Polyethylene Glycol] Itching   Nickel Other (See Comments)    Pt states she gets abscess from the earrings with nickel    Other Itching and Nausea And Vomiting    Mushrooms - feels high, and dizzy    Grapeseed Extract [Nutritional Supplements] Rash    grapes   Latex Itching and Rash    "IF" condom, it makes it painful   Red Dye Swelling and Rash    The dye for checking thyroid.  Red Cast Dye   Sulfites Hives and Rash    Tingling in mouth   Wheat Rash and Other (See Comments)     tingling in mouth, vomiting      Review of Systems  Constitutional:  Negative for chills,  diaphoresis, fever, malaise/fatigue and weight loss.  HENT:  Negative for congestion, hearing loss, nosebleeds, sore throat and tinnitus.   Eyes:  Negative for blurred vision, photophobia and redness.  Respiratory:  Negative for cough, hemoptysis, sputum production, shortness of breath, wheezing and stridor.   Cardiovascular:  Negative for chest pain, palpitations, orthopnea, claudication, leg swelling and PND.  Gastrointestinal:  Negative for abdominal pain, blood in stool, constipation, diarrhea, heartburn, nausea and vomiting.  Genitourinary:  Negative for dysuria, flank pain, frequency, hematuria and urgency.  Musculoskeletal:  Negative for back pain, falls, joint pain, myalgias and neck pain.  Skin:  Negative for itching and rash.  Neurological:  Negative for dizziness, tingling, tremors, sensory change, speech change, focal weakness, seizures, loss of consciousness, weakness and headaches.  Endo/Heme/Allergies:  Negative for environmental allergies and  polydipsia. Does not bruise/bleed easily.  Psychiatric/Behavioral:  Negative for depression, memory loss, substance abuse and suicidal ideas. The patient is not nervous/anxious and does not have insomnia.       Objective:     BP (!) 143/86 (BP Location: Right Arm, Patient Position: Sitting, Cuff Size: Normal)   Pulse 60   Wt 215 lb 3.2 oz (97.6 kg)   SpO2 98%   BMI 38.12 kg/m  BP Readings from Last 3 Encounters:  09/03/22 (!) 143/86  03/28/22 126/87  03/16/22 (!) 158/89   Wt Readings from Last 3 Encounters:  09/03/22 215 lb 3.2 oz (97.6 kg)  05/01/22 217 lb (98.4 kg)  03/28/22 220 lb 12.8 oz (100.2 kg)      Physical Exam Vitals reviewed.  Constitutional:      Appearance: Normal appearance. She is well-developed. She is obese. She is not diaphoretic.  HENT:     Head: Normocephalic and atraumatic.     Nose: No nasal deformity, septal deviation, mucosal edema or rhinorrhea.     Right Sinus: No maxillary sinus tenderness or  frontal sinus tenderness.     Left Sinus: No maxillary sinus tenderness or frontal sinus tenderness.     Mouth/Throat:     Pharynx: No oropharyngeal exudate.  Eyes:     General: No scleral icterus.    Conjunctiva/sclera: Conjunctivae normal.     Pupils: Pupils are equal, round, and reactive to light.  Neck:     Thyroid: No thyromegaly.     Vascular: No carotid bruit or JVD.     Trachea: Trachea normal. No tracheal tenderness or tracheal deviation.  Cardiovascular:     Rate and Rhythm: Normal rate and regular rhythm.     Chest Wall: PMI is not displaced.     Pulses: Normal pulses. No decreased pulses.     Heart sounds: Normal heart sounds, S1 normal and S2 normal. Heart sounds not distant. No murmur heard.    No systolic murmur is present.     No diastolic murmur is present.     No friction rub. No gallop. No S3 or S4 sounds.  Pulmonary:     Effort: No tachypnea, accessory muscle usage or respiratory distress.     Breath sounds: No stridor. No decreased breath sounds, wheezing, rhonchi or rales.  Chest:     Chest wall: No tenderness.  Abdominal:     General: Bowel sounds are normal. There is no distension.     Palpations: Abdomen is soft. Abdomen is not rigid.     Tenderness: There is no abdominal tenderness. There is no guarding or rebound.  Musculoskeletal:        General: No deformity. Normal range of motion.     Cervical back: Normal range of motion and neck supple. No edema, erythema or rigidity. No muscular tenderness. Normal range of motion.     Comments: Wound healed up on the right total joint replacement of the knee and good range of motion  Lymphadenopathy:     Head:     Right side of head: No submental or submandibular adenopathy.     Left side of head: No submental or submandibular adenopathy.     Cervical: No cervical adenopathy.  Skin:    General: Skin is warm and dry.     Coloration: Skin is not pale.     Findings: No rash.     Nails: There is no clubbing.   Neurological:     Mental Status: She is alert  and oriented to person, place, and time.     Sensory: No sensory deficit.  Psychiatric:        Speech: Speech normal.        Behavior: Behavior normal.    No exam this is a phone visit  No results found for any visits on 09/03/22.  Last CBC Lab Results  Component Value Date   WBC 4.1 11/20/2021   HGB 11.1 (L) 11/20/2021   HCT 35.2 (L) 11/20/2021   MCV 77.2 (L) 11/20/2021   MCH 24.3 (L) 11/20/2021   RDW 15.7 (H) 11/20/2021   PLT 260 11/20/2021   Last metabolic panel Lab Results  Component Value Date   GLUCOSE 98 11/20/2021   NA 135 11/20/2021   K 3.5 11/20/2021   CL 104 11/20/2021   CO2 26 11/20/2021   BUN 9 11/20/2021   CREATININE 0.74 11/20/2021   GFRNONAA >60 11/20/2021   CALCIUM 8.4 (L) 11/20/2021   PROT 7.9 10/02/2021   ALBUMIN 4.6 10/02/2021   LABGLOB 3.3 10/02/2021   AGRATIO 1.4 10/02/2021   BILITOT 0.6 10/02/2021   ALKPHOS 110 10/02/2021   AST 22 10/02/2021   ALT 18 10/02/2021   ANIONGAP 5 11/20/2021   Last lipids Lab Results  Component Value Date   CHOL 187 10/02/2021   HDL 61 10/02/2021   LDLCALC 110 (H) 10/02/2021   TRIG 90 10/02/2021   CHOLHDL 3.1 10/02/2021   Last hemoglobin A1c Lab Results  Component Value Date   HGBA1C 5.7 (H) 10/02/2021   Last thyroid functions Lab Results  Component Value Date   TSH 1.210 12/11/2016   The 10-year ASCVD risk score (Arnett DK, et al., 2019) is: 13.3%    Assessment & Plan:   Problem List Items Addressed This Visit       Cardiovascular and Mediastinum   Hypertension - Primary    Not well-controlled will continue with lifestyle approach maintain losartan HCT and see patient back short-term follow-up      Relevant Medications   losartan-hydrochlorothiazide (HYZAAR) 100-25 MG tablet   Other Relevant Orders   BMP8+eGFR   RESOLVED: Deep venous thrombosis (HCC)    Resolved and off anticoagulant      Relevant Medications    losartan-hydrochlorothiazide (HYZAAR) 100-25 MG tablet     Other   Class 2 severe obesity due to excess calories with serious comorbidity and body mass index (BMI) of 38.0 to 38.9 in adult (HCC)    Progressive weight gain with effects on hypertension and arthritis in multiple areas.  Will see if her insurance will cover wegovy  and we ordered same.  I did review lifestyle medicine approach The following Lifestyle Medicine recommendations according to American College of Lifestyle Medicine [facility]) were discussed and offered to patient who agrees to start the journey:  A. Whole Foods, Plant-based plate comprising of fruits and vegetables, plant-based proteins, whole-grain carbohydrates was discussed in detail with the patient.   A list for source of those nutrients were also provided to the patient.  Patient will use only water or unsweetened tea for hydration. B.  The need to stay away from risky substances including alcohol, smoking; obtaining 7 to 9 hours of restorative sleep, at least 150 minutes of moderate intensity exercise weekly, the importance of healthy social connections,  and stress reduction techniques were discussed.        Relevant Medications   Semaglutide-Weight Management (WEGOVY) 0.25 MG/0.5ML SOAJ  Follow-up in 2 months 30 minutes spent extra time needed assessing multiple  problems Shan Levans

## 2022-09-03 ENCOUNTER — Ambulatory Visit: Payer: Medicare Other | Attending: Critical Care Medicine | Admitting: Critical Care Medicine

## 2022-09-03 ENCOUNTER — Encounter: Payer: Self-pay | Admitting: Critical Care Medicine

## 2022-09-03 VITALS — BP 143/86 | HR 60 | Wt 215.2 lb

## 2022-09-03 DIAGNOSIS — I82491 Acute embolism and thrombosis of other specified deep vein of right lower extremity: Secondary | ICD-10-CM | POA: Diagnosis not present

## 2022-09-03 DIAGNOSIS — Z6838 Body mass index (BMI) 38.0-38.9, adult: Secondary | ICD-10-CM | POA: Diagnosis not present

## 2022-09-03 DIAGNOSIS — I10 Essential (primary) hypertension: Secondary | ICD-10-CM | POA: Diagnosis not present

## 2022-09-03 MED ORDER — MONTELUKAST SODIUM 10 MG PO TABS: ORAL_TABLET | ORAL | 3 refills | Status: DC

## 2022-09-03 MED ORDER — PANTOPRAZOLE SODIUM 40 MG PO TBEC: 40.0000 mg | DELAYED_RELEASE_TABLET | Freq: Every day | ORAL | 2 refills | Status: DC

## 2022-09-03 MED ORDER — LOSARTAN POTASSIUM-HCTZ 100-25 MG PO TABS: 1.0000 | ORAL_TABLET | Freq: Every day | ORAL | 3 refills | Status: DC

## 2022-09-03 MED ORDER — WEGOVY 0.25 MG/0.5ML ~~LOC~~ SOAJ: 0.2500 mg | SUBCUTANEOUS | 2 refills | Status: DC

## 2022-09-03 NOTE — Assessment & Plan Note (Signed)
Resolved and off anticoagulant

## 2022-09-03 NOTE — Assessment & Plan Note (Signed)
Not well-controlled will continue with lifestyle approach maintain losartan HCT and see patient back short-term follow-up

## 2022-09-03 NOTE — Patient Instructions (Signed)
REfills on medications sent to mail order  Trial Wegovy one injection weekly  will do prior authorization  may not be covered Return 2 months for blood pressure Labs kidney function

## 2022-09-03 NOTE — Assessment & Plan Note (Addendum)
Progressive weight gain with effects on hypertension and arthritis in multiple areas.  Will see if her insurance will cover wegovy  and we ordered same.  I did review lifestyle medicine approach The following Lifestyle Medicine recommendations according to American College of Lifestyle Medicine [facility]) were discussed and offered to patient who agrees to start the journey:  A. Whole Foods, Plant-based plate comprising of fruits and vegetables, plant-based proteins, whole-grain carbohydrates was discussed in detail with the patient.   A list for source of those nutrients were also provided to the patient.  Patient will use only water or unsweetened tea for hydration. B.  The need to stay away from risky substances including alcohol, smoking; obtaining 7 to 9 hours of restorative sleep, at least 150 minutes of moderate intensity exercise weekly, the importance of healthy social connections,  and stress reduction techniques were discussed.

## 2022-09-04 LAB — BMP8+EGFR
BUN/Creatinine Ratio: 14 (ref 12–28)
BUN: 13 mg/dL (ref 8–27)
CO2: 23 mmol/L (ref 20–29)
Calcium: 8.8 mg/dL (ref 8.7–10.3)
Chloride: 103 mmol/L (ref 96–106)
Creatinine, Ser: 0.9 mg/dL (ref 0.57–1.00)
Glucose: 94 mg/dL (ref 70–99)
Potassium: 3.6 mmol/L (ref 3.5–5.2)
Sodium: 139 mmol/L (ref 134–144)
eGFR: 69 mL/min/{1.73_m2} (ref >59)

## 2022-09-04 NOTE — Progress Notes (Signed)
Let pt know labs normal

## 2022-09-06 ENCOUNTER — Telehealth: Payer: Self-pay

## 2022-09-06 NOTE — Telephone Encounter (Signed)
-----   Message from Shan Levans sent at 09/04/2022  5:46 AM EDT ----- Let pt know labs normal

## 2022-09-06 NOTE — Telephone Encounter (Signed)
Patient viewed result in mychart and are aware

## 2022-09-09 ENCOUNTER — Telehealth: Payer: Self-pay | Admitting: Critical Care Medicine

## 2022-09-09 NOTE — Telephone Encounter (Signed)
Copied from CRM [id number]. Topic: General - Inquiry >> Sep 09, 2022  2:16 PM Teressa P wrote: Reason for CRM: pt called wanting to know if she was approved for the weight loss shots and if so which one  [phone]

## 2022-09-09 NOTE — Telephone Encounter (Signed)
Pt would like a 90 day supply of this medication, and wants in the pill form instead of the shot.

## 2022-09-09 NOTE — Telephone Encounter (Signed)
Pt stated she reached out to insurance and now knows what medication they will cover for weight loss. Stated its medication, tirzepatide, for weight loss in patients with hypertension.  Insurance accepts. Pt is requesting it be sent in as soon as possible.  [facility] - [place] - [address]  [address] [place] [address]  Phone: [phone] Fax: [phone]  Hours: Not open 24 hours   Please advise.

## 2022-09-10 ENCOUNTER — Other Ambulatory Visit: Payer: Self-pay

## 2022-09-10 ENCOUNTER — Other Ambulatory Visit (HOSPITAL_COMMUNITY): Payer: Self-pay

## 2022-09-10 DIAGNOSIS — E66812 Obesity, class 2: Secondary | ICD-10-CM

## 2022-09-10 MED ORDER — WEGOVY 0.25 MG/0.5ML ~~LOC~~ SOAJ
0.2500 mg | SUBCUTANEOUS | 2 refills | Status: DC
  Filled 2022-09-10: qty 2, 28d supply, fill #0

## 2022-09-10 NOTE — Telephone Encounter (Signed)
Spoke with patient . Verified name & DOB    Patient voices she was unaware that Semaglutide-Weight Management [facility])  was order by MD. Patient was calling to request that something was being called to her pharmacy. Patient requested that I stay on the line with her while she call her pharmacy. Pharmacy stated that Semaglutide-Weight Management [facility])  was unavailable at their pharmacy. Patient requested that Semaglutide-Weight Management [facility]) be sent to [facility] community pharmacy to be filled.

## 2022-09-11 ENCOUNTER — Telehealth: Payer: Self-pay | Admitting: Critical Care Medicine

## 2022-09-11 NOTE — Telephone Encounter (Signed)
Patient stated her insurance is needing a prior auth for Semaglutide-Weight Management (WEGOVY) 0.25 MG/0.5ML SOAJ and insurance faxed over info last night and patient wanted provider to be aware so it can be completed for her to her the medication. Please contact patient per her request once they get the form completed and faxed back to insurance

## 2022-09-11 NOTE — Telephone Encounter (Signed)
Hey friend,   For when you return, not urgent. Can we attempt a PA for this patient or do you think [place] likely to receive a denial?

## 2022-09-11 NOTE — Telephone Encounter (Signed)
Wegovy rerouted to our pharmacy here at [facility].

## 2022-09-12 ENCOUNTER — Other Ambulatory Visit: Payer: Self-pay

## 2022-09-12 ENCOUNTER — Other Ambulatory Visit (HOSPITAL_COMMUNITY): Payer: Self-pay

## 2022-09-13 NOTE — Telephone Encounter (Signed)
Patient is aware and we have been faxed PA form and PCP needs need to sign   They are on your desk

## 2022-09-13 NOTE — Telephone Encounter (Signed)
Let pt know her medicare plan will not cover [facility]

## 2022-09-13 NOTE — Telephone Encounter (Signed)
See other message

## 2022-09-16 ENCOUNTER — Other Ambulatory Visit: Payer: Self-pay

## 2022-09-17 ENCOUNTER — Other Ambulatory Visit: Payer: Self-pay

## 2022-09-17 ENCOUNTER — Telehealth: Payer: Self-pay

## 2022-09-17 NOTE — Telephone Encounter (Signed)
A prior authorization for Reginal Lutes has been submitted to insurance today via CoverMyMeds Key: [id number]

## 2022-09-18 ENCOUNTER — Other Ambulatory Visit: Payer: Self-pay

## 2022-09-19 ENCOUNTER — Other Ambulatory Visit: Payer: Self-pay

## 2022-09-19 ENCOUNTER — Ambulatory Visit
Admission: RE | Admit: 2022-09-19 | Discharge: 2022-09-19 | Disposition: A | Discharge status: Home / Self Care | Payer: Medicare Other | Source: Ambulatory Visit | Attending: Critical Care Medicine | Admitting: Critical Care Medicine

## 2022-09-19 DIAGNOSIS — Z1231 Encounter for screening mammogram for malignant neoplasm of breast: Secondary | ICD-10-CM | POA: Diagnosis not present

## 2022-09-23 NOTE — Progress Notes (Signed)
Let pt know mammogram normal repeat one year

## 2022-09-27 ENCOUNTER — Telehealth: Payer: Self-pay

## 2022-09-27 NOTE — Telephone Encounter (Signed)
Pt was called and no vm was left due to mailbox being full. Information has been sent to nurse pool.  

## 2022-09-27 NOTE — Telephone Encounter (Signed)
-----   Message from Shan Levans sent at 09/23/2022 11:10 AM EDT ----- Let pt know mammogram normal repeat one year

## 2022-11-01 ENCOUNTER — Encounter: Payer: Self-pay | Admitting: Pharmacist

## 2022-11-04 NOTE — Progress Notes (Unsigned)
Established Patient Office Visit  Subjective   Patient ID: Cheryl Prince, female    DOB: Aug 17, 1955  Age: 66 y.o. MRN: [id number]    No chief complaint on file.   07/26/20 Versie Starks Iziokhaipresents for PCP to f/u [facility]. The patient has been experiencingfrequentchronic right shoulder and right hip pain for a few months. The right shoulder pain occurs throughout the shoulder area and joint.The pain is worse with movement and improves with keeping the right shoulder still. The patient takes Tylenol and gabapentin for the pain which is helpful. The patient has seen orthopedics in the past for this pain and was told she may need a right shoulder replacement in the future. She also states she saw physical therapy in the past for her shoulder which was helpful.Thepatient'sright hip pain occurs mostly in the anterior and lateral aspect of the right hip. The pain improves with taking Tylenol and gabapentin. The pain is exacerbated by walking on the treadmill. She has seen physical therapy in the past for the right hip pain which was helpful. She would like a referral for physical therapy today.  The patient has a history of hypertension which is well controlled on her current dose of HCTZ.  The patient has chronic bronchitis for which she takes Flovent while experiencing symptoms and albuterol as needed.  The patient has multiple seasonal and food allergies which cause itching. The patient takes hydroxyzine and Singulair for these symptoms.  The patient has daily gastroesophageal reflux symptoms for which she takes Pepcid and Compazine.   The patient's medical record reveals a diagnosis of bipolar 1 disorder, though the patient denies having this diagnosis. She admits to having PTSD from [employer].  12/12/2020 Since the last visit in June the patient has been doing better. She is undergoing physical therapy and her right hip and left shoulder are  improved.She is joined the [employer] she is doing swimming and other exercises she has a home gym as well she has lost weight she is got an air [place] and trying to improve her diet. She is requesting a new electric lift chair because she has difficulty rising from a sitting position.  She does have an upcoming DEXA scan and mammogram in December. Patient has no other real complaints blood pressures been stable.  03/19/21 Since the last visit in October the patient's undergone a great deal of stress and that she lost a cousin in a parent and short periods of time over 2 months. On arrival blood pressure elevated 176/107. She is drinking several vodkas a day as well. Patient is gained some weight and is no longer exercising at the [facility] as she had previously. She is on the hydrochlorothiazide alone 25 mg. Her asthma is stable at this time on the Flovent and montelukast and as needed albuterol. Patient declines to receive any vaccines as she has a prior history of hypersensitivity to all vaccines.  Her diet is also not been very compliant and she is eating a lot of carbohydrates and salt foods. After resting for 10 minutes I rechecked her blood pressure and it was still elevated as previous.  05/02/2021 Patient is seen in return follow-up she does have some chronic right knee pain.  The patient's overall mood and stress levels have improved.  She is about to travel to [place] to see her brother for his birthday.  She is trying to eat a healthier diet.  She knows she has a mammogram that is coming  up in July of this year.  On arrival blood pressure 138/80.  The patient's been careful to watch her sugar and salt intake but does need improvement in this regard.  Patient is compliant with her blood pressure medications.  She remains on the losartan HCT 100/25.  Patient does need refills on her medications.  She does use the Singulair as needed hydroxyzine as needed Flovent is being used along  with Pepcid.  Gabapentin is twice daily as well. 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  She has been working 6 hours a day 3 days a week but no new management wanted to work 5 days a week 6 hours each.  She has significant arthritis in the right knee neck and shoulder.  Because of this she can only work the 3-day week 6-hour day schedule.  She is  needing a letter stating as such.  8/8 Patient seen in return follow-up she has been monitoring blood pressure at home and has written improved numbers today on arrival 110/78 her blood pressures on her home meter are in similar range.  She has a planned total right knee replacement in October.  She is interested in getting PCS services.  Patient has no other complaints at this visit.  03/28/22 Patient seen in return follow-up she is doing very well from her right total knee replacement which occurred in October.  She still has a little neuropathy in the leg.  She has good range of motion.  She did have a DVT post hospital postop and took a course of apixaban she is now off this without symptoms.  Patient does have heartburn symptoms would like this evaluated.  Blood pressure on arrival is good 126/87.  09/03/22 The patient returns in follow-up her neuropathy of the right knee after knee replacement has resolved.  She is trying to lose some weight.  BMI is up to at 38.  She does go to the bariatric clinic.  Blood pressure on arrival today is also elevated 143/86.  She has remained compliant with hydrochlorothiazide losartan.  She is trying to follow a healthier diet.  She is under some degree of stress as her father is not doing well and she needs to go  to [place] to check on him.  Heartburn symptoms are improved.  9/10    Patient Active Problem List   Diagnosis Date Noted  . Class 2 severe obesity due to excess calories with serious comorbidity and body mass index (BMI) of 38.0 to 38.9 in adult [facility]) 03/28/2022  . Status post total left knee replacement 03/28/2022  . Urticaria 03/16/2022  . Pruritic dermatitis 03/16/2022  . Allergic reaction to food 03/16/2022  . S/P total knee arthroplasty, right 12/26/2021  . Right hip pain 07/26/2020  . Allergies 07/26/2020  . GERD (gastroesophageal reflux disease) 07/26/2020  . At risk for decreased bone density 07/26/2020  . PTSD (post-traumatic stress disorder) 05/30/2016  . Alpha thalassemia trait 05/30/2016  . Hypertension 05/30/2016  . Rotator cuff tendonitis, left 05/30/2016  . Carpal tunnel syndrome 05/30/2016  . Prediabetes 05/30/2016  . Onychomycosis 05/30/2016   Past Medical History:  Diagnosis Date  . Allergy   .  Anemia   . Anxiety   . Arthritis   . Asthma    borderline bronchitis  . Bipolar I disorder (HCC) 07/26/2020  . Bronchitis   . Deep venous thrombosis (HCC) 12/26/2021  . GERD (gastroesophageal reflux disease)   . Goiter, toxic diffuse 05/30/2016  . Hypertension   . S/P tonsillectomy 05/30/2016  . Sleep apnea    history of, MD tookpt. off machine 7 yrs, ago   Past Surgical History:  Procedure Laterality Date  . ABDOMINAL HYSTERECTOMY    . adnoides    . BREAST SURGERY    . CHOLECYSTECTOMY    . COLONOSCOPY    . DILATION AND CURETTAGE OF UTERUS    . GASTRIC BYPASS  1998  . IUD REMOVAL    . TONSILLECTOMY    . TOTAL KNEE ARTHROPLASTY Left 09/22/2018  . TOTAL KNEE ARTHROPLASTY Left 09/22/2018   Procedure: LEFT TOTAL KNEE ARTHROPLASTY;  Surgeon: Cammy Copa, MD;  Location: [facility] OR;  Service: Orthopedics;  Laterality: Left;  . TOTAL KNEE ARTHROPLASTY Right 11/29/2021   Procedure: RIGHT TOTAL KNEE ARTHROPLASTY;  Surgeon: Cammy Copa, MD;   Location: [facility] OR;  Service: Orthopedics;  Laterality: Right;   Social History   Tobacco Use  . Smoking status: Former    Current packs/day: 0.00    Average packs/day: 1.5 packs/day for 18.0 years (27.0 ttl pk-yrs)    Types: Cigarettes    Start date: 09/17/1966    Quit date: 09/16/1984    Years since quitting: 38.1  . Smokeless tobacco: Never  . Tobacco comments:    quit 1986  Vaping Use  . Vaping status: Never Used  Substance Use Topics  . Alcohol use: Yes    Comment: social  . Drug use: No   Social History   Socioeconomic History  . Marital status: Single    Spouse name: Not on file  . Number of children: Not on file  . Years of education: Not on file  . Highest education level: Not on file  Occupational History  . Not on file  Tobacco Use  . Smoking status: Former    Current packs/day: 0.00    Average packs/day: 1.5 packs/day for 18.0 years (27.0 ttl pk-yrs)    Types: Cigarettes    Start date: 09/17/1966    Quit date: 09/16/1984    Years since quitting: 38.1  . Smokeless tobacco: Never  . Tobacco comments:    quit 1986  Vaping Use  . Vaping status: Never Used  Substance and Sexual Activity  . Alcohol use: Yes    Comment: social  . Drug use: No  . Sexual activity: Not on file  Other Topics Concern  . Not on file  Social History Narrative  . Not on file   Social Determinants of Health   Financial Resource Strain: Low Risk  (05/01/2022)   Overall Financial Resource Strain (CARDIA)   . Difficulty of Paying Living Expenses: Not hard at all  Food Insecurity: No Food Insecurity (05/01/2022)   Hunger Vital Sign   . Worried About Programme researcher, broadcasting/film/video in the Last Year: Never true   . Ran Out of Food in the Last Year: Never true  Transportation Needs: No Transportation Needs (05/01/2022)   PRAPARE - Transportation   . Lack of Transportation (Medical): No   . Lack of Transportation (Non-Medical): No  Physical Activity: Insufficiently Active (05/01/2022)   Exercise Vital  Sign   . Days of Exercise per Week: 7 days   .  Minutes of Exercise per Session: 20 min  Stress: No Stress Concern Present (05/01/2022)   [employer] of Occupational Health - Occupational Stress Questionnaire   . Feeling of Stress : Not at all  Social Connections: Not on file  Intimate Partner Violence: Patient Declined (11/30/2021)   Humiliation, Afraid, Rape, and Kick questionnaire   . Fear of Current or Ex-Partner: Patient declined   . Emotionally Abused: Patient declined   . Physically Abused: Patient declined   . Sexually Abused: Patient declined   Family Status  Relation Name Status  . Mother  Deceased  . Father  Alive       per pt father have a Pacemaker  . Sister  Other       Carrier of Sickle Cell Anemia  . Daughter  Alive  . Mat [employer]  . Mat Uncle  (Not Specified)  . Neg Hx  (Not Specified)  No partnership data on file   Family History  Problem Relation Age of Onset  . Hypertension Father   . Diabetes Father   . Lupus Daughter   . Colon cancer Maternal Aunt   . Colon cancer Maternal Uncle        4 mat uncles dx colon ca  . Esophageal cancer Neg Hx   . Rectal cancer Neg Hx   . Stomach cancer Neg Hx    Allergies  Allergen Reactions  . Morphine And Codeine Diarrhea, Nausea And Vomiting and Swelling  . Penicillins Swelling and Other (See Comments)    Comatose for 2 days   . Pine Shortness Of Breath  . Shellfish Allergy Shortness Of Breath and Swelling  . Lactose Intolerance (Gi) Other (See Comments)    GI upset  . Miralax [Polyethylene Glycol] Itching  . Nickel Other (See Comments)    Pt states she gets abscess from the earrings with nickel   . Other Itching and Nausea And Vomiting    Mushrooms - feels high, and dizzy   . Grapeseed Extract [Nutritional Supplements] Rash    grapes  . Latex Itching and Rash    "IF" condom, it makes it painful  . Red Dye #40 (Allura Red) Swelling and Rash    The dye for checking thyroid.  Red Cast Dye  . Sulfites  Hives and Rash    Tingling in mouth  . Wheat Rash and Other (See Comments)     tingling in mouth, vomiting      Review of Systems  Constitutional:  Negative for chills, diaphoresis, fever, malaise/fatigue and weight loss.  HENT:  Negative for congestion, hearing loss, nosebleeds, sore throat and tinnitus.   Eyes:  Negative for blurred vision, photophobia and redness.  Respiratory:  Negative for cough, hemoptysis, sputum production, shortness of breath, wheezing and stridor.   Cardiovascular:  Negative for chest pain, palpitations, orthopnea, claudication, leg swelling and PND.  Gastrointestinal:  Negative for abdominal pain, blood in stool, constipation, diarrhea, heartburn, nausea and vomiting.  Genitourinary:  Negative for dysuria, flank pain, frequency, hematuria and urgency.  Musculoskeletal:  Negative for back pain, falls, joint pain, myalgias and neck pain.  Skin:  Negative for itching and rash.  Neurological:  Negative for dizziness, tingling, tremors, sensory change, speech change, focal weakness, seizures, loss of consciousness, weakness and headaches.  Endo/Heme/Allergies:  Negative for environmental allergies and polydipsia. Does not bruise/bleed easily.  Psychiatric/Behavioral:  Negative for depression, memory loss, substance abuse and suicidal ideas. The patient is not nervous/anxious and does not have insomnia.  Objective:     There were no vitals taken for this visit. BP Readings from Last 3 Encounters:  09/03/22 (!) 143/86  03/28/22 126/87  03/16/22 (!) 158/89   Wt Readings from Last 3 Encounters:  09/03/22 215 lb 3.2 oz (97.6 kg)  05/01/22 217 lb (98.4 kg)  03/28/22 220 lb 12.8 oz (100.2 kg)      Physical Exam Vitals reviewed.  Constitutional:      Appearance: Normal appearance. She is well-developed. She is obese. She is not diaphoretic.  HENT:     Head: Normocephalic and atraumatic.     Nose: No nasal deformity, septal deviation, mucosal edema or  rhinorrhea.     Right Sinus: No maxillary sinus tenderness or frontal sinus tenderness.     Left Sinus: No maxillary sinus tenderness or frontal sinus tenderness.     Mouth/Throat:     Pharynx: No oropharyngeal exudate.  Eyes:     General: No scleral icterus.    Conjunctiva/sclera: Conjunctivae normal.     Pupils: Pupils are equal, round, and reactive to light.  Neck:     Thyroid: No thyromegaly.     Vascular: No carotid bruit or JVD.     Trachea: Trachea normal. No tracheal tenderness or tracheal deviation.  Cardiovascular:     Rate and Rhythm: Normal rate and regular rhythm.     Chest Wall: PMI is not displaced.     Pulses: Normal pulses. No decreased pulses.     Heart sounds: Normal heart sounds, S1 normal and S2 normal. Heart sounds not distant. No murmur heard.    No systolic murmur is present.     No diastolic murmur is present.     No friction rub. No gallop. No S3 or S4 sounds.  Pulmonary:     Effort: No tachypnea, accessory muscle usage or respiratory distress.     Breath sounds: No stridor. No decreased breath sounds, wheezing, rhonchi or rales.  Chest:     Chest wall: No tenderness.  Abdominal:     General: Bowel sounds are normal. There is no distension.     Palpations: Abdomen is soft. Abdomen is not rigid.     Tenderness: There is no abdominal tenderness. There is no guarding or rebound.  Musculoskeletal:        General: No deformity. Normal range of motion.     Cervical back: Normal range of motion and neck supple. No edema, erythema or rigidity. No muscular tenderness. Normal range of motion.     Comments: Wound healed up on the right total joint replacement of the knee and good range of motion  Lymphadenopathy:     Head:     Right side of head: No submental or submandibular adenopathy.     Left side of head: No submental or submandibular adenopathy.     Cervical: No cervical adenopathy.  Skin:    General: Skin is warm and dry.     Coloration: Skin is not  pale.     Findings: No rash.     Nails: There is no clubbing.  Neurological:     Mental Status: She is alert and oriented to person, place, and time.     Sensory: No sensory deficit.  Psychiatric:        Speech: Speech normal.        Behavior: Behavior normal.   No exam this is a phone visit  No results found for any visits on 11/05/22.  Last CBC Lab Results  Component  Value Date   WBC 4.1 11/20/2021   HGB 11.1 (L) 11/20/2021   HCT 35.2 (L) 11/20/2021   MCV 77.2 (L) 11/20/2021   MCH 24.3 (L) 11/20/2021   RDW 15.7 (H) 11/20/2021   PLT 260 11/20/2021   Last metabolic panel Lab Results  Component Value Date   GLUCOSE 94 09/03/2022   NA 139 09/03/2022   K 3.6 09/03/2022   CL 103 09/03/2022   CO2 23 09/03/2022   BUN 13 09/03/2022   CREATININE 0.90 09/03/2022   GFRNONAA >60 11/20/2021   CALCIUM 8.8 09/03/2022   PROT 7.9 10/02/2021   ALBUMIN 4.6 10/02/2021   LABGLOB 3.3 10/02/2021   AGRATIO 1.4 10/02/2021   BILITOT 0.6 10/02/2021   ALKPHOS 110 10/02/2021   AST 22 10/02/2021   ALT 18 10/02/2021   ANIONGAP 5 11/20/2021   Last lipids Lab Results  Component Value Date   CHOL 187 10/02/2021   HDL 61 10/02/2021   LDLCALC 110 (H) 10/02/2021   TRIG 90 10/02/2021   CHOLHDL 3.1 10/02/2021   Last hemoglobin A1c Lab Results  Component Value Date   HGBA1C 5.7 (H) 10/02/2021   Last thyroid functions Lab Results  Component Value Date   TSH 1.210 12/11/2016   The 10-year ASCVD risk score (Arnett DK, et al., 2019) is: 13.3%    Assessment & Plan:   Problem List Items Addressed This Visit   None  Follow-up in 2 months 30 minutes spent extra time needed assessing multiple problems Shan Levans

## 2022-11-05 ENCOUNTER — Ambulatory Visit: Payer: Medicare Other | Attending: Critical Care Medicine | Admitting: Critical Care Medicine

## 2022-11-05 ENCOUNTER — Encounter: Payer: Self-pay | Admitting: Critical Care Medicine

## 2022-11-05 VITALS — BP 103/64 | HR 67 | Wt 205.2 lb

## 2022-11-05 DIAGNOSIS — I10 Essential (primary) hypertension: Secondary | ICD-10-CM | POA: Diagnosis not present

## 2022-11-05 DIAGNOSIS — K219 Gastro-esophageal reflux disease without esophagitis: Secondary | ICD-10-CM | POA: Diagnosis not present

## 2022-11-05 DIAGNOSIS — R7303 Prediabetes: Secondary | ICD-10-CM

## 2022-11-05 DIAGNOSIS — Z760 Encounter for issue of repeat prescription: Secondary | ICD-10-CM

## 2022-11-05 MED ORDER — PANTOPRAZOLE SODIUM 40 MG PO TBEC: 40.0000 mg | DELAYED_RELEASE_TABLET | Freq: Every day | ORAL | 2 refills | Status: DC

## 2022-11-05 MED ORDER — ALBUTEROL SULFATE HFA 108 (90 BASE) MCG/ACT IN AERS: 2.0000 | INHALATION_SPRAY | Freq: Four times a day (QID) | RESPIRATORY_TRACT | 1 refills | Status: AC | PRN

## 2022-11-05 MED ORDER — HYDROXYZINE HCL 10 MG PO TABS: ORAL_TABLET | ORAL | 0 refills | Status: DC

## 2022-11-05 MED ORDER — MONTELUKAST SODIUM 10 MG PO TABS: ORAL_TABLET | ORAL | 3 refills | Status: DC

## 2022-11-05 MED ORDER — LOSARTAN POTASSIUM-HCTZ 50-12.5 MG PO TABS: 1.0000 | ORAL_TABLET | Freq: Every day | ORAL | 3 refills | Status: DC

## 2022-11-05 NOTE — Assessment & Plan Note (Signed)
Continue pantoprazole. °

## 2022-11-05 NOTE — Patient Instructions (Signed)
Medications refilled reduced losartan hct to 50/12.5 daily Return 6 months

## 2022-11-05 NOTE — Assessment & Plan Note (Signed)
Controlled off medication

## 2022-11-05 NOTE — Assessment & Plan Note (Signed)
Hypertension at goal congratulated patient as she is taken a lifestyle approach with changes in weight loss also the new injectable non-insulin has helped significantly that she is taking  Continue with losartan HCT but reduce dose to 50/12.5

## 2023-01-29 ENCOUNTER — Telehealth: Payer: Self-pay | Admitting: Critical Care Medicine

## 2023-01-29 ENCOUNTER — Other Ambulatory Visit: Payer: Self-pay

## 2023-01-29 ENCOUNTER — Ambulatory Visit: Payer: Self-pay

## 2023-01-29 DIAGNOSIS — I10 Essential (primary) hypertension: Secondary | ICD-10-CM

## 2023-01-29 DIAGNOSIS — E66812 Obesity, class 2: Secondary | ICD-10-CM

## 2023-01-29 NOTE — Telephone Encounter (Signed)
Message from Matawan H sent at 01/29/2023  9:47 AM EST  Pt states her bp has been going up and down.  She doesn't like to keep checking it all the time.  Pt states she has lost 30 lbs and Dr Delford Field had already cut her bp med in half before he left.  Pt doesn't like the fact she has to keep an eye on her bp.  Her appt w/ new dr is not until Feb. ** The message may be incomplete. It was sent as a result of a timeout. **  Second attempt to contact pt. Unable to LM, VM full.

## 2023-01-29 NOTE — Telephone Encounter (Addendum)
Message from Channing H sent at 01/29/2023  9:47 AM EST  Pt states her bp has been going up and down.  She doesn't like to keep checking it all the time.  Pt states she has lost 30 lbs and Dr Delford Field had already cut her bp med in half before he left.  Pt doesn't like the fact she has to keep an eye on her bp.  Her appt w/ new dr is not until Feb. ** The message may be incomplete. It was sent as a result of a timeout. **  Third attempt made to call pt. Unable to LM, VM full. Reason for Disposition . Third attempt to contact caller AND no contact made. Phone number verified.  Protocols used: No Contact or Duplicate Contact Call-A-AH

## 2023-01-29 NOTE — Telephone Encounter (Signed)
Spoke with provider referral placed for PREP.

## 2023-01-29 NOTE — Telephone Encounter (Signed)
Referral Request - Did the patient discuss referral with their provider in the last year? Yes (If No - schedule appointment) (If Yes - send message)  Appointment offered? No  Type of order/referral and detailed reason for visit: pt states she has lost a lot of weight and does not want to gain it back.  Preference of office, provider, location: Viann Fish  [phone].  works out of [employer].  Wellness nurse, part  of [facility]  If referral order, have you been seen by this specialty before? No  pt used to see Dr Delford Field, and her next appt w/ Dr Alvis Lemmings is not until March 2025. (If Yes, this issue or another issue? When? Where?  Can we respond through MyChart? No

## 2023-01-29 NOTE — Telephone Encounter (Signed)
Message from Cheryl Prince sent at 01/29/2023  9:47 AM EST  Pt states her bp has been going up and down.  She doesn't like to keep checking it all the time.  Pt states she has lost 30 lbs and Dr Delford Field had already cut her bp med in half before he left.  Pt doesn't like the fact she has to keep an eye on her bp.  Her appt w/ new dr is not until Feb. ** The message may be incomplete. It was sent as a result of a timeout. **  Called pt but mailbox is full and could not leave a message.

## 2023-02-05 ENCOUNTER — Telehealth: Payer: Self-pay | Admitting: *Deleted

## 2023-02-05 NOTE — Telephone Encounter (Signed)
Contacted regarding PREP Class referral. Voice mail full. Unable to leave message.

## 2023-02-10 ENCOUNTER — Ambulatory Visit: Payer: Self-pay | Admitting: *Deleted

## 2023-02-10 NOTE — Telephone Encounter (Signed)
Third attempt to call patient- no answer and mailbox is full. Unable to leave call back message.

## 2023-02-10 NOTE — Telephone Encounter (Signed)
Summary: medication question   Pt is taking Losartan and she thinks it is making her dizzy. She needs a nurse call her back.  CB@  [phone]          Called patient for 2nd attempt to review medication and dizziness. Unable to leave message. VM box full.

## 2023-02-10 NOTE — Telephone Encounter (Signed)
Spoke with patient . Patient voiced that this am 99/ 63 and 101/64 now. Patient voiced that she was dizzy and she know it her BP. Patient voiced she was not going to take the water pill clarified that then patient was taking about Hyzaar. Patient voiced that she was seeing doctor Delford Field. She had an appointment with Dr. Alvis Lemmings and it was concerned and she not given an appointment until February. Patient given an appointment for 02/11/2023

## 2023-02-10 NOTE — Telephone Encounter (Signed)
Summary: medication question   Pt is taking Losartan and she thinks it is making her dizzy. She needs a nurse call her back.  CB@  [phone]         Attempted to call patient- no answer and mailbox is full- unable to leave call back message

## 2023-02-11 ENCOUNTER — Ambulatory Visit: Payer: Medicare Other | Attending: Family Medicine | Admitting: Family Medicine

## 2023-02-11 ENCOUNTER — Encounter: Payer: Self-pay | Admitting: Family Medicine

## 2023-02-11 VITALS — BP 133/82 | HR 80 | Ht 63.0 in | Wt 186.4 lb

## 2023-02-11 DIAGNOSIS — M25512 Pain in left shoulder: Secondary | ICD-10-CM

## 2023-02-11 DIAGNOSIS — G8929 Other chronic pain: Secondary | ICD-10-CM

## 2023-02-11 DIAGNOSIS — I10 Essential (primary) hypertension: Secondary | ICD-10-CM | POA: Diagnosis not present

## 2023-02-11 DIAGNOSIS — E669 Obesity, unspecified: Secondary | ICD-10-CM

## 2023-02-11 NOTE — Patient Instructions (Signed)
Managing Your Hypertension Hypertension, also called high blood pressure, is when the force of the blood pressing against the walls of the arteries is too strong. Arteries are blood vessels that carry blood from your heart throughout your body. Hypertension forces the heart to work harder to pump blood and may cause the arteries to become narrow or stiff. Understanding blood pressure readings A blood pressure reading includes a higher number over a lower number: The first, or top, number is called the systolic pressure. It is a measure of the pressure in your arteries as your heart beats. The second, or bottom number, is called the diastolic pressure. It is a measure of the pressure in your arteries as the heart relaxes. For most people, a normal blood pressure is below 120/80. Your personal target blood pressure may vary depending on your medical conditions, your age, and other factors. Blood pressure is classified into four stages. Based on your blood pressure reading, your health care provider may use the following stages to determine what type of treatment you need, if any. Systolic pressure and diastolic pressure are measured in a unit called millimeters of mercury (mmHg). Normal Systolic pressure: below 120. Diastolic pressure: below 80. Elevated Systolic pressure: 120-129. Diastolic pressure: below 80. Hypertension stage 1 Systolic pressure: 130-139. Diastolic pressure: 80-89. Hypertension stage 2 Systolic pressure: 140 or above. Diastolic pressure: 90 or above. How can this condition affect me? Managing your hypertension is very important. Over time, hypertension can damage the arteries and decrease blood flow to parts of the body, including the brain, heart, and kidneys. Having untreated or uncontrolled hypertension can lead to: A heart attack. A stroke. A weakened blood vessel (aneurysm). Heart failure. Kidney damage. Eye damage. Memory and concentration problems. Vascular  dementia. What actions can I take to manage this condition? Hypertension can be managed by making lifestyle changes and possibly by taking medicines. Your health care provider will help you make a plan to bring your blood pressure within a normal range. You may be referred for counseling on a healthy diet and physical activity. Nutrition  Eat a diet that is high in fiber and potassium, and low in salt (sodium), added sugar, and fat. An example eating plan is called the DASH diet. DASH stands for Dietary Approaches to Stop Hypertension. To eat this way: Eat plenty of fresh fruits and vegetables. Try to fill one-half of your plate at each meal with fruits and vegetables. Eat whole grains, such as whole-wheat pasta, brown rice, or whole-grain bread. Fill about one-fourth of your plate with whole grains. Eat low-fat dairy products. Avoid fatty cuts of meat, processed or cured meats, and poultry with skin. Fill about one-fourth of your plate with lean proteins such as fish, chicken without skin, beans, eggs, and tofu. Avoid pre-made and processed foods. These tend to be higher in sodium, added sugar, and fat. Reduce your daily sodium intake. Many people with hypertension should eat less than 1,500 mg of sodium a day. Lifestyle  Work with your health care provider to maintain a healthy body weight or to lose weight. Ask what an ideal weight is for you. Get at least 30 minutes of exercise that causes your heart to beat faster (aerobic exercise) most days of the week. Activities may include walking, swimming, or biking. Include exercise to strengthen your muscles (resistance exercise), such as weight lifting, as part of your weekly exercise routine. Try to do these types of exercises for 30 minutes at least 3 days a week. Do   not use any products that contain nicotine or tobacco. These products include cigarettes, chewing tobacco, and vaping devices, such as e-cigarettes. If you need help quitting, ask your  health care provider. Control any long-term (chronic) conditions you have, such as high cholesterol or diabetes. Identify your sources of stress and find ways to manage stress. This may include meditation, deep breathing, or making time for fun activities. Alcohol use Do not drink alcohol if: Your health care provider tells you not to drink. You are pregnant, may be pregnant, or are planning to become pregnant. If you drink alcohol: Limit how much you have to: 0-1 drink a day for women. 0-2 drinks a day for men. Know how much alcohol is in your drink. In the [place] one drink equals one 12 oz bottle of beer (355 mL), one 5 oz glass of wine (148 mL), or one 1 oz glass of hard liquor (44 mL). Medicines Your health care provider may prescribe medicine if lifestyle changes are not enough to get your blood pressure under control and if: Your systolic blood pressure is 130 or higher. Your diastolic blood pressure is 80 or higher. Take medicines only as told by your health care provider. Follow the directions carefully. Blood pressure medicines must be taken as told by your health care provider. The medicine does not work as well when you skip doses. Skipping doses also puts you at risk for problems. Monitoring Before you monitor your blood pressure: Do not smoke, drink caffeinated beverages, or exercise within 30 minutes before taking a measurement. Use the bathroom and empty your bladder (urinate). Sit quietly for at least 5 minutes before taking measurements. Monitor your blood pressure at home as told by your health care provider. To do this: Sit with your back straight and supported. Place your feet flat on the floor. Do not cross your legs. Support your arm on a flat surface, such as a table. Make sure your upper arm is at heart level. Each time you measure, take two or three readings one minute apart and record the results. You may also need to have your blood pressure checked regularly by  your health care provider. General information Talk with your health care provider about your diet, exercise habits, and other lifestyle factors that may be contributing to hypertension. Review all the medicines you take with your health care provider because there may be side effects or interactions. Keep all follow-up visits. Your health care provider can help you create and adjust your plan for managing your high blood pressure. Where to find more information National Heart, Lung, and Blood Institute: www.nhlbi.nih.gov American Heart Association: www.heart.org Contact a health care provider if: You think you are having a reaction to medicines you have taken. You have repeated (recurrent) headaches. You feel dizzy. You have swelling in your ankles. You have trouble with your vision. Get help right away if: You develop a severe headache or confusion. You have unusual weakness or numbness, or you feel faint. You have severe pain in your chest or abdomen. You vomit repeatedly. You have trouble breathing. These symptoms may be an emergency. Get help right away. Call 911. Do not wait to see if the symptoms will go away. Do not drive yourself to the hospital. Summary Hypertension is when the force of blood pumping through your arteries is too strong. If this condition is not controlled, it may put you at risk for serious complications. Your personal target blood pressure may vary depending on your medical conditions,   your age, and other factors. For most people, a normal blood pressure is less than 120/80. Hypertension is managed by lifestyle changes, medicines, or both. Lifestyle changes to help manage hypertension include losing weight, eating a healthy, low-sodium diet, exercising more, stopping smoking, and limiting alcohol. This information is not intended to replace advice given to you by your health care provider. Make sure you discuss any questions you have with your health care  provider. Document Revised: 10/26/2020 Document Reviewed: 10/26/2020 Elsevier Patient Education  2024 Elsevier Inc.  

## 2023-02-11 NOTE — Progress Notes (Signed)
Subjective:  Patient ID: Cheryl Prince, female    DOB: [birth year]  Age: 67 y.o. MRN: [id number]  CC: Medical Management of Chronic Issues (Bp has been low/Discuss PCS)   HPI Cheryl Prince is a 67 y.o. year old female with a history of hypertension, chronic shoulder pain, right knee replacement  Interval History: Discussed the use of AI scribe software for clinical note transcription with the patient, who gave verbal consent to proceed.  She presents with concerns of low blood pressure readings at home, 'like in the nineties.' She reports feeling 'faint and spacing' and attributes the symptoms to recent weight loss of 15-20 pounds. In response, she discontinued her prescribed losartan/hydrochlorothiazide and started an over-the-counter diuretic for the past two days. She reports that her blood pressure has since improved since discontinuation.  She also mentions a history of shoulder pain and a deformity, which she believes may require surgery in the future. She expresses a desire to maintain her recent weight loss and requests a referral to a nutritionist. She also requests a referral for a home health aide and an exercise program at the [facility].        Past Medical History:  Diagnosis Date   Allergy    Anemia    Anxiety    Arthritis    Asthma    borderline bronchitis   Bipolar I disorder (HCC) 07/26/2020   Bronchitis    Deep venous thrombosis (HCC) 12/26/2021   GERD (gastroesophageal reflux disease)    Goiter, toxic diffuse 05/30/2016   Hypertension    S/P tonsillectomy 05/30/2016   Sleep apnea    history of, MD tookpt. off machine 7 yrs, ago    Past Surgical History:  Procedure Laterality Date   ABDOMINAL HYSTERECTOMY     adnoides     BREAST SURGERY     CHOLECYSTECTOMY     COLONOSCOPY     DILATION AND CURETTAGE OF UTERUS     GASTRIC BYPASS  1998   IUD REMOVAL     TONSILLECTOMY     TOTAL KNEE ARTHROPLASTY Left 09/22/2018   TOTAL KNEE ARTHROPLASTY  Left 09/22/2018   Procedure: LEFT TOTAL KNEE ARTHROPLASTY;  Surgeon: Cammy Copa, MD;  Location: [facility] OR;  Service: Orthopedics;  Laterality: Left;   TOTAL KNEE ARTHROPLASTY Right 11/29/2021   Procedure: RIGHT TOTAL KNEE ARTHROPLASTY;  Surgeon: Cammy Copa, MD;  Location: [facility] OR;  Service: Orthopedics;  Laterality: Right;    Family History  Problem Relation Age of Onset   Hypertension Father    Diabetes Father    Lupus Daughter    Colon cancer Maternal Aunt    Colon cancer Maternal Uncle        4 mat uncles dx colon ca   Esophageal cancer Neg Hx    Rectal cancer Neg Hx    Stomach cancer Neg Hx     Social History   Socioeconomic History   Marital status: Single    Spouse name: Not on file   Number of children: Not on file   Years of education: Not on file   Highest education level: Not on file  Occupational History   Not on file  Tobacco Use   Smoking status: Former    Current packs/day: 0.00    Average packs/day: 1.5 packs/day for 18.0 years (27.0 ttl pk-yrs)    Types: Cigarettes    Start date: 09/17/1966    Quit date: 09/16/1984    Years since quitting: 38.4   Smokeless  tobacco: Never   Tobacco comments:    quit 1986  Vaping Use   Vaping status: Never Used  Substance and Sexual Activity   Alcohol use: Yes    Comment: social   Drug use: No   Sexual activity: Not on file  Other Topics Concern   Not on file  Social History Narrative   Not on file   Social Drivers of Health   Financial Resource Strain: Low Risk  (05/01/2022)   Overall Financial Resource Strain (CARDIA)    Difficulty of Paying Living Expenses: Not hard at all  Food Insecurity: No Food Insecurity (05/01/2022)   Hunger Vital Sign    Worried About Running Out of Food in the Last Year: Never true    Ran Out of Food in the Last Year: Never true  Transportation Needs: No Transportation Needs (05/01/2022)   PRAPARE - Administrator, Civil Service (Medical): No    Lack of  Transportation (Non-Medical): No  Physical Activity: Insufficiently Active (05/01/2022)   Exercise Vital Sign    Days of Exercise per Week: 7 days    Minutes of Exercise per Session: 20 min  Stress: No Stress Concern Present (05/01/2022)   [employer] of Occupational Health - Occupational Stress Questionnaire    Feeling of Stress : Not at all  Social Connections: Not on file    Allergies  Allergen Reactions   Morphine And Codeine Diarrhea, Nausea And Vomiting and Swelling   Penicillins Swelling and Other (See Comments)    Comatose for 2 days    Pine Shortness Of Breath   Shellfish Allergy Shortness Of Breath and Swelling   Lactose Intolerance (Gi) Other (See Comments)    GI upset   Miralax [Polyethylene Glycol] Itching   Nickel Other (See Comments)    Pt states she gets abscess from the earrings with nickel    Other Itching and Nausea And Vomiting    Mushrooms - feels high, and dizzy    Grapeseed Extract [Nutritional Supplements] Rash    grapes   Latex Itching and Rash    "IF" condom, it makes it painful   Red Dye #40 (Allura Red) Swelling and Rash    The dye for checking thyroid.  Red Cast Dye   Sulfites Hives and Rash    Tingling in mouth   Wheat Rash and Other (See Comments)     tingling in mouth, vomiting    Outpatient Medications Prior to Visit  Medication Sig Dispense Refill   acetaminophen (TYLENOL) 325 MG tablet Take 1 tablet (325 mg total) by mouth every 8 (eight) hours. (Patient taking differently: Take 650 mg by mouth 3 (three) times daily as needed (pain).) 100 tablet 2   albuterol (VENTOLIN HFA) 108 (90 Base) MCG/ACT inhaler Inhale 2 puffs into the lungs every 6 (six) hours as needed for wheezing or shortness of breath. 34 g 1   Calcium Carbonate Antacid (TUMS PO) Take 2 tablets by mouth 3 (three) times daily as needed (reflux).     CALCIUM PO Take 1 tablet by mouth in the morning and at bedtime.     cetirizine (ZYRTEC) 10 MG tablet Take 10 mg by mouth  daily.     Cholecalciferol (VITAMIN D3) 50 MCG (2000 UT) CAPS Take 2,000 Units by mouth daily.     fluticasone (FLONASE) 50 MCG/ACT nasal spray Place 1 spray into both nostrils 3 (three) times daily as needed for allergies or rhinitis.     hydrOXYzine (ATARAX) 10 MG  tablet TAKE 1 TABLET BY MOUTH  DAILY AS NEEDED FOR ITCHING OR ANXIETY 90 tablet 0   losartan-hydrochlorothiazide (HYZAAR) 50-12.5 MG tablet Take 1 tablet by mouth daily. 90 tablet 3   montelukast (SINGULAIR) 10 MG tablet TAKE 1 TABLET BY MOUTH  DAILY AS NEEDED FOR  WHEEZING 100 tablet 3   Multiple Vitamin (MULTIVITAMIN) tablet Take 1 tablet by mouth daily.     pantoprazole (PROTONIX) 40 MG tablet Take 1 tablet (40 mg total) by mouth daily. 100 tablet 2   tirzepatide (MOUNJARO) 5 MG/0.5ML Pen Inject 5 mg into the skin once a week.     No facility-administered medications prior to visit.     ROS Review of Systems  Constitutional:  Negative for activity change and appetite change.  HENT:  Negative for sinus pressure and sore throat.   Respiratory:  Negative for chest tightness, shortness of breath and wheezing.   Cardiovascular:  Negative for chest pain and palpitations.  Gastrointestinal:  Negative for abdominal distention, abdominal pain and constipation.  Genitourinary: Negative.   Musculoskeletal:        See HPI  Psychiatric/Behavioral:  Negative for behavioral problems and dysphoric mood.     Objective:  BP 133/82   Pulse 80   Ht 5\' 3"  (1.6 m)   Wt 186 lb 6.4 oz (84.6 kg)   SpO2 100%   BMI 33.02 kg/m      02/11/2023    3:12 PM 02/11/2023    2:40 PM 11/05/2022   10:27 AM  BP/Weight  Systolic BP 133 137 103  Diastolic BP 82 82 64  Wt. (Lbs)  186.4 205.2  BMI  33.02 kg/m2 36.35 kg/m2      Physical Exam Constitutional:      Appearance: She is well-developed.  Cardiovascular:     Rate and Rhythm: Normal rate.     Heart sounds: Normal heart sounds. No murmur heard. Pulmonary:     Effort: Pulmonary  effort is normal.     Breath sounds: Normal breath sounds. No wheezing or rales.  Chest:     Chest wall: No tenderness.  Abdominal:     General: Bowel sounds are normal. There is no distension.     Palpations: Abdomen is soft. There is no mass.     Tenderness: There is no abdominal tenderness.  Musculoskeletal:     Right shoulder: No tenderness or crepitus.     Left shoulder: Tenderness present. Decreased strength.     Right lower leg: No edema.     Left lower leg: No edema.  Neurological:     Mental Status: She is alert and oriented to person, place, and time.  Psychiatric:        Mood and Affect: Mood normal.        Latest Ref Rng & Units 09/03/2022   12:09 PM 11/20/2021   12:00 PM 10/02/2021   11:39 AM  CMP  Glucose 70 - 99 mg/dL 94  98  86   BUN 8 - 27 mg/dL 13  9  16    Creatinine 0.57 - 1.00 mg/dL 1.61  0.96  0.45   Sodium 134 - 144 mmol/L 139  135  141   Potassium 3.5 - 5.2 mmol/L 3.6  3.5  3.9   Chloride 96 - 106 mmol/L 103  104  101   CO2 20 - 29 mmol/L 23  26  22    Calcium 8.7 - 10.3 mg/dL 8.8  8.4  8.9   Total Protein 6.0 -  8.5 g/dL   7.9   Total Bilirubin 0.0 - 1.2 mg/dL   0.6   Alkaline Phos 44 - 121 IU/L   110   AST 0 - 40 IU/L   22   ALT 0 - 32 IU/L   18     Lipid Panel     Component Value Date/Time   CHOL 187 10/02/2021 1139   TRIG 90 10/02/2021 1139   HDL 61 10/02/2021 1139   CHOLHDL 3.1 10/02/2021 1139   LDLCALC 110 (H) 10/02/2021 1139    CBC    Component Value Date/Time   WBC 4.1 11/20/2021 1200   RBC 4.56 11/20/2021 1200   HGB 11.1 (L) 11/20/2021 1200   HGB 11.6 10/02/2021 1139   HCT 35.2 (L) 11/20/2021 1200   HCT 36.6 10/02/2021 1139   PLT 260 11/20/2021 1200   PLT 228 10/02/2021 1139   MCV 77.2 (L) 11/20/2021 1200   MCV 78 (L) 10/02/2021 1139   MCH 24.3 (L) 11/20/2021 1200   MCHC 31.5 11/20/2021 1200   RDW 15.7 (H) 11/20/2021 1200   RDW 15.1 10/02/2021 1139   LYMPHSABS 1.5 10/02/2021 1139   EOSABS 0.1 10/02/2021 1139   BASOSABS 0.0  10/02/2021 1139    Lab Results  Component Value Date   HGBA1C 5.7 (H) 10/02/2021    Assessment & Plan:      Hypertension Patient reported feeling faint and stopped taking Losartan/Hydrochlorothiazide due to low blood pressure readings at home. Blood pressure readings in the office were within normal range. -Hold Losartan/Hydrochlorothiazide for now. -Continue home blood pressure monitoring. -Adhere to a low sodium diet. -Call the office with the name of the over-the-counter diuretic patient has been taking.  Shoulder Pain Patient reported discomfort in the left shoulder, which occasionally locks up. No recent trauma. Patient has a history of joint replacements and a congenital deformity. -Referral to orthopedics for further evaluation and management. -Order PCS services for her.  General Health Maintenance -Referral for home health aid (Patient Care Services). -Referral to [facility] exercise program. -Referral to a nutritionist for weight management and dietary advice.          No orders of the defined types were placed in this encounter.   Follow-up: Return for previously scheduled appointment.       Hoy Register, MD, FAAFP. [facility] [place], [place] [phone]   02/11/2023, 4:20 PM

## 2023-02-12 ENCOUNTER — Other Ambulatory Visit: Payer: Self-pay | Admitting: Family Medicine

## 2023-02-12 ENCOUNTER — Telehealth: Payer: Self-pay | Admitting: *Deleted

## 2023-02-12 LAB — CMP14+EGFR
ALT: 25 [IU]/L (ref 0–32)
AST: 35 [IU]/L (ref 0–40)
Albumin: 4 g/dL (ref 3.9–4.9)
Alkaline Phosphatase: 88 [IU]/L (ref 44–121)
BUN/Creatinine Ratio: 16 (ref 12–28)
BUN: 14 mg/dL (ref 8–27)
Bilirubin Total: 0.5 mg/dL (ref 0.0–1.2)
CO2: 22 mmol/L (ref 20–29)
Calcium: 9.2 mg/dL (ref 8.7–10.3)
Chloride: 101 mmol/L (ref 96–106)
Creatinine, Ser: 0.87 mg/dL (ref 0.57–1.00)
Globulin, Total: 3.5 g/dL (ref 1.5–4.5)
Glucose: 85 mg/dL (ref 70–99)
Potassium: 3.3 mmol/L — ABNORMAL LOW (ref 3.5–5.2)
Sodium: 140 mmol/L (ref 134–144)
Total Protein: 7.5 g/dL (ref 6.0–8.5)
eGFR: 72 mL/min/{1.73_m2} (ref >59)

## 2023-02-12 MED ORDER — POTASSIUM CHLORIDE CRYS ER 10 MEQ PO TBCR: 10.0000 meq | EXTENDED_RELEASE_TABLET | Freq: Two times a day (BID) | ORAL | 1 refills | Status: DC

## 2023-02-12 NOTE — Telephone Encounter (Signed)
Contacted regarding PREP Class referral. She is interested in participating at the [facility]. Class to begin on 03/17/2023 every M/W from 1030-1145 for 12 weeks.

## 2023-03-03 ENCOUNTER — Telehealth: Payer: Self-pay | Admitting: *Deleted

## 2023-03-03 NOTE — Telephone Encounter (Signed)
 Contacted to schedule intake assessment for PREP class beginning 03/17/2023. She is no longer interested in participating in the program.

## 2023-03-05 ENCOUNTER — Other Ambulatory Visit: Payer: Self-pay | Admitting: Pharmacist

## 2023-03-05 DIAGNOSIS — Z760 Encounter for issue of repeat prescription: Secondary | ICD-10-CM

## 2023-03-05 MED ORDER — HYDROXYZINE HCL 10 MG PO TABS: ORAL_TABLET | ORAL | 1 refills | Status: DC

## 2023-03-18 ENCOUNTER — Ambulatory Visit (HOSPITAL_COMMUNITY)
Admission: EM | Admit: 2023-03-18 | Discharge: 2023-03-18 | Disposition: A | Discharge status: Home / Self Care | Payer: Medicare Other | Attending: Family Medicine | Admitting: Family Medicine

## 2023-03-18 ENCOUNTER — Encounter (HOSPITAL_COMMUNITY): Payer: Self-pay | Admitting: *Deleted

## 2023-03-18 ENCOUNTER — Other Ambulatory Visit: Payer: Self-pay

## 2023-03-18 DIAGNOSIS — J0100 Acute maxillary sinusitis, unspecified: Secondary | ICD-10-CM | POA: Diagnosis not present

## 2023-03-18 DIAGNOSIS — Z760 Encounter for issue of repeat prescription: Secondary | ICD-10-CM

## 2023-03-18 MED ORDER — HYDROXYZINE HCL 10 MG PO TABS: ORAL_TABLET | ORAL | 1 refills | Status: DC

## 2023-03-18 MED ORDER — FLUTICASONE PROPIONATE 50 MCG/ACT NA SUSP: 1.0000 | Freq: Three times a day (TID) | NASAL | 0 refills | Status: AC | PRN

## 2023-03-18 MED ORDER — DOXYCYCLINE HYCLATE 100 MG PO CAPS: 100.0000 mg | ORAL_CAPSULE | Freq: Two times a day (BID) | ORAL | 0 refills | Status: AC

## 2023-03-18 NOTE — ED Triage Notes (Signed)
Pt reports sinus infection Sx's for 3 weeks.

## 2023-03-18 NOTE — ED Provider Notes (Signed)
[facility]    CSN: [id number] Arrival date & time: 03/18/23  7829      History   Chief Complaint Chief Complaint  Patient presents with   Chills   Facial Pain   Cough    HPI Cheryl Prince is a 68 y.o. female.    Cough Associated symptoms: fever and rhinorrhea   Associated symptoms: no shortness of breath    Patient is here for 2.5 weeks of sinus congestion, drainage.  She thinks she has a sinus infection that has gone into her chest.  She thought it was getting better, but then worse again.  Having sinus pain, pressure.  Up all night coughing.  Chest hurts from coughing at night.  Her fever broke last week.   She is also requesting a refill of flonase and hydroxyzine today.         Past Medical History:  Diagnosis Date   Allergy    Anemia    Anxiety    Arthritis    Asthma    borderline bronchitis   Bipolar I disorder (HCC) 07/26/2020   Bronchitis    Deep venous thrombosis (HCC) 12/26/2021   GERD (gastroesophageal reflux disease)    Goiter, toxic diffuse 05/30/2016   Hypertension    S/P tonsillectomy 05/30/2016   Sleep apnea    history of, MD tookpt. off machine 7 yrs, ago    Patient Active Problem List   Diagnosis Date Noted   Class 2 severe obesity due to excess calories with serious comorbidity and body mass index (BMI) of 38.0 to 38.9 in adult [facility]) 03/28/2022   Status post total left knee replacement 03/28/2022   Allergic reaction to food 03/16/2022   S/P total knee arthroplasty, right 12/26/2021   Right hip pain 07/26/2020   Allergies 07/26/2020   GERD (gastroesophageal reflux disease) 07/26/2020   At risk for decreased bone density 07/26/2020   PTSD (post-traumatic stress disorder) 05/30/2016   Alpha thalassemia trait 05/30/2016   Hypertension 05/30/2016   Rotator cuff tendonitis, left 05/30/2016   Carpal tunnel syndrome 05/30/2016   Prediabetes 05/30/2016   Onychomycosis 05/30/2016    Past Surgical History:   Procedure Laterality Date   ABDOMINAL HYSTERECTOMY     adnoides     BREAST SURGERY     CHOLECYSTECTOMY     COLONOSCOPY     DILATION AND CURETTAGE OF UTERUS     GASTRIC BYPASS  1998   IUD REMOVAL     TONSILLECTOMY     TOTAL KNEE ARTHROPLASTY Left 09/22/2018   TOTAL KNEE ARTHROPLASTY Left 09/22/2018   Procedure: LEFT TOTAL KNEE ARTHROPLASTY;  Surgeon: Cammy Copa, MD;  Location: [facility] OR;  Service: Orthopedics;  Laterality: Left;   TOTAL KNEE ARTHROPLASTY Right 11/29/2021   Procedure: RIGHT TOTAL KNEE ARTHROPLASTY;  Surgeon: Cammy Copa, MD;  Location: [facility] OR;  Service: Orthopedics;  Laterality: Right;    OB History   No obstetric history on file.      Home Medications    Prior to Admission medications   Medication Sig Start Date End Date Taking? Authorizing Provider  albuterol (VENTOLIN HFA) 108 (90 Base) MCG/ACT inhaler Inhale 2 puffs into the lungs every 6 (six) hours as needed for wheezing or shortness of breath. 11/05/22  Yes Storm Frisk, MD  Calcium Carbonate Antacid (TUMS PO) Take 2 tablets by mouth 3 (three) times daily as needed (reflux).   Yes [provider]  CALCIUM PO Take 1 tablet by mouth in the  morning and at bedtime.   Yes [provider]  cetirizine (ZYRTEC) 10 MG tablet Take 10 mg by mouth daily.   Yes [provider]  Cholecalciferol (VITAMIN D3) 50 MCG (2000 UT) CAPS Take 2,000 Units by mouth daily.   Yes [provider]  fluticasone (FLONASE) 50 MCG/ACT nasal spray Place 1 spray into both nostrils 3 (three) times daily as needed for allergies or rhinitis.   Yes [provider]  Multiple Vitamin (MULTIVITAMIN) tablet Take 1 tablet by mouth daily.   Yes [provider]  pantoprazole (PROTONIX) 40 MG tablet Take 1 tablet (40 mg total) by mouth daily. 11/05/22  Yes Storm Frisk, MD  potassium chloride (KLOR-CON M) 10 MEQ tablet Take 1 tablet (10 mEq total) by mouth 2 (two) times daily.  02/12/23  Yes Hoy Register, MD  tirzepatide [facility]) 5 MG/0.5ML Pen Inject 5 mg into the skin once a week.   Yes [provider]  acetaminophen (TYLENOL) 325 MG tablet Take 1 tablet (325 mg total) by mouth every 8 (eight) hours. Patient taking differently: Take 650 mg by mouth 3 (three) times daily as needed (pain). 05/13/19   Anders Simmonds, PA-C  hydrOXYzine (ATARAX) 10 MG tablet TAKE 1 TABLET BY MOUTH  DAILY AS NEEDED FOR ITCHING OR ANXIETY 03/05/23   Hoy Register, MD  losartan-hydrochlorothiazide (HYZAAR) 50-12.5 MG tablet Take 1 tablet by mouth daily. 11/05/22   Storm Frisk, MD  montelukast (SINGULAIR) 10 MG tablet TAKE 1 TABLET BY MOUTH  DAILY AS NEEDED FOR  WHEEZING 11/05/22   Storm Frisk, MD    Family History Family History  Problem Relation Age of Onset   Hypertension Father    Diabetes Father    Lupus Daughter    Colon cancer Maternal Aunt    Colon cancer Maternal Uncle        4 mat uncles dx colon ca   Esophageal cancer Neg Hx    Rectal cancer Neg Hx    Stomach cancer Neg Hx     Social History Social History   Tobacco Use   Smoking status: Former    Current packs/day: 0.00    Average packs/day: 1.5 packs/day for 18.0 years (27.0 ttl pk-yrs)    Types: Cigarettes    Start date: 09/17/1966    Quit date: 09/16/1984    Years since quitting: 38.5   Smokeless tobacco: Never   Tobacco comments:    quit 1986  Vaping Use   Vaping status: Never Used  Substance Use Topics   Alcohol use: Yes    Comment: social   Drug use: No     Allergies   Morphine and codeine, Penicillins, Pine, Shellfish allergy, Lactose intolerance (gi), Miralax [polyethylene glycol], Nickel, Other, Grapeseed extract [nutritional supplements], Latex, Red dye #40 (allura red), Sulfites, and Wheat   Review of Systems Review of Systems  Constitutional:  Positive for fever.  HENT:  Positive for congestion and rhinorrhea.   Respiratory:  Positive for cough. Negative for  shortness of breath.   Cardiovascular: Negative.   Gastrointestinal: Negative.   Genitourinary: Negative.   Musculoskeletal: Negative.   Psychiatric/Behavioral: Negative.       Physical Exam Triage Vital Signs ED Triage Vitals  Encounter Vitals Group     BP 03/18/23 1022 (!) 154/90     Systolic BP Percentile --      Diastolic BP Percentile --      Pulse Rate 03/18/23 1022 81     Resp 03/18/23  1022 18     Temp 03/18/23 1022 98.8 F (37.1 C)     Temp src --      SpO2 03/18/23 1022 100 %     Weight --      Height --      Head Circumference --      Peak Flow --      Pain Score 03/18/23 1021 5     Pain Loc --      Pain Education --      Exclude from Growth Chart --    No data found.  Updated Vital Signs BP (!) 170/101 (BP Location: Left Arm)   Pulse 66   Temp 98.8 F (37.1 C)   Resp 18   SpO2 100%   Visual Acuity Right Eye Distance:   Left Eye Distance:   Bilateral Distance:    Right Eye Near:   Left Eye Near:    Bilateral Near:     Physical Exam Constitutional:      General: She is not in acute distress.    Appearance: Normal appearance. She is normal weight. She is not ill-appearing.  HENT:     Head: Normocephalic.     Right Ear: Tympanic membrane normal.     Left Ear: Tympanic membrane normal.     Nose: Congestion present.     Right Sinus: Maxillary sinus tenderness and frontal sinus tenderness present.     Left Sinus: Maxillary sinus tenderness and frontal sinus tenderness present.     Mouth/Throat:     Mouth: Mucous membranes are moist.  Cardiovascular:     Rate and Rhythm: Normal rate.  Pulmonary:     Effort: Pulmonary effort is normal.     Breath sounds: Normal breath sounds.  Musculoskeletal:     Cervical back: Normal range of motion and neck supple. No tenderness.  Lymphadenopathy:     Cervical: No cervical adenopathy.  Neurological:     General: No focal deficit present.     Mental Status: She is alert.  Psychiatric:        Mood and  Affect: Mood normal.      UC Treatments / Results  Labs (all labs ordered are listed, but only abnormal results are displayed) Labs Reviewed - No data to display  EKG   Radiology No results found.  Procedures Procedures (including critical care time)  Medications Ordered in UC Medications - No data to display  Initial Impression / Assessment and Plan / UC Course  I have reviewed the triage vital signs and the nursing notes.  Pertinent labs & imaging results that were available during my care of the patient were reviewed by me and considered in my medical decision making (see chart for details).   Final Clinical Impressions(s) / UC Diagnoses   Final diagnoses:  Acute non-recurrent maxillary sinusitis     Discharge Instructions      You were diagnosed with a sinus infection today.  I have sent out an antibiotic to take twice/day x 10 days.  I have refilled flonase and hydroxyzine as well per your request.  Please follow up if not improving.     ED Prescriptions     Medication Sig Dispense Auth. Provider   hydrOXYzine (ATARAX) 10 MG tablet TAKE 1 TABLET BY MOUTH  DAILY AS NEEDED FOR ITCHING OR ANXIETY 90 tablet Jordynn Perrier, MD   fluticasone (FLONASE) 50 MCG/ACT nasal spray Place 1 spray into both nostrils 3 (three) times daily as needed for allergies  or rhinitis. 16 g Cana Mignano, MD   doxycycline (VIBRAMYCIN) 100 MG capsule Take 1 capsule (100 mg total) by mouth 2 (two) times daily. 20 capsule Jannifer Franklin, MD      PDMP not reviewed this encounter.   Jannifer Franklin, MD 03/18/23 1054

## 2023-03-18 NOTE — Discharge Instructions (Signed)
You were diagnosed with a sinus infection today.  I have sent out an antibiotic to take twice/day x 10 days.  I have refilled flonase and hydroxyzine as well per your request.  Please follow up if not improving.

## 2023-04-08 ENCOUNTER — Ambulatory Visit: Payer: Medicare Other | Attending: Family Medicine | Admitting: Family Medicine

## 2023-04-08 ENCOUNTER — Encounter: Payer: Self-pay | Admitting: Family Medicine

## 2023-04-08 VITALS — BP 138/80 | HR 57 | Ht 63.0 in | Wt 176.8 lb

## 2023-04-08 DIAGNOSIS — E876 Hypokalemia: Secondary | ICD-10-CM

## 2023-04-08 DIAGNOSIS — J329 Chronic sinusitis, unspecified: Secondary | ICD-10-CM | POA: Diagnosis not present

## 2023-04-08 DIAGNOSIS — R7303 Prediabetes: Secondary | ICD-10-CM | POA: Diagnosis not present

## 2023-04-08 DIAGNOSIS — I10 Essential (primary) hypertension: Secondary | ICD-10-CM | POA: Diagnosis not present

## 2023-04-08 LAB — POCT GLYCOSYLATED HEMOGLOBIN (HGB A1C): HbA1c, POC (prediabetic range): 5.4 % — AB (ref 5.7–6.4)

## 2023-04-08 MED ORDER — PREDNISONE 20 MG PO TABS: 20.0000 mg | ORAL_TABLET | Freq: Every day | ORAL | 0 refills | Status: DC

## 2023-04-08 NOTE — Progress Notes (Signed)
Subjective:  Patient ID: Cheryl Prince, female    DOB: Jul 01, 1955  Age: 68 y.o. MRN: [id number]  CC: Medical Management of Chronic Issues   HPI Cheryl Prince is a 68 y.o. year old female with a history of hypertension, chronic shoulder pain, right knee replacement   Interval History: Discussed the use of AI scribe software for clinical note transcription with the patient, who gave verbal consent to proceed.   She presents with ongoing symptoms of sinus infection which led to an urgent care visit where she was prescribed Flonase. Her symptoms have shown slow improvement over the past three weeks. The patient reports persistent congestion and mucus drainage, but no longer experiences wheezing or coughing. The patient has been using over-the-counter medication, Coricidin, to manage her symptoms and has not felt the need to use her Albuterol inhaler or Flonase nasal spray in the past week. The patient expresses concern about the duration of her recovery and the stagnation of her improvement over the past week.  The patient reports experiencing a "scary low" blood pressure, which led to discontinuation of her blood pressure medication. However, due to a recent spike in blood pressure, the patient has resumed her prescribed Losartan/hydrochlorothiazide 50/12.5mg . Despite taking the medication, the patient's blood pressure remains high, which she attributes to her current illness and medication intake.          Past Medical History:  Diagnosis Date   Allergy    Anemia    Anxiety    Arthritis    Asthma    borderline bronchitis   Bipolar I disorder (HCC) 07/26/2020   Bronchitis    Deep venous thrombosis (HCC) 12/26/2021   GERD (gastroesophageal reflux disease)    Goiter, toxic diffuse 05/30/2016   Hypertension    S/P tonsillectomy 05/30/2016   Sleep apnea    history of, MD tookpt. off machine 7 yrs, ago    Past Surgical History:  Procedure Laterality Date    ABDOMINAL HYSTERECTOMY     adnoides     BREAST SURGERY     CHOLECYSTECTOMY     COLONOSCOPY     DILATION AND CURETTAGE OF UTERUS     GASTRIC BYPASS  1998   IUD REMOVAL     TONSILLECTOMY     TOTAL KNEE ARTHROPLASTY Left 09/22/2018   TOTAL KNEE ARTHROPLASTY Left 09/22/2018   Procedure: LEFT TOTAL KNEE ARTHROPLASTY;  Surgeon: Cammy Copa, MD;  Location: [facility] OR;  Service: Orthopedics;  Laterality: Left;   TOTAL KNEE ARTHROPLASTY Right 11/29/2021   Procedure: RIGHT TOTAL KNEE ARTHROPLASTY;  Surgeon: Cammy Copa, MD;  Location: [facility] OR;  Service: Orthopedics;  Laterality: Right;    Family History  Problem Relation Age of Onset   Hypertension Father    Diabetes Father    Lupus Daughter    Colon cancer Maternal Aunt    Colon cancer Maternal Uncle        4 mat uncles dx colon ca   Esophageal cancer Neg Hx    Rectal cancer Neg Hx    Stomach cancer Neg Hx     Social History   Socioeconomic History   Marital status: Single    Spouse name: Not on file   Number of children: Not on file   Years of education: Not on file   Highest education level: Not on file  Occupational History   Not on file  Tobacco Use   Smoking status: Former    Current packs/day: 0.00  Average packs/day: 1.5 packs/day for 18.0 years (27.0 ttl pk-yrs)    Types: Cigarettes    Start date: 09/17/1966    Quit date: 09/16/1984    Years since quitting: 38.5   Smokeless tobacco: Never   Tobacco comments:    quit 1986  Vaping Use   Vaping status: Never Used  Substance and Sexual Activity   Alcohol use: Yes    Comment: social   Drug use: No   Sexual activity: Not on file  Other Topics Concern   Not on file  Social History Narrative   Not on file   Social Drivers of Health   Financial Resource Strain: Low Risk  (04/08/2023)   Overall Financial Resource Strain (CARDIA)    Difficulty of Paying Living Expenses: Not hard at all  Food Insecurity: No Food Insecurity (04/08/2023)   Hunger Vital Sign     Worried About Running Out of Food in the Last Year: Never true    Ran Out of Food in the Last Year: Never true  Transportation Needs: No Transportation Needs (04/08/2023)   PRAPARE - Administrator, Civil Service (Medical): No    Lack of Transportation (Non-Medical): No  Physical Activity: Inactive (04/08/2023)   Exercise Vital Sign    Days of Exercise per Week: 0 days    Minutes of Exercise per Session: 0 min  Stress: No Stress Concern Present (04/08/2023)   [employer] of Occupational Health - Occupational Stress Questionnaire    Feeling of Stress : Not at all  Social Connections: Moderately Integrated (04/08/2023)   Social Connection and Isolation Panel [NHANES]    Frequency of Communication with Friends and Family: Three times a week    Frequency of Social Gatherings with Friends and Family: Twice a week    Attends Religious Services: 1 to 4 times per year    Active Member of [employer] or Organizations: Yes    Attends Banker Meetings: 1 to 4 times per year    Marital Status: Divorced    Allergies  Allergen Reactions   Morphine And Codeine Diarrhea, Nausea And Vomiting and Swelling   Penicillins Swelling and Other (See Comments)    Comatose for 2 days    Pine Shortness Of Breath   Shellfish Allergy Shortness Of Breath and Swelling   Lactose Intolerance (Gi) Other (See Comments)    GI upset   Miralax [Polyethylene Glycol] Itching   Nickel Other (See Comments)    Pt states she gets abscess from the earrings with nickel    Other Itching and Nausea And Vomiting    Mushrooms - feels high, and dizzy    Grapeseed Extract [Nutritional Supplements] Rash    grapes   Latex Itching and Rash    "IF" condom, it makes it painful   Red Dye #40 (Allura Red) Swelling and Rash    The dye for checking thyroid.  Red Cast Dye   Sulfites Hives and Rash    Tingling in mouth   Wheat Rash and Other (See Comments)     tingling in mouth, vomiting    Outpatient  Medications Prior to Visit  Medication Sig Dispense Refill   acetaminophen (TYLENOL) 325 MG tablet Take 1 tablet (325 mg total) by mouth every 8 (eight) hours. (Patient taking differently: Take 650 mg by mouth 3 (three) times daily as needed (pain).) 100 tablet 2   albuterol (VENTOLIN HFA) 108 (90 Base) MCG/ACT inhaler Inhale 2 puffs into the lungs every 6 (six) hours  as needed for wheezing or shortness of breath. 34 g 1   Calcium Carbonate Antacid (TUMS PO) Take 2 tablets by mouth 3 (three) times daily as needed (reflux).     CALCIUM PO Take 1 tablet by mouth in the morning and at bedtime.     cetirizine (ZYRTEC) 10 MG tablet Take 10 mg by mouth daily.     Cholecalciferol (VITAMIN D3) 50 MCG (2000 UT) CAPS Take 2,000 Units by mouth daily.     fluticasone (FLONASE) 50 MCG/ACT nasal spray Place 1 spray into both nostrils 3 (three) times daily as needed for allergies or rhinitis. 16 g 0   hydrOXYzine (ATARAX) 10 MG tablet TAKE 1 TABLET BY MOUTH  DAILY AS NEEDED FOR ITCHING OR ANXIETY 90 tablet 1   losartan-hydrochlorothiazide (HYZAAR) 50-12.5 MG tablet Take 1 tablet by mouth daily. 90 tablet 3   Multiple Vitamin (MULTIVITAMIN) tablet Take 1 tablet by mouth daily.     pantoprazole (PROTONIX) 40 MG tablet Take 1 tablet (40 mg total) by mouth daily. 100 tablet 2   potassium chloride (KLOR-CON M) 10 MEQ tablet Take 1 tablet (10 mEq total) by mouth 2 (two) times daily. 90 tablet 1   tirzepatide (MOUNJARO) 5 MG/0.5ML Pen Inject 5 mg into the skin once a week.     doxycycline (VIBRAMYCIN) 100 MG capsule Take 1 capsule (100 mg total) by mouth 2 (two) times daily. (Patient not taking: Reported on 04/08/2023) 20 capsule 0   No facility-administered medications prior to visit.     ROS Review of Systems  Constitutional:  Negative for activity change and appetite change.  HENT:  Positive for congestion and postnasal drip. Negative for sinus pressure and sore throat.   Respiratory:  Negative for chest  tightness, shortness of breath and wheezing.   Cardiovascular:  Negative for chest pain and palpitations.  Gastrointestinal:  Negative for abdominal distention, abdominal pain and constipation.  Genitourinary: Negative.   Musculoskeletal: Negative.   Psychiatric/Behavioral:  Negative for behavioral problems and dysphoric mood.     Objective:  BP 138/80   Pulse (!) 57   Ht 5\' 3"  (1.6 m)   Wt 176 lb 12.8 oz (80.2 kg)   SpO2 100%   BMI 31.32 kg/m      04/08/2023    3:15 PM 04/08/2023    2:34 PM 03/18/2023   10:36 AM  BP/Weight  Systolic BP 138 156 170  Diastolic BP 80 87 101  Wt. (Lbs)  176.8   BMI  31.32 kg/m2       Physical Exam Constitutional:      Appearance: She is well-developed.  HENT:     Right Ear: Tympanic membrane normal.     Left Ear: Tympanic membrane normal.     Mouth/Throat:     Mouth: Mucous membranes are moist.  Cardiovascular:     Rate and Rhythm: Normal rate.     Heart sounds: Normal heart sounds. No murmur heard. Pulmonary:     Effort: Pulmonary effort is normal.     Breath sounds: Normal breath sounds. No wheezing or rales.  Chest:     Chest wall: No tenderness.  Abdominal:     General: Bowel sounds are normal. There is no distension.     Palpations: Abdomen is soft. There is no mass.     Tenderness: There is no abdominal tenderness.  Musculoskeletal:        General: Normal range of motion.     Right lower leg: No edema.  Left lower leg: No edema.  Neurological:     Mental Status: She is alert and oriented to person, place, and time.  Psychiatric:        Mood and Affect: Mood normal.        Latest Ref Rng & Units 02/11/2023    3:45 PM 09/03/2022   12:09 PM 11/20/2021   12:00 PM  CMP  Glucose 70 - 99 mg/dL 85  94  98   BUN 8 - 27 mg/dL 14  13  9    Creatinine 0.57 - 1.00 mg/dL 1.61  0.96  0.45   Sodium 134 - 144 mmol/L 140  139  135   Potassium 3.5 - 5.2 mmol/L 3.3  3.6  3.5   Chloride 96 - 106 mmol/L 101  103  104   CO2 20 - 29  mmol/L 22  23  26    Calcium 8.7 - 10.3 mg/dL 9.2  8.8  8.4   Total Protein 6.0 - 8.5 g/dL 7.5     Total Bilirubin 0.0 - 1.2 mg/dL 0.5     Alkaline Phos 44 - 121 IU/L 88     AST 0 - 40 IU/L 35     ALT 0 - 32 IU/L 25       Lipid Panel     Component Value Date/Time   CHOL 187 10/02/2021 1139   TRIG 90 10/02/2021 1139   HDL 61 10/02/2021 1139   CHOLHDL 3.1 10/02/2021 1139   LDLCALC 110 (H) 10/02/2021 1139    CBC    Component Value Date/Time   WBC 4.1 11/20/2021 1200   RBC 4.56 11/20/2021 1200   HGB 11.1 (L) 11/20/2021 1200   HGB 11.6 10/02/2021 1139   HCT 35.2 (L) 11/20/2021 1200   HCT 36.6 10/02/2021 1139   PLT 260 11/20/2021 1200   PLT 228 10/02/2021 1139   MCV 77.2 (L) 11/20/2021 1200   MCV 78 (L) 10/02/2021 1139   MCH 24.3 (L) 11/20/2021 1200   MCHC 31.5 11/20/2021 1200   RDW 15.7 (H) 11/20/2021 1200   RDW 15.1 10/02/2021 1139   LYMPHSABS 1.5 10/02/2021 1139   EOSABS 0.1 10/02/2021 1139   BASOSABS 0.0 10/02/2021 1139    Lab Results  Component Value Date   HGBA1C 5.4 (A) 04/08/2023    Assessment & Plan:       Chronic sinusitis Persistent sinus congestion and postnasal drainage. Improvement in cough and wheezing. No fever or body aches. -Continue over-the-counter Coricidin for symptom control. -Prescribe Prednisone 20mg  daily for 5 days to help with inflammation and drainage.  Hypertension Blood pressure elevated at 156/87, but lower than previous reading of 170/101. Patient has resumed Losartan 50mg /Hydrochlorothiazide 12.5mg  daily after a period of discontinuation due to hypotensive episodes. Repeat blood pressure improved to 130/80 -Continue Losartan 50mg /Hydrochlorothiazide 12.5mg  daily. -Monitor blood pressure at home and adjust medication as needed. -Schedule follow-up appointment in 1 month to reassess blood pressure when patient has fully recovered from upper respiratory infection to determine if she needs to stay chronically on losartan/HCTZ or she  can return to lifestyle modification which she was previously controlled on.      Hypokalemia -Last potassium was 3.3 -Will check potassium again today  PreDiabetes -A1c of 5.4 which is normal.  She has been able to reverse her prediabetes lifestyle modification  Meds ordered this encounter  Medications   predniSONE (DELTASONE) 20 MG tablet    Sig: Take 1 tablet (20 mg total) by mouth daily with breakfast.  Dispense:  5 tablet    Refill:  0    Follow-up: Return in about 1 month (around 05/06/2023) for Blood Pressure follow-up with PCP.       Hoy Register, MD, FAAFP. [facility] [place], [place] [phone]   04/08/2023, 4:25 PM

## 2023-04-08 NOTE — Patient Instructions (Addendum)
VISIT SUMMARY:  Cheryl Prince, you visited [place] today due to ongoing symptoms of a sinus infection and bronchitis, as well as concerns about your blood pressure. You have resumed taking your blood pressure medication, Losartan, but your blood pressure remains elevated. Your sinus infection and bronchitis symptoms have shown some improvement, but you are still experiencing congestion and mucus drainage.  YOUR PLAN:  -SINUSITIS: An upper respiratory infection affects the nose, throat, and airways. You have persistent sinus congestion and postnasal drainage, but your cough and wheezing have improved. Continue taking Coricidin for symptom control, and we have prescribed Prednisone 20mg  daily for 5 days to help with inflammation and drainage.  -HYPERTENSION: Hypertension means high blood pressure. Your blood pressure was currently elevated at 156/87 then improved on repeat down to 138/80. Continue taking Losartan 50mg /Hydrochlorothiazide 12.5mg  daily. Please monitor your blood pressure at home and we will adjust your medication as needed. We will reassess your blood pressure in a follow-up appointment in 1 month when you have fully recovered from your upper respiratory infection.  INSTRUCTIONS:  Please schedule a follow-up appointment in 1 month to reassess your blood pressure. Continue to monitor your blood pressure at home and report any significant changes.

## 2023-04-09 ENCOUNTER — Encounter: Payer: Self-pay | Admitting: Family Medicine

## 2023-04-09 LAB — POTASSIUM: Potassium: 3.8 mmol/L (ref 3.5–5.2)

## 2023-05-06 ENCOUNTER — Ambulatory Visit: Payer: Medicare Other | Admitting: Family Medicine

## 2023-05-27 ENCOUNTER — Encounter: Payer: Self-pay | Admitting: Family Medicine

## 2023-05-27 ENCOUNTER — Ambulatory Visit: Payer: Medicare Other | Attending: Family Medicine | Admitting: Family Medicine

## 2023-05-27 VITALS — BP 131/85 | HR 41 | Temp 98.2°F | Ht 63.0 in | Wt 179.0 lb

## 2023-05-27 DIAGNOSIS — I10 Essential (primary) hypertension: Secondary | ICD-10-CM | POA: Diagnosis not present

## 2023-05-27 DIAGNOSIS — K219 Gastro-esophageal reflux disease without esophagitis: Secondary | ICD-10-CM

## 2023-05-27 DIAGNOSIS — Z91018 Allergy to other foods: Secondary | ICD-10-CM

## 2023-05-27 DIAGNOSIS — E876 Hypokalemia: Secondary | ICD-10-CM | POA: Diagnosis not present

## 2023-05-27 MED ORDER — POTASSIUM CHLORIDE CRYS ER 10 MEQ PO TBCR: 10.0000 meq | EXTENDED_RELEASE_TABLET | Freq: Every day | ORAL | 1 refills | Status: DC

## 2023-05-27 NOTE — Progress Notes (Signed)
 Subjective:  Patient ID: Cheryl Prince, female    DOB: 20-Apr-1955  Age: 68 y.o. MRN: [id number]  CC: Hypertension (HTN f/u. Med refill. /Discuss recent A1C results/)     Discussed the use of AI scribe software for clinical note transcription with the patient, who gave verbal consent to proceed.  History of Present Illness The patient, with a history of hypertension, history of right knee replacement, chronic shoulder pain presents for a routine follow-up. She expresses concern about her recent A1c results, which she interpreted as abnormal. However, she acknowledges her daily candy consumption as a potential contributor to her sugar levels. She also mentions a significant weight loss from 200 pounds, which has positively impacted her A1c levels.  Her A1c when last checked was 5.4 and this is down from 5.7 previously.  The patient also discusses her medication regimen, which includes potassium, calcium, losartan hydrochlorothiazide, and pantoprazole. She reports taking one potassium pill daily, contrary to the twice-daily dosage listed in her chart. She also mentions taking hydroxyzine for her allergies, which are triggered by various foods, including seafood and mushrooms.  The patient is also due for a cholesterol test, which she plans to schedule for a fasting appointment. She mentions her active lifestyle, which includes daily exercises, a part-time job that involves constant walking, bowling, and swimming. She also mentions having knee replacements, which have improved her mobility and allowed her to maintain her active lifestyle.      Past Medical History:  Diagnosis Date   Allergy    Anemia    Anxiety    Arthritis    Asthma    borderline bronchitis   Bipolar I disorder (HCC) 07/26/2020   Bronchitis    Deep venous thrombosis (HCC) 12/26/2021   GERD (gastroesophageal reflux disease)    Goiter, toxic diffuse 05/30/2016   Hypertension    S/P tonsillectomy 05/30/2016    Sleep apnea    history of, MD tookpt. off machine 7 yrs, ago    Past Surgical History:  Procedure Laterality Date   ABDOMINAL HYSTERECTOMY     adnoides     BREAST SURGERY     CHOLECYSTECTOMY     COLONOSCOPY     DILATION AND CURETTAGE OF UTERUS     GASTRIC BYPASS  1998   IUD REMOVAL     TONSILLECTOMY     TOTAL KNEE ARTHROPLASTY Left 09/22/2018   TOTAL KNEE ARTHROPLASTY Left 09/22/2018   Procedure: LEFT TOTAL KNEE ARTHROPLASTY;  Surgeon: Cammy Copa, MD;  Location: [facility] OR;  Service: Orthopedics;  Laterality: Left;   TOTAL KNEE ARTHROPLASTY Right 11/29/2021   Procedure: RIGHT TOTAL KNEE ARTHROPLASTY;  Surgeon: Cammy Copa, MD;  Location: [facility] OR;  Service: Orthopedics;  Laterality: Right;    Family History  Problem Relation Age of Onset   Hypertension Father    Diabetes Father    Lupus Daughter    Colon cancer Maternal Aunt    Colon cancer Maternal Uncle        4 mat uncles dx colon ca   Esophageal cancer Neg Hx    Rectal cancer Neg Hx    Stomach cancer Neg Hx     Social History   Socioeconomic History   Marital status: Single    Spouse name: Not on file   Number of children: Not on file   Years of education: Not on file   Highest education level: Not on file  Occupational History   Not on file  Tobacco Use  Smoking status: Former    Current packs/day: 0.00    Average packs/day: 1.5 packs/day for 18.0 years (27.0 ttl pk-yrs)    Types: Cigarettes    Start date: 09/17/1966    Quit date: 09/16/1984    Years since quitting: 38.7   Smokeless tobacco: Never   Tobacco comments:    quit 1986  Vaping Use   Vaping status: Never Used  Substance and Sexual Activity   Alcohol use: Yes    Comment: social   Drug use: No   Sexual activity: Not on file  Other Topics Concern   Not on file  Social History Narrative   Not on file   Social Drivers of Health   Financial Resource Strain: Low Risk  (04/08/2023)   Overall Financial Resource Strain (CARDIA)     Difficulty of Paying Living Expenses: Not hard at all  Food Insecurity: No Food Insecurity (04/08/2023)   Hunger Vital Sign    Worried About Running Out of Food in the Last Year: Never true    Ran Out of Food in the Last Year: Never true  Transportation Needs: No Transportation Needs (04/08/2023)   PRAPARE - Administrator, Civil Service (Medical): No    Lack of Transportation (Non-Medical): No  Physical Activity: Inactive (04/08/2023)   Exercise Vital Sign    Days of Exercise per Week: 0 days    Minutes of Exercise per Session: 0 min  Stress: No Stress Concern Present (04/08/2023)   [employer] of Occupational Health - Occupational Stress Questionnaire    Feeling of Stress : Not at all  Social Connections: Moderately Integrated (04/08/2023)   Social Connection and Isolation Panel [NHANES]    Frequency of Communication with Friends and Family: Three times a week    Frequency of Social Gatherings with Friends and Family: Twice a week    Attends Religious Services: 1 to 4 times per year    Active Member of [employer] or Organizations: Yes    Attends Banker Meetings: 1 to 4 times per year    Marital Status: Divorced    Allergies  Allergen Reactions   Morphine And Codeine Diarrhea, Nausea And Vomiting and Swelling   Penicillins Swelling and Other (See Comments)    Comatose for 2 days    Pine Shortness Of Breath   Shellfish Allergy Shortness Of Breath and Swelling   Lactose Intolerance (Gi) Other (See Comments)    GI upset   Miralax [Polyethylene Glycol] Itching   Nickel Other (See Comments)    Pt states she gets abscess from the earrings with nickel    Other Itching and Nausea And Vomiting    Mushrooms - feels high, and dizzy    Grapeseed Extract [Nutritional Supplements] Rash    grapes   Latex Itching and Rash    "IF" condom, it makes it painful   Red Dye #40 (Allura Red) Swelling and Rash    The dye for checking thyroid.  Red Cast Dye   Sulfites Hives  and Rash    Tingling in mouth   Wheat Rash and Other (See Comments)     tingling in mouth, vomiting    Outpatient Medications Prior to Visit  Medication Sig Dispense Refill   acetaminophen (TYLENOL) 325 MG tablet Take 1 tablet (325 mg total) by mouth every 8 (eight) hours. (Patient taking differently: Take 650 mg by mouth 3 (three) times daily as needed (pain).) 100 tablet 2   albuterol (VENTOLIN HFA) 108 (90 Base)  MCG/ACT inhaler Inhale 2 puffs into the lungs every 6 (six) hours as needed for wheezing or shortness of breath. 34 g 1   Calcium Carbonate Antacid (TUMS PO) Take 2 tablets by mouth 3 (three) times daily as needed (reflux).     CALCIUM PO Take 1 tablet by mouth in the morning and at bedtime.     cetirizine (ZYRTEC) 10 MG tablet Take 10 mg by mouth daily.     Cholecalciferol (VITAMIN D3) 50 MCG (2000 UT) CAPS Take 2,000 Units by mouth daily.     doxycycline (VIBRAMYCIN) 100 MG capsule Take 1 capsule (100 mg total) by mouth 2 (two) times daily. 20 capsule 0   fluticasone (FLONASE) 50 MCG/ACT nasal spray Place 1 spray into both nostrils 3 (three) times daily as needed for allergies or rhinitis. 16 g 0   hydrOXYzine (ATARAX) 10 MG tablet TAKE 1 TABLET BY MOUTH  DAILY AS NEEDED FOR ITCHING OR ANXIETY 90 tablet 1   losartan-hydrochlorothiazide (HYZAAR) 50-12.5 MG tablet Take 1 tablet by mouth daily. 90 tablet 3   Multiple Vitamin (MULTIVITAMIN) tablet Take 1 tablet by mouth daily.     pantoprazole (PROTONIX) 40 MG tablet Take 1 tablet (40 mg total) by mouth daily. 100 tablet 2   potassium chloride (KLOR-CON M) 10 MEQ tablet Take 1 tablet (10 mEq total) by mouth 2 (two) times daily. 90 tablet 1   predniSONE (DELTASONE) 20 MG tablet Take 1 tablet (20 mg total) by mouth daily with breakfast. (Patient not taking: Reported on 05/27/2023) 5 tablet 0   tirzepatide (MOUNJARO) 5 MG/0.5ML Pen Inject 5 mg into the skin once a week. (Patient not taking: Reported on 05/27/2023)     No  facility-administered medications prior to visit.     ROS Review of Systems  Constitutional:  Negative for activity change and appetite change.  HENT:  Negative for sinus pressure and sore throat.   Respiratory:  Negative for chest tightness, shortness of breath and wheezing.   Cardiovascular:  Negative for chest pain and palpitations.  Gastrointestinal:  Negative for abdominal distention, abdominal pain and constipation.  Genitourinary: Negative.   Musculoskeletal: Negative.   Psychiatric/Behavioral:  Negative for behavioral problems and dysphoric mood.     Objective:  BP 131/85 (BP Location: Left Arm, Patient Position: Sitting, Cuff Size: Normal)   Pulse (!) 41   Temp 98.2 F (36.8 C) (Oral)   Ht 5\' 3"  (1.6 m)   Wt 179 lb (81.2 kg)   SpO2 100%   BMI 31.71 kg/m      05/27/2023    2:03 PM 04/08/2023    3:15 PM 04/08/2023    2:34 PM  BP/Weight  Systolic BP 131 138 156  Diastolic BP 85 80 87  Wt. (Lbs) 179  176.8  BMI 31.71 kg/m2  31.32 kg/m2      Physical Exam Constitutional:      Appearance: She is well-developed.  Cardiovascular:     Rate and Rhythm: Normal rate.     Heart sounds: Normal heart sounds. No murmur heard. Pulmonary:     Effort: Pulmonary effort is normal.     Breath sounds: Normal breath sounds. No wheezing or rales.  Chest:     Chest wall: No tenderness.  Abdominal:     General: Bowel sounds are normal. There is no distension.     Palpations: Abdomen is soft. There is no mass.     Tenderness: There is no abdominal tenderness.  Musculoskeletal:  General: Normal range of motion.     Right lower leg: No edema.     Left lower leg: No edema.  Neurological:     Mental Status: She is alert and oriented to person, place, and time.  Psychiatric:        Mood and Affect: Mood normal.        Latest Ref Rng & Units 04/08/2023    3:36 PM 02/11/2023    3:45 PM 09/03/2022   12:09 PM  CMP  Glucose 70 - 99 mg/dL  85  94   BUN 8 - 27 mg/dL  14  13    Creatinine 1.61 - 1.00 mg/dL  0.96  0.45   Sodium 409 - 144 mmol/L  140  139   Potassium 3.5 - 5.2 mmol/L 3.8  3.3  3.6   Chloride 96 - 106 mmol/L  101  103   CO2 20 - 29 mmol/L  22  23   Calcium 8.7 - 10.3 mg/dL  9.2  8.8   Total Protein 6.0 - 8.5 g/dL  7.5    Total Bilirubin 0.0 - 1.2 mg/dL  0.5    Alkaline Phos 44 - 121 IU/L  88    AST 0 - 40 IU/L  35    ALT 0 - 32 IU/L  25      Lipid Panel     Component Value Date/Time   CHOL 187 10/02/2021 1139   TRIG 90 10/02/2021 1139   HDL 61 10/02/2021 1139   CHOLHDL 3.1 10/02/2021 1139   LDLCALC 110 (H) 10/02/2021 1139    CBC    Component Value Date/Time   WBC 4.1 11/20/2021 1200   RBC 4.56 11/20/2021 1200   HGB 11.1 (L) 11/20/2021 1200   HGB 11.6 10/02/2021 1139   HCT 35.2 (L) 11/20/2021 1200   HCT 36.6 10/02/2021 1139   PLT 260 11/20/2021 1200   PLT 228 10/02/2021 1139   MCV 77.2 (L) 11/20/2021 1200   MCV 78 (L) 10/02/2021 1139   MCH 24.3 (L) 11/20/2021 1200   MCHC 31.5 11/20/2021 1200   RDW 15.7 (H) 11/20/2021 1200   RDW 15.1 10/02/2021 1139   LYMPHSABS 1.5 10/02/2021 1139   EOSABS 0.1 10/02/2021 1139   BASOSABS 0.0 10/02/2021 1139    Lab Results  Component Value Date   HGBA1C 5.4 (A) 04/08/2023       Assessment & Plan Prediabetes A1c improved to 5.4 from 5.7 in 2023, attributed to weight management. - Continue current lifestyle modifications including diet and exercise.  Hypertension Blood pressure is well-managed on losartan-hydrochlorothiazide. - Continue losartan-hydrochlorothiazide as prescribed. -Counseled on blood pressure goal of less than 130/80, low-sodium, DASH diet, medication compliance, 150 minutes of moderate intensity exercise per week. Discussed medication compliance, adverse effects.   Hypokalemia Potassium levels maintained at 3.8 with potassium chloride 10 MEQ daily and dietary adjustments. - Continue potassium chloride 10 MEQ once daily. - Encourage consumption of potassium-rich  foods.  GERD (Gastroesophageal Reflux Disease) GERD symptoms are managed with daily pantoprazole. - Continue pantoprazole 40 MG daily.  Allergy Multiple food allergies including mushrooms, shrimp, seafood, and sulfites. Hydroxyzine advised for management; EpiPen not prescribed due to sulfite allergy. - Continue hydroxyzine as needed for allergy management. - Advise carrying Benadryl or hydroxyzine for allergic reactions.  General Health Maintenance Due for cholesterol test; last done in 2023. Maintains active lifestyle with physical activities and part-time job. - Schedule fasting lipid panel on June 03, 2023. - Encourage continued physical activity  and healthy lifestyle.  Follow-up Plan to review lab results and manage medications. - Follow up in 6 months for routine check-up. - Review lab results once available.      Meds ordered this encounter  Medications   potassium chloride (KLOR-CON M) 10 MEQ tablet    Sig: Take 1 tablet (10 mEq total) by mouth daily.    Dispense:  90 tablet    Refill:  1    Follow-up: Return in about 6 months (around 11/26/2023) for Chronic medical conditions.       Hoy Register, MD, FAAFP. [facility] [place], [place] [phone]   05/27/2023, 5:22 PM

## 2023-05-27 NOTE — Patient Instructions (Addendum)
 VISIT SUMMARY:  You came in today for a routine follow-up. We discussed your recent A1c results, your medication regimen, and your active lifestyle. You also shared your concerns about allergies and your plans to schedule a cholesterol test. Overall, your health is well-managed, and we have a plan to continue your current treatments and lifestyle modifications.  YOUR PLAN:  -PREDIABETES: Your A1c has normalized to 5.4 from 5.7, thanks to your weight management. Continue with your current diet and exercise routine.  -HYPERTENSION: Hypertension is high blood pressure. Your blood pressure is well-managed with your current medication, losartan-hydrochlorothiazide. Continue taking this medication as prescribed.  -HYPOKALEMIA: Hypokalemia means you have low potassium levels. Your potassium levels are stable at 3.8 with your current potassium chloride 10 MEQ daily. Continue taking this medication once daily and try to eat more potassium-rich foods.  -GERD (GASTROESOPHAGEAL REFLUX DISEASE): GERD is a condition where stomach acid frequently flows back into the tube connecting your mouth and stomach. Your symptoms are managed with pantoprazole. Continue taking pantoprazole 40 MG daily.  -ALLERGY: You have multiple food allergies, including mushrooms, shrimp, seafood, and sulfites. You are managing these with hydroxyzine. Continue taking hydroxyzine as needed and carry Benadryl or hydroxyzine for allergic reactions.  -GENERAL HEALTH MAINTENANCE: You are due for a cholesterol test and maintain an active lifestyle with various physical activities. Schedule a fasting lipid panel on June 03, 2023, and continue your physical activities and healthy lifestyle.  INSTRUCTIONS:  Please schedule your fasting lipid panel on June 03, 2023. Follow up in 6 months for a routine check-up and to review your lab results once they are available.

## 2023-06-03 ENCOUNTER — Ambulatory Visit: Attending: Critical Care Medicine

## 2023-06-03 DIAGNOSIS — I10 Essential (primary) hypertension: Secondary | ICD-10-CM

## 2023-06-04 ENCOUNTER — Encounter: Payer: Self-pay | Admitting: Family Medicine

## 2023-06-04 LAB — CMP14+EGFR
ALT: 22 IU/L (ref 0–32)
AST: 32 IU/L (ref 0–40)
Albumin: 4.1 g/dL (ref 3.9–4.9)
Alkaline Phosphatase: 85 IU/L (ref 44–121)
BUN/Creatinine Ratio: 14 (ref 12–28)
BUN: 14 mg/dL (ref 8–27)
Bilirubin Total: 0.6 mg/dL (ref 0.0–1.2)
CO2: 24 mmol/L (ref 20–29)
Calcium: 8.8 mg/dL (ref 8.7–10.3)
Chloride: 102 mmol/L (ref 96–106)
Creatinine, Ser: 0.97 mg/dL (ref 0.57–1.00)
Globulin, Total: 3.1 g/dL (ref 1.5–4.5)
Glucose: 89 mg/dL (ref 70–99)
Potassium: 3.7 mmol/L (ref 3.5–5.2)
Sodium: 140 mmol/L (ref 134–144)
Total Protein: 7.2 g/dL (ref 6.0–8.5)
eGFR: 63 mL/min/{1.73_m2} (ref >59)

## 2023-06-04 LAB — LP+NON-HDL CHOLESTEROL
Cholesterol, Total: 169 mg/dL (ref 100–199)
HDL: 72 mg/dL (ref >39)
LDL Chol Calc (NIH): 81 mg/dL (ref 0–99)
Total Non-HDL-Chol (LDL+VLDL): 97 mg/dL (ref 0–129)
Triglycerides: 86 mg/dL (ref 0–149)
VLDL Cholesterol Cal: 16 mg/dL (ref 5–40)

## 2023-06-23 ENCOUNTER — Other Ambulatory Visit: Payer: Self-pay | Admitting: Critical Care Medicine

## 2023-07-06 ENCOUNTER — Other Ambulatory Visit: Payer: Self-pay | Admitting: Family Medicine

## 2023-07-08 NOTE — Telephone Encounter (Signed)
 Reordered 05/27/23 #90 1 RF  Requested Prescriptions  Refused Prescriptions Disp Refills   potassium chloride  (KLOR-CON  M) 10 MEQ tablet [Pharmacy Med Name: Potassium Chloride  Crys ER 10 MEQ Oral Tablet Extended Release] 100 tablet 2    Sig: TAKE 1 TABLET BY MOUTH DAILY     Endocrinology:  Minerals - Potassium Supplementation Passed - 07/08/2023  2:28 PM      Passed - K in normal range and within 360 days    Potassium  Date Value Ref Range Status  06/03/2023 3.7 3.5 - 5.2 mmol/L Final         Passed - Cr in normal range and within 360 days    Creatinine, Ser  Date Value Ref Range Status  06/03/2023 0.97 0.57 - 1.00 mg/dL Final         Passed - Valid encounter within last 12 months    Recent Outpatient Visits           1 month ago Primary hypertension   [facility] Dept Of [place]. [facility] Joaquin Mulberry, MD   3 months ago Prediabetes   [facility] [facility] [place] - A Dept Of [place]. [facility] Joaquin Mulberry, MD   4 months ago Chronic left shoulder pain   [place] [facility] [place] - A Dept Of [place]. [facility] Joaquin Mulberry, MD   8 months ago Primary hypertension   [facility] [facility] [place] - A Dept Of [place]. [facility] Vernell Goldsmith, MD   10 months ago Primary hypertension   [place] [facility] [place] - A Dept Of . [facility] Vernell Goldsmith, MD

## 2023-07-28 ENCOUNTER — Telehealth: Payer: Self-pay | Admitting: Family Medicine

## 2023-07-28 NOTE — Telephone Encounter (Signed)
 Copied from CRM [id number]. Topic: Clinical - Medical Advice >> Jul 28, 2023 12:09 PM Juleen Oakland F wrote:  Reason for CRM: [facility] needs verify patient diagnosis, they need verbal confirmation of conditions of diabetes, cardiovascular, etc . Please call [phone] Reference [phone]

## 2023-07-28 NOTE — Telephone Encounter (Signed)
 Call placed and rep was informed that patient has none of the following.

## 2023-07-30 ENCOUNTER — Other Ambulatory Visit: Payer: Self-pay | Admitting: Pharmacist

## 2023-07-30 DIAGNOSIS — Z760 Encounter for issue of repeat prescription: Secondary | ICD-10-CM

## 2023-07-30 MED ORDER — GABAPENTIN 300 MG PO CAPS: 300.0000 mg | ORAL_CAPSULE | Freq: Two times a day (BID) | ORAL | 3 refills | Status: AC

## 2023-07-30 NOTE — Telephone Encounter (Signed)
 Patient calling requesting refill for gabapentin . I reviewed her chart. It looks like this rxn fell off 03/2022 when she reported not taking it. However, I called and confirmed she is taking 300 mg BID. I cannot reorder as she does not have an active rxn on file. If possible, can you reorder the gabapentin  and send to [facility]?

## 2023-08-06 ENCOUNTER — Telehealth: Payer: Self-pay | Admitting: Family Medicine

## 2023-08-06 NOTE — Telephone Encounter (Signed)
 FYI Patient came to the office today to pick up assessment paperwork for insurance company, Verified name & DOB. Patient picked up paperwork.

## 2023-09-20 ENCOUNTER — Other Ambulatory Visit: Payer: Self-pay | Admitting: Family Medicine

## 2023-09-22 ENCOUNTER — Other Ambulatory Visit: Payer: Self-pay | Admitting: Pharmacist

## 2023-09-22 DIAGNOSIS — Z760 Encounter for issue of repeat prescription: Secondary | ICD-10-CM

## 2023-09-22 MED ORDER — HYDROXYZINE HCL 10 MG PO TABS: ORAL_TABLET | ORAL | 1 refills | Status: DC

## 2023-09-26 ENCOUNTER — Other Ambulatory Visit: Payer: Self-pay | Admitting: Critical Care Medicine

## 2023-09-29 NOTE — Telephone Encounter (Signed)
 Requested Prescriptions  Pending Prescriptions Disp Refills   pantoprazole  (PROTONIX ) 40 MG tablet [Pharmacy Med Name: Pantoprazole  Sodium 40 MG Oral Tablet Delayed Release] 100 tablet 1    Sig: TAKE 1 TABLET BY MOUTH DAILY     Gastroenterology: Proton Pump Inhibitors Passed - 09/29/2023  1:41 PM      Passed - Valid encounter within last 12 months    Recent Outpatient Visits           4 months ago Primary hypertension   [place] [facility] [place] [facility] A Dept Of [place]. [facility] Delbert Clam, MD   5 months ago Prediabetes   [place] [facility] [place] - A Dept Of [place]. [facility] Delbert Clam, MD   7 months ago Chronic left shoulder pain   [place] [facility] [place] - A Dept Of [place]. [facility] Delbert Clam, MD   10 months ago Primary hypertension   [place] [facility] [place] - A Dept Of [place]. [facility] Brien Belvie BRAVO, MD   1 year ago Primary hypertension   [place] [facility] [place] - A Dept Of [place]. [facility] Brien Belvie BRAVO, MD

## 2023-10-28 ENCOUNTER — Telehealth: Payer: Self-pay | Admitting: Family Medicine

## 2023-10-28 ENCOUNTER — Ambulatory Visit: Attending: Family Medicine

## 2023-10-28 NOTE — Telephone Encounter (Signed)
 Patient has been scheduled for a video visit with PCP to discuss concerns

## 2023-10-28 NOTE — Telephone Encounter (Signed)
 Copied from CRM [id number]. Topic: Clinical - Medication Question >> Oct 28, 2023  9:27 AM Cheryl Prince wrote: Reason for CRM: Patient is asking for a new sleeping medication. The sleeping medication she had before caused her to be groggy. But she needs a sleep aid. Call back # for verification [phone]

## 2023-10-29 ENCOUNTER — Telehealth: Payer: Self-pay | Admitting: Family Medicine

## 2023-10-29 NOTE — Telephone Encounter (Signed)
 Pt confirmed appt 9/2 (per vr)

## 2023-10-30 ENCOUNTER — Ambulatory Visit: Attending: Family Medicine | Admitting: Family Medicine

## 2023-10-30 ENCOUNTER — Encounter: Payer: Self-pay | Admitting: Family Medicine

## 2023-10-30 ENCOUNTER — Other Ambulatory Visit: Payer: Self-pay | Admitting: Family Medicine

## 2023-10-30 DIAGNOSIS — G4700 Insomnia, unspecified: Secondary | ICD-10-CM

## 2023-10-30 MED ORDER — HYDROXYZINE HCL 10 MG PO TABS: 10.0000 mg | ORAL_TABLET | Freq: Every evening | ORAL | 1 refills | Status: AC | PRN

## 2023-10-30 NOTE — Telephone Encounter (Signed)
 Rx 05/27/23#90 1RF- too soon Requested Prescriptions  Pending Prescriptions Disp Refills   potassium chloride  (KLOR-CON  M) 10 MEQ tablet [Pharmacy Med Name: Potassium Chloride  Crys ER 10 MEQ Oral Tablet Extended Release] 80 tablet 3    Sig: TAKE 1 TABLET BY MOUTH DAILY     Endocrinology:  Minerals - Potassium Supplementation Passed - 10/30/2023  4:23 PM      Passed - K in normal range and within 360 days    Potassium  Date Value Ref Range Status  06/03/2023 3.7 3.5 - 5.2 mmol/L Final         Passed - Cr in normal range and within 360 days    Creatinine, Ser  Date Value Ref Range Status  06/03/2023 0.97 0.57 - 1.00 mg/dL Final         Passed - Valid encounter within last 12 months    Recent Outpatient Visits           Today Insomnia, unspecified type   [place] [facility] Dept Of [place]. [facility] Delbert Clam, MD   5 months ago Primary hypertension   [place] [facility] [place] - A Dept Of [place]. [facility] Delbert Clam, MD   6 months ago Prediabetes   [place] [facility] [place] - A Dept Of [place]. [facility] Delbert Clam, MD   8 months ago Chronic left shoulder pain   [place] [facility] [place] - A Dept Of [place]. [facility] Delbert Clam, MD   11 months ago Primary hypertension   [place] [facility] [place] - A Dept Of [place]. [facility] Brien Belvie BRAVO, MD

## 2023-10-30 NOTE — Patient Instructions (Signed)
 Insomnia Insomnia is a sleep disorder that makes it difficult to fall asleep or stay asleep. Insomnia can cause fatigue, low energy, difficulty concentrating, mood swings, and poor performance at work or school. There are three different ways to classify insomnia: Difficulty falling asleep. Difficulty staying asleep. Waking up too early in the morning. Any type of insomnia can be long-term (chronic) or short-term (acute). Both are common. Short-term insomnia usually lasts for 3 months or less. Chronic insomnia occurs at least three times a week for longer than 3 months. What are the causes? Insomnia may be caused by another condition, situation, or substance, such as: Having certain mental health conditions, such as anxiety and depression. Using caffeine, alcohol , tobacco, or drugs. Having gastrointestinal conditions, such as gastroesophageal reflux disease (GERD). Having certain medical conditions. These include: Asthma. Alzheimer's disease. Stroke. Chronic pain. An overactive thyroid  gland (hyperthyroidism). Other sleep disorders, such as restless legs syndrome and sleep apnea. Menopause. Sometimes, the cause of insomnia may not be known. What increases the risk? Risk factors for insomnia include: Gender. Females are affected more often than males. Age. Insomnia is more common as people get older. Stress and certain medical and mental health conditions. Lack of exercise. Having an irregular work schedule. This may include working night shifts and traveling between different time zones. What are the signs or symptoms? If you have insomnia, the main symptom is having trouble falling asleep or having trouble staying asleep. This may lead to other symptoms, such as: Feeling tired or having low energy. Feeling nervous about going to sleep. Not feeling rested in the morning. Having trouble concentrating. Feeling irritable, anxious, or depressed. How is this diagnosed? This condition  may be diagnosed based on: Your symptoms and medical history. Your health care provider may ask about: Your sleep habits. Any medical conditions you have. Your mental health. A physical exam. How is this treated? Treatment for insomnia depends on the cause. Treatment may focus on treating an underlying condition that is causing the insomnia. Treatment may also include: Medicines to help you sleep. Counseling or therapy. Lifestyle adjustments to help you sleep better. Follow these instructions at home: Eating and drinking  Limit or avoid alcohol , caffeinated beverages, and products that contain nicotine and tobacco, especially close to bedtime. These can disrupt your sleep. Do not eat a large meal or eat spicy foods right before bedtime. This can lead to digestive discomfort that can make it hard for you to sleep. Sleep habits  Keep a sleep diary to help you and your health care provider figure out what could be causing your insomnia. Write down: When you sleep. When you wake up during the night. How well you sleep and how rested you feel the next day. Any side effects of medicines you are taking. What you eat and drink. Make your bedroom a dark, comfortable place where it is easy to fall asleep. Put up shades or blackout curtains to block light from outside. Use a white noise machine to block noise. Keep the temperature cool. Limit screen use before bedtime. This includes: Not watching TV. Not using your smartphone, tablet, or computer. Stick to a routine that includes going to bed and waking up at the same times every day and night. This can help you fall asleep faster. Consider making a quiet activity, such as reading, part of your nighttime routine. Try to avoid taking naps during the day so that you sleep better at night. Get out of bed if you are still awake after  15 minutes of trying to sleep. Keep the lights down, but try reading or doing a quiet activity. When you feel  sleepy, go back to bed. General instructions Take over-the-counter and prescription medicines only as told by your health care provider. Exercise regularly as told by your health care provider. However, avoid exercising in the hours right before bedtime. Use relaxation techniques to manage stress. Ask your health care provider to suggest some techniques that may work well for you. These may include: Breathing exercises. Routines to release muscle tension. Visualizing peaceful scenes. Make sure that you drive carefully. Do not drive if you feel very sleepy. Keep all follow-up visits. This is important. Contact a health care provider if: You are tired throughout the day. You have trouble in your daily routine due to sleepiness. You continue to have sleep problems, or your sleep problems get worse. Get help right away if: You have thoughts about hurting yourself or someone else. Get help right away if you feel like you may hurt yourself or others, or have thoughts about taking your own life. Go to your nearest emergency room or: Call 911. Call the National Suicide Prevention Lifeline at 2232757840 or 988. This is open 24 hours a day. Text the Crisis Text Line at 657-529-4371. Summary Insomnia is a sleep disorder that makes it difficult to fall asleep or stay asleep. Insomnia can be long-term (chronic) or short-term (acute). Treatment for insomnia depends on the cause. Treatment may focus on treating an underlying condition that is causing the insomnia. Keep a sleep diary to help you and your health care provider figure out what could be causing your insomnia. This information is not intended to replace advice given to you by your health care provider. Make sure you discuss any questions you have with your health care provider. Document Revised: 01/22/2021 Document Reviewed: 01/22/2021 Elsevier Patient Education  2024 [employer].

## 2023-10-30 NOTE — Progress Notes (Signed)
 Virtual Visit via Video Note  I connected with Cheryl Prince, on 10/30/2023 at 1:36 PM by video enabled telemedicine device and verified that I am speaking with the correct person using two identifiers.   Consent: I discussed the limitations, risks, security and privacy concerns of performing an evaluation and management service by telemedicine and the availability of in person appointments. I also discussed with the patient that there may be a patient responsible charge related to this service. The patient expressed understanding and agreed to proceed.   Location of Patient: Home  Location of Provider: Clinic   Persons participating in Telemedicine visit: Caitlyn Buchanan Dr. Delbert    Discussed the use of AI scribe software for clinical note transcription with the patient, who gave verbal consent to proceed.  History of Present Illness Cheryl Prince is a 68 year old female with a history of hypertension, history of right knee replacement, chronic shoulder pain  who presents with difficulty sleeping.  She experiences difficulty sleeping, typically going to bed around 11 PM and waking between 3 and 4 AM. Previously prescribed a sleep medication, she discontinued it due to excessive sedation and weakness. She currently uses Zyrtec  at night to aid sleep and has a prescription for hydroxyzine  for allergy-related itching. Gabapentin  is taken as needed for knee pain.  During a recent trip, she missed her blood pressure medication and took diuretics instead, leading to a sleepless night and difficulty breathing. This sometimes results in staying awake for two days. She does not usually nap during the day.  She is in the process of relocating, causing disorganization at home and difficulty locating medications, with only two hydroxyzine  tablets remaining.      Past Medical History:  Diagnosis Date   Allergy    Anemia    Anxiety    Arthritis    Asthma     borderline bronchitis   Bipolar I disorder (HCC) 07/26/2020   Bronchitis    Deep venous thrombosis (HCC) 12/26/2021   GERD (gastroesophageal reflux disease)    Goiter, toxic diffuse 05/30/2016   Hypertension    S/P tonsillectomy 05/30/2016   Sleep apnea    history of, MD tookpt. off machine 7 yrs, ago   Allergies  Allergen Reactions   Morphine  And Codeine Diarrhea, Nausea And Vomiting and Swelling   Penicillins Swelling and Other (See Comments)    Comatose for 2 days    Pine Shortness Of Breath   Shellfish Allergy Shortness Of Breath and Swelling   Lactose Intolerance (Gi) Other (See Comments)    GI upset   Miralax [Polyethylene Glycol] Itching   Nickel Other (See Comments)    Pt states she gets abscess from the earrings with nickel    Other Itching and Nausea And Vomiting    Mushrooms - feels high, and dizzy    Grapeseed Extract [Nutritional Supplements] Rash    grapes   Latex Itching and Rash    IF condom, it makes it painful   Red Dye #40 (Allura Red) Swelling and Rash    The dye for checking thyroid.  Red Cast Dye   Sulfites Hives and Rash    Tingling in mouth   Wheat Rash and Other (See Comments)     tingling in mouth, vomiting    Current Outpatient Medications on File Prior to Visit  Medication Sig Dispense Refill   acetaminophen  (TYLENOL ) 325 MG tablet Take 1 tablet (325 mg total) by mouth every 8 (eight) hours. (Patient taking  differently: Take 650 mg by mouth 3 (three) times daily as needed (pain).) 100 tablet 2   albuterol  (VENTOLIN  HFA) 108 (90 Base) MCG/ACT inhaler Inhale 2 puffs into the lungs every 6 (six) hours as needed for wheezing or shortness of breath. 34 g 1   Calcium Carbonate Antacid (TUMS PO) Take 2 tablets by mouth 3 (three) times daily as needed (reflux).     CALCIUM PO Take 1 tablet by mouth in the morning and at bedtime.     cetirizine  (ZYRTEC ) 10 MG tablet Take 10 mg by mouth daily.     Cholecalciferol  (VITAMIN D3) 50 MCG (2000 UT) CAPS Take  2,000 Units by mouth daily.     doxycycline  (VIBRAMYCIN ) 100 MG capsule Take 1 capsule (100 mg total) by mouth 2 (two) times daily. 20 capsule 0   fluticasone  (FLONASE ) 50 MCG/ACT nasal spray Place 1 spray into both nostrils 3 (three) times daily as needed for allergies or rhinitis. 16 g 0   gabapentin  (NEURONTIN ) 300 MG capsule Take 1 capsule (300 mg total) by mouth 2 (two) times daily. 180 capsule 3   losartan -hydrochlorothiazide  (HYZAAR) 50-12.5 MG tablet TAKE 1 TABLET BY MOUTH DAILY 100 tablet 2   Multiple Vitamin (MULTIVITAMIN) tablet Take 1 tablet by mouth daily.     pantoprazole  (PROTONIX ) 40 MG tablet TAKE 1 TABLET BY MOUTH DAILY 100 tablet 1   potassium chloride  (KLOR-CON  M) 10 MEQ tablet Take 1 tablet (10 mEq total) by mouth daily. 90 tablet 1   No current facility-administered medications on file prior to visit.    ROS: See HPI  Observations/Objective: Awake, alert, oriented x3 Not in acute distress Normal mood      Latest Ref Rng & Units 06/03/2023    8:37 AM 04/08/2023    3:36 PM 02/11/2023    3:45 PM  CMP  Glucose 70 - 99 mg/dL 89   85   BUN 8 - 27 mg/dL 14   14   Creatinine 9.42 - 1.00 mg/dL 9.02   9.12   Sodium 865 - 144 mmol/L 140   140   Potassium 3.5 - 5.2 mmol/L 3.7  3.8  3.3   Chloride 96 - 106 mmol/L 102   101   CO2 20 - 29 mmol/L 24   22   Calcium 8.7 - 10.3 mg/dL 8.8   9.2   Total Protein 6.0 - 8.5 g/dL 7.2   7.5   Total Bilirubin 0.0 - 1.2 mg/dL 0.6   0.5   Alkaline Phos 44 - 121 IU/L 85   88   AST 0 - 40 IU/L 32   35   ALT 0 - 32 IU/L 22   25     Lipid Panel     Component Value Date/Time   CHOL 169 06/03/2023 0837   TRIG 86 06/03/2023 0837   HDL 72 06/03/2023 0837   CHOLHDL 3.1 10/02/2021 1139   LDLCALC 81 06/03/2023 0837   LABVLDL 16 06/03/2023 0837    Lab Results  Component Value Date   HGBA1C 5.4 (A) 04/08/2023     Assessment and plan:  Assessment & Plan Insomnia Chronic insomnia with difficulty maintaining sleep. Previous  medications caused sedation and fatigue. Cetirizine  aids sleep but causes early awakening. Discussed sedative effects of hydroxyzine  and gabapentin , emphasizing fall risk. - Prescribe hydroxyzine  10 mg at bedtime as needed for sleep. - Send prescription to [facility]. - Evaluate effectiveness of hydroxyzine  10 mg after one week; consider increasing to 25 mg  if needed.        Meds ordered this encounter  Medications   hydrOXYzine  (ATARAX ) 10 MG tablet    Sig: Take 1 tablet (10 mg total) by mouth at bedtime as needed.    Dispense:  90 tablet    Refill:  1    Follow Up Instructions: Keep previously scheduled appointment   I discussed the assessment and treatment plan with the patient. The patient was provided an opportunity to ask questions and all were answered. The patient agreed with the plan and demonstrated an understanding of the instructions.   The patient was advised to call back or seek an in-person evaluation if the symptoms worsen or if the condition fails to improve as anticipated.     I provided 14 minutes total of Telehealth time during this encounter including median intraservice time, reviewing previous notes, investigations, ordering medications, medical decision making, coordinating care and patient verbalized understanding at the end of the visit.     Corrina Sabin, MD, FAAFP. [facility] [place], [place] [phone]   10/30/2023, 1:36 PM

## 2023-11-01 ENCOUNTER — Other Ambulatory Visit: Payer: Self-pay | Admitting: Critical Care Medicine

## 2023-11-11 ENCOUNTER — Other Ambulatory Visit: Payer: Self-pay

## 2023-11-11 ENCOUNTER — Telehealth: Payer: Self-pay

## 2023-11-11 ENCOUNTER — Ambulatory Visit: Attending: Family Medicine

## 2023-11-11 VITALS — Ht 63.0 in | Wt 180.0 lb

## 2023-11-11 DIAGNOSIS — Z0000 Encounter for general adult medical examination without abnormal findings: Secondary | ICD-10-CM

## 2023-11-11 MED ORDER — POTASSIUM CHLORIDE CRYS ER 10 MEQ PO TBCR: 10.0000 meq | EXTENDED_RELEASE_TABLET | Freq: Every day | ORAL | 1 refills | Status: DC

## 2023-11-11 NOTE — Telephone Encounter (Signed)
 Patient has been informed of medication being sent to pharmacy.

## 2023-11-11 NOTE — Telephone Encounter (Signed)
 Patient stated during her AWV today, that she has not received her two rx from [facility].  Patient stated that she needs her potassium, hydroxyzine  and something for severe cramping in her legs.  Please advise.  Alisia Vanengen N. Tomie, LPN [facility] Annual Wellness Team Direct Dial: [phone]

## 2023-11-11 NOTE — Patient Instructions (Signed)
 Cheryl Prince,  Thank you for taking the time for your Medicare Wellness Visit. I appreciate your continued commitment to your health goals. Please review the care plan we discussed, and feel free to reach out if I can assist you further.  Medicare recommends these wellness visits once per year to help you and your care team stay ahead of potential health issues. These visits are designed to focus on prevention, allowing your provider to concentrate on managing your acute and chronic conditions during your regular appointments.  Please note that Annual Wellness Visits do not include a physical exam. Some assessments may be limited, especially if the visit was conducted virtually. If needed, we may recommend a separate in-person follow-up with your provider.  Ongoing Care Seeing your primary care provider every 3 to 6 months helps us  monitor your health and provide consistent, personalized care.   Referrals If a referral was made during today's visit and you haven't received any updates within two weeks, please contact the referred provider directly to check on the status.  Recommended Screenings:  Health Maintenance  Topic Date Due   Pneumococcal Vaccine for age over 13 (1 of 2 - PCV) 04/07/2024*   Breast Cancer Screening  09/18/2024   Medicare Annual Wellness Visit  11/10/2024   DTaP/Tdap/Td vaccine (3 - Td or Tdap) 05/26/2028   Hepatitis C Screening  Completed   HPV Vaccine  Aged Out   Meningitis B Vaccine  Aged Out   Flu Shot  Discontinued   DEXA scan (bone density measurement)  Discontinued   COVID-19 Vaccine  Discontinued   Cologuard (Stool DNA test)  Discontinued   Zoster (Shingles) Vaccine  Discontinued  *Topic was postponed. The date shown is not the original due date.       11/11/2023    9:05 AM  Advanced Directives  Does Patient Have a Medical Advance Directive? No  Would patient like information on creating a medical advance directive? No - Patient declined   Advance  Care Planning is important because it: Ensures you receive medical care that aligns with your values, goals, and preferences. Provides guidance to your family and loved ones, reducing the emotional burden of decision-making during critical moments.  Vision: Annual vision screenings are recommended for early detection of glaucoma, cataracts, and diabetic retinopathy. These exams can also reveal signs of chronic conditions such as diabetes and high blood pressure.  Dental: Annual dental screenings help detect early signs of oral cancer, gum disease, and other conditions linked to overall health, including heart disease and diabetes.  Please see the attached documents for additional preventive care recommendations.

## 2023-11-11 NOTE — Progress Notes (Signed)
 Because this visit was a virtual/telehealth visit,  certain criteria was not obtained, such a blood pressure, CBG if applicable, and timed get up and go. Any medications not marked as taking were not mentioned during the medication reconciliation part of the visit. Any vitals not documented were not able to be obtained due to this being a telehealth visit or patient was unable to self-report a recent blood pressure reading due to a lack of equipment at home via telehealth. Vitals that have been documented are verbally provided by the patient.   Subjective:   Cheryl Prince is a 68 y.o. who presents for a Medicare Wellness preventive visit.  As a reminder, Annual Wellness Visits don't include a physical exam, and some assessments may be limited, especially if this visit is performed virtually. We may recommend an in-person follow-up visit with your provider if needed.  Visit Complete: Virtual I connected with  Cheryl Prince on 11/11/23 by a audio enabled telemedicine application and verified that I am speaking with the correct person using two identifiers.  Patient Location: Home  Provider Location: Home Office  I discussed the limitations of evaluation and management by telemedicine. The patient expressed understanding and agreed to proceed.  Vital Signs: Because this visit was a virtual/telehealth visit, some criteria may be missing or patient reported. Any vitals not documented were not able to be obtained and vitals that have been documented are patient reported.  VideoDeclined- This patient declined Librarian, academic. Therefore the visit was completed with audio only.  Persons Participating in Visit: Patient.  AWV Questionnaire: No: Patient Medicare AWV questionnaire was not completed prior to this visit.  Cardiac Risk Factors include: advanced age (>2men, >82 women);hypertension;family history of premature cardiovascular  disease;obesity (BMI >30kg/m2)     Objective:    Today's Vitals   11/11/23 0900  Weight: 180 lb (81.6 kg)  Height: 5' 3 (1.6 m)  PainSc: 5   PainLoc: Knee   Body mass index is 31.89 kg/m.     11/11/2023    9:05 AM 05/01/2022   11:34 AM 12/25/2021   12:03 PM 11/29/2021    8:00 PM 11/29/2021    9:12 AM 11/20/2021   11:16 AM 08/17/2020    1:47 PM  Advanced Directives  Does Patient Have a Medical Advance Directive? No No Yes No No No No  Does patient want to make changes to medical advance directive?   Yes (MAU/Ambulatory/Procedural Areas - Information given)      Would patient like information on creating a medical advance directive? No - Patient declined   Yes (Inpatient - patient requests chaplain consult to create a medical advance directive)  No - Patient declined No - Patient declined    Current Medications (verified) Outpatient Encounter Medications as of 11/11/2023  Medication Sig   acetaminophen  (TYLENOL ) 325 MG tablet Take 1 tablet (325 mg total) by mouth every 8 (eight) hours. (Patient taking differently: Take 650 mg by mouth 3 (three) times daily as needed (pain).)   albuterol  (VENTOLIN  HFA) 108 (90 Base) MCG/ACT inhaler Inhale 2 puffs into the lungs every 6 (six) hours as needed for wheezing or shortness of breath.   Calcium Carbonate Antacid (TUMS PO) Take 2 tablets by mouth 3 (three) times daily as needed (reflux).   CALCIUM PO Take 1 tablet by mouth in the morning and at bedtime.   cetirizine  (ZYRTEC ) 10 MG tablet Take 10 mg by mouth daily.   Cholecalciferol  (VITAMIN D3) 50 MCG (2000  UT) CAPS Take 2,000 Units by mouth daily.   doxycycline  (VIBRAMYCIN ) 100 MG capsule Take 1 capsule (100 mg total) by mouth 2 (two) times daily.   fluticasone  (FLONASE ) 50 MCG/ACT nasal spray Place 1 spray into both nostrils 3 (three) times daily as needed for allergies or rhinitis.   gabapentin  (NEURONTIN ) 300 MG capsule Take 1 capsule (300 mg total) by mouth 2 (two) times daily.    hydrOXYzine  (ATARAX ) 10 MG tablet Take 1 tablet (10 mg total) by mouth at bedtime as needed.   losartan -hydrochlorothiazide  (HYZAAR) 50-12.5 MG tablet TAKE 1 TABLET BY MOUTH DAILY   montelukast  (SINGULAIR ) 10 MG tablet TAKE 1 TABLET BY MOUTH DAILY AS  NEEDED FOR WHEEZING   Multiple Vitamin (MULTIVITAMIN) tablet Take 1 tablet by mouth daily.   pantoprazole  (PROTONIX ) 40 MG tablet TAKE 1 TABLET BY MOUTH DAILY   potassium chloride  (KLOR-CON  M) 10 MEQ tablet Take 1 tablet (10 mEq total) by mouth daily.   No facility-administered encounter medications on file as of 11/11/2023.    Allergies (verified) Morphine  and codeine, Penicillins, Pine, Shellfish allergy, Lactose intolerance (gi), Miralax [polyethylene glycol], Nickel, Other, Grapeseed extract [nutritional supplements], Latex, Red dye #40 (allura red), Sulfites, and Wheat   History: Past Medical History:  Diagnosis Date   Allergy    Anemia    Anxiety    Arthritis    Asthma    borderline bronchitis   Bipolar I disorder (HCC) 07/26/2020   Bronchitis    Deep venous thrombosis (HCC) 12/26/2021   GERD (gastroesophageal reflux disease)    Goiter, toxic diffuse 05/30/2016   Hypertension    S/P tonsillectomy 05/30/2016   Sleep apnea    history of, MD tookpt. off machine 7 yrs, ago   Past Surgical History:  Procedure Laterality Date   ABDOMINAL HYSTERECTOMY     adnoides     BREAST SURGERY     CHOLECYSTECTOMY     COLONOSCOPY     DILATION AND CURETTAGE OF UTERUS     GASTRIC BYPASS  1998   IUD REMOVAL     TONSILLECTOMY     TOTAL KNEE ARTHROPLASTY Left 09/22/2018   TOTAL KNEE ARTHROPLASTY Left 09/22/2018   Procedure: LEFT TOTAL KNEE ARTHROPLASTY;  Surgeon: Addie Cordella Hamilton, MD;  Location: [facility] OR;  Service: Orthopedics;  Laterality: Left;   TOTAL KNEE ARTHROPLASTY Right 11/29/2021   Procedure: RIGHT TOTAL KNEE ARTHROPLASTY;  Surgeon: Addie Cordella Hamilton, MD;  Location: [facility] OR;  Service: Orthopedics;  Laterality: Right;   Family  History  Problem Relation Age of Onset   Hypertension Father    Diabetes Father    Lupus Daughter    Colon cancer Maternal Aunt    Colon cancer Maternal Uncle        4 mat uncles dx colon ca   Esophageal cancer Neg Hx    Rectal cancer Neg Hx    Stomach cancer Neg Hx    Social History   Socioeconomic History   Marital status: Single    Spouse name: Not on file   Number of children: Not on file   Years of education: Not on file   Highest education level: Not on file  Occupational History   Not on file  Tobacco Use   Smoking status: Former    Current packs/day: 0.00    Average packs/day: 1.5 packs/day for 18.0 years (27.0 ttl pk-yrs)    Types: Cigarettes    Start date: 09/17/1966    Quit date: 09/16/1984    Years  since quitting: 39.1   Smokeless tobacco: Never   Tobacco comments:    quit 1986  Vaping Use   Vaping status: Never Used  Substance and Sexual Activity   Alcohol use: Yes    Comment: social   Drug use: No   Sexual activity: Not on file  Other Topics Concern   Not on file  Social History Narrative   Not on file   Social Drivers of Health   Financial Resource Strain: Low Risk  (11/11/2023)   Overall Financial Resource Strain (CARDIA)    Difficulty of Paying Living Expenses: Not hard at all  Food Insecurity: No Food Insecurity (11/11/2023)   Hunger Vital Sign    Worried About Running Out of Food in the Last Year: Never true    Ran Out of Food in the Last Year: Never true  Transportation Needs: No Transportation Needs (11/11/2023)   PRAPARE - Administrator, Civil Service (Medical): No    Lack of Transportation (Non-Medical): No  Physical Activity: Sufficiently Active (11/11/2023)   Exercise Vital Sign    Days of Exercise per Week: 3 days    Minutes of Exercise per Session: 60 min  Stress: No Stress Concern Present (11/11/2023)   [employer] of Occupational Health - Occupational Stress Questionnaire    Feeling of Stress: Not at all  Social  Connections: Moderately Integrated (11/11/2023)   Social Connection and Isolation Panel    Frequency of Communication with Friends and Family: Three times a week    Frequency of Social Gatherings with Friends and Family: Twice a week    Attends Religious Services: 1 to 4 times per year    Active Member of [employer] or Organizations: Yes    Attends Banker Meetings: 1 to 4 times per year    Marital Status: Divorced    Tobacco Counseling Counseling given: Not Answered Tobacco comments: quit 1986    Clinical Intake:  Pre-visit preparation completed: Yes  Pain : 0-10 Pain Score: 5  (GABAPENTIN , TYLENOL ) Pain Type: Chronic pain Pain Location: Knee Pain Orientation: Left     BMI - recorded: 31.89 Nutritional Status: BMI > 30  Obese Nutritional Risks: None Diabetes: No  Lab Results  Component Value Date   HGBA1C 5.4 (A) 04/08/2023   HGBA1C 5.7 (H) 10/02/2021   HGBA1C 5.8 (A) 12/23/2018     How often do you need to have someone help you when you read instructions, pamphlets, or other written materials from your doctor or pharmacy?: 1 - Never  Interpreter Needed?: No  Information entered by :: Roz Fuller, LPN.   Activities of Daily Living     11/11/2023    9:05 AM  In your present state of health, do you have any difficulty performing the following activities:  Hearing? 0  Vision? 0  Difficulty concentrating or making decisions? 0  Walking or climbing stairs? 0  Dressing or bathing? 0  Doing errands, shopping? 0  Preparing Food and eating ? N  Using the Toilet? N  In the past six months, have you accidently leaked urine? N  Do you have problems with loss of bowel control? N  Managing your Medications? N  Managing your Finances? N  Housekeeping or managing your Housekeeping? N    Patient Care Team: Delbert Clam, MD as PCP - General (Family Medicine)  I have updated your Care Teams any recent Medical Services you may have received from other  providers in the past year.  Assessment:   This is a routine wellness examination for [place].  Hearing/Vision screen Hearing Screening - Comments:: Adequate hearing. Vision Screening - Comments:: Adequate vision with reading eyeglasses.  Up to date with eye exam with [facility]   Goals Addressed             This Visit's Progress    11/11/2023       Continue with the bowl league once a week. Continue to work out at the [employer] and aqua therapy.       Depression Screen     11/11/2023    9:06 AM 05/27/2023    1:56 PM 04/08/2023    2:37 PM 11/05/2022   10:28 AM 09/03/2022   11:01 AM 05/01/2022   11:36 AM 03/28/2022   10:08 AM  PHQ 2/9 Scores  PHQ - 2 Score 0 0 0 0 0 0 0  PHQ- 9 Score 3 3 0    2    Fall Risk     11/11/2023    9:04 AM 05/27/2023    2:09 PM 04/08/2023    2:37 PM 02/11/2023    2:41 PM 11/05/2022   10:28 AM  Fall Risk   Falls in the past year? 1 0 0 0 1  Number falls in past yr: 1 0 0 0 0  Injury with Fall? 0 0 0 0 0  Comment slipped while bowling, getting out the pool      Risk for fall due to : History of fall(s);Impaired balance/gait;Orthopedic patient No Fall Risks No Fall Risks No Fall Risks No Fall Risks  Follow up Falls evaluation completed;Education provided Falls evaluation completed Falls evaluation completed Falls evaluation completed     MEDICARE RISK AT HOME:  Medicare Risk at Home Any stairs in or around the home?: Yes If so, are there any without handrails?: No Home free of loose throw rugs in walkways, pet beds, electrical cords, etc?: Yes Adequate lighting in your home to reduce risk of falls?: Yes Life alert?: No Use of a cane, walker or w/c?: No Grab bars in the bathroom?: No Shower chair or bench in shower?: Yes Elevated toilet seat or a handicapped toilet?: No  TIMED UP AND GO:  Was the test performed?  No  Cognitive Function: Declined/Normal: No cognitive concerns noted by patient or family. Patient alert, oriented, able to  answer questions appropriately and recall recent events. No signs of memory loss or confusion.    11/11/2023    9:06 AM  MMSE - Mini Mental State Exam  Not completed: Unable to complete        11/11/2023    9:07 AM 05/01/2022   11:39 AM  6CIT Screen  What Year? 0 points 0 points  What month? 0 points 0 points  What time? 0 points 0 points  Count back from 20 0 points 0 points  Months in reverse 0 points 0 points  Repeat phrase 0 points 4 points  Total Score 0 points 4 points    Immunizations Immunization History  Administered Date(s) Administered   Moderna SARS-COV2 Booster Vaccination 03/08/2020   Moderna Sars-Covid-2 Vaccination 05/11/2019, 06/08/2019   PPD Test 06/18/2017   Tdap 05/27/2018, 05/27/2018   Zoster Recombinant(Shingrix) 05/08/2010    Screening Tests Health Maintenance  Topic Date Due   Medicare Annual Wellness (AWV)  05/01/2023   Pneumococcal Vaccine: 50+ Years (1 of 2 - PCV) 04/07/2024 (Originally 07/15/1974)   Mammogram  09/18/2024   DTaP/Tdap/Td (3 - Td or Tdap) 05/26/2028  Hepatitis C Screening  Completed   HPV VACCINES  Aged Out   Meningococcal B Vaccine  Aged Out   Influenza Vaccine  Discontinued   DEXA SCAN  Discontinued   COVID-19 Vaccine  Discontinued   Fecal DNA (Cologuard)  Discontinued   Zoster Vaccines- Shingrix  Discontinued    Health Maintenance Items Addressed: Yes Patient is currently up to date with care gaps.  Additional Screening:  Vision Screening: Recommended annual ophthalmology exams for early detection of glaucoma and other disorders of the eye. Is the patient up to date with their annual eye exam?  Yes  Who is the provider or what is the name of the office in which the patient attends annual eye exams? [facility]  Dental Screening: Recommended annual dental exams for proper oral hygiene  Community Resource Referral / Chronic Care Management: CRR required this visit?  No   CCM required this visit?   No   Plan:    I have personally reviewed and noted the following in the patient's chart:   Medical and social history Use of alcohol, tobacco or illicit drugs  Current medications and supplements including opioid prescriptions. Patient is not currently taking opioid prescriptions. Functional ability and status Nutritional status Physical activity Advanced directives List of other physicians Hospitalizations, surgeries, and ER visits in previous 12 months Vitals Screenings to include cognitive, depression, and falls Referrals and appointments  In addition, I have reviewed and discussed with patient certain preventive protocols, quality metrics, and best practice recommendations. A written personalized care plan for preventive services as well as general preventive health recommendations were provided to patient.   Roz LOISE Fuller, LPN   [id number]   After Visit Summary: (MyChart) Due to this being a telephonic visit, the after visit summary with patients personalized plan was offered to patient via MyChart   Notes: Nothing significant to report at this time.

## 2023-12-01 ENCOUNTER — Telehealth: Payer: Self-pay | Admitting: Family Medicine

## 2023-12-01 NOTE — Telephone Encounter (Signed)
 contacted pt she request ot cancel appt due to [place] out of [place]

## 2023-12-02 ENCOUNTER — Ambulatory Visit: Admitting: Family Medicine

## 2023-12-15 ENCOUNTER — Telehealth: Payer: Self-pay | Admitting: Family Medicine

## 2023-12-15 NOTE — Telephone Encounter (Signed)
 Copied from CRM #[id number]. Topic: Clinical - Medication Question >> Dec 15, 2023  9:06 AM [name] wrote:  Reason for CRM: patient called to say that her insurance will now accept and pay for wegovy  so she was needing a new rx because the one from before is expired now... can't schedule for November appointment because pt is moving. Please advise her if rx will be filled without appt

## 2023-12-16 NOTE — Telephone Encounter (Signed)
 Her last A1c was normal at 5.4 hence Wegovy  will not be covered.  If she would like referral to weight management clinic I am happy to place that referral for her.

## 2023-12-16 NOTE — Telephone Encounter (Signed)
 Routing to PCP for review.

## 2023-12-17 NOTE — Telephone Encounter (Signed)
 Patient was called and she states that she would like the referral placed but she is currently in [place]  and will call once she returns.

## 2024-01-17 ENCOUNTER — Other Ambulatory Visit: Payer: Self-pay | Admitting: Family Medicine

## 2024-03-24 ENCOUNTER — Other Ambulatory Visit: Payer: Self-pay | Admitting: Family Medicine

## 2024-03-24 DIAGNOSIS — Z760 Encounter for issue of repeat prescription: Secondary | ICD-10-CM

## 2024-11-16 ENCOUNTER — Ambulatory Visit
